# Patient Record
Sex: Female | Born: 1950 | Race: Black or African American | Hispanic: No | Smoking: Never smoker
Health system: Southern US, Community
[De-identification: ages and names within clinical notes are randomized; demographics above are authoritative.]

## PROBLEM LIST (undated history)

## (undated) DIAGNOSIS — K5902 Outlet dysfunction constipation: Secondary | ICD-10-CM

## (undated) DIAGNOSIS — E785 Hyperlipidemia, unspecified: Secondary | ICD-10-CM

## (undated) DIAGNOSIS — I10 Essential (primary) hypertension: Secondary | ICD-10-CM

## (undated) DIAGNOSIS — M1990 Unspecified osteoarthritis, unspecified site: Secondary | ICD-10-CM

## (undated) DIAGNOSIS — K6289 Other specified diseases of anus and rectum: Secondary | ICD-10-CM

## (undated) HISTORY — DX: Essential (primary) hypertension: I10

## (undated) HISTORY — DX: Unspecified osteoarthritis, unspecified site: M19.90

## (undated) HISTORY — DX: Other specified diseases of anus and rectum: K62.89

## (undated) HISTORY — DX: Hyperlipidemia, unspecified: E78.5

## (undated) HISTORY — DX: Outlet dysfunction constipation: K59.02

## (undated) HISTORY — PX: POLYPECTOMY: SHX149

## (undated) HISTORY — PX: COLONOSCOPY: SHX174

---

## 1995-09-27 HISTORY — PX: ABDOMINAL HYSTERECTOMY: SHX81

## 2000-09-26 HISTORY — PX: BREAST SURGERY: SHX581

## 2011-03-27 DEATH — deceased

## 2017-09-24 DIAGNOSIS — E860 Dehydration: Secondary | ICD-10-CM | POA: Diagnosis not present

## 2017-09-24 DIAGNOSIS — E785 Hyperlipidemia, unspecified: Secondary | ICD-10-CM | POA: Diagnosis not present

## 2017-09-24 DIAGNOSIS — I10 Essential (primary) hypertension: Secondary | ICD-10-CM | POA: Diagnosis not present

## 2017-09-24 DIAGNOSIS — R0789 Other chest pain: Secondary | ICD-10-CM | POA: Diagnosis not present

## 2017-09-24 DIAGNOSIS — E876 Hypokalemia: Secondary | ICD-10-CM | POA: Diagnosis not present

## 2017-09-24 DIAGNOSIS — R079 Chest pain, unspecified: Secondary | ICD-10-CM | POA: Diagnosis not present

## 2017-10-02 DIAGNOSIS — I10 Essential (primary) hypertension: Secondary | ICD-10-CM | POA: Diagnosis not present

## 2017-10-02 DIAGNOSIS — Z136 Encounter for screening for cardiovascular disorders: Secondary | ICD-10-CM | POA: Diagnosis not present

## 2017-10-02 DIAGNOSIS — E785 Hyperlipidemia, unspecified: Secondary | ICD-10-CM | POA: Diagnosis not present

## 2017-10-02 DIAGNOSIS — I209 Angina pectoris, unspecified: Secondary | ICD-10-CM | POA: Diagnosis not present

## 2017-10-02 DIAGNOSIS — R911 Solitary pulmonary nodule: Secondary | ICD-10-CM | POA: Diagnosis not present

## 2017-10-09 DIAGNOSIS — I10 Essential (primary) hypertension: Secondary | ICD-10-CM | POA: Diagnosis not present

## 2017-10-09 DIAGNOSIS — I209 Angina pectoris, unspecified: Secondary | ICD-10-CM | POA: Diagnosis not present

## 2017-10-09 DIAGNOSIS — R911 Solitary pulmonary nodule: Secondary | ICD-10-CM | POA: Diagnosis not present

## 2017-10-10 DIAGNOSIS — Z136 Encounter for screening for cardiovascular disorders: Secondary | ICD-10-CM | POA: Diagnosis not present

## 2017-10-10 DIAGNOSIS — I209 Angina pectoris, unspecified: Secondary | ICD-10-CM | POA: Diagnosis not present

## 2017-10-10 DIAGNOSIS — I10 Essential (primary) hypertension: Secondary | ICD-10-CM | POA: Diagnosis not present

## 2017-10-18 DIAGNOSIS — R918 Other nonspecific abnormal finding of lung field: Secondary | ICD-10-CM | POA: Diagnosis not present

## 2017-10-18 DIAGNOSIS — R05 Cough: Secondary | ICD-10-CM | POA: Diagnosis not present

## 2017-10-22 DIAGNOSIS — S91342A Puncture wound with foreign body, left foot, initial encounter: Secondary | ICD-10-CM | POA: Diagnosis not present

## 2017-11-01 DIAGNOSIS — R911 Solitary pulmonary nodule: Secondary | ICD-10-CM | POA: Diagnosis not present

## 2017-11-01 DIAGNOSIS — E785 Hyperlipidemia, unspecified: Secondary | ICD-10-CM | POA: Diagnosis not present

## 2017-11-01 DIAGNOSIS — I340 Nonrheumatic mitral (valve) insufficiency: Secondary | ICD-10-CM | POA: Diagnosis not present

## 2017-11-01 DIAGNOSIS — I10 Essential (primary) hypertension: Secondary | ICD-10-CM | POA: Diagnosis not present

## 2017-12-22 ENCOUNTER — Ambulatory Visit (INDEPENDENT_AMBULATORY_CARE_PROVIDER_SITE_OTHER): Payer: Medicare Other | Admitting: Physician Assistant

## 2017-12-22 ENCOUNTER — Emergency Department (HOSPITAL_COMMUNITY)
Admission: EM | Admit: 2017-12-22 | Discharge: 2017-12-22 | Disposition: A | Discharge status: Home / Self Care | Payer: Medicare Other | Attending: Emergency Medicine | Admitting: Emergency Medicine

## 2017-12-22 ENCOUNTER — Emergency Department (HOSPITAL_COMMUNITY): Payer: Medicare Other

## 2017-12-22 ENCOUNTER — Encounter: Payer: Self-pay | Admitting: Physician Assistant

## 2017-12-22 ENCOUNTER — Other Ambulatory Visit: Payer: Self-pay

## 2017-12-22 ENCOUNTER — Encounter (HOSPITAL_COMMUNITY): Payer: Self-pay

## 2017-12-22 VITALS — BP 176/100 | HR 62 | Temp 97.7°F | Resp 16

## 2017-12-22 DIAGNOSIS — I10 Essential (primary) hypertension: Secondary | ICD-10-CM | POA: Diagnosis not present

## 2017-12-22 DIAGNOSIS — R001 Bradycardia, unspecified: Secondary | ICD-10-CM | POA: Diagnosis not present

## 2017-12-22 DIAGNOSIS — R030 Elevated blood-pressure reading, without diagnosis of hypertension: Secondary | ICD-10-CM | POA: Diagnosis not present

## 2017-12-22 DIAGNOSIS — R51 Headache: Secondary | ICD-10-CM

## 2017-12-22 DIAGNOSIS — R42 Dizziness and giddiness: Secondary | ICD-10-CM | POA: Diagnosis not present

## 2017-12-22 DIAGNOSIS — R110 Nausea: Secondary | ICD-10-CM | POA: Insufficient documentation

## 2017-12-22 DIAGNOSIS — Z79899 Other long term (current) drug therapy: Secondary | ICD-10-CM | POA: Diagnosis not present

## 2017-12-22 DIAGNOSIS — R519 Headache, unspecified: Secondary | ICD-10-CM

## 2017-12-22 DIAGNOSIS — R531 Weakness: Secondary | ICD-10-CM | POA: Diagnosis not present

## 2017-12-22 DIAGNOSIS — R404 Transient alteration of awareness: Secondary | ICD-10-CM | POA: Diagnosis not present

## 2017-12-22 DIAGNOSIS — R55 Syncope and collapse: Secondary | ICD-10-CM | POA: Diagnosis not present

## 2017-12-22 LAB — URINALYSIS, ROUTINE W REFLEX MICROSCOPIC
Bilirubin Urine: NEGATIVE
GLUCOSE, UA: NEGATIVE mg/dL
HGB URINE DIPSTICK: NEGATIVE
Ketones, ur: NEGATIVE mg/dL
Leukocytes, UA: NEGATIVE
Nitrite: NEGATIVE
PH: 8 (ref 5.0–8.0)
Protein, ur: NEGATIVE mg/dL
SPECIFIC GRAVITY, URINE: 1.005 (ref 1.005–1.030)

## 2017-12-22 LAB — CBC WITH DIFFERENTIAL/PLATELET
BASOS PCT: 0 %
Basophils Absolute: 0 10*3/uL (ref 0.0–0.1)
Eosinophils Absolute: 0.1 10*3/uL (ref 0.0–0.7)
Eosinophils Relative: 1 %
HEMATOCRIT: 38.8 % (ref 36.0–46.0)
Hemoglobin: 12.9 g/dL (ref 12.0–15.0)
LYMPHS ABS: 2.2 10*3/uL (ref 0.7–4.0)
Lymphocytes Relative: 31 %
MCH: 28.1 pg (ref 26.0–34.0)
MCHC: 33.2 g/dL (ref 30.0–36.0)
MCV: 84.5 fL (ref 78.0–100.0)
MONO ABS: 0.3 10*3/uL (ref 0.1–1.0)
MONOS PCT: 4 %
Neutro Abs: 4.4 10*3/uL (ref 1.7–7.7)
Neutrophils Relative %: 64 %
Platelets: 208 10*3/uL (ref 150–400)
RBC: 4.59 MIL/uL (ref 3.87–5.11)
RDW: 13.8 % (ref 11.5–15.5)
WBC: 7 10*3/uL (ref 4.0–10.5)

## 2017-12-22 LAB — BASIC METABOLIC PANEL
Anion gap: 10 (ref 5–15)
BUN: 12 mg/dL (ref 6–20)
CALCIUM: 9.8 mg/dL (ref 8.9–10.3)
CHLORIDE: 107 mmol/L (ref 101–111)
CO2: 24 mmol/L (ref 22–32)
CREATININE: 1.05 mg/dL — AB (ref 0.44–1.00)
GFR calc Af Amer: >60 mL/min (ref >60)
GFR calc non Af Amer: 54 mL/min — ABNORMAL LOW (ref >60)
GLUCOSE: 95 mg/dL (ref 65–99)
Potassium: 4 mmol/L (ref 3.5–5.1)
Sodium: 141 mmol/L (ref 135–145)

## 2017-12-22 LAB — POCT CBC
Granulocyte percent: 57.8 % (ref 37–80)
HCT, POC: 38.1 % (ref 37.7–47.9)
Hemoglobin: 12.7 g/dL (ref 12.2–16.2)
Lymph, poc: 2.6 (ref 0.6–3.4)
MCH, POC: 28.1 pg (ref 27–31.2)
MCHC: 33.2 g/dL (ref 31.8–35.4)
MCV: 84.4 fL (ref 80–97)
MID (cbc): 0.4 (ref 0–0.9)
MPV: 9 fL (ref 0–99.8)
POC Granulocyte: 4.1 (ref 2–6.9)
POC LYMPH PERCENT: 36.9 %L (ref 10–50)
POC MID %: 5.3 %M (ref 0–12)
Platelet Count, POC: 195 10*3/uL (ref 142–424)
RBC: 4.52 M/uL (ref 4.04–5.48)
RDW, POC: 14 %
WBC: 7.1 10*3/uL (ref 4.6–10.2)

## 2017-12-22 LAB — GLUCOSE, POCT (MANUAL RESULT ENTRY): POC Glucose: 87 mg/dl (ref 70–99)

## 2017-12-22 LAB — TROPONIN I: Troponin I: <0.03 ng/mL (ref <0.03)

## 2017-12-22 MED ORDER — MECLIZINE HCL 25 MG PO TABS
25.0000 mg | ORAL_TABLET | Freq: Once | ORAL | Status: AC
Start: 2017-12-22 — End: 2017-12-22
  Administered 2017-12-22: 25 mg via ORAL
  Filled 2017-12-22: qty 1

## 2017-12-22 MED ORDER — LOSARTAN POTASSIUM 50 MG PO TABS: 50.0000 mg | ORAL_TABLET | Freq: Every day | ORAL | 0 refills | Status: DC

## 2017-12-22 MED ORDER — SODIUM CHLORIDE 0.9 % IV BOLUS
1000.0000 mL | Freq: Once | INTRAVENOUS | Status: AC
  Administered 2017-12-22: 1000 mL via INTRAVENOUS

## 2017-12-22 MED ORDER — SODIUM CHLORIDE 0.9 % IV SOLN: INTRAVENOUS | Status: DC

## 2017-12-22 MED ORDER — ONDANSETRON HCL 4 MG/2ML IJ SOLN
4.0000 mg | Freq: Once | INTRAMUSCULAR | Status: AC
  Administered 2017-12-22: 4 mg via INTRAVENOUS
  Filled 2017-12-22: qty 2

## 2017-12-22 MED ORDER — CLONIDINE HCL 0.1 MG PO TABS: 0.1000 mg | ORAL_TABLET | Freq: Every day | ORAL | 0 refills | Status: DC | PRN

## 2017-12-22 MED ORDER — MECLIZINE HCL 25 MG PO TABS: 25.0000 mg | ORAL_TABLET | Freq: Three times a day (TID) | ORAL | 0 refills | Status: DC | PRN

## 2017-12-22 MED ORDER — ONDANSETRON 4 MG PO TBDP: 4.0000 mg | ORAL_TABLET | Freq: Three times a day (TID) | ORAL | 0 refills | Status: DC | PRN

## 2017-12-22 NOTE — Patient Instructions (Signed)
     IF you received an x-ray today, you will receive an invoice from [facility]. Please contact [facility] at [phone] with questions or concerns regarding your invoice.   IF you received labwork today, you will receive an invoice from [facility]. Please contact [facility] at [phone] with questions or concerns regarding your invoice.   Our billing staff will not be able to assist you with questions regarding bills from these companies.  You will be contacted with the lab results as soon as they are available. The fastest way to get your results is to activate your My Chart account. Instructions are located on the last page of this paperwork. If you have not heard from us regarding the results in 2 weeks, please contact this office.     

## 2017-12-22 NOTE — ED Provider Notes (Signed)
[facility] EMERGENCY DEPARTMENT Provider Note   CSN: [id number] Arrival date & time: 12/22/17  1157     History   Chief Complaint Chief Complaint  Patient presents with  . Loss of Consciousness    HPI Nancy Christensen is a 67 y.o. female.  Pt presents to the ED today with dizziness and possible syncopal episode.  She said she had an episode on Tuesday, March 25 of severe dizziness.  She had to lie on the ground and not move.  She said it lasted about 15 minutes then finally resolved.  She was fine Wed and Thursday.  This morning, she woke up in the night (around 0230) and felt the same sx.  She had nausea with it. She said it was so severe that she started praying.  She is not sure if she fell back asleep, or if she passed out.  She woke up a few hours later still feeling dizzy, but not as bad.  She went to urgent care who sent her here.  Dizziness is improved, but still there a little.  Nausea is also still there a little.  She denies f/c.  She denies any difficulty with her speech.  She has recently moved here from [place], [place] and needs refills on her meds.     Past Medical History:  Diagnosis Date  . Hyperlipidemia   . Hypertension     There are no active problems to display for this patient.     OB History   None      Home Medications    Prior to Admission medications   Medication Sig Start Date End Date Taking? Authorizing Provider  atorvastatin (LIPITOR) 40 MG tablet Take 40 mg by mouth daily.    [provider]  cloNIDine (CATAPRES) 0.1 MG tablet Take 1 tablet (0.1 mg total) by mouth daily as needed (SBP more than 170 or DBP > 90). 12/22/17   Isla Pence, MD  losartan (COZAAR) 50 MG tablet Take 1 tablet (50 mg total) by mouth daily. 12/22/17   Isla Pence, MD  meclizine (ANTIVERT) 25 MG tablet Take 1 tablet (25 mg total) by mouth 3 (three) times daily as needed for dizziness. 12/22/17   Isla Pence, MD  ondansetron (ZOFRAN  ODT) 4 MG disintegrating tablet Take 1 tablet (4 mg total) by mouth every 8 (eight) hours as needed. 12/22/17   Isla Pence, MD    Family History No family history on file.  Social History Social History   Tobacco Use  . Smoking status: Never Smoker  . Smokeless tobacco: Never Used  Substance Use Topics  . Alcohol use: Not on file  . Drug use: Not on file     Allergies   Patient has no known allergies.   Review of Systems Review of Systems  Gastrointestinal: Positive for nausea.  Neurological: Positive for dizziness.  All other systems reviewed and are negative.    Physical Exam Updated Vital Signs BP (!) 202/95 (BP Location: Left Arm)   Pulse 60   Temp 98.4 F (36.9 C) (Oral)   Resp 14   SpO2 100%   Physical Exam  Constitutional: She is oriented to person, place, and time. She appears well-developed and well-nourished.  HENT:  Head: Normocephalic and atraumatic.  Right Ear: External ear normal.  Left Ear: External ear normal.  Nose: Nose normal.  Mouth/Throat: Oropharynx is clear and moist.  Eyes: Pupils are equal, round, and reactive to light. Conjunctivae and EOM are normal.  Neck: Normal range of motion. Neck supple.  Cardiovascular: Normal rate, regular rhythm, normal heart sounds and intact distal pulses.  Pulmonary/Chest: Effort normal and breath sounds normal.  Abdominal: Soft. Bowel sounds are normal.  Musculoskeletal: Normal range of motion.  Neurological: She is alert and oriented to person, place, and time.  Skin: Skin is warm. Capillary refill takes less than 2 seconds.  Psychiatric: She has a normal mood and affect. Her behavior is normal. Judgment and thought content normal.  Nursing note and vitals reviewed.    ED Treatments / Results  Labs (all labs ordered are listed, but only abnormal results are displayed) Labs Reviewed  BASIC METABOLIC PANEL - Abnormal; Notable for the following components:      Result Value   Creatinine, Ser  1.05 (*)    GFR calc non Af Amer 54 (*)    All other components within normal limits  URINALYSIS, ROUTINE W REFLEX MICROSCOPIC - Abnormal; Notable for the following components:   Color, Urine COLORLESS (*)    All other components within normal limits  CBC WITH DIFFERENTIAL/PLATELET  TROPONIN I  CBG MONITORING, ED    EKG EKG Interpretation  Date/Time:  Friday December 22 2017 12:14:41 EDT Ventricular Rate:  56 PR Interval:    QRS Duration: 100 QT Interval:  461 QTC Calculation: 445 R Axis:   29 Text Interpretation:  Sinus rhythm Confirmed by Isla Pence [id number]) on 12/22/2017 12:18:56 PM   Radiology Dg Chest 2 View  Result Date: 12/22/2017 CLINICAL DATA:  Syncopal episode today.  Dizzy. EXAM: CHEST - 2 VIEW COMPARISON:  None. FINDINGS: The heart size and mediastinal contours are within normal limits. Both lungs are clear. The visualized skeletal structures are unremarkable. IMPRESSION: No active cardiopulmonary disease. Electronically Signed   By: San Morelle M.D.   On: 12/22/2017 12:54   Ct Head Wo Contrast  Result Date: 12/22/2017 CLINICAL DATA:  Headache and dizziness beginning 3 days ago. EXAM: CT HEAD WITHOUT CONTRAST TECHNIQUE: Contiguous axial images were obtained from the base of the skull through the vertex without intravenous contrast. COMPARISON:  None. FINDINGS: Brain: There is no evidence of acute cortically based infarct, intracranial hemorrhage, mass, midline shift, or extra-axial fluid collection. The ventricles and sulci are normal. Periventricular white matter hypodensities are nonspecific but compatible with mild chronic small vessel ischemic disease. Patchy hypodensities are also present in the basal ganglia bilaterally and suggest small vessel ischemia/lacunar infarcts of indeterminate acuity. Vascular: Calcified atherosclerosis at the skull base. No hyperdense vessel. Skull: No fracture focal osseous lesion. Sinuses/Orbits: Visualized paranasal sinuses and  mastoid air cells are clear. Orbits are unremarkable. Other: None. IMPRESSION: 1. No intracranial hemorrhage or acute large territory infarct. 2. Small vessel ischemia in the cerebral white matter and basal ganglia, of indeterminate acuity though most likely chronic. Electronically Signed   By: Logan Bores M.D.   On: 12/22/2017 13:52    Procedures Procedures (including critical care time)  Medications Ordered in ED Medications  sodium chloride 0.9 % bolus 1,000 mL (0 mLs Intravenous Stopped 12/22/17 1421)    And  0.9 %  sodium chloride infusion (has no administration in time range)  ondansetron (ZOFRAN) injection 4 mg (4 mg Intravenous Given 12/22/17 1319)  meclizine (ANTIVERT) tablet 25 mg (25 mg Oral Given 12/22/17 1319)     Initial Impression / Assessment and Plan / ED Course  I have reviewed the triage vital signs and the nursing notes.  Pertinent labs & imaging results that were available during  my care of the patient were reviewed by me and considered in my medical decision making (see chart for details).    Pt is feeling much better.  She is able to ambulate without problems.  She knows to establish primary care and to return if worse.    Final Clinical Impressions(s) / ED Diagnoses   Final diagnoses:  Vertigo  Essential hypertension    ED Discharge Orders        Ordered    losartan (COZAAR) 50 MG tablet  Daily     12/22/17 1506    ondansetron (ZOFRAN ODT) 4 MG disintegrating tablet  Every 8 hours PRN     12/22/17 1506    meclizine (ANTIVERT) 25 MG tablet  3 times daily PRN     12/22/17 1506    cloNIDine (CATAPRES) 0.1 MG tablet  Daily PRN     12/22/17 1506       Isla Pence, MD 12/22/17 1508

## 2017-12-22 NOTE — ED Triage Notes (Signed)
Pt presents to the ed with complaints of syncopal episode this morning at 0230.  Reports dizziness and blurred vision prior to.  Reports headache and dizziness now, alert and oriented. Elevated blood pressure with ems. Denies any injury from syncopal episode.

## 2017-12-22 NOTE — ED Notes (Signed)
Pt able to walk down hall , no dizziness

## 2017-12-22 NOTE — Progress Notes (Signed)
Nancy Christensen  MRN: [id number] DOB: [birth year]  PCP: Patient, No Pcp Per  Subjective:  Pt is a pleasant 67 year old female who presents to clinic for headache and dizziness x 7 hours.  She recently moved here from [place].  She woke up around 2:30 this morning because her phone woke her up. While she was looking at her phone she became extremely dizzy.  Felt like the room was spinning.  She wanted to get up to use the restroom however could not due to dizziness "felt like the bad was spinning".  This episode lasted for about 20 minutes. Endorses right sided headache along with nausea.  No vomiting.  She cannot recall if she fell back asleep or passed out.  "I assume I blacked out." It was sometime later and my head still hurt, "nagging, dull and constant". Her blood pressure today is 191/124. Recheck is 177/116 and 177/94.   HA is still present.  Symptoms are worse when she moves her head. nausea has resolved.  Denies chest pian, vomiting, one-sided weakness, ataxic gait, droopy face, drooling, difficulty swallowing.    She had a spell like this 3 days ago with a minor fall. No injury. She leaned over to open the blinds and felt dizzy. Episode lasted 20 min. Today's episode is much worse than 3 days ago.   H/o HTN x 2 years - Atorvastatin 40mg  qd and cozaar 50mg  qd. Last dose was 8:20 am. She does not take home BP readings. Last BP check was 6 weeks ago, 132/70 she has been eval by cards 2 months ago "I think they told me I have a leaky valve".   She is a never smoker.   FHx: Mother DM and HTN Father lung cancer Brother HTN  Review of Systems  Constitutional: Negative for diaphoresis and fatigue.  Respiratory: Negative for chest tightness, shortness of breath and wheezing.   Cardiovascular: Negative for chest pain, palpitations and leg swelling.  Gastrointestinal: Positive for nausea. Negative for vomiting.  Neurological: Positive for dizziness and headaches. Negative for  syncope, weakness and light-headedness.  Psychiatric/Behavioral: Positive for sleep disturbance.    There are no active problems to display for this patient.   Current Outpatient Medications on File Prior to Visit  Medication Sig Dispense Refill  . atorvastatin (LIPITOR) 40 MG tablet Take 40 mg by mouth daily.    Marland Kitchen losartan (COZAAR) 50 MG tablet Take 50 mg by mouth daily.     No current facility-administered medications on file prior to visit.     No Known Allergies   Objective:  BP (!) 191/124   Pulse 67   Temp 97.7 F (36.5 C) (Oral)   Resp 16   SpO2 99%   Physical Exam  Constitutional: She is oriented to person, place, and time and well-developed, well-nourished, and in no distress. No distress.  Neck: No JVD present. Carotid bruit is not present.  Cardiovascular: Normal rate, regular rhythm and normal heart sounds.  Neurological: She is alert and oriented to person, place, and time. She has normal motor skills, normal strength, normal reflexes and intact cranial nerves. She displays normal speech. She has a normal Tandem Gait Test. She shows no pronator drift. Gait normal. GCS score is 15.  Skin: Skin is warm and dry.  Psychiatric: Mood, memory, affect and judgment normal.  Vitals reviewed.   Results for orders placed or performed in visit on 12/22/17  POCT glucose (manual entry)  Result Value Ref Range  POC Glucose 87 70 - 99 mg/dl  POCT CBC  Result Value Ref Range   WBC 7.1 4.6 - 10.2 K/uL   Lymph, poc 2.6 0.6 - 3.4   POC LYMPH PERCENT 36.9 10 - 50 %L   MID (cbc) 0.4 0 - 0.9   POC MID % 5.3 0 - 12 %M   POC Granulocyte 4.1 2 - 6.9   Granulocyte percent 57.8 37 - 80 %G   RBC 4.52 4.04 - 5.48 M/uL   Hemoglobin 12.7 12.2 - 16.2 g/dL   HCT, POC 38.1 37.7 - 47.9 %   MCV 84.4 80 - 97 fL   MCH, POC 28.1 27 - 31.2 pg   MCHC 33.2 31.8 - 35.4 g/dL   RDW, POC 14.0 %   Platelet Count, POC 195 142 - 424 K/uL   MPV 9.0 0 - 99.8 fL   EKG - bradycardia, normal sinus  rhythm Assessment and Plan :  1. Dizziness 2. Elevated blood pressure reading 3. Bradycardia 4. Nonintractable headache, unspecified chronicity pattern, unspecified headache type - EKG 12-Lead - POCT glucose (manual entry) - POCT CBC - Recheck vitals - Pt is a 67 year old female PMH HTN who c/o sudden onset dizziness, HA and nausea x 7 hours. Blood pressure today is 191/124, improved to 177/94. Pulse 59. EKG shows bradycardia, otherwise not concerning. Negative neuro exam. CBC and blood sugar wnl. She had a similar, however not as severe, episode 3 days ago. Elevated blood pressure and bradycardia with possible signs of end organ damage, cannot r/o poss CVA vs TIA. Plan to transport to [facility] ED via EMS. Plan discussed with pt. She understands and agrees.   Mercer Pod, PA-C  [facility] 12/22/2017 9:14 AM

## 2017-12-22 NOTE — ED Notes (Signed)
Patient transported to X-ray 

## 2017-12-28 DIAGNOSIS — M25552 Pain in left hip: Secondary | ICD-10-CM | POA: Diagnosis not present

## 2017-12-28 DIAGNOSIS — M25562 Pain in left knee: Secondary | ICD-10-CM | POA: Diagnosis not present

## 2017-12-28 DIAGNOSIS — R030 Elevated blood-pressure reading, without diagnosis of hypertension: Secondary | ICD-10-CM | POA: Diagnosis not present

## 2017-12-28 DIAGNOSIS — R42 Dizziness and giddiness: Secondary | ICD-10-CM | POA: Diagnosis not present

## 2017-12-28 DIAGNOSIS — M25511 Pain in right shoulder: Secondary | ICD-10-CM | POA: Diagnosis not present

## 2017-12-28 DIAGNOSIS — F458 Other somatoform disorders: Secondary | ICD-10-CM | POA: Diagnosis not present

## 2017-12-28 DIAGNOSIS — G8918 Other acute postprocedural pain: Secondary | ICD-10-CM | POA: Diagnosis not present

## 2018-01-03 ENCOUNTER — Ambulatory Visit (INDEPENDENT_AMBULATORY_CARE_PROVIDER_SITE_OTHER): Payer: Medicare Other | Admitting: Family Medicine

## 2018-01-03 ENCOUNTER — Encounter: Payer: Self-pay | Admitting: Family Medicine

## 2018-01-03 VITALS — BP 120/90 | HR 57 | Temp 98.2°F | Ht 63.5 in | Wt 215.5 lb

## 2018-01-03 DIAGNOSIS — M1990 Unspecified osteoarthritis, unspecified site: Secondary | ICD-10-CM | POA: Diagnosis not present

## 2018-01-03 DIAGNOSIS — Z7689 Persons encountering health services in other specified circumstances: Secondary | ICD-10-CM | POA: Diagnosis not present

## 2018-01-03 DIAGNOSIS — E785 Hyperlipidemia, unspecified: Secondary | ICD-10-CM | POA: Diagnosis not present

## 2018-01-03 DIAGNOSIS — I10 Essential (primary) hypertension: Secondary | ICD-10-CM

## 2018-01-03 DIAGNOSIS — R911 Solitary pulmonary nodule: Secondary | ICD-10-CM | POA: Diagnosis not present

## 2018-01-03 NOTE — Progress Notes (Signed)
Patient presents to clinic today to establish care.  SUBJECTIVE: PMH:  Pt is a 67 yo female with pmh sig for HTN, arthritis, HLD, migraines.  She was previously seen in [place].  Vertigo: -Patient endorses recent episode on 12/22/17 -Was seen by UC and ED for same. -Patient was given Rx for meclizine.  She has not had to take any at this point, but carries it with her just in case -Patient endorses drinking 516.9 ounce bottles of water per day.  HTN: -Diagnosed 4 years ago -Patient was recently seen by UC then transferred to the ED for vertigo and hypertension. -Patient is restarted on regular BP meds, losartan 50 mg daily.  Patient was also told to take clonidine 0.1 mg as needed for BP greater than 170/90 -Patient states she is only had to use the clonidine twice but it made her "feel bad". -Patient endorses drinking 5 16.9 ounce bottles of water per day. -BP has been 180/95, 176/70, 152/68.  HLD: -Patient has been taking Lipitor 40 mg daily. -Patient states she is recently been out of medication times 3 weeks. -Patient does not recall last cholesterol check -Patient endorses myalgias  Lung nodule: -Incidental finding of on x-ray -Patient was seen by pulmonologist in January. -Patient was advised to follow-up in 6 months, but moved after that initial appointment. -Patient denies smoking history.  Allergies: NKDA  Past surgical history: Breast biopsy 2002 Hysterectomy 2/2 fibroids in 1997.  One ovary was left  Social history: Patient is divorced.  She has 1 child.  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  Patient recently relocated to [place] area after spending some time in [place] with her daughter.  Patient is originally from [place].  Patient denies alcohol, tobacco, drug use.  Family medical history: Mom-deceased, arthritis, diabetes, HLD, HTN, kidney disease, stroke Dad-deceased, lung cancer, tobacco  use Sister-alive, kidney disease status post renal transplant Brother-James, alive, HLD [employer]-deceased, brain cancer, stroke MGF-deceased, lung cancer, HTN  Health Maintenance: Colonoscopy --2015 a few polyps noted and removed. Mammogram --06/2017 PAP --2017 Bone Density --unsure   Past Medical History:  Diagnosis Date  . Hyperlipidemia   . Hypertension     Current Outpatient Medications on File Prior to Visit  Medication Sig Dispense Refill  . atorvastatin (LIPITOR) 40 MG tablet Take 40 mg by mouth daily.    . cloNIDine (CATAPRES) 0.1 MG tablet Take 1 tablet (0.1 mg total) by mouth daily as needed (SBP more than 170 or DBP > 90). 30 tablet 0  . losartan (COZAAR) 50 MG tablet Take 1 tablet (50 mg total) by mouth daily. 30 tablet 0  . meclizine (ANTIVERT) 25 MG tablet Take 1 tablet (25 mg total) by mouth 3 (three) times daily as needed for dizziness. (Patient not taking: Reported on 01/03/2018) 30 tablet 0  . ondansetron (ZOFRAN ODT) 4 MG disintegrating tablet Take 1 tablet (4 mg total) by mouth every 8 (eight) hours as needed. (Patient not taking: Reported on 01/03/2018) 10 tablet 0   No current facility-administered medications on file prior to visit.     No Known Allergies  No family history on file.  Social History   Socioeconomic History  . Marital status: Divorced    Spouse name: Not on file  . Number of children: Not on file  . Years of education: Not on file  . Highest education level: Not on file  Occupational History  . Not on file  Social Needs  . Financial resource strain: Not on file  . Food insecurity:    Worry: Not on file    Inability: Not on file  . Transportation needs:    Medical: Not on file    Non-medical: Not on file  Tobacco Use  . Smoking status: Never Smoker  . Smokeless tobacco: Never Used  Substance and Sexual Activity  . Alcohol use: Not on file  . Drug use: Not on file  . Sexual activity: Not on file  Lifestyle  . Physical activity:     Days per week: Not on file    Minutes per session: Not on file  . Stress: Not on file  Relationships  . Social connections:    Talks on phone: Not on file    Gets together: Not on file    Attends religious service: Not on file    Active member of club or organization: Not on file    Attends meetings of clubs or organizations: Not on file    Relationship status: Not on file  . Intimate partner violence:    Fear of current or ex partner: Not on file    Emotionally abused: Not on file    Physically abused: Not on file    Forced sexual activity: Not on file  Other Topics Concern  . Not on file  Social History Narrative  . Not on file    ROS General: Denies fever, chills, night sweats, changes in weight, changes in appetite HEENT: Denies headaches, ear pain, changes in vision, rhinorrhea, sore throat CV: Denies CP, palpitations, SOB, orthopnea Pulm: Denies SOB, cough, wheezing GI: Denies abdominal pain, nausea, vomiting, diarrhea, constipation GU: Denies dysuria, hematuria, frequency, vaginal discharge Msk: Denies muscle cramps, joint pains Neuro: Denies weakness, numbness, tingling Skin: Denies rashes, bruising Psych: Denies depression, anxiety, hallucinations  BP 120/90 (BP Location: Left Arm, Patient Position: Sitting, Cuff Size: Large)   Pulse (!) 57   Temp 98.2 F (36.8 C) (Oral)   Ht 5' 3.5" (1.613 m)   Wt 215 lb 8 oz (97.8 kg)   SpO2 98%   BMI 37.58 kg/m   Physical Exam Gen. Pleasant, well developed, well-nourished, in NAD HEENT - [place]/AT, PERRL, no scleral icterus, no nasal drainage, pharynx without erythema or exudate. Neck: No JVD, no thyromegaly, faint carotid bruits Lungs: no use of accessory muscles, CTAB, no wheezes, rales or rhonchi Cardiovascular: RRR,  No r/g/m, no peripheral edema Abdomen: BS present, soft, nontender, nondistended, no hepatosplenomegaly Neuro:  A&Ox3, CN II-XII intact, normal gait Skin:  Warm, dry, intact, no lesions Psych: normal  affect, mood appropriate  Recent Results (from the past 2160 hour(s))  POCT glucose (manual entry)     Status: Normal   Collection Time: 12/22/17  9:35 AM  Result Value Ref Range   POC Glucose 87 70 - 99 mg/dl  POCT CBC     Status: Normal   Collection Time: 12/22/17  9:35 AM  Result Value Ref Range   WBC 7.1 4.6 - 10.2 K/uL   Lymph, poc 2.6 0.6 - 3.4   POC LYMPH PERCENT 36.9 10 - 50 %L   MID (cbc) 0.4 0 - 0.9   POC MID % 5.3 0 - 12 %M   POC Granulocyte 4.1 2 - 6.9   Granulocyte percent 57.8 37 - 80 %G   RBC 4.52 4.04 - 5.48 M/uL   Hemoglobin 12.7 12.2 - 16.2 g/dL   HCT, POC 38.1 37.7 - 47.9 %  MCV 84.4 80 - 97 fL   MCH, POC 28.1 27 - 31.2 pg   MCHC 33.2 31.8 - 35.4 g/dL   RDW, POC 14.0 %   Platelet Count, POC 195 142 - 424 K/uL   MPV 9.0 0 - 99.8 fL  Basic metabolic panel     Status: Abnormal   Collection Time: 12/22/17 12:20 PM  Result Value Ref Range   Sodium 141 135 - 145 mmol/L   Potassium 4.0 3.5 - 5.1 mmol/L   Chloride 107 101 - 111 mmol/L   CO2 24 22 - 32 mmol/L   Glucose, Bld 95 65 - 99 mg/dL   BUN 12 6 - 20 mg/dL   Creatinine, Ser 1.05 (H) 0.44 - 1.00 mg/dL   Calcium 9.8 8.9 - 10.3 mg/dL   GFR calc non Af Amer 54 (L) >60 mL/min   GFR calc Af Amer >60 >60 mL/min    Comment: (NOTE) The eGFR has been calculated using the CKD EPI equation. This calculation has not been validated in all clinical situations. eGFR's persistently <60 mL/min signify possible Chronic Kidney Disease.    Anion gap 10 5 - 15    Comment: Performed at [facility] [address] [place], [place] [address]  CBC WITH DIFFERENTIAL     Status: None   Collection Time: 12/22/17 12:20 PM  Result Value Ref Range   WBC 7.0 4.0 - 10.5 K/uL   RBC 4.59 3.87 - 5.11 MIL/uL   Hemoglobin 12.9 12.0 - 15.0 g/dL   HCT 38.8 36.0 - 46.0 %   MCV 84.5 78.0 - 100.0 fL   MCH 28.1 26.0 - 34.0 pg   MCHC 33.2 30.0 - 36.0 g/dL   RDW 13.8 11.5 - 15.5 %   Platelets 208 150 - 400 K/uL   Neutrophils  Relative % 64 %   Neutro Abs 4.4 1.7 - 7.7 K/uL   Lymphocytes Relative 31 %   Lymphs Abs 2.2 0.7 - 4.0 K/uL   Monocytes Relative 4 %   Monocytes Absolute 0.3 0.1 - 1.0 K/uL   Eosinophils Relative 1 %   Eosinophils Absolute 0.1 0.0 - 0.7 K/uL   Basophils Relative 0 %   Basophils Absolute 0.0 0.0 - 0.1 K/uL    Comment: Performed at [facility] [address] [place], [place] [address]  Troponin I     Status: None   Collection Time: 12/22/17 12:20 PM  Result Value Ref Range   Troponin I <0.03 <0.03 ng/mL    Comment: Performed at [facility] [address], [place], [place] [address]  Urinalysis, Routine w reflex microscopic     Status: Abnormal   Collection Time: 12/22/17 12:59 PM  Result Value Ref Range   Color, Urine COLORLESS (A) YELLOW   APPearance CLEAR CLEAR   Specific Gravity, Urine 1.005 1.005 - 1.030   pH 8.0 5.0 - 8.0   Glucose, UA NEGATIVE NEGATIVE mg/dL   Hgb urine dipstick NEGATIVE NEGATIVE   Bilirubin Urine NEGATIVE NEGATIVE   Ketones, ur NEGATIVE NEGATIVE mg/dL   Protein, ur NEGATIVE NEGATIVE mg/dL   Nitrite NEGATIVE NEGATIVE   Leukocytes, UA NEGATIVE NEGATIVE    Comment: Performed at [facility] [address], [place], [place] [address]    Assessment/Plan: Essential hypertension -Patient encouraged to continue checking BP at home. -Lifestyle modifications encouraged including increasing physical activity, increasing p.o. intake of water, decreasing sodium intake. -Patient given handouts -Patient to continue losartan 50  mg daily. -Discussed the potential for rebound hypertension with sporadic use of clonidine. -Patient to return to clinic in 1 week for BP recheck.  Patient is also to bring her blood pressure cuffs so they can be checked for accuracy.  If BP remains elevated at this time will adjust blood pressure medication. -Patient given RTC or ED precautions including headache, changes in vision, chest pain, nausea, vomiting,  etc.  Arthritis  Lung nodule -Patient has labs regarding this with her.  Advised to take them to her appointment with pulmonology. - Plan: Ambulatory referral to Pulmonology  Hyperlipidemia, unspecified hyperlipidemia type -Given recent myalgias we will have patient hold Lipitor 40 mg to see if myalgias resolve. -Lifestyle modifications encouraged including eating healthy fish, decreasing the amount of saturated fats, using all of oil instead of water, increasing physical activity -Recheck lipids in the next few months.  Encounter to establish care -We reviewed the PMH, PSH, FH, SH, Meds and Allergies. -We provided refills for any medications we will prescribe as needed. -We addressed current concerns per orders and patient instructions. -We have asked for records for pertinent exams, studies, vaccines and notes from previous providers. -We have advised patient to follow up per instructions below.   F/u in the next few months for CPE.  Sooner if needed.  Grier Mitts, MD

## 2018-01-03 NOTE — Patient Instructions (Addendum)
Clonidine tablets What is this medicine? CLONIDINE (KLOE ni deen) is used to treat high blood pressure. This medicine may be used for other purposes; ask your health care provider or pharmacist if you have questions. COMMON BRAND NAME(S): Catapres What should I tell my health care provider before I take this medicine? They need to know if you have any of these conditions: -kidney disease -an unusual or allergic reaction to clonidine, other medicines, foods, dyes, or preservatives -pregnant or trying to get pregnant -breast-feeding How should I use this medicine? Take this medicine by mouth with a glass of water. Follow the directions on the prescription label. Take your doses at regular intervals. Do not take your medicine more often than directed. Do not suddenly stop taking this medicine. You must gradually reduce the dose or you may get a dangerous increase in blood pressure. Ask your doctor or health care professional for advice. Talk to your pediatrician regarding the use of this medicine in children. Special care may be needed. Overdosage: If you think you have taken too much of this medicine contact a poison control center or emergency room at once. NOTE: This medicine is only for you. Do not share this medicine with others. What if I miss a dose? If you miss a dose, take it as soon as you can. If it is almost time for your next dose, take only that dose. Do not take double or extra doses. What may interact with this medicine? Do not take this medicine with any of the following medications: -MAOIs like Carbex, Eldepryl, Marplan, Nardil, and Parnate This medicine may also interact with the following medications: -barbiturate medicines for inducing sleep or treating seizures like phenobarbital -certain medicines for blood pressure, heart disease, irregular heart beat -certain medicines for depression, anxiety, or psychotic disturbances -prescription pain medicines This list may not  describe all possible interactions. Give your health care provider a list of all the medicines, herbs, non-prescription drugs, or dietary supplements you use. Also tell them if you smoke, drink alcohol, or use illegal drugs. Some items may interact with your medicine. What should I watch for while using this medicine? Visit your doctor or health care professional for regular checks on your progress. Check your heart rate and blood pressure regularly while you are taking this medicine. Ask your doctor or health care professional what your heart rate should be and when you should contact him or her. You may get drowsy or dizzy. Do not drive, use machinery, or do anything that needs mental alertness until you know how this medicine affects you. To avoid dizzy or fainting spells, do not stand or sit up quickly, especially if you are an older person. Alcohol can make you more drowsy and dizzy. Avoid alcoholic drinks. Your mouth may get dry. Chewing sugarless gum or sucking hard candy, and drinking plenty of water will help. Do not treat yourself for coughs, colds, or pain while you are taking this medicine without asking your doctor or health care professional for advice. Some ingredients may increase your blood pressure. If you are going to have surgery tell your doctor or health care professional that you are taking this medicine. What side effects may I notice from receiving this medicine? Side effects that you should report to your doctor or health care professional as soon as possible: -allergic reactions like skin rash, itching or hives, swelling of the face, lips, or tongue -anxiety, nervousness -chest pain -depression -fast, irregular heartbeat -swelling of feet or legs -unusually   weak or tired Side effects that usually do not require medical attention (report to your doctor or health care professional if they continue or are bothersome): -change in sex drive or  performance -constipation -headache This list may not describe all possible side effects. Call your doctor for medical advice about side effects. You may report side effects to FDA at 1-800-FDA-1088. Where should I keep my medicine? Keep out of the reach of children. Store at room temperature between 15 and 30 degrees C (59 and 86 degrees F). Protect from light. Keep container tightly closed. Throw away any unused medicine after the expiration date. NOTE: This sheet is a summary. It may not cover all possible information. If you have questions about this medicine, talk to your doctor, pharmacist, or health care provider.  2018 Elsevier/Gold Standard (2011-03-09 13:01:28)  Hypertension Hypertension, commonly called high blood pressure, is when the force of blood pumping through the arteries is too strong. The arteries are the blood vessels that carry blood from the heart throughout the body. Hypertension forces the heart to work harder to pump blood and may cause arteries to become narrow or stiff. Having untreated or uncontrolled hypertension can cause heart attacks, strokes, kidney disease, and other problems. A blood pressure reading consists of a higher number over a lower number. Ideally, your blood pressure should be below 120/80. The first ("top") number is called the systolic pressure. It is a measure of the pressure in your arteries as your heart beats. The second ("bottom") number is called the diastolic pressure. It is a measure of the pressure in your arteries as the heart relaxes. What are the causes? The cause of this condition is not known. What increases the risk? Some risk factors for high blood pressure are under your control. Others are not. Factors you can change  Smoking.  Having type 2 diabetes mellitus, high cholesterol, or both.  Not getting enough exercise or physical activity.  Being overweight.  Having too much fat, sugar, calories, or salt (sodium) in your  diet.  Drinking too much alcohol. Factors that are difficult or impossible to change  Having chronic kidney disease.  Having a family history of high blood pressure.  Age. Risk increases with age.  Race. You may be at higher risk if you are African-American.  Gender. Men are at higher risk than women before age 69. After age 4, women are at higher risk than men.  Having obstructive sleep apnea.  Stress. What are the signs or symptoms? Extremely high blood pressure (hypertensive crisis) may cause:  Headache.  Anxiety.  Shortness of breath.  Nosebleed.  Nausea and vomiting.  Severe chest pain.  Jerky movements you cannot control (seizures).  How is this diagnosed? This condition is diagnosed by measuring your blood pressure while you are seated, with your arm resting on a surface. The cuff of the blood pressure monitor will be placed directly against the skin of your upper arm at the level of your heart. It should be measured at least twice using the same arm. Certain conditions can cause a difference in blood pressure between your right and left arms. Certain factors can cause blood pressure readings to be lower or higher than normal (elevated) for a short period of time:  When your blood pressure is higher when you are in a health care provider's office than when you are at home, this is called white coat hypertension. Most people with this condition do not need medicines.  When your blood pressure  is higher at home than when you are in a health care provider's office, this is called masked hypertension. Most people with this condition may need medicines to control blood pressure.  If you have a high blood pressure reading during one visit or you have normal blood pressure with other risk factors:  You may be asked to return on a different day to have your blood pressure checked again.  You may be asked to monitor your blood pressure at home for 1 week or longer.  If  you are diagnosed with hypertension, you may have other blood or imaging tests to help your health care provider understand your overall risk for other conditions. How is this treated? This condition is treated by making healthy lifestyle changes, such as eating healthy foods, exercising more, and reducing your alcohol intake. Your health care provider may prescribe medicine if lifestyle changes are not enough to get your blood pressure under control, and if:  Your systolic blood pressure is above 130.  Your diastolic blood pressure is above 80.  Your personal target blood pressure may vary depending on your medical conditions, your age, and other factors. Follow these instructions at home: Eating and drinking  Eat a diet that is high in fiber and potassium, and low in sodium, added sugar, and fat. An example eating plan is called the DASH (Dietary Approaches to Stop Hypertension) diet. To eat this way: ? Eat plenty of fresh fruits and vegetables. Try to fill half of your plate at each meal with fruits and vegetables. ? Eat whole grains, such as whole wheat pasta, brown rice, or whole grain bread. Fill about one quarter of your plate with whole grains. ? Eat or drink low-fat dairy products, such as skim milk or low-fat yogurt. ? Avoid fatty cuts of meat, processed or cured meats, and poultry with skin. Fill about one quarter of your plate with lean proteins, such as fish, chicken without skin, beans, eggs, and tofu. ? Avoid premade and processed foods. These tend to be higher in sodium, added sugar, and fat.  Reduce your daily sodium intake. Most people with hypertension should eat less than 1,500 mg of sodium a day.  Limit alcohol intake to no more than 1 drink a day for nonpregnant women and 2 drinks a day for men. One drink equals 12 oz of beer, 5 oz of wine, or 1 oz of hard liquor. Lifestyle  Work with your health care provider to maintain a healthy body weight or to lose weight. Ask  what an ideal weight is for you.  Get at least 30 minutes of exercise that causes your heart to beat faster (aerobic exercise) most days of the week. Activities may include walking, swimming, or biking.  Include exercise to strengthen your muscles (resistance exercise), such as pilates or lifting weights, as part of your weekly exercise routine. Try to do these types of exercises for 30 minutes at least 3 days a week.  Do not use any products that contain nicotine or tobacco, such as cigarettes and e-cigarettes. If you need help quitting, ask your health care provider.  Monitor your blood pressure at home as told by your health care provider.  Keep all follow-up visits as told by your health care provider. This is important. Medicines  Take over-the-counter and prescription medicines only as told by your health care provider. Follow directions carefully. Blood pressure medicines must be taken as prescribed.  Do not skip doses of blood pressure medicine. Doing this  puts you at risk for problems and can make the medicine less effective.  Ask your health care provider about side effects or reactions to medicines that you should watch for. Contact a health care provider if:  You think you are having a reaction to a medicine you are taking.  You have headaches that keep coming back (recurring).  You feel dizzy.  You have swelling in your ankles.  You have trouble with your vision. Get help right away if:  You develop a severe headache or confusion.  You have unusual weakness or numbness.  You feel faint.  You have severe pain in your chest or abdomen.  You vomit repeatedly.  You have trouble breathing. Summary  Hypertension is when the force of blood pumping through your arteries is too strong. If this condition is not controlled, it may put you at risk for serious complications.  Your personal target blood pressure may vary depending on your medical conditions, your age, and  other factors. For most people, a normal blood pressure is less than 120/80.  Hypertension is treated with lifestyle changes, medicines, or a combination of both. Lifestyle changes include weight loss, eating a healthy, low-sodium diet, exercising more, and limiting alcohol. This information is not intended to replace advice given to you by your health care provider. Make sure you discuss any questions you have with your health care provider. Document Released: 09/12/2005 Document Revised: 08/10/2016 Document Reviewed: 08/10/2016 Elsevier Interactive Patient Education  [employer].

## 2018-01-04 DIAGNOSIS — M25511 Pain in right shoulder: Secondary | ICD-10-CM | POA: Diagnosis not present

## 2018-01-04 DIAGNOSIS — M25562 Pain in left knee: Secondary | ICD-10-CM | POA: Diagnosis not present

## 2018-01-04 DIAGNOSIS — R42 Dizziness and giddiness: Secondary | ICD-10-CM | POA: Diagnosis not present

## 2018-01-04 DIAGNOSIS — R030 Elevated blood-pressure reading, without diagnosis of hypertension: Secondary | ICD-10-CM | POA: Diagnosis not present

## 2018-01-04 DIAGNOSIS — F458 Other somatoform disorders: Secondary | ICD-10-CM | POA: Diagnosis not present

## 2018-01-04 DIAGNOSIS — G8918 Other acute postprocedural pain: Secondary | ICD-10-CM | POA: Diagnosis not present

## 2018-01-04 DIAGNOSIS — M25552 Pain in left hip: Secondary | ICD-10-CM | POA: Diagnosis not present

## 2018-01-08 ENCOUNTER — Ambulatory Visit (INDEPENDENT_AMBULATORY_CARE_PROVIDER_SITE_OTHER): Payer: Medicare Other | Admitting: Family Medicine

## 2018-01-08 DIAGNOSIS — Z0130 Encounter for examination of blood pressure without abnormal findings: Secondary | ICD-10-CM

## 2018-01-08 DIAGNOSIS — R030 Elevated blood-pressure reading, without diagnosis of hypertension: Secondary | ICD-10-CM

## 2018-01-08 NOTE — Progress Notes (Signed)
Patient here for nurse visit BP check per order from Dr. Volanda Napoleon, reading manual 158/86, pt automatic wrist reading 151/95 pulse 61..   Patient reports compliance with prescribed BP medications: Yes  Last dose of BP medication:  This morning  Wt Readings from Last 3 Encounters:  01/03/18 215 lb 8 oz (97.8 kg)   Temp Readings from Last 3 Encounters:  01/03/18 98.2 F (36.8 C) (Oral)  12/22/17 98.4 F (36.9 C) (Oral)  12/22/17 97.7 F (36.5 C) (Oral)   BP Readings from Last 3 Encounters:  01/03/18 120/90  12/22/17 (!) 152/104  12/22/17 (!) 176/100    Will forward this note to Dr. Volanda Napoleon for review and recommendation.     NIMMONS, SYLVIA ANN, RN

## 2018-01-16 DIAGNOSIS — R42 Dizziness and giddiness: Secondary | ICD-10-CM | POA: Diagnosis not present

## 2018-01-16 DIAGNOSIS — F458 Other somatoform disorders: Secondary | ICD-10-CM | POA: Diagnosis not present

## 2018-01-16 DIAGNOSIS — M25511 Pain in right shoulder: Secondary | ICD-10-CM | POA: Diagnosis not present

## 2018-01-16 DIAGNOSIS — M25562 Pain in left knee: Secondary | ICD-10-CM | POA: Diagnosis not present

## 2018-01-16 DIAGNOSIS — M25552 Pain in left hip: Secondary | ICD-10-CM | POA: Diagnosis not present

## 2018-01-16 DIAGNOSIS — G8918 Other acute postprocedural pain: Secondary | ICD-10-CM | POA: Diagnosis not present

## 2018-01-16 DIAGNOSIS — R030 Elevated blood-pressure reading, without diagnosis of hypertension: Secondary | ICD-10-CM | POA: Diagnosis not present

## 2018-01-17 ENCOUNTER — Ambulatory Visit (INDEPENDENT_AMBULATORY_CARE_PROVIDER_SITE_OTHER): Payer: Medicare Other | Admitting: Family Medicine

## 2018-01-17 DIAGNOSIS — Z0130 Encounter for examination of blood pressure without abnormal findings: Secondary | ICD-10-CM | POA: Diagnosis not present

## 2018-01-17 DIAGNOSIS — R030 Elevated blood-pressure reading, without diagnosis of hypertension: Secondary | ICD-10-CM

## 2018-01-17 NOTE — Progress Notes (Signed)
Patient here for nurse visit BP check per order from Dr. Volanda Napoleon, reading is 134/91 and pulse 55.Nancy Christensen   Patient reports compliance with prescribed BP medications: yes  Last dose of BP medication: this morning around 7 am  BP Readings from Last 3 Encounters:  01/03/18 120/90  12/22/17 (!) 152/104  12/22/17 (!) 176/100   Pulse Readings from Last 3 Encounters:  01/03/18 (!) 57  12/22/17 (!) 57  12/22/17 62    Per Dr. Volanda Napoleon: schedule nurse visit in 2 days, continue with Losartan 50 mg take 1 every day and bring in blood pressure machine for next visit so we can compare readings.   Patient verbalized understanding of instructions.   NIMMONS, SYLVIA ANN, RN

## 2018-01-19 ENCOUNTER — Ambulatory Visit (INDEPENDENT_AMBULATORY_CARE_PROVIDER_SITE_OTHER): Payer: Medicare Other | Admitting: Family Medicine

## 2018-01-19 VITALS — BP 139/86 | HR 53

## 2018-01-19 DIAGNOSIS — R030 Elevated blood-pressure reading, without diagnosis of hypertension: Secondary | ICD-10-CM | POA: Diagnosis not present

## 2018-01-19 NOTE — Progress Notes (Signed)
Patient here for nurse visit BP check per order from Dr. Volanda Napoleon, reading 139/86 pulse 59,  Wrist cuff reading 139/71 pulse 59.  Patient reports compliance with prescribed BP medications: yes  Last dose of BP medication: took this morning around 7:30 am.  BP Readings from Last 3 Encounters:  01/03/18 120/90  12/22/17 (!) 152/104  12/22/17 (!) 176/100   Pulse Readings from Last 3 Encounters:  01/03/18 (!) 57  12/22/17 (!) 57  12/22/17 62    Per Dr. Volanda Napoleon, continue Losartan 50 mg take 1 every day, monitor blood pressure and schedule 1 month follow up with provider, which we did. Appointment on 02/14/18 Wednesday at 9:00 am and 30 minute visit.    Patient verbalized understanding of instructions.   Wilmont Olund ANN, RN

## 2018-01-23 DIAGNOSIS — M25562 Pain in left knee: Secondary | ICD-10-CM | POA: Diagnosis not present

## 2018-01-23 DIAGNOSIS — M25511 Pain in right shoulder: Secondary | ICD-10-CM | POA: Diagnosis not present

## 2018-01-23 DIAGNOSIS — G8918 Other acute postprocedural pain: Secondary | ICD-10-CM | POA: Diagnosis not present

## 2018-01-23 DIAGNOSIS — R42 Dizziness and giddiness: Secondary | ICD-10-CM | POA: Diagnosis not present

## 2018-01-23 DIAGNOSIS — F458 Other somatoform disorders: Secondary | ICD-10-CM | POA: Diagnosis not present

## 2018-01-23 DIAGNOSIS — R030 Elevated blood-pressure reading, without diagnosis of hypertension: Secondary | ICD-10-CM | POA: Diagnosis not present

## 2018-01-23 DIAGNOSIS — M25552 Pain in left hip: Secondary | ICD-10-CM | POA: Diagnosis not present

## 2018-01-24 LAB — LIPID PANEL
CHOLESTEROL: 263 — AB (ref 0–200)
HDL: 55 (ref 35–70)
LDL CALC: 179
Triglycerides: 143 (ref 40–160)

## 2018-01-25 ENCOUNTER — Other Ambulatory Visit: Payer: Medicare Other

## 2018-01-25 ENCOUNTER — Other Ambulatory Visit (INDEPENDENT_AMBULATORY_CARE_PROVIDER_SITE_OTHER): Payer: Medicare Other

## 2018-01-25 ENCOUNTER — Encounter: Payer: Self-pay | Admitting: Internal Medicine

## 2018-01-25 ENCOUNTER — Ambulatory Visit (INDEPENDENT_AMBULATORY_CARE_PROVIDER_SITE_OTHER): Payer: Medicare Other | Admitting: Internal Medicine

## 2018-01-25 VITALS — BP 124/76 | HR 60 | Ht 63.5 in | Wt 215.0 lb

## 2018-01-25 DIAGNOSIS — R05 Cough: Secondary | ICD-10-CM

## 2018-01-25 DIAGNOSIS — R058 Other specified cough: Secondary | ICD-10-CM

## 2018-01-25 DIAGNOSIS — R918 Other nonspecific abnormal finding of lung field: Secondary | ICD-10-CM | POA: Diagnosis not present

## 2018-01-25 HISTORY — DX: Other specified cough: R05.8

## 2018-01-25 LAB — CBC WITH DIFFERENTIAL/PLATELET
BASOS ABS: 0.1 10*3/uL (ref 0.0–0.1)
Basophils Relative: 1 % (ref 0.0–3.0)
EOS ABS: 0.1 10*3/uL (ref 0.0–0.7)
Eosinophils Relative: 1.8 % (ref 0.0–5.0)
HCT: 38.1 % (ref 36.0–46.0)
HEMOGLOBIN: 13 g/dL (ref 12.0–15.0)
LYMPHS PCT: 34.8 % (ref 12.0–46.0)
Lymphs Abs: 2.6 10*3/uL (ref 0.7–4.0)
MCHC: 34.1 g/dL (ref 30.0–36.0)
MCV: 83.7 fl (ref 78.0–100.0)
MONO ABS: 0.5 10*3/uL (ref 0.1–1.0)
Monocytes Relative: 6.3 % (ref 3.0–12.0)
NEUTROS ABS: 4.2 10*3/uL (ref 1.4–7.7)
Neutrophils Relative %: 56.1 % (ref 43.0–77.0)
PLATELETS: 213 10*3/uL (ref 150.0–400.0)
RBC: 4.55 Mil/uL (ref 3.87–5.11)
RDW: 14.6 % (ref 11.5–15.5)
WBC: 7.5 10*3/uL (ref 4.0–10.5)

## 2018-01-25 LAB — SEDIMENTATION RATE: SED RATE: 35 mm/h — AB (ref 0–30)

## 2018-01-25 LAB — NITRIC OXIDE: Nitric Oxide: 21

## 2018-01-25 MED ORDER — FAMOTIDINE 20 MG PO TABS: ORAL_TABLET | ORAL | 11 refills | Status: DC

## 2018-01-25 MED ORDER — PANTOPRAZOLE SODIUM 40 MG PO TBEC: 40.0000 mg | DELAYED_RELEASE_TABLET | Freq: Every day | ORAL | 2 refills | Status: DC

## 2018-01-25 NOTE — Patient Instructions (Signed)
Pantoprazole (protonix) 40 mg   Take  30-60 min before first meal of the day and Pepcid (famotidine)  20 mg one @  bedtime until return to office - this is the best way to tell whether stomach acid is contributing to your problem.     GERD (REFLUX)  is an extremely common cause of respiratory symptoms just like yours , many times with no obvious heartburn at all.    It can be treated with medication, but also with lifestyle changes including elevation of the head of your bed (ideally with 6 inch  bed blocks),  Smoking cessation, avoidance of late meals, excessive alcohol, and avoid fatty foods, chocolate, peppermint, colas, red wine, and acidic juices such as orange juice.  NO MINT OR MENTHOL PRODUCTS SO NO COUGH DROPS   USE SUGARLESS CANDY INSTEAD (Jolley ranchers or Stover's or Life Savers) or even ice chips will also do - the key is to swallow to prevent all throat clearing. NO OIL BASED VITAMINS - use powdered substitutes.     Please remember to go to the lab department downstairs in the basement  for your tests - we will call you with the results when they are available.      Please schedule a follow up office visit in 4 weeks, sooner if needed  with all medications /inhalers/ solutions in hand so we can verify exactly what you are taking. This includes all medications from all doctors and over the counters

## 2018-01-25 NOTE — Progress Notes (Signed)
Subjective:     Patient ID: Nancy Christensen, female   DOB: 1951-03-06,    MRN: [id number]  HPI  36 yobf never smoker with pattern of "bronchitis" while living in [place] that impoved off ACEi in 212/2015 with w/u reportedly neg CT chest and retired to [employer] in Nov 2018 and started there in  Dec 2018 with intermittent  chest discomfort L upper with hbp > neg cards eval but incidental SPN's > pulmonary eval around Jan 2019 > rec reheck in 6 months and so referred to pulmonary clinic 01/25/2018 by Dr   Volanda Napoleon     01/25/2018 1st [facility] office visit/ Kimble Delaurentis   Chief Complaint  Patient presents with  . Pulmonary Consult    Referred by Dr. Volanda Napoleon for eval of pulmonary nodules. Pt c/o cough "for years". She went to ED while in [place] in Jan 2018 with left chest discomfort and CT Chest was done. She states she feels like she needs to cough up sputum, but it gets caught in her throat. Cough is esp worse at night when she lies down.   onset of cough  Was  1980  while living in  [place]  esp p stirring around assoc with pnds eval by allergy > neg  Worse with voice use/ perfumes/  Neg foods/ bending urge to clear the throat during the day and immediately on supine at hs but then settles down and sleeps ok  Cp's = paresthesia like x 5 sec random from 0 - 10 x daily s pattern never noct  And are milder now than when prompted original eval "like water running down the front of my chest"    No obvious patterns in day to day or daytime variability or assoc excess/ purulent sputum or mucus plugs or hemoptysis  or chest tightness, subjective wheeze or overt sinus or hb symptoms. No unusual exposure hx or h/o childhood pna/ asthma or knowledge of premature birth.  Sleeping  Ok   without nocturnal  or early am exacerbation  of respiratory  c/o's or need for noct saba. Also denies any obvious fluctuation of symptoms with weather or environmental changes or other aggravating or alleviating factors  except as outlined above   Current Allergies, Complete Past Medical History, Past Surgical History, Family History, and Social History were reviewed in [employer] record.  ROS  The following are not active complaints unless bolded Hoarseness, sore throat, dysphagia, dental problems, itching, sneezing,  nasal congestion or discharge of excess mucus or purulent secretions, ear ache,   fever, chills, sweats, unintended wt loss or wt gain, classically pleuritic or exertional cp,  orthopnea pnd or arm/hand swelling  or leg swelling, presyncope, palpitations, abdominal pain, anorexia, nausea, vomiting, diarrhea  or change in bowel habits or change in bladder habits, change in stools or change in urine, dysuria, hematuria,  rash, arthralgias, visual complaints, headache, numbness, weakness or ataxia or problems with walking or coordination,  change in mood or  memory.        Current Meds  Medication Sig  . losartan (COZAAR) 50 MG tablet Take 1 tablet (50 mg total) by mouth daily.  . meclizine (ANTIVERT) 25 MG tablet Take 1 tablet (25 mg total) by mouth 3 (three) times daily as needed for dizziness.  . ondansetron (ZOFRAN ODT) 4 MG disintegrating tablet Take 1 tablet (4 mg total) by mouth every 8 (eight) hours as needed.        Review of Systems  Objective:   Physical Exam  Amb bf nad   Wt Readings from Last 3 Encounters:  01/25/18 215 lb (97.5 kg)  01/03/18 215 lb 8 oz (97.8 kg)     Vital signs reviewed - Note on arrival 02 sats  97% on RA   HEENT: nl dentition, turbinates bilaterally, and oropharynx. Nl external ear canals without cough reflex   NECK :  without JVD/Nodes/TM/ nl carotid upstrokes bilaterally   LUNGS: no acc muscle use,  Nl contour chest which is clear to A and P bilaterally without cough on insp or exp maneuvers   CV:  RRR  no s3 or murmur or increase in P2, and no edema   ABD:  soft and nontender with nl inspiratory excursion in the  supine position. No bruits or organomegaly appreciated, bowel sounds nl  MS:  Nl gait/ ext warm without deformities, calf tenderness, cyanosis or clubbing No obvious joint restrictions   SKIN: warm and dry without lesions    NEURO:  alert, approp, nl sensorium with  no motor or cerebellar deficits apparent.         I personally reviewed images and agree with radiology impression as follows:   Chest CT  09/24/17 MPN's  Largest 4 x 6 mm R laterally         Lab Results  Component Value Date   ESRSEDRATE 35 (H) 01/25/2018     Labs ordered 01/25/2018  Allergy profile      Assessment:

## 2018-01-25 NOTE — Assessment & Plan Note (Addendum)
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   These are the same pts (now being labeled as having "irritable larynx syndrome" by some cough centers) who not infrequently have a history of having failed to tolerate ace inhibitors,  dry powder inhalers or biphosphonates or report having atypical/extraesophageal reflux symptoms that don't respond to standard doses of PPI  and are easily confused as having aecopd or asthma flares by even experienced allergists/ pulmonologists (myself included).   Of the three most common causes of  Sub-acute / recurrent or chronic cough, only one (GERD)  can actually contribute to/ trigger  the other two (asthma and post nasal drip syndrome)  and perpetuate the cylce of cough.  While not intuitively obvious, many patients with chronic low grade reflux do not cough until there is a primary insult that disturbs the protective epithelial barrier and exposes sensitive nerve endings.   This is typically viral but can due to PNDS and  either may apply here.    The point is that once this occurs, it is difficult to eliminate the cycle  using anything but a maximally effective acid suppression regimen at least in the short run, accompanied by an appropriate diet to address non acid GERD  And then regroup and consider trial of gabapentin if allergy w/u is neg and pt does not respond to 1st gen H1 blockers per guidelines    First step though  is gerd rx and return with all meds in hand using a trust but verify approach to confirm accurate Medication  Reconciliation The principal here is that until we are certain that the  patients are doing what we've asked, it makes no sense to ask them to do more.

## 2018-01-26 ENCOUNTER — Encounter: Payer: Self-pay | Admitting: Internal Medicine

## 2018-01-26 DIAGNOSIS — R918 Other nonspecific abnormal finding of lung field: Secondary | ICD-10-CM | POA: Insufficient documentation

## 2018-01-26 HISTORY — DX: Other nonspecific abnormal finding of lung field: R91.8

## 2018-01-26 LAB — RESPIRATORY ALLERGY PROFILE REGION II ~~LOC~~
Allergen, A. alternata, m6: <0.1 kU/L
Allergen, Cedar tree, t12: <0.1 kU/L
Allergen, Cottonwood, t14: <0.1 kU/L
Allergen, Mouse Urine Protein, e78: <0.1 kU/L
Allergen, Mulberry, t76: <0.1 kU/L
Allergen, Oak,t7: <0.1 kU/L
Aspergillus fumigatus, m3: <0.1 kU/L
Bermuda Grass: <0.1 kU/L
CLASS: 0
CLASS: 0
CLASS: 0
CLASS: 0
CLASS: 0
CLASS: 0
CLASS: 0
CLASS: 0
CLASS: 0
CLASS: 0
CLASS: 0
Cat Dander: <0.1 kU/L
Class: 0
Class: 0
Class: 0
Class: 0
Class: 0
Class: 0
Class: 0
Class: 0
Class: 0
Class: 0
Class: 0
Class: 0
Class: 0
Cockroach: <0.1 kU/L
IGE (IMMUNOGLOBULIN E), SERUM: 17 kU/L (ref <=114)
Rough Pigweed  IgE: <0.1 kU/L
Timothy Grass: <0.1 kU/L

## 2018-01-26 LAB — INTERPRETATION:

## 2018-01-26 NOTE — Assessment & Plan Note (Addendum)
Passive smoke exp/ remote  Chest CT  09/24/17 MPN's  Largest 4 x 6 mm R laterally  > rec f/u 09/14/18 (reminder file)    As pt reports neg CT 2015 (not avail)  - ESR 01/25/2018 = 35     Unless we can access the prior disc and prove the nodules were there 4 years ago, f/u approp in 1 year as she is relatively low risk but the largest is up to 6 mm where   the cutoff for no f/u needed is actually < 6 mm   CT results reviewed with pt >>> Too small for PET or bx, not suspicious enough for excisional bx > really only option for now is follow the Fleischner society guidelines as rec by radiology.   Discussed in detail all the  indications, usual  risks and alternatives  relative to the benefits with patient who agrees to proceed with conservative f/u as outlined     Total time devoted to counseling  > 50 % of initial 60 min office visit:  review case with pt/ discussion of options/alternatives/ personally creating written customized instructions  in presence of pt  then going over those specific  Instructions directly with the pt including how to use all of the meds but in particular covering each new medication in detail and the difference between the maintenance= "automatic" meds and the prns using an action plan format for the latter (If this problem/symptom => do that organization reading Left to right).  Please see AVS from this visit for a full list of these instructions which I personally wrote for this pt and  are unique to this visit.

## 2018-01-29 NOTE — Progress Notes (Signed)
Spoke with pt and notified of results per Dr. Wert. Pt verbalized understanding and denied any questions. 

## 2018-02-08 DIAGNOSIS — M25511 Pain in right shoulder: Secondary | ICD-10-CM | POA: Diagnosis not present

## 2018-02-08 DIAGNOSIS — F458 Other somatoform disorders: Secondary | ICD-10-CM | POA: Diagnosis not present

## 2018-02-08 DIAGNOSIS — M25552 Pain in left hip: Secondary | ICD-10-CM | POA: Diagnosis not present

## 2018-02-08 DIAGNOSIS — R42 Dizziness and giddiness: Secondary | ICD-10-CM | POA: Diagnosis not present

## 2018-02-08 DIAGNOSIS — R030 Elevated blood-pressure reading, without diagnosis of hypertension: Secondary | ICD-10-CM | POA: Diagnosis not present

## 2018-02-08 DIAGNOSIS — G8918 Other acute postprocedural pain: Secondary | ICD-10-CM | POA: Diagnosis not present

## 2018-02-08 DIAGNOSIS — M25562 Pain in left knee: Secondary | ICD-10-CM | POA: Diagnosis not present

## 2018-02-11 ENCOUNTER — Inpatient Hospital Stay (HOSPITAL_COMMUNITY)
Admission: EM | Admit: 2018-02-11 | Discharge: 2018-02-13 | DRG: 419 | Disposition: A | Discharge status: Home / Self Care | Payer: Medicare Other | Attending: Surgery | Admitting: Surgery

## 2018-02-11 ENCOUNTER — Encounter (HOSPITAL_COMMUNITY): Payer: Self-pay | Admitting: Emergency Medicine

## 2018-02-11 ENCOUNTER — Emergency Department (HOSPITAL_COMMUNITY): Payer: Medicare Other

## 2018-02-11 ENCOUNTER — Other Ambulatory Visit: Payer: Self-pay

## 2018-02-11 DIAGNOSIS — K8010 Calculus of gallbladder with chronic cholecystitis without obstruction: Secondary | ICD-10-CM

## 2018-02-11 DIAGNOSIS — I10 Essential (primary) hypertension: Secondary | ICD-10-CM | POA: Diagnosis not present

## 2018-02-11 DIAGNOSIS — Z79899 Other long term (current) drug therapy: Secondary | ICD-10-CM | POA: Diagnosis not present

## 2018-02-11 DIAGNOSIS — R1011 Right upper quadrant pain: Secondary | ICD-10-CM | POA: Diagnosis not present

## 2018-02-11 DIAGNOSIS — R918 Other nonspecific abnormal finding of lung field: Secondary | ICD-10-CM | POA: Diagnosis not present

## 2018-02-11 DIAGNOSIS — E785 Hyperlipidemia, unspecified: Secondary | ICD-10-CM | POA: Diagnosis present

## 2018-02-11 DIAGNOSIS — K819 Cholecystitis, unspecified: Secondary | ICD-10-CM | POA: Diagnosis not present

## 2018-02-11 DIAGNOSIS — K8000 Calculus of gallbladder with acute cholecystitis without obstruction: Principal | ICD-10-CM | POA: Diagnosis present

## 2018-02-11 DIAGNOSIS — K8020 Calculus of gallbladder without cholecystitis without obstruction: Secondary | ICD-10-CM | POA: Diagnosis not present

## 2018-02-11 HISTORY — DX: Calculus of gallbladder with chronic cholecystitis without obstruction: K80.10

## 2018-02-11 LAB — CBC WITH DIFFERENTIAL/PLATELET
BASOS ABS: 0 10*3/uL (ref 0.0–0.1)
BASOS PCT: 0 %
Eosinophils Absolute: 0.1 10*3/uL (ref 0.0–0.7)
Eosinophils Relative: 1 %
HEMATOCRIT: 39.9 % (ref 36.0–46.0)
HEMOGLOBIN: 13.4 g/dL (ref 12.0–15.0)
Lymphocytes Relative: 26 %
Lymphs Abs: 2.6 10*3/uL (ref 0.7–4.0)
MCH: 28.6 pg (ref 26.0–34.0)
MCHC: 33.6 g/dL (ref 30.0–36.0)
MCV: 85.1 fL (ref 78.0–100.0)
MONOS PCT: 5 %
Monocytes Absolute: 0.5 10*3/uL (ref 0.1–1.0)
NEUTROS ABS: 6.8 10*3/uL (ref 1.7–7.7)
NEUTROS PCT: 68 %
Platelets: 215 10*3/uL (ref 150–400)
RBC: 4.69 MIL/uL (ref 3.87–5.11)
RDW: 13.7 % (ref 11.5–15.5)
WBC: 10 10*3/uL (ref 4.0–10.5)

## 2018-02-11 LAB — HEPATIC FUNCTION PANEL
ALT: 20 U/L (ref 14–54)
AST: 23 U/L (ref 15–41)
Albumin: 4.6 g/dL (ref 3.5–5.0)
Alkaline Phosphatase: 79 U/L (ref 38–126)
Bilirubin, Direct: 0.1 mg/dL (ref 0.1–0.5)
Indirect Bilirubin: 0.2 mg/dL — ABNORMAL LOW (ref 0.3–0.9)
Total Bilirubin: 0.3 mg/dL (ref 0.3–1.2)
Total Protein: 8.5 g/dL — ABNORMAL HIGH (ref 6.5–8.1)

## 2018-02-11 LAB — BASIC METABOLIC PANEL
ANION GAP: 12 (ref 5–15)
BUN: 16 mg/dL (ref 6–20)
CHLORIDE: 100 mmol/L — AB (ref 101–111)
CO2: 26 mmol/L (ref 22–32)
Calcium: 9.6 mg/dL (ref 8.9–10.3)
Creatinine, Ser: 1.18 mg/dL — ABNORMAL HIGH (ref 0.44–1.00)
GFR calc non Af Amer: 47 mL/min — ABNORMAL LOW (ref >60)
GFR, EST AFRICAN AMERICAN: 54 mL/min — AB (ref >60)
Glucose, Bld: 146 mg/dL — ABNORMAL HIGH (ref 65–99)
Potassium: 3.5 mmol/L (ref 3.5–5.1)
Sodium: 138 mmol/L (ref 135–145)

## 2018-02-11 LAB — LIPASE, BLOOD: Lipase: 50 U/L (ref 11–51)

## 2018-02-11 LAB — SURGICAL PCR SCREEN
MRSA, PCR: NEGATIVE
STAPHYLOCOCCUS AUREUS: NEGATIVE

## 2018-02-11 MED ORDER — ONDANSETRON 4 MG PO TBDP
4.0000 mg | ORAL_TABLET | Freq: Four times a day (QID) | ORAL | Status: DC | PRN
Start: 2018-02-11 — End: 2018-02-13

## 2018-02-11 MED ORDER — ACETAMINOPHEN 650 MG RE SUPP: 650.0000 mg | Freq: Four times a day (QID) | RECTAL | Status: DC | PRN

## 2018-02-11 MED ORDER — SODIUM CHLORIDE 0.9 % IV SOLN
2.0000 g | INTRAVENOUS | Status: DC
  Administered 2018-02-11: 2 g via INTRAVENOUS
  Filled 2018-02-11: qty 20
  Filled 2018-02-11: qty 2

## 2018-02-11 MED ORDER — ENOXAPARIN SODIUM 40 MG/0.4ML ~~LOC~~ SOLN: 40.0000 mg | SUBCUTANEOUS | Status: DC

## 2018-02-11 MED ORDER — ONDANSETRON HCL 4 MG/2ML IJ SOLN
4.0000 mg | Freq: Once | INTRAMUSCULAR | Status: AC
  Administered 2018-02-11: 4 mg via INTRAVENOUS
  Filled 2018-02-11: qty 2

## 2018-02-11 MED ORDER — LOSARTAN POTASSIUM 50 MG PO TABS
50.0000 mg | ORAL_TABLET | Freq: Every day | ORAL | Status: DC
  Administered 2018-02-11 – 2018-02-13 (×3): 50 mg via ORAL
  Filled 2018-02-11 (×3): qty 1

## 2018-02-11 MED ORDER — ACETAMINOPHEN 325 MG PO TABS
650.0000 mg | ORAL_TABLET | Freq: Four times a day (QID) | ORAL | Status: DC | PRN
  Administered 2018-02-12: 650 mg via ORAL
  Filled 2018-02-11: qty 2

## 2018-02-11 MED ORDER — FENTANYL CITRATE (PF) 100 MCG/2ML IJ SOLN
100.0000 ug | Freq: Once | INTRAMUSCULAR | Status: AC
  Administered 2018-02-11: 100 ug via INTRAVENOUS
  Filled 2018-02-11: qty 2

## 2018-02-11 MED ORDER — FENTANYL CITRATE (PF) 100 MCG/2ML IJ SOLN
50.0000 ug | Freq: Once | INTRAMUSCULAR | Status: AC
  Administered 2018-02-11: 50 ug via INTRAVENOUS
  Filled 2018-02-11: qty 2

## 2018-02-11 MED ORDER — HYDROMORPHONE HCL 1 MG/ML IJ SOLN
1.0000 mg | Freq: Once | INTRAMUSCULAR | Status: AC
  Administered 2018-02-11: 1 mg via INTRAVENOUS
  Filled 2018-02-11: qty 1

## 2018-02-11 MED ORDER — OXYCODONE HCL 5 MG PO TABS
5.0000 mg | ORAL_TABLET | ORAL | Status: DC | PRN
  Administered 2018-02-13: 5 mg via ORAL
  Filled 2018-02-11: qty 1

## 2018-02-11 MED ORDER — SODIUM CHLORIDE 0.9 % IV BOLUS
1000.0000 mL | Freq: Once | INTRAVENOUS | Status: AC
  Administered 2018-02-11: 1000 mL via INTRAVENOUS

## 2018-02-11 MED ORDER — FENTANYL CITRATE (PF) 100 MCG/2ML IJ SOLN
50.0000 ug | Freq: Once | INTRAMUSCULAR | Status: AC
Start: 2018-02-11 — End: 2018-02-11
  Administered 2018-02-11: 50 ug via INTRAVENOUS
  Filled 2018-02-11: qty 2

## 2018-02-11 MED ORDER — MORPHINE SULFATE (PF) 2 MG/ML IV SOLN
1.0000 mg | INTRAVENOUS | Status: DC | PRN
  Administered 2018-02-11 – 2018-02-12 (×5): 2 mg via INTRAVENOUS
  Filled 2018-02-11 (×5): qty 1

## 2018-02-11 MED ORDER — POTASSIUM CHLORIDE IN NACL 20-0.9 MEQ/L-% IV SOLN
INTRAVENOUS | Status: DC
  Administered 2018-02-11 – 2018-02-12 (×3): via INTRAVENOUS
  Filled 2018-02-11 (×4): qty 1000

## 2018-02-11 MED ORDER — DIPHENHYDRAMINE HCL 12.5 MG/5ML PO ELIX: 12.5000 mg | ORAL_SOLUTION | Freq: Four times a day (QID) | ORAL | Status: DC | PRN

## 2018-02-11 MED ORDER — DIPHENHYDRAMINE HCL 50 MG/ML IJ SOLN: 12.5000 mg | Freq: Four times a day (QID) | INTRAMUSCULAR | Status: DC | PRN

## 2018-02-11 MED ORDER — ONDANSETRON HCL 4 MG/2ML IJ SOLN
4.0000 mg | Freq: Four times a day (QID) | INTRAMUSCULAR | Status: DC | PRN
  Administered 2018-02-11: 4 mg via INTRAVENOUS

## 2018-02-11 NOTE — ED Notes (Signed)
Provider made aware of pt elevated BP.

## 2018-02-11 NOTE — ED Triage Notes (Signed)
Pt arriving with abdominal and back pain. Pt reports multiple episodes of emesis beginning earlier this morning. Pt reports pain in abdomen radiates to back. Pt denies taking any medications for symptoms prior to arrival.

## 2018-02-11 NOTE — ED Notes (Signed)
ED TO INPATIENT HANDOFF REPORT  Name/Age/Gender Nancy Christensen 67 y.o. female  Code Status Advance Directive Documentation     Most Recent Value  Type of Advance Directive  Healthcare Power of [place]  Pre-existing out of facility DNR order (yellow form or pink MOST form)  -  "MOST" Form in Place?  -      Home/SNF/Other Home  Chief Complaint abd pain / emesis   Level of Care/Admitting Diagnosis ED Disposition    ED Disposition Condition [place]: [facility] [[id number]]  Level of Care: Med-Surg [16]  Diagnosis: Cholecystitis with cholelithiasis [544920]  Admitting Physician: [place], [place]  Attending Physician: CCS, MD [[id number]]  Estimated length of stay: past midnight tomorrow  Certification:: I certify this patient will need inpatient services for at least 2 midnights  PT Class (Do Not Modify): Inpatient [101]  PT Acc Code (Do Not Modify): Private [1]       Medical History Past Medical History:  Diagnosis Date  . Hyperlipidemia   . Hypertension     Allergies No Known Allergies  IV Location/Drains/Wounds Patient Lines/Drains/Airways Status   Active Line/Drains/Airways    Name:   Placement date:   Placement time:   Site:   Days:   Peripheral IV 02/11/18 Right Antecubital   02/11/18    0755    Antecubital   less than 1          Labs/Imaging Results for orders placed or performed during the hospital encounter of 02/11/18 (from the past 48 hour(s))  CBC with Differential     Status: None   Collection Time: 02/11/18  6:07 AM  Result Value Ref Range   WBC 10.0 4.0 - 10.5 K/uL   RBC 4.69 3.87 - 5.11 MIL/uL   Hemoglobin 13.4 12.0 - 15.0 g/dL   HCT 39.9 36.0 - 46.0 %   MCV 85.1 78.0 - 100.0 fL   MCH 28.6 26.0 - 34.0 pg   MCHC 33.6 30.0 - 36.0 g/dL   RDW 13.7 11.5 - 15.5 %   Platelets 215 150 - 400 K/uL   Neutrophils Relative % 68 %   Neutro Abs 6.8 1.7 - 7.7 K/uL   Lymphocytes Relative 26 %   Lymphs  Abs 2.6 0.7 - 4.0 K/uL   Monocytes Relative 5 %   Monocytes Absolute 0.5 0.1 - 1.0 K/uL   Eosinophils Relative 1 %   Eosinophils Absolute 0.1 0.0 - 0.7 K/uL   Basophils Relative 0 %   Basophils Absolute 0.0 0.0 - 0.1 K/uL    Comment: Performed at [facility], [place] [address], [place], [place] [address]  Basic metabolic panel     Status: Abnormal   Collection Time: 02/11/18  6:07 AM  Result Value Ref Range   Sodium 138 135 - 145 mmol/L   Potassium 3.5 3.5 - 5.1 mmol/L   Chloride 100 (L) 101 - 111 mmol/L   CO2 26 22 - 32 mmol/L   Glucose, Bld 146 (H) 65 - 99 mg/dL   BUN 16 6 - 20 mg/dL   Creatinine, Ser 1.18 (H) 0.44 - 1.00 mg/dL   Calcium 9.6 8.9 - 10.3 mg/dL   GFR calc non Af Amer 47 (L) >60 mL/min   GFR calc Af Amer 54 (L) >60 mL/min    Comment: (NOTE) The eGFR has been calculated using the CKD EPI equation. This calculation has not been validated in all clinical situations. eGFR's persistently <60 mL/min  signify possible Chronic Kidney Disease.    Anion gap 12 5 - 15    Comment: Performed at [facility], [place] [address], [place], [place] [address]  Lipase, blood     Status: None   Collection Time: 02/11/18  6:07 AM  Result Value Ref Range   Lipase 50 11 - 51 U/L    Comment: Performed at [facility], [place] [address], [place], [place] [address]  Hepatic function panel     Status: Abnormal   Collection Time: 02/11/18  7:59 AM  Result Value Ref Range   Total Protein 8.5 (H) 6.5 - 8.1 g/dL   Albumin 4.6 3.5 - 5.0 g/dL   AST 23 15 - 41 U/L   ALT 20 14 - 54 U/L   Alkaline Phosphatase 79 38 - 126 U/L   Total Bilirubin 0.3 0.3 - 1.2 mg/dL   Bilirubin, Direct 0.1 0.1 - 0.5 mg/dL   Indirect Bilirubin 0.2 (L) 0.3 - 0.9 mg/dL    Comment: Performed at [facility], [place] [address] [place], [place] [address]   US Abdomen Limited  Result Date: 02/11/2018 CLINICAL DATA:  RIGHT upper quadrant pain. EXAM:  ULTRASOUND ABDOMEN LIMITED RIGHT UPPER QUADRANT COMPARISON:  None. FINDINGS: Gallbladder: Small pericholecystic fluid. Mild gallbladder wall thickening. Multiple echogenic calculi. Sludge intermixed with a calculi positive sonographic Murphy's sign Common bile duct: Diameter: Normal at 4 mm Liver: No focal lesion identified. Within normal limits in parenchymal echogenicity. Portal vein is patent on color Doppler imaging with normal direction of blood flow towards the liver. IMPRESSION: 1. Multiple gallstones, wall thickening and positive sonographic Murphy's sign are findings which suggest acute cholecystitis. Recommend clinical correlation. 2. No biliary duct dilatation. Electronically Signed   By: Suzy Bouchard M.D.   On: 02/11/2018 13:59    Pending Labs [employer] (From admission, onward)   Start     Ordered   Signed and Held  Comprehensive metabolic panel  Tomorrow morning,   R     Signed and Held   Signed and Held  CBC  Tomorrow morning,   R     Signed and Held      Vitals/Pain Today's Vitals   02/11/18 1054 02/11/18 1055 02/11/18 1311 02/11/18 1508  BP:  (!) 180/99  (!) 184/104  Pulse:  (!) 55  62  Resp:  18  18  Temp:      TempSrc:      SpO2:  99%  99%  Weight:      Height:      PainSc: 8   8      Isolation Precautions No active isolations  Medications Medications  HYDROmorphone (DILAUDID) injection 1 mg (has no administration in time range)  ondansetron (ZOFRAN) injection 4 mg (has no administration in time range)  sodium chloride 0.9 % bolus 1,000 mL (0 mLs Intravenous Stopped 02/11/18 1035)  ondansetron (ZOFRAN) injection 4 mg (4 mg Intravenous Given 02/11/18 0757)  fentaNYL (SUBLIMAZE) injection 50 mcg (50 mcg Intravenous Given 02/11/18 0849)  fentaNYL (SUBLIMAZE) injection 50 mcg (50 mcg Intravenous Given 02/11/18 1055)  fentaNYL (SUBLIMAZE) injection 100 mcg (100 mcg Intravenous Given 02/11/18 1311)    Mobility walks

## 2018-02-11 NOTE — H&P (Signed)
Nancy Christensen is an 67 y.o. female.   Chief Complaint: RUQ abdominal pain HPI: This is a 67 year old female who presented this morning with sharp epigastric abdominal pain, nausea, and vomiting.  This is her third attack this week.  She has had no previous similar symptoms.  She describes moderate to severe sharp pain in the epigastrium moving to the right upper quadrant and through to the back.  Bowel movements have been normal.  She denies fever.  She is otherwise without complaints.    Past Medical History:  Diagnosis Date  . Hyperlipidemia   . Hypertension     History reviewed. No pertinent surgical history.  No family history on file. Social History:  reports that she has never smoked. She has never used smokeless tobacco. Her alcohol and drug histories are not on file.  Allergies: No Known Allergies  Medications Prior to Admission  Medication Sig Dispense Refill  . hydroxypropyl methylcellulose / hypromellose (ISOPTO TEARS / GONIOVISC) 2.5 % ophthalmic solution Place 1 drop into both eyes 3 (three) times daily as needed for dry eyes.    Marland Kitchen losartan (COZAAR) 50 MG tablet Take 1 tablet (50 mg total) by mouth daily. 30 tablet 0  . famotidine (PEPCID) 20 MG tablet One at bedtime (Patient not taking: Reported on 02/11/2018) 30 tablet 11  . meclizine (ANTIVERT) 25 MG tablet Take 1 tablet (25 mg total) by mouth 3 (three) times daily as needed for dizziness. (Patient not taking: Reported on 02/11/2018) 30 tablet 0  . ondansetron (ZOFRAN ODT) 4 MG disintegrating tablet Take 1 tablet (4 mg total) by mouth every 8 (eight) hours as needed. (Patient not taking: Reported on 02/11/2018) 10 tablet 0  . pantoprazole (PROTONIX) 40 MG tablet Take 1 tablet (40 mg total) by mouth daily. Take 30-60 min before first meal of the day (Patient not taking: Reported on 02/11/2018) 30 tablet 2    Results for orders placed or performed during the hospital encounter of 02/11/18 (from the past 48 hour(s))  CBC  with Differential     Status: None   Collection Time: 02/11/18  6:07 AM  Result Value Ref Range   WBC 10.0 4.0 - 10.5 K/uL   RBC 4.69 3.87 - 5.11 MIL/uL   Hemoglobin 13.4 12.0 - 15.0 g/dL   HCT 39.9 36.0 - 46.0 %   MCV 85.1 78.0 - 100.0 fL   MCH 28.6 26.0 - 34.0 pg   MCHC 33.6 30.0 - 36.0 g/dL   RDW 13.7 11.5 - 15.5 %   Platelets 215 150 - 400 K/uL   Neutrophils Relative % 68 %   Neutro Abs 6.8 1.7 - 7.7 K/uL   Lymphocytes Relative 26 %   Lymphs Abs 2.6 0.7 - 4.0 K/uL   Monocytes Relative 5 %   Monocytes Absolute 0.5 0.1 - 1.0 K/uL   Eosinophils Relative 1 %   Eosinophils Absolute 0.1 0.0 - 0.7 K/uL   Basophils Relative 0 %   Basophils Absolute 0.0 0.0 - 0.1 K/uL    Comment: Performed at [facility], [place] [address], [place], [place] [address]  Basic metabolic panel     Status: Abnormal   Collection Time: 02/11/18  6:07 AM  Result Value Ref Range   Sodium 138 135 - 145 mmol/L   Potassium 3.5 3.5 - 5.1 mmol/L   Chloride 100 (L) 101 - 111 mmol/L   CO2 26 22 - 32 mmol/L   Glucose, Bld 146 (H) 65 - 99 mg/dL  BUN 16 6 - 20 mg/dL   Creatinine, Ser 1.18 (H) 0.44 - 1.00 mg/dL   Calcium 9.6 8.9 - 10.3 mg/dL   GFR calc non Af Amer 47 (L) >60 mL/min   GFR calc Af Amer 54 (L) >60 mL/min    Comment: (NOTE) The eGFR has been calculated using the CKD EPI equation. This calculation has not been validated in all clinical situations. eGFR's persistently <60 mL/min signify possible Chronic Kidney Disease.    Anion gap 12 5 - 15    Comment: Performed at [facility], [place] [address] [place], [place] [address]  Lipase, blood     Status: None   Collection Time: 02/11/18  6:07 AM  Result Value Ref Range   Lipase 50 11 - 51 U/L    Comment: Performed at [facility], [place] [address] [place], [place] [address]  Hepatic function panel     Status: Abnormal   Collection Time: 02/11/18  7:59 AM  Result Value Ref Range   Total Protein 8.5  (H) 6.5 - 8.1 g/dL   Albumin 4.6 3.5 - 5.0 g/dL   AST 23 15 - 41 U/L   ALT 20 14 - 54 U/L   Alkaline Phosphatase 79 38 - 126 U/L   Total Bilirubin 0.3 0.3 - 1.2 mg/dL   Bilirubin, Direct 0.1 0.1 - 0.5 mg/dL   Indirect Bilirubin 0.2 (L) 0.3 - 0.9 mg/dL    Comment: Performed at [facility], [place] [address] [place], [place] [address]   US Abdomen Limited  Result Date: 02/11/2018 CLINICAL DATA:  RIGHT upper quadrant pain. EXAM: ULTRASOUND ABDOMEN LIMITED RIGHT UPPER QUADRANT COMPARISON:  None. FINDINGS: Gallbladder: Small pericholecystic fluid. Mild gallbladder wall thickening. Multiple echogenic calculi. Sludge intermixed with a calculi positive sonographic Murphy's sign Common bile duct: Diameter: Normal at 4 mm Liver: No focal lesion identified. Within normal limits in parenchymal echogenicity. Portal vein is patent on color Doppler imaging with normal direction of blood flow towards the liver. IMPRESSION: 1. Multiple gallstones, wall thickening and positive sonographic Murphy's sign are findings which suggest acute cholecystitis. Recommend clinical correlation. 2. No biliary duct dilatation. Electronically Signed   By: Suzy Bouchard M.D.   On: 02/11/2018 13:59    Review of Systems  Constitutional: Negative for chills and fever.  Respiratory: Negative for cough and shortness of breath.   Cardiovascular: Negative for chest pain.  Gastrointestinal: Positive for abdominal pain, nausea and vomiting.  Genitourinary: Negative for dysuria.  All other systems reviewed and are negative.   Blood pressure (!) 149/95, pulse (!) 59, temperature 98.2 F (36.8 C), temperature source Axillary, resp. rate 20, height 5' 4"  (1.626 m), weight 97.5 kg (215 lb), SpO2 96 %. Physical Exam  Constitutional: She is oriented to person, place, and time. She appears well-developed and well-nourished. No distress.  HENT:  Head: Normocephalic and atraumatic.  Right Ear: External ear normal.   Left Ear: External ear normal.  Nose: Nose normal.  Mouth/Throat: Oropharynx is clear and moist. No oropharyngeal exudate.  Eyes: Pupils are equal, round, and reactive to light. Right eye exhibits no discharge. Left eye exhibits no discharge. No scleral icterus.  Neck: Normal range of motion. No tracheal deviation present.  Cardiovascular: Normal rate, regular rhythm, normal heart sounds and intact distal pulses.  No murmur heard. Respiratory: Effort normal and breath sounds normal. No respiratory distress. She has no wheezes.  GI: Soft. There is tenderness. There is guarding.  There is tenderness  with guarding in the right upper quadrant  Musculoskeletal: Normal range of motion. She exhibits no edema, tenderness or deformity.  Lymphadenopathy:    She has no cervical adenopathy.  Neurological: She is alert and oriented to person, place, and time.  Skin: Skin is warm. No rash noted. She is not diaphoretic. No erythema.  Psychiatric: Her behavior is normal. Judgment normal.     Assessment/Plan Acute cholecystitis with cholelithiasis  I have reviewed her laboratory data and ultrasound.  I have discussed the diagnosis with the patient and her brother.  Admission to the hospital for IV antibiotics and laparoscopic cholecystectomy is recommended.  We discussed the reasons for this.  Currently, her liver function tests are normal.  We will repeat her labs in the morning.  She will be n.p.o. after midnight.  She is in agreement with the plans  [facility] A, MD 02/11/2018, 6:37 PM

## 2018-02-11 NOTE — ED Provider Notes (Signed)
[facility] DEPT Provider Note   CSN: [id number] Arrival date & time: 02/11/18  0536     History   Chief Complaint Chief Complaint  Patient presents with  . Abdominal Pain  . Emesis    HPI Nancy Christensen is a 67 y.o. female.  HPI Patient presents to the emergency department with right upper quadrant abdominal pain that started last night.  The patient states that she started having significant upper abdominal pain that got worse throughout the night.  The patient states that she did not take any medications prior to arrival.  She states nothing seems to make her condition better or worse.  Patient states that she had several episodes over the last week of similar pain but they resolve fairly quickly.  The patient denies chest pain, shortness of breath, headache,blurred vision, neck pain, fever, cough, weakness, numbness, dizziness, anorexia, edema,  vomiting, diarrhea, rash, back pain, dysuria, hematemesis, bloody stool, near syncope, or syncope. Past Medical History:  Diagnosis Date  . Hyperlipidemia   . Hypertension     Patient Active Problem List   Diagnosis Date Noted  . Cholecystitis with cholelithiasis 02/11/2018  . Multiple pulmonary nodules determined by computed tomography of lung 01/26/2018  . Upper airway cough syndrome 01/25/2018    History reviewed. No pertinent surgical history.   OB History   None      Home Medications    Prior to Admission medications   Medication Sig Start Date End Date Taking? Authorizing Provider  hydroxypropyl methylcellulose / hypromellose (ISOPTO TEARS / GONIOVISC) 2.5 % ophthalmic solution Place 1 drop into both eyes 3 (three) times daily as needed for dry eyes.   Yes [provider]  losartan (COZAAR) 50 MG tablet Take 1 tablet (50 mg total) by mouth daily. 12/22/17  Yes Isla Pence, MD  famotidine (PEPCID) 20 MG tablet One at bedtime Patient not taking: Reported on 02/11/2018  01/25/18   Tanda Rockers, MD  meclizine (ANTIVERT) 25 MG tablet Take 1 tablet (25 mg total) by mouth 3 (three) times daily as needed for dizziness. Patient not taking: Reported on 02/11/2018 12/22/17   Isla Pence, MD  ondansetron (ZOFRAN ODT) 4 MG disintegrating tablet Take 1 tablet (4 mg total) by mouth every 8 (eight) hours as needed. Patient not taking: Reported on 02/11/2018 12/22/17   Isla Pence, MD  pantoprazole (PROTONIX) 40 MG tablet Take 1 tablet (40 mg total) by mouth daily. Take 30-60 min before first meal of the day Patient not taking: Reported on 02/11/2018 01/25/18   Tanda Rockers, MD    Family History No family history on file.  Social History Social History   Tobacco Use  . Smoking status: Never Smoker  . Smokeless tobacco: Never Used  Substance Use Topics  . Alcohol use: Not on file  . Drug use: Not on file     Allergies   Patient has no known allergies.   Review of Systems Review of Systems All other systems negative except as documented in the HPI. All pertinent positives and negatives as reviewed in the HPI.  Physical Exam Updated Vital Signs BP (!) 180/99 (BP Location: Left Arm)   Pulse (!) 55   Temp (!) 97.5 F (36.4 C) (Oral)   Resp 18   Ht 5\' 4"  (1.626 m)   Wt 97.5 kg (215 lb)   SpO2 99%   BMI 36.90 kg/m   Physical Exam  Constitutional: She is oriented to person, place, and time.  She appears well-developed and well-nourished. No distress.  HENT:  Head: Normocephalic and atraumatic.  Mouth/Throat: Oropharynx is clear and moist.  Eyes: Pupils are equal, round, and reactive to light.  Neck: Normal range of motion. Neck supple.  Cardiovascular: Normal rate, regular rhythm and normal heart sounds. Exam reveals no gallop and no friction rub.  No murmur heard. Pulmonary/Chest: Effort normal and breath sounds normal. No respiratory distress. She has no wheezes.  Abdominal: Soft. Normal appearance and bowel sounds are normal. She exhibits no  distension. There is tenderness in the right upper quadrant. There is positive Murphy's sign. There is no rigidity, no rebound and no guarding.  Neurological: She is alert and oriented to person, place, and time. She exhibits normal muscle tone. Coordination normal.  Skin: Skin is warm and dry. Capillary refill takes less than 2 seconds. No rash noted. No erythema.  Psychiatric: She has a normal mood and affect. Her behavior is normal.  Nursing note and vitals reviewed.    ED Treatments / Results  Labs (all labs ordered are listed, but only abnormal results are displayed) Labs Reviewed  BASIC METABOLIC PANEL - Abnormal; Notable for the following components:      Result Value   Chloride 100 (*)    Glucose, Bld 146 (*)    Creatinine, Ser 1.18 (*)    GFR calc non Af Amer 47 (*)    GFR calc Af Amer 54 (*)    All other components within normal limits  HEPATIC FUNCTION PANEL - Abnormal; Notable for the following components:   Total Protein 8.5 (*)    Indirect Bilirubin 0.2 (*)    All other components within normal limits  CBC WITH DIFFERENTIAL/PLATELET  LIPASE, BLOOD    EKG None  Radiology US Abdomen Limited  Result Date: 02/11/2018 CLINICAL DATA:  RIGHT upper quadrant pain. EXAM: ULTRASOUND ABDOMEN LIMITED RIGHT UPPER QUADRANT COMPARISON:  None. FINDINGS: Gallbladder: Small pericholecystic fluid. Mild gallbladder wall thickening. Multiple echogenic calculi. Sludge intermixed with a calculi positive sonographic Murphy's sign Common bile duct: Diameter: Normal at 4 mm Liver: No focal lesion identified. Within normal limits in parenchymal echogenicity. Portal vein is patent on color Doppler imaging with normal direction of blood flow towards the liver. IMPRESSION: 1. Multiple gallstones, wall thickening and positive sonographic Murphy's sign are findings which suggest acute cholecystitis. Recommend clinical correlation. 2. No biliary duct dilatation. Electronically Signed   By: Suzy Bouchard M.D.   On: 02/11/2018 13:59    Procedures Procedures (including critical care time)  Medications Ordered in ED Medications  HYDROmorphone (DILAUDID) injection 1 mg (has no administration in time range)  ondansetron (ZOFRAN) injection 4 mg (has no administration in time range)  sodium chloride 0.9 % bolus 1,000 mL (0 mLs Intravenous Stopped 02/11/18 1035)  ondansetron (ZOFRAN) injection 4 mg (4 mg Intravenous Given 02/11/18 0757)  fentaNYL (SUBLIMAZE) injection 50 mcg (50 mcg Intravenous Given 02/11/18 0849)  fentaNYL (SUBLIMAZE) injection 50 mcg (50 mcg Intravenous Given 02/11/18 1055)  fentaNYL (SUBLIMAZE) injection 100 mcg (100 mcg Intravenous Given 02/11/18 1311)     Initial Impression / Assessment and Plan / ED Course  I have reviewed the triage vital signs and the nursing notes.  Pertinent labs & imaging results that were available during my care of the patient were reviewed by me and considered in my medical decision making (see chart for details).     I spoke with Dr. Ninfa Linden from general surgery who will evaluate the patient.  There is  a significant delay from ultrasound and that is why her stay in the emergency department was significantly prolonged.  Dr. Ninfa Linden will admit the patient for further evaluation and care of her cholecystitis. Final Clinical Impressions(s) / ED Diagnoses   Final diagnoses:  None    ED Discharge Orders    None       Dalia Heading, PA-C 02/11/18 1458    Carmin Muskrat, MD 02/11/18 [phone]

## 2018-02-12 ENCOUNTER — Encounter (HOSPITAL_COMMUNITY): Admission: EM | Disposition: A | Discharge status: Home / Self Care | Payer: Self-pay | Source: Home / Self Care

## 2018-02-12 ENCOUNTER — Encounter (HOSPITAL_COMMUNITY): Payer: Self-pay | Admitting: Emergency Medicine

## 2018-02-12 ENCOUNTER — Inpatient Hospital Stay (HOSPITAL_COMMUNITY): Payer: Medicare Other | Admitting: Anesthesiology

## 2018-02-12 DIAGNOSIS — K8000 Calculus of gallbladder with acute cholecystitis without obstruction: Secondary | ICD-10-CM | POA: Diagnosis not present

## 2018-02-12 HISTORY — PX: CHOLECYSTECTOMY: SHX55

## 2018-02-12 LAB — CBC
HCT: 41.9 % (ref 36.0–46.0)
Hemoglobin: 13.9 g/dL (ref 12.0–15.0)
MCH: 27.9 pg (ref 26.0–34.0)
MCHC: 33.2 g/dL (ref 30.0–36.0)
MCV: 84 fL (ref 78.0–100.0)
PLATELETS: 215 10*3/uL (ref 150–400)
RBC: 4.99 MIL/uL (ref 3.87–5.11)
RDW: 13.9 % (ref 11.5–15.5)
WBC: 18.5 10*3/uL — AB (ref 4.0–10.5)

## 2018-02-12 LAB — COMPREHENSIVE METABOLIC PANEL
ALT: 125 U/L — AB (ref 14–54)
AST: 159 U/L — AB (ref 15–41)
Albumin: 3.7 g/dL (ref 3.5–5.0)
Alkaline Phosphatase: 75 U/L (ref 38–126)
Anion gap: 12 (ref 5–15)
BILIRUBIN TOTAL: 1 mg/dL (ref 0.3–1.2)
BUN: 16 mg/dL (ref 6–20)
CO2: 20 mmol/L — ABNORMAL LOW (ref 22–32)
CREATININE: 1.09 mg/dL — AB (ref 0.44–1.00)
Calcium: 9.1 mg/dL (ref 8.9–10.3)
Chloride: 103 mmol/L (ref 101–111)
GFR calc Af Amer: 59 mL/min — ABNORMAL LOW (ref >60)
GFR, EST NON AFRICAN AMERICAN: 51 mL/min — AB (ref >60)
GLUCOSE: 152 mg/dL — AB (ref 65–99)
Potassium: 4.1 mmol/L (ref 3.5–5.1)
Sodium: 135 mmol/L (ref 135–145)
TOTAL PROTEIN: 7.2 g/dL (ref 6.5–8.1)

## 2018-02-12 SURGERY — LAPAROSCOPIC CHOLECYSTECTOMY WITH INTRAOPERATIVE CHOLANGIOGRAM
Anesthesia: General | Site: Abdomen

## 2018-02-12 MED ORDER — PROMETHAZINE HCL 25 MG/ML IJ SOLN: 6.2500 mg | INTRAMUSCULAR | Status: DC | PRN

## 2018-02-12 MED ORDER — LIDOCAINE 2% (20 MG/ML) 5 ML SYRINGE
INTRAMUSCULAR | Status: DC | PRN
  Administered 2018-02-12: 100 mg via INTRAVENOUS

## 2018-02-12 MED ORDER — ROCURONIUM BROMIDE 10 MG/ML (PF) SYRINGE
PREFILLED_SYRINGE | INTRAVENOUS | Status: AC
  Filled 2018-02-12: qty 5

## 2018-02-12 MED ORDER — LACTATED RINGERS IR SOLN
Status: DC | PRN
  Administered 2018-02-12: 4000 mL

## 2018-02-12 MED ORDER — PROPOFOL 10 MG/ML IV BOLUS
INTRAVENOUS | Status: AC
  Filled 2018-02-12: qty 20

## 2018-02-12 MED ORDER — SUCCINYLCHOLINE CHLORIDE 200 MG/10ML IV SOSY
PREFILLED_SYRINGE | INTRAVENOUS | Status: DC | PRN
  Administered 2018-02-12: 120 mg via INTRAVENOUS

## 2018-02-12 MED ORDER — MORPHINE SULFATE (PF) 2 MG/ML IV SOLN
1.0000 mg | INTRAVENOUS | Status: DC | PRN
  Administered 2018-02-12: 2 mg via INTRAVENOUS
  Filled 2018-02-12: qty 1

## 2018-02-12 MED ORDER — HYDROMORPHONE HCL 1 MG/ML IJ SOLN
0.2500 mg | INTRAMUSCULAR | Status: DC | PRN
  Administered 2018-02-12 (×2): 0.5 mg via INTRAVENOUS

## 2018-02-12 MED ORDER — HYDROMORPHONE HCL 1 MG/ML IJ SOLN
INTRAMUSCULAR | Status: AC
  Filled 2018-02-12: qty 1

## 2018-02-12 MED ORDER — MIDAZOLAM HCL 5 MG/5ML IJ SOLN
INTRAMUSCULAR | Status: DC | PRN
  Administered 2018-02-12: 2 mg via INTRAVENOUS

## 2018-02-12 MED ORDER — MIDAZOLAM HCL 2 MG/2ML IJ SOLN
INTRAMUSCULAR | Status: AC
  Filled 2018-02-12: qty 2

## 2018-02-12 MED ORDER — SUCCINYLCHOLINE CHLORIDE 200 MG/10ML IV SOSY
PREFILLED_SYRINGE | INTRAVENOUS | Status: AC
  Filled 2018-02-12: qty 10

## 2018-02-12 MED ORDER — FENTANYL CITRATE (PF) 100 MCG/2ML IJ SOLN
INTRAMUSCULAR | Status: DC | PRN
  Administered 2018-02-12 (×2): 50 ug via INTRAVENOUS

## 2018-02-12 MED ORDER — PROPOFOL 10 MG/ML IV BOLUS
INTRAVENOUS | Status: DC | PRN
  Administered 2018-02-12: 120 mg via INTRAVENOUS

## 2018-02-12 MED ORDER — LACTATED RINGERS IV SOLN
INTRAVENOUS | Status: DC
  Administered 2018-02-12 (×2): via INTRAVENOUS

## 2018-02-12 MED ORDER — DEXAMETHASONE SODIUM PHOSPHATE 10 MG/ML IJ SOLN
INTRAMUSCULAR | Status: DC | PRN
  Administered 2018-02-12: 10 mg via INTRAVENOUS

## 2018-02-12 MED ORDER — LIDOCAINE 2% (20 MG/ML) 5 ML SYRINGE
INTRAMUSCULAR | Status: AC
  Filled 2018-02-12: qty 5

## 2018-02-12 MED ORDER — 0.9 % SODIUM CHLORIDE (POUR BTL) OPTIME
TOPICAL | Status: DC | PRN
  Administered 2018-02-12: 1000 mL

## 2018-02-12 MED ORDER — ONDANSETRON HCL 4 MG/2ML IJ SOLN
INTRAMUSCULAR | Status: DC | PRN
  Administered 2018-02-12: 4 mg via INTRAVENOUS

## 2018-02-12 MED ORDER — FENTANYL CITRATE (PF) 100 MCG/2ML IJ SOLN
INTRAMUSCULAR | Status: AC
  Filled 2018-02-12: qty 2

## 2018-02-12 MED ORDER — BUPIVACAINE-EPINEPHRINE 0.5% -1:200000 IJ SOLN
INTRAMUSCULAR | Status: DC | PRN
  Administered 2018-02-12: 27 mL

## 2018-02-12 MED ORDER — ONDANSETRON HCL 4 MG/2ML IJ SOLN
INTRAMUSCULAR | Status: AC
  Filled 2018-02-12: qty 2

## 2018-02-12 MED ORDER — ROCURONIUM BROMIDE 10 MG/ML (PF) SYRINGE
PREFILLED_SYRINGE | INTRAVENOUS | Status: DC | PRN
  Administered 2018-02-12: 40 mg via INTRAVENOUS

## 2018-02-12 MED ORDER — SUGAMMADEX SODIUM 200 MG/2ML IV SOLN
INTRAVENOUS | Status: AC
  Filled 2018-02-12: qty 2

## 2018-02-12 MED ORDER — BUPIVACAINE-EPINEPHRINE 0.5% -1:200000 IJ SOLN
INTRAMUSCULAR | Status: AC
  Filled 2018-02-12: qty 1

## 2018-02-12 MED ORDER — SUGAMMADEX SODIUM 200 MG/2ML IV SOLN
INTRAVENOUS | Status: DC | PRN
  Administered 2018-02-12: 200 mg via INTRAVENOUS

## 2018-02-12 MED ORDER — LIP MEDEX EX OINT
TOPICAL_OINTMENT | CUTANEOUS | Status: AC
  Filled 2018-02-12: qty 7

## 2018-02-12 MED ORDER — PHENYLEPHRINE 40 MCG/ML (10ML) SYRINGE FOR IV PUSH (FOR BLOOD PRESSURE SUPPORT)
PREFILLED_SYRINGE | INTRAVENOUS | Status: DC | PRN
  Administered 2018-02-12 (×2): 80 ug via INTRAVENOUS

## 2018-02-12 MED ORDER — ENOXAPARIN SODIUM 40 MG/0.4ML ~~LOC~~ SOLN
40.0000 mg | SUBCUTANEOUS | Status: DC
  Administered 2018-02-13: 40 mg via SUBCUTANEOUS
  Filled 2018-02-12: qty 0.4

## 2018-02-12 MED ORDER — DEXAMETHASONE SODIUM PHOSPHATE 10 MG/ML IJ SOLN
INTRAMUSCULAR | Status: AC
  Filled 2018-02-12: qty 1

## 2018-02-12 MED ORDER — PHENYLEPHRINE 40 MCG/ML (10ML) SYRINGE FOR IV PUSH (FOR BLOOD PRESSURE SUPPORT)
PREFILLED_SYRINGE | INTRAVENOUS | Status: AC
  Filled 2018-02-12: qty 10

## 2018-02-12 SURGICAL SUPPLY — 37 items
APPLIER CLIP 5 13 M/L LIGAMAX5 (MISCELLANEOUS) ×2
CABLE HIGH FREQUENCY MONO STRZ (ELECTRODE) ×2 IMPLANT
CHLORAPREP W/TINT 26ML (MISCELLANEOUS) ×2 IMPLANT
CLIP APPLIE 5 13 M/L LIGAMAX5 (MISCELLANEOUS) ×1 IMPLANT
COVER MAYO STAND STRL (DRAPES) IMPLANT
DERMABOND ADVANCED (GAUZE/BANDAGES/DRESSINGS) ×1
DERMABOND ADVANCED .7 DNX12 (GAUZE/BANDAGES/DRESSINGS) ×1 IMPLANT
DRAIN CHANNEL 19F RND (DRAIN) ×2 IMPLANT
DRAPE C-ARM 42X120 X-RAY (DRAPES) IMPLANT
DRAPE LAPAROSCOPIC ABDOMINAL (DRAPES) ×2 IMPLANT
DRSG TEGADERM 4X4.75 (GAUZE/BANDAGES/DRESSINGS) ×2 IMPLANT
ELECT REM PT RETURN 15FT ADLT (MISCELLANEOUS) ×2 IMPLANT
EVACUATOR SILICONE 100CC (DRAIN) ×2 IMPLANT
GLOVE BIO SURGEON STRL SZ 6.5 (GLOVE) ×2 IMPLANT
GLOVE BIOGEL PI IND STRL 7.0 (GLOVE) ×1 IMPLANT
GLOVE BIOGEL PI INDICATOR 7.0 (GLOVE) ×1
GOWN STRL REUS W/TWL 2XL LVL3 (GOWN DISPOSABLE) ×2 IMPLANT
GOWN STRL REUS W/TWL XL LVL3 (GOWN DISPOSABLE) ×6 IMPLANT
HEMOSTAT SNOW SURGICEL 2X4 (HEMOSTASIS) ×2 IMPLANT
IRRIG SUCT STRYKERFLOW 2 WTIP (MISCELLANEOUS) ×2
IRRIGATION SUCT STRKRFLW 2 WTP (MISCELLANEOUS) ×1 IMPLANT
IV CATH 14GX2 1/4 (CATHETERS) IMPLANT
KIT BASIN OR (CUSTOM PROCEDURE TRAY) ×2 IMPLANT
POUCH SPECIMEN RETRIEVAL 10MM (ENDOMECHANICALS) ×2 IMPLANT
SCISSORS LAP 5X35 DISP (ENDOMECHANICALS) ×2 IMPLANT
SET CHOLANGIOGRAPH MIX (MISCELLANEOUS) IMPLANT
SLEEVE XCEL OPT CAN 5 100 (ENDOMECHANICALS) ×4 IMPLANT
SPONGE DRAIN TRACH 4X4 STRL 2S (GAUZE/BANDAGES/DRESSINGS) ×2 IMPLANT
SUT ETHILON 2 0 PS N (SUTURE) ×2 IMPLANT
SUT VIC AB 2-0 SH 27 (SUTURE)
SUT VIC AB 2-0 SH 27X BRD (SUTURE) IMPLANT
SUT VIC AB 4-0 PS2 18 (SUTURE) ×4 IMPLANT
TOWEL OR 17X26 10 PK STRL BLUE (TOWEL DISPOSABLE) ×2 IMPLANT
TOWEL OR NON WOVEN STRL DISP B (DISPOSABLE) ×2 IMPLANT
TRAY LAPAROSCOPIC (CUSTOM PROCEDURE TRAY) ×2 IMPLANT
TROCAR XCEL BLUNT TIP 100MML (ENDOMECHANICALS) ×2 IMPLANT
TUBING INSUF HEATED (TUBING) ×2 IMPLANT

## 2018-02-12 NOTE — Anesthesia Preprocedure Evaluation (Addendum)
Anesthesia Evaluation  Patient identified by MRN, date of birth, ID band Patient awake    Reviewed: Allergy & Precautions, NPO status , Patient's Chart, lab work & pertinent test results  Airway Mallampati: II  TM Distance: >3 FB Neck ROM: Full    Dental no notable dental hx.    Pulmonary neg pulmonary ROS,    Pulmonary exam normal breath sounds clear to auscultation       Cardiovascular hypertension, Normal cardiovascular exam Rhythm:Regular Rate:Normal     Neuro/Psych negative neurological ROS  negative psych ROS   GI/Hepatic negative GI ROS, Neg liver ROS,   Endo/Other  negative endocrine ROS  Renal/GU negative Renal ROS  negative genitourinary   Musculoskeletal negative musculoskeletal ROS (+)   Abdominal   Peds negative pediatric ROS (+)  Hematology negative hematology ROS (+)   Anesthesia Other Findings   Reproductive/Obstetrics negative OB ROS                             Anesthesia Physical Anesthesia Plan  ASA: II  Anesthesia Plan: General   Post-op Pain Management:    Induction: Intravenous  PONV Risk Score and Plan: 3 and Ondansetron, Dexamethasone and Treatment may vary due to age or medical condition  Airway Management Planned: Oral ETT  Additional Equipment:   Intra-op Plan:   Post-operative Plan: Extubation in OR  Informed Consent: I have reviewed the patients History and Physical, chart, labs and discussed the procedure including the risks, benefits and alternatives for the proposed anesthesia with the patient or authorized representative who has indicated his/her understanding and acceptance.     Dental advisory given  Plan Discussed with: CRNA and Surgeon  Anesthesia Plan Comments:         Anesthesia Quick Evaluation  

## 2018-02-12 NOTE — Transfer of Care (Signed)
Immediate Anesthesia Transfer of Care Note  Patient: Nancy Christensen  Procedure(s) Performed: LAPAROSCOPIC CHOLECYSTECTOMY (N/A Abdomen)  Patient Location: PACU  Anesthesia Type:General  Level of Consciousness: sedated  Airway & Oxygen Therapy: Patient Spontanous Breathing and Patient connected to face mask oxygen  Post-op Assessment: Report given to RN and Post -op Vital signs reviewed and stable  Post vital signs: Reviewed and stable  Last Vitals:  Vitals Value Taken Time  BP    Temp    Pulse 84 02/12/2018 12:07 PM  Resp 18 02/12/2018 12:07 PM  SpO2 100 % 02/12/2018 12:07 PM  Vitals shown include unvalidated device data.  Last Pain:  Vitals:   02/12/18 0922  TempSrc: Oral  PainSc: 8       Patients Stated Pain Goal: 4 (74/12/87 8676)  Complications: No apparent anesthesia complications

## 2018-02-12 NOTE — Anesthesia Procedure Notes (Signed)
Procedure Name: Intubation Date/Time: 02/12/2018 10:19 AM Performed by: Lind Covert, CRNA Pre-anesthesia Checklist: Patient identified, Emergency Drugs available, Suction available, Patient being monitored and Timeout performed Patient Re-evaluated:Patient Re-evaluated prior to induction Oxygen Delivery Method: Circle system utilized Preoxygenation: Pre-oxygenation with 100% oxygen Induction Type: IV induction Laryngoscope Size: Mac and 4 Grade View: Grade I Tube type: Oral Tube size: 7.0 mm Number of attempts: 1 Airway Equipment and Method: Stylet Placement Confirmation: ETT inserted through vocal cords under direct vision,  positive ETCO2 and breath sounds checked- equal and bilateral Secured at: 22 cm Tube secured with: Tape Dental Injury: Teeth and Oropharynx as per pre-operative assessment

## 2018-02-12 NOTE — Discharge Summary (Signed)
     Patient ID: Nancy Christensen [id number] January 23, 1951 67 y.o.  Admit date: 02/11/2018 Discharge date: 02/13/2018  Admitting Diagnosis: Acute cholecystitis  Discharge Diagnosis Patient Active Problem List   Diagnosis Date Noted  . Cholecystitis with cholelithiasis 02/11/2018  . Multiple pulmonary nodules determined by computed tomography of lung 01/26/2018  . Upper airway cough syndrome 01/25/2018    Consultants none  Reason for Admission: This is a 67 year old female who presented this morning with sharp epigastric abdominal pain, nausea, and vomiting.  This is her third attack this week.  She has had no previous similar symptoms.  She describes moderate to severe sharp pain in the epigastrium moving to the right upper quadrant and through to the back.  Bowel movements have been normal.  She denies fever.  She is otherwise without complaints.    Procedures Laparoscopic cholecystectomy  Hospital Course:  The patient was admitted and underwent a laparoscopic cholecystectomy with JP drain placement secondary to some bleeding.  The patient tolerated the procedure well.  On POD 1, the patient was tolerating a regular diet, voiding well, mobilizing, and pain was controlled with oral pain medications.  Her JP drain was as expected, serosanguineous, and not outright bloody.  This was stable for removal.  The patient was otherwise stable for DC home at this time with appropriate follow up made.     Physical Exam: Abd: soft, appropriately tender, +BS, ND, incisions c/d/i.  JP was serosanguineous prior to removal  Allergies as of 02/13/2018   No Known Allergies     Medication List    TAKE these medications   famotidine 20 MG tablet Commonly known as:  PEPCID One at bedtime   hydroxypropyl methylcellulose / hypromellose 2.5 % ophthalmic solution Commonly known as:  ISOPTO TEARS / GONIOVISC Place 1 drop into both eyes 3 (three) times daily as needed for dry eyes.   losartan 50  MG tablet Commonly known as:  COZAAR Take 1 tablet (50 mg total) by mouth daily.   meclizine 25 MG tablet Commonly known as:  ANTIVERT Take 1 tablet (25 mg total) by mouth 3 (three) times daily as needed for dizziness.   ondansetron 4 MG disintegrating tablet Commonly known as:  ZOFRAN ODT Take 1 tablet (4 mg total) by mouth every 8 (eight) hours as needed.   oxyCODONE 5 MG immediate release tablet Commonly known as:  Oxy IR/ROXICODONE Take 1-2 tablets (5-10 mg total) by mouth every 6 (six) hours as needed for moderate pain.   pantoprazole 40 MG tablet Commonly known as:  PROTONIX Take 1 tablet (40 mg total) by mouth daily. Take 30-60 min before first meal of the day        Follow-up Information    [facility], [place] Follow up on 02/27/2018.   Specialty:  General Surgery Why:  10:00am, arrive by 9:30am for check in and paper work.  please bring insurance card and photo ID with you Contact information: [address] [place] [id number] [phone]           Signed: Henreitta Cea 8:37 AM 02/13/2018 [facility] Surgery Pager [phone]

## 2018-02-12 NOTE — Op Note (Signed)
02/11/2018 - 02/12/2018  11:46 AM  PATIENT:  Nancy Christensen  67 y.o. female  Patient Care Team: Billie Ruddy, MD as PCP - General (Family Medicine)  PRE-OPERATIVE DIAGNOSIS:  cholecystitis   POST-OPERATIVE DIAGNOSIS:  cholecystitis   PROCEDURE:  LAPAROSCOPIC CHOLECYSTECTOMY   Surgeon(s): Leighton Ruff, MD  ASSISTANT: Keane Police, PA   ANESTHESIA:   local and general  EBL: 22ml Total I/O In: 1000 [I.V.:1000] Out: -   DRAINS: (22F) Jackson-Pratt drain(s) with closed bulb suction in the RUQ   SPECIMEN:  Source of Specimen:  gallbladder  DISPOSITION OF SPECIMEN:  PATHOLOGY  COUNTS:  YES  PLAN OF CARE: Patient admitted  PATIENT DISPOSITION:  PACU - hemodynamically stable.  INDICATION: 67 y.o. F with acute cholecystitis   The anatomy & physiology of hepatobiliary & pancreatic function was discussed.  The pathophysiology of gallbladder dysfunction was discussed.  Natural history risks without surgery was discussed.   I feel the risks of no intervention will lead to serious problems that outweigh the operative risks; therefore, I recommended cholecystectomy to remove the pathology.  I explained laparoscopic techniques with possible need for an open approach.  Probable cholangiogram to evaluate the bilary tract was explained as well.    Risks such as bleeding, infection, abscess, leak, injury to other organs, need for further treatment, heart attack, death, and other risks were discussed.  I noted a good likelihood this will help address the problem.  Possibility that this will not correct all abdominal symptoms was explained.  Goals of post-operative recovery were discussed as well.    OR FINDINGS: acute cholecystitis  DESCRIPTION:   The patient was identified & brought into the operating room. The patient was positioned supine with arms tucked. SCDs were active during the entire case. The patient underwent general anesthesia without any difficulty.  The abdomen was  prepped and draped in a sterile fashion. A Surgical Timeout was performed and confirmed our plan.  We positioned the patient in reverse [place] & right side up.  I placed a Hassan laparoscopic port through the umbilicus using open entry technique.  Entry was clean. There were no adhesions to the anterior abdominal wall supraumbilically.  We induced carbon dioxide insufflation. Camera inspection revealed no injury.    I proceeded to continue with laparoscopic technique. I placed a 5 mm port in mid subcostal region, another 56mm port in the right flank near the anterior axillary line, and a 65mm port in the left subxiphoid region obliquely within the falciform ligament.  I turned attention to the right upper quadrant.  The gallbladder was tense and had to be punctured and suctioned before it could be manipulated.  The gallbladder fundus was then elevated cephalad. I used cautery and blunt dissection to free the peritoneal coverings between the gallbladder and the liver on the posteriolateral and anteriomedial walls.   I used careful blunt and cautery dissection with a dolphin tipped dissector to help get a good critical view of the cystic artery and cystic duct. I dissected out an anterior accessory artery.  This was hemoclipped and cut.  Blood was in the lumen confirming artery and not a small cystic duct.  I did further dissection to free a few centimeters of the  gallbladder off the liver bed to get a good critical view of the infundibulum and cystic duct. I mobilized the cystic artery.  I skeletonized the cystic duct.  After getting a good 360 view, I decided not to perform a cholangiogram.  I  placed a clip on the infundibulum.   I placed clips on the cystic duct x3.  I completed cystic duct transection.   I placed clips on the cystic artery x3 with 2 proximally.  I ligated the cystic artery using scissors. I freed the gallbladder from its remaining attachments to the liver. I ensured hemostasis on the  gallbladder fossa of the liver and elsewhere.  There was not adequate hemostasis with application of cautery, so I applied surgical "snow" to the liver bed.  I also placed a 25F blake drain into the gallbladder fossa.  I inspected the rest of the abdomen & detected no injury nor bleeding elsewhere.  I irrigated the RUQ with normal saline.  There did not appear to be any further active bleeding.  I removed the gallbladder through the umbilical port site.  I closed the umbilical fascia using a 0 Vicryl stitch x1.   The subcutaneous tissue was re-approximated using a 2-0 Vicryl suture.  I closed the skin using 4-0 vicryl stitch.  Sterile dressings were applied. The patient was extubated & arrived in the PACU in stable condition.  I had discussed postoperative care with the patient in the holding area.   I will discuss  operative findings and postoperative goals / instructions with the patient's family.  Instructions are written in the chart as well.

## 2018-02-12 NOTE — Discharge Instructions (Signed)
Your appointment is at 10:00am, please arrive at least 30 min before your appointment to complete your check in paperwork.  If you are unable to arrive 30 min prior to your appointment time we may have to cancel or reschedule you.  LAPAROSCOPIC SURGERY: POST OP INSTRUCTIONS  1. DIET: Follow a light bland diet the first 24 hours after arrival home, such as soup, liquids, crackers, etc. Be sure to include lots of fluids daily. Avoid fast food or heavy meals as your are more likely to get nauseated. Eat a low fat the next few days after surgery.  2. Take your usually prescribed home medications unless otherwise directed. 3. PAIN CONTROL:  1. Pain is best controlled by a usual combination of three different methods TOGETHER:  i. Ice/Heat ii. Over the counter pain medication iii. Prescription pain medication 2. Most patients will experience some swelling and bruising around the incisions. Ice packs or heating pads (30-60 minutes up to 6 times a day) will help. Use ice for the first few days to help decrease swelling and bruising, then switch to heat to help relax tight/sore spots and speed recovery. Some people prefer to use ice alone, heat alone, alternating between ice & heat. Experiment to what works for you. Swelling and bruising can take several weeks to resolve.  3. It is helpful to take an over-the-counter pain medication regularly for the first few weeks. Choose one of the following that works best for you:  i. Naproxen (Aleve, etc) Two 220mg  tabs twice a day ii. Ibuprofen (Advil, etc) Three 200mg  tabs four times a day (every meal & bedtime) iii. Acetaminophen (Tylenol, etc) 500-650mg  four times a day (every meal & bedtime) 4. A prescription for pain medication (such as oxycodone, hydrocodone, etc) should be given to you upon discharge. Take your pain medication as prescribed.  i. If you are having problems/concerns with the prescription medicine (does not control pain, nausea, vomiting, rash,  itching, etc), please call us [phone] to see if we need to switch you to a different pain medicine that will work better for you and/or control your side effect better. ii. If you need a refill on your pain medication, please contact your pharmacy. They will contact our office to request authorization. Prescriptions will not be filled after 5 pm or on week-ends. 1. Avoid getting constipated. Between the surgery and the pain medications, it is common to experience some constipation. Increasing fluid intake and taking a fiber supplement (such as Metamucil, Citrucel, FiberCon, MiraLax, etc) 1-2 times a day regularly will usually help prevent this problem from occurring. A mild laxative (prune juice, Milk of Magnesia, MiraLax, etc) should be taken according to package directions if there are no bowel movements after 48 hours.  2. Watch out for diarrhea. If you have many loose bowel movements, simplify your diet to bland foods & liquids for a few days. Stop any stool softeners and decrease your fiber supplement. Switching to mild anti-diarrheal medications (Kayopectate, Pepto Bismol) can help. If this worsens or does not improve, please call us. 3. Wash / shower every day. You may shower over the dressings as they are waterproof. Continue to shower over incision(s) after the dressing is off. 4. Remove your waterproof bandages 5 days after surgery. You may leave the incision open to air. You may replace a dressing/Band-Aid to cover the incision for comfort if you wish.  5. ACTIVITIES as tolerated:  a. You may resume regular (light) daily activities beginning the next day--such as  daily self-care, walking, climbing stairs--gradually increasing activities as tolerated. If you can walk 30 minutes without difficulty, it is safe to try more intense activity such as jogging, treadmill, bicycling, low-impact aerobics, swimming, etc. b. Save the most intensive and strenuous activity for last such as sit-ups, heavy  lifting, contact sports, etc Refrain from any heavy lifting or straining until you are off narcotics for pain control.  c. DO NOT PUSH THROUGH PAIN. Let pain be your guide: If it hurts to do something, don't do it. Pain is your body warning you to avoid that activity for another week until the pain goes down. d. You may drive when you are no longer taking prescription pain medication, you can comfortably wear a seatbelt, and you can safely maneuver your car and apply brakes. e. Dennis Bast may have sexual intercourse when it is comfortable.  6. FOLLOW UP in our office  a. Please call [facility] at [phone] to set up an appointment to see your surgeon in the office for a follow-up appointment approximately 2-3 weeks after your surgery. b. Make sure that you call for this appointment the day you arrive home to insure a convenient appointment time.      10. IF YOU HAVE DISABILITY OR FAMILY LEAVE FORMS, BRING THEM TO THE               OFFICE FOR PROCESSING.   WHEN TO CALL us [phone]:  1. Poor pain control 2. Reactions / problems with new medications (rash/itching, nausea, etc)  3. Fever over 101.5 F (38.5 C) 4. Inability to urinate 5. Nausea and/or vomiting 6. Worsening swelling or bruising 7. Continued bleeding from incision. 8. Increased pain, redness, or drainage from the incision  The clinic staff is available to answer your questions during regular business hours (8:30am-5pm). Please dont hesitate to call and ask to speak to one of our nurses for clinical concerns.  If you have a medical emergency, go to the nearest emergency room or call 911.  A surgeon from [facility] is always on call at the [facility], [place], [place], [place], [place] [address] ?  MAIN: [phone] ? TOLL FREE: [phone] ?  FAX [phone]) [phone]  [email or web address]

## 2018-02-12 NOTE — Progress Notes (Signed)
Cholecystitis  Subjective: Continues to have RUQ pain  Objective: Vital signs in last 24 hours: Temp:  [98.2 F (36.8 C)-100.4 F (38 C)] 100.4 F (38 C) (05/20 0611) Pulse Rate:  [55-81] 81 (05/20 0611) Resp:  [17-20] 17 (05/20 0611) BP: (137-184)/(84-104) 171/101 (05/20 0611) SpO2:  [96 %-99 %] 97 % (05/20 0611) Last BM Date: 02/10/18  Intake/Output from previous day: 05/19 0701 - 05/20 0700 In: 1000 [IV Piggyback:1000] Out: -  Intake/Output this shift: No intake/output data recorded.  General appearance: alert and cooperative GI: normal findings: TTP RUQ  Lab Results:  Results for orders placed or performed during the hospital encounter of 02/11/18 (from the past 24 hour(s))  Surgical PCR screen     Status: None   Collection Time: 02/11/18  4:49 PM  Result Value Ref Range   MRSA, PCR NEGATIVE NEGATIVE   Staphylococcus aureus NEGATIVE NEGATIVE  Comprehensive metabolic panel     Status: Abnormal   Collection Time: 02/12/18  4:33 AM  Result Value Ref Range   Sodium 135 135 - 145 mmol/L   Potassium 4.1 3.5 - 5.1 mmol/L   Chloride 103 101 - 111 mmol/L   CO2 20 (L) 22 - 32 mmol/L   Glucose, Bld 152 (H) 65 - 99 mg/dL   BUN 16 6 - 20 mg/dL   Creatinine, Ser 1.09 (H) 0.44 - 1.00 mg/dL   Calcium 9.1 8.9 - 10.3 mg/dL   Total Protein 7.2 6.5 - 8.1 g/dL   Albumin 3.7 3.5 - 5.0 g/dL   AST 159 (H) 15 - 41 U/L   ALT 125 (H) 14 - 54 U/L   Alkaline Phosphatase 75 38 - 126 U/L   Total Bilirubin 1.0 0.3 - 1.2 mg/dL   GFR calc non Af Amer 51 (L) >60 mL/min   GFR calc Af Amer 59 (L) >60 mL/min   Anion gap 12 5 - 15  CBC     Status: Abnormal   Collection Time: 02/12/18  4:33 AM  Result Value Ref Range   WBC 18.5 (H) 4.0 - 10.5 K/uL   RBC 4.99 3.87 - 5.11 MIL/uL   Hemoglobin 13.9 12.0 - 15.0 g/dL   HCT 41.9 36.0 - 46.0 %   MCV 84.0 78.0 - 100.0 fL   MCH 27.9 26.0 - 34.0 pg   MCHC 33.2 30.0 - 36.0 g/dL   RDW 13.9 11.5 - 15.5 %   Platelets 215 150 - 400 K/uL      Studies/Results Radiology     MEDS, Scheduled . enoxaparin (LOVENOX) injection  40 mg Subcutaneous Q24H  . losartan  50 mg Oral Daily     Assessment: Acute cholecystitis with no signs of obstructive jaundice  Plan: OR today for lap chole, possible IOC.  The anatomy & physiology of hepatobiliary & pancreatic function was discussed.  The pathophysiology of gallbladder dysfunction was discussed.  Natural history risks without surgery was discussed.   I feel the risks of no intervention will lead to serious problems that outweigh the operative risks; therefore, I recommended cholecystectomy to remove the pathology.  I explained laparoscopic techniques with possible need for an open approach.  Probable cholangiogram to evaluate the bilary tract was explained as well.    Risks such as bleeding, infection, abscess, leak, injury to other organs, need for further treatment, heart attack, death, and other risks were discussed.  I noted a good likelihood this will help address the problem.  Possibility that this will not correct all abdominal symptoms  was explained.  Goals of post-operative recovery were discussed as well.  We will work to minimize complications.  An educational handout further explaining the pathology and treatment options was given as well.  Questions were answered.  The patient expresses understanding & wishes to proceed with surgery.  LOS: 1 day    Rosario Adie, [facility] [place]   02/12/2018 8:54 AM

## 2018-02-12 NOTE — Anesthesia Postprocedure Evaluation (Signed)
Anesthesia Post Note  Patient: Nancy Christensen  Procedure(s) Performed: LAPAROSCOPIC CHOLECYSTECTOMY (N/A Abdomen)     Patient location during evaluation: PACU Anesthesia Type: General Level of consciousness: awake and alert Pain management: pain level controlled Vital Signs Assessment: post-procedure vital signs reviewed and stable Respiratory status: spontaneous breathing, nonlabored ventilation, respiratory function stable and patient connected to nasal cannula oxygen Cardiovascular status: blood pressure returned to baseline and stable Postop Assessment: no apparent nausea or vomiting Anesthetic complications: no    Last Vitals:  Vitals:   02/12/18 1324 02/12/18 1446  BP: 139/81 137/75  Pulse:  68  Resp: 14 15  Temp: 37.1 C 36.9 C  SpO2: 99% 99%    Last Pain:  Vitals:   02/12/18 1446  TempSrc: Oral  PainSc:                  Bj Morlock S

## 2018-02-12 NOTE — Plan of Care (Signed)
Pt alert and oriented, resting w minimal complaints of pain. Doing well post-op. Tolerating clear liquids, will ambulate when pt more awake.

## 2018-02-13 ENCOUNTER — Encounter (HOSPITAL_COMMUNITY): Payer: Self-pay | Admitting: General Surgery

## 2018-02-13 MED ORDER — OXYCODONE HCL 5 MG PO TABS: 5.0000 mg | ORAL_TABLET | Freq: Four times a day (QID) | ORAL | 0 refills | Status: DC | PRN

## 2018-02-13 NOTE — Progress Notes (Signed)
Discharge and medication instructions reviewed with patient. Questions answered and patient denies further questions. No prescriptions given to patient. Per patient, "my brother will arrive soon to drive me home". Donne Hazel, RN

## 2018-02-14 ENCOUNTER — Ambulatory Visit: Payer: Medicare Other | Admitting: Family Medicine

## 2018-02-22 ENCOUNTER — Encounter: Payer: Self-pay | Admitting: Internal Medicine

## 2018-02-22 ENCOUNTER — Ambulatory Visit (INDEPENDENT_AMBULATORY_CARE_PROVIDER_SITE_OTHER): Payer: Medicare Other | Admitting: Internal Medicine

## 2018-02-22 VITALS — BP 132/84 | HR 60 | Ht 64.5 in | Wt 212.2 lb

## 2018-02-22 DIAGNOSIS — R05 Cough: Secondary | ICD-10-CM

## 2018-02-22 DIAGNOSIS — R058 Other specified cough: Secondary | ICD-10-CM

## 2018-02-22 MED ORDER — PREDNISONE 10 MG PO TABS: ORAL_TABLET | ORAL | 0 refills | Status: DC

## 2018-02-22 MED ORDER — PANTOPRAZOLE SODIUM 40 MG PO TBEC: 40.0000 mg | DELAYED_RELEASE_TABLET | Freq: Every day | ORAL | Status: DC

## 2018-02-22 MED ORDER — BENZONATATE 200 MG PO CAPS: 200.0000 mg | ORAL_CAPSULE | Freq: Three times a day (TID) | ORAL | 2 refills | Status: DC | PRN

## 2018-02-22 MED ORDER — FAMOTIDINE 20 MG PO TABS: ORAL_TABLET | ORAL | Status: DC

## 2018-02-22 NOTE — Patient Instructions (Addendum)
GERD (REFLUX)  is an extremely common cause of respiratory symptoms just like yours , many times with no obvious heartburn at all.    It can be treated with medication, but also with lifestyle changes including elevation of the head of your bed (ideally with 6 inch  bed blocks),  Smoking cessation, avoidance of late meals, excessive alcohol, and avoid fatty foods, chocolate, peppermint, colas, red wine, and acidic juices such as orange juice.  NO MINT OR MENTHOL PRODUCTS SO NO COUGH DROPS   USE SUGARLESS CANDY INSTEAD (Jolley ranchers or Stover's or Life Savers) or even ice chips will also do - the key is to swallow to prevent all throat clearing. NO OIL BASED VITAMINS - use powdered substitutes.   Pantoprazole (protonix) 40 mg   Take  30-60 min before first meal of the day and Pepcid (famotidine)  20 mg one @  bedtime until return to office - this is the best way to tell whether stomach acid is contributing to your problem.    Prednisone 10 mg take  4 each am x 2 days,   2 each am x 2 days,  1 each am x 2 days and stop    For drainage / throat tickle try take CHLORPHENIRAMINE  4 mg - take one every 4 hours as needed - available over the counter- may cause drowsiness so start with just a bedtime dose or two and see how you tolerate it before trying in daytim  For tickle in throat> tessalon pearls 200 mg up to three times as needed (won't make you sleepy)      Please schedule a follow up office visit in 4 weeks, sooner if needed  with all medications /inhalers/ solutions in hand so we can verify exactly what you are taking. This includes all medications from all doctors and over the counters

## 2018-02-22 NOTE — Progress Notes (Signed)
Subjective:     Patient ID: Nancy Christensen, female   DOB: 02-23-1951,    MRN: [id number]    Brief patient profile:  28 yobf never smoker with pattern of "bronchitis" while living in [place] that impoved off ACEi in 212/2015 with w/u reportedly neg CT chest and retired to [employer] in Nov 2018 and started there in  Dec 2018 with intermittent  chest discomfort L upper with hbp > neg cards eval but incidental SPN's > pulmonary eval around Jan 2019 > rec reheck in 6 months and so referred to pulmonary clinic 01/25/2018 by Dr   Volanda Napoleon    History of Present Illness  01/25/2018 1st [facility] office visit/ Valerio Pinard   Chief Complaint  Patient presents with  . Pulmonary Consult    Referred by Dr. Volanda Napoleon for eval of pulmonary nodules. Pt c/o cough "for years". She went to ED while in [place] in Jan 2018 with left chest discomfort and CT Chest was done. She states she feels like she needs to cough up sputum, but it gets caught in her throat. Cough is esp worse at night when she lies down.   onset of cough  Was  1980  while living in  [place]  esp p stirring around assoc with pnds eval by allergy > neg  Worse with voice use/ perfumes/ or cold air/Neg foods/ bending urge to clear the throat during the day and immediately on supine at hs but then settles down and sleeps ok  Cp's = paresthesia like x 5 sec random from 0 - 10 x daily s pattern never noct  And are milder now than when prompted original eval "like water running down the front of my chest"  rec Pantoprazole (protonix) 40 mg   Take  30-60 min before first meal of the day and Pepcid (famotidine)  20 mg one @  bedtime until return to office - this is the best way to tell whether stomach acid is contributing to your problem.   GERD diet  Please remember to go to the lab department downstairs in the basement  for your tests - we will call you with the results when they are available.  Please schedule a follow up office visit in 4 weeks, sooner  if needed  with all medications /inhalers/ solutions in hand so we can verify exactly what you are taking. This includes all medications from all doctors and over the counters     02/22/2018  f/u ov/Katura Eatherly re: uacs sp et 02/12/18 for Lap chole / no longer on gerd rx / using peppermints / did not bring meds  Chief Complaint  Patient presents with  . Follow-up    Cough has improved some. She has noticed cough occurs when there is a change in the temperature. Cough has occ been prod with very min clear to yellow sputum.   Dyspnea:  Not limited by breathing from desired activities   Cough: same pattern/ notes at hs but then sleeps ok, doesn't wake her up but then "morning clearing throat x sev min x about tsp  Sleep: fine   No obvious day to day or daytime variability or assoc excess/ purulent sputum or mucus plugs or hemoptysis or cp or chest tightness, subjective wheeze or overt sinus or hb symptoms. No unusual exposure hx or h/o childhood pna/ asthma or knowledge of premature birth.  Sleeping flat   without nocturnal  or early am exacerbation  of respiratory  c/o's or need for  noct saba. Also denies any obvious fluctuation of symptoms with weather or environmental changes or other aggravating or alleviating factors except as outlined above   Current Allergies, Complete Past Medical History, Past Surgical History, Family History, and Social History were reviewed in [employer] record.  ROS  The following are not active complaints unless bolded Hoarseness, sore throat, dysphagia, dental problems, itching, sneezing,  nasal congestion or discharge of excess mucus or purulent secretions, ear ache,   fever, chills, sweats, unintended wt loss or wt gain, classically pleuritic or exertional cp,  orthopnea pnd or arm/hand swelling  or leg swelling, presyncope, palpitations, abdominal pain, anorexia, nausea, vomiting, diarrhea  or change in bowel habits or change in bladder habits,  change in stools or change in urine, dysuria, hematuria,  rash, arthralgias, visual complaints, headache, numbness, weakness or ataxia or problems with walking or coordination,  change in mood or  memory.        Current Meds  Medication Sig  . hydroxypropyl methylcellulose / hypromellose (ISOPTO TEARS / GONIOVISC) 2.5 % ophthalmic solution Place 1 drop into both eyes 3 (three) times daily as needed for dry eyes.  Marland Kitchen losartan (COZAAR) 50 MG tablet Take 1 tablet (50 mg total) by mouth daily.  . meclizine (ANTIVERT) 25 MG tablet Take 1 tablet (25 mg total) by mouth 3 (three) times daily as needed for dizziness.  . ondansetron (ZOFRAN ODT) 4 MG disintegrating tablet Take 1 tablet (4 mg total) by mouth every 8 (eight) hours as needed.  Marland Kitchen oxyCODONE (OXY IR/ROXICODONE) 5 MG immediate release tablet Take 1-2 tablets (5-10 mg total) by mouth every 6 (six) hours as needed for moderate pain.                 Objective:   Physical Exam  Amb bf nad   02/22/2018       212   01/25/18 215 lb (97.5 kg)  01/03/18 215 lb 8 oz (97.8 kg)      Vital signs reviewed - Note on arrival 02 sats  100% on RA     HEENT: nl dentition, turbinates bilaterally, and oropharynx which is pristine. Nl external ear canals without cough reflex   NECK :  without JVD/Nodes/TM/ nl carotid upstrokes bilaterally   LUNGS: no acc muscle use,  Nl contour chest which is clear to A and P bilaterally without cough on insp or exp maneuvers   CV:  RRR  no s3 or murmur or increase in P2, and no edema   ABD:  soft and nontender with nl inspiratory excursion in the supine position. No bruits or organomegaly appreciated, bowel sounds nl  MS:  Nl gait/ ext warm without deformities, calf tenderness, cyanosis or clubbing No obvious joint restrictions   SKIN: warm and dry without lesions    NEURO:  alert, approp, nl sensorium with  no motor or cerebellar deficits apparent.                   Assessment:

## 2018-02-25 ENCOUNTER — Encounter: Payer: Self-pay | Admitting: Internal Medicine

## 2018-02-25 NOTE — Assessment & Plan Note (Signed)
Onset 1980 FENO 01/25/2018  =   20 - Allergy profile 01/25/2018 >  Eos 0.1 /  IgE  17 RAST neg  - flare 02/12/18 p et   Of the three most common causes of  Sub-acute / recurrent or chronic cough, only one (GERD)  can actually contribute to/ trigger  the other two (asthma and post nasal drip syndrome)  and perpetuate the cylce of cough.  While not intuitively obvious, many patients with chronic low grade reflux do not cough until there is a primary insult that disturbs the protective epithelial barrier and exposes sensitive nerve endings.   This is typically viral but can due to PNDS and aggravated by ET tube   Either to the latter two may apply here.   The point is that once this occurs, it is difficult to eliminate the cycle  using anything but a maximally effective acid suppression regimen at least in the short run, accompanied by an appropriate diet to address non acid GERD and control / eliminate the  pnds with 1st gen H1 blockers per guidelines     I had an extended discussion with the patient reviewing all relevant studies completed to date and  lasting 15 to 20 minutes of a 25 minute visit    Each maintenance medication was reviewed in detail including most importantly the difference between maintenance and prns and under what circumstances the prns are to be triggered using an action plan format that is not reflected in the computer generated alphabetically organized AVS.    Please see AVS for specific instructions unique to this visit that I personally wrote and verbalized to the the pt in detail and then reviewed with pt  by my nurse highlighting any  changes in therapy recommended at today's visit to their plan of care.

## 2018-02-27 ENCOUNTER — Other Ambulatory Visit: Payer: Self-pay

## 2018-02-27 ENCOUNTER — Encounter: Payer: Self-pay | Admitting: Family Medicine

## 2018-02-27 ENCOUNTER — Ambulatory Visit (INDEPENDENT_AMBULATORY_CARE_PROVIDER_SITE_OTHER): Payer: Medicare Other | Admitting: Family Medicine

## 2018-02-27 VITALS — BP 138/84 | HR 60 | Temp 97.9°F | Wt 212.0 lb

## 2018-02-27 DIAGNOSIS — Z9049 Acquired absence of other specified parts of digestive tract: Secondary | ICD-10-CM

## 2018-02-27 DIAGNOSIS — H43391 Other vitreous opacities, right eye: Secondary | ICD-10-CM

## 2018-02-27 DIAGNOSIS — I10 Essential (primary) hypertension: Secondary | ICD-10-CM

## 2018-02-27 DIAGNOSIS — E782 Mixed hyperlipidemia: Secondary | ICD-10-CM

## 2018-02-27 MED ORDER — LOSARTAN POTASSIUM 50 MG PO TABS: 50.0000 mg | ORAL_TABLET | Freq: Every day | ORAL | 2 refills | Status: DC

## 2018-02-27 NOTE — Patient Instructions (Addendum)
Total cholesterol was high at 263 (nml is <200) and LDL (bad) cholesterol was 179 which is also elevated (nml is <99).  You can decrease these numbers by: -limiting your intake of processed foods and foods high in saturated fats.  -increasing your physical activity -increasing your intake of vegetables, healthy fish (bluefin tuna, wild salmon, and sardines), whole grains and fiber rich foods .   -You can also use extra virgin olive oil instead of butter. -If you smoke, quitting can decrease LDL cholesterol and raise HDL cholesterol.       Dyslipidemia Dyslipidemia is an imbalance of waxy, fat-like substances (lipids) in the blood. The body needs lipids in small amounts. Dyslipidemia often involves a high level of cholesterol or triglycerides, which are types of lipids. Common forms of dyslipidemia include:  High levels of bad cholesterol (LDL cholesterol). LDL is the type of cholesterol that causes fatty deposits (plaques) to build up in the blood vessels that carry blood away from your heart (arteries).  Low levels of good cholesterol (HDL cholesterol). HDL cholesterol is the type of cholesterol that protects against heart disease. High levels of HDL remove the LDL buildup from arteries.  High levels of triglycerides. Triglycerides are a fatty substance in the blood that is linked to a buildup of plaques in the arteries.  You can develop dyslipidemia because of the genes you are born with (primary dyslipidemia) or changes that occur during your life (secondary dyslipidemia), or as a side effect of certain medical treatments. What are the causes? Primary dyslipidemia is caused by changes (mutations) in genes that are passed down through families (inherited). These mutations cause several types of dyslipidemia. Mutations can result in disorders that make the body produce too much LDL cholesterol or triglycerides, or not enough HDL cholesterol. These disorders may lead to heart disease,  arterial disease, or stroke at an early age. Causes of secondary dyslipidemia include certain lifestyle choices and diseases that lead to dyslipidemia, such as:  Eating a diet that is high in animal fat.  Not getting enough activity or exercise (having a sedentary lifestyle).  Having diabetes, kidney disease, liver disease, or thyroid disease.  Drinking large amounts of alcohol.  Using certain types of drugs.  What increases the risk? You may be at greater risk for dyslipidemia if you are an older man or if you are a woman who has gone through menopause. Other risk factors include:  Having a family history of dyslipidemia.  Taking certain medicines, including birth control pills, steroids, some diuretics, beta-blockers, and some medicines forHIV.  Smoking cigarettes.  Eating a high-fat diet.  Drinking large amounts of alcohol.  Having certain medical conditions such as diabetes, polycystic ovary syndrome (PCOS), pregnancy, kidney disease, liver disease, or hypothyroidism.  Not exercising regularly.  Being overweight or obese with too much belly fat.  What are the signs or symptoms? Dyslipidemia does not usually cause any symptoms. Very high lipid levels can cause fatty bumps under the skin (xanthomas) or a white or gray ring around the black center (pupil) of the eye. Very high triglyceride levels can cause inflammation of the pancreas (pancreatitis). How is this diagnosed? Your health care provider may diagnose dyslipidemia based on a routine blood test (fasting blood test). Because most people do not have symptoms of the condition, this blood testing (lipid profile) is done on adults age 35 and older and is repeated every 5 years. This test checks:  Total cholesterol. This is a measure of the total amount of  cholesterol in your blood, including LDL cholesterol, HDL cholesterol, and triglycerides. A healthy number is below 200.  LDL cholesterol. The target number for LDL  cholesterol is different for each person, depending on individual risk factors. For most people, a number below 100 is healthy. Ask your health care provider what your LDL cholesterol number should be.  HDL cholesterol. An HDL level of 60 or higher is best because it helps to protect against heart disease. A number below 26 for men or below 26 for women increases the risk for heart disease.  Triglycerides. A healthy triglyceride number is below 150.  If your lipid profile is abnormal, your health care provider may do other blood tests to get more information about your condition. How is this treated? Treatment depends on the type of dyslipidemia that you have and your other risk factors for heart disease and stroke. Your health care provider will have a target range for your lipid levels based on this information. For many people, treatment starts with lifestyle changes, such as diet and exercise. Your health care provider may recommend that you:  Get regular exercise.  Make changes to your diet.  Quit smoking if you smoke.  If diet changes and exercise do not help you reach your goals, your health care provider may also prescribe medicine to lower lipids. The most commonly prescribed type of medicine lowers your LDL cholesterol (statin drug). If you have a high triglyceride level, your provider may prescribe another type of drug (fibrate) or an omega-3 fish oil supplement, or both. Follow these instructions at home:  Take over-the-counter and prescription medicines only as told by your health care provider. This includes supplements.  Get regular exercise. Start an aerobic exercise and strength training program as told by your health care provider. Ask your health care provider what activities are safe for you. Your health care provider may recommend: ? 30 minutes of aerobic activity 4-6 days a week. Brisk walking is an example of aerobic activity. ? Strength training 2 days a week.  Eat a  healthy diet as told by your health care provider. This can help you reach and maintain a healthy weight, lower your LDL cholesterol, and raise your HDL cholesterol. It may help to work with a diet and nutrition specialist (dietitian) to make a plan that is right for you. Your dietitian or health care provider may recommend: ? Limiting your calories, if you are overweight. ? Eating more fruits, vegetables, whole grains, fish, and lean meats. ? Limiting saturated fat, trans fat, and cholesterol.  Follow instructions from your health care provider or dietitian about eating or drinking restrictions.  Limit alcohol intake to no more than one drink per day for nonpregnant women and two drinks per day for men. One drink equals 12 oz of beer, 5 oz of wine, or 1 oz of hard liquor.  Do not use any products that contain nicotine or tobacco, such as cigarettes and e-cigarettes. If you need help quitting, ask your health care provider.  Keep all follow-up visits as told by your health care provider. This is important. Contact a health care provider if:  You are having trouble sticking to your exercise or diet plan.  You are struggling to quit smoking or control your use of alcohol. Summary  Dyslipidemia is an imbalance of waxy, fat-like substances (lipids) in the blood. The body needs lipids in small amounts. Dyslipidemia often involves a high level of cholesterol or triglycerides, which are types of lipids.  Treatment depends on the type of dyslipidemia that you have and your other risk factors for heart disease and stroke.  For many people, treatment starts with lifestyle changes, such as diet and exercise. Your health care provider may also prescribe medicine to lower lipids. This information is not intended to replace advice given to you by your health care provider. Make sure you discuss any questions you have with your health care provider. Document Released: 09/17/2013 Document Revised:  05/09/2016 Document Reviewed: 05/09/2016 Elsevier Interactive Patient Education  2018 [place] floaters are specks of material that float around inside your eye. A jelly-like fluid (vitreous) fills the inside of your eye. The vitreous is normally clear. It allows light to pass through to tissues at the back of the eye (retina). The retina contains the nerves needed for vision. Your vitreous can start to shrink and become stringy as you age. Strands of material may start to float around inside the eye. They come from clumps of cells, blood, or other materials. These objects cast shadows on the retina and show up as floaters. Floaters may be more obvious when you look up at the sky or at a bright, blank background. They do not go away completely. In time, however, they may settle below your line of sight. Floaters can be annoying. They do not usually cause vision problems. Sometimes floaters appear along with flashes. Flashes look like bright, quick streaks of light. They usually occur at the edge of your vision. Flashes result when your vitreous pulls on your retina. They also occur with age. However, they could be a warning sign of a detached retina. This is a serious condition that requires emergency treatment to prevent vision loss. What are the causes? For most people, eye floaters develop when the vitreous begins to shrink as a normal part of aging. More serious causes of floaters include:  A torn retina.  Injury.  Bleeding inside the eye. Diabetes and other conditions can cause broken retinal blood vessels.  A blood clot in the major vein of the retina or its branches (retinal vein occlusion).  Retinal detachment.  Vitreous detachment.  Inflammation inside the eye (uveitis).  Infection inside the eye.  What increases the risk? You may have a higher risk for floaters if:  You are older.  You are nearsighted.  You have diabetes.  You have had cataracts  removed.  What are the signs or symptoms? Symptoms of floaters include seeing small, shadowy shapes move across your vision. They move as your eyes move. They drift out of your vision when you keep your eyes still. These shapes may look like:  Specks.  Dots.  Circles.  Squiggly lines.  Thread.  Symptoms of flashes include seeing:  Bursts of light.  Flashing lights.  Lightning streaks.  What is commonly referred to as "stars."  How is this diagnosed? Your health care provider may diagnose floaters and flashes based on your symptoms. You may need to see an eye care specialist (optometrist or ophthalmologist). The specialist will do an exam to determine whether your floaters are a normal part of aging or a warning sign of a more serious eye problem. The specialist may put drops in your eyes to open your pupils wide (dilate) and then use a special scope (slit lamp) to look inside your eye. How is this treated? No treatment is needed for floaters that occur normally with age. Sometimes floaters become severe enough to affect your vision. In rare cases,  surgery to remove the vitreous and replace it with a saltwater solution (vitrectomy) may be considered. Follow these instructions at home: Keep all follow-up visits as directed by your health care provider. This is important. Contact a health care provider if:  You have a sudden increase in floaters.  You have floaters along with flashes.  You have floaters along with any new eye symptoms. Get help right away if:  You have a sudden increase in floaters or flashes that interferes with your vision.  Your vision suddenly changes. This information is not intended to replace advice given to you by your health care provider. Make sure you discuss any questions you have with your health care provider. Document Released: 09/15/2003 Document Revised: 02/18/2016 Document Reviewed: 05/07/2014 Elsevier Interactive Patient Education  2018  [employer].  Managing Your Hypertension Hypertension is commonly called high blood pressure. This is when the force of your blood pressing against the walls of your arteries is too strong. Arteries are blood vessels that carry blood from your heart throughout your body. Hypertension forces the heart to work harder to pump blood, and may cause the arteries to become narrow or stiff. Having untreated or uncontrolled hypertension can cause heart attack, stroke, kidney disease, and other problems. What are blood pressure readings? A blood pressure reading consists of a higher number over a lower number. Ideally, your blood pressure should be below 120/80. The first ("top") number is called the systolic pressure. It is a measure of the pressure in your arteries as your heart beats. The second ("bottom") number is called the diastolic pressure. It is a measure of the pressure in your arteries as the heart relaxes. What does my blood pressure reading mean? Blood pressure is classified into four stages. Based on your blood pressure reading, your health care provider may use the following stages to determine what type of treatment you need, if any. Systolic pressure and diastolic pressure are measured in a unit called mm Hg. Normal  Systolic pressure: below 322.  Diastolic pressure: below 80. Elevated  Systolic pressure: 025-427.  Diastolic pressure: below 80. Hypertension stage 1  Systolic pressure: 062-376.  Diastolic pressure: 28-31. Hypertension stage 2  Systolic pressure: 517 or above.  Diastolic pressure: 90 or above. What health risks are associated with hypertension? Managing your hypertension is an important responsibility. Uncontrolled hypertension can lead to:  A heart attack.  A stroke.  A weakened blood vessel (aneurysm).  Heart failure.  Kidney damage.  Eye damage.  Metabolic syndrome.  Memory and concentration problems.  What changes can I make to manage my  hypertension? Hypertension can be managed by making lifestyle changes and possibly by taking medicines. Your health care provider will help you make a plan to bring your blood pressure within a normal range. Eating and drinking  Eat a diet that is high in fiber and potassium, and low in salt (sodium), added sugar, and fat. An example eating plan is called the DASH (Dietary Approaches to Stop Hypertension) diet. To eat this way: ? Eat plenty of fresh fruits and vegetables. Try to fill half of your plate at each meal with fruits and vegetables. ? Eat whole grains, such as whole wheat pasta, brown rice, or whole grain bread. Fill about one quarter of your plate with whole grains. ? Eat low-fat diary products. ? Avoid fatty cuts of meat, processed or cured meats, and poultry with skin. Fill about one quarter of your plate with lean proteins such as fish, chicken without  skin, beans, eggs, and tofu. ? Avoid premade and processed foods. These tend to be higher in sodium, added sugar, and fat.  Reduce your daily sodium intake. Most people with hypertension should eat less than 1,500 mg of sodium a day.  Limit alcohol intake to no more than 1 drink a day for nonpregnant women and 2 drinks a day for men. One drink equals 12 oz of beer, 5 oz of wine, or 1 oz of hard liquor. Lifestyle  Work with your health care provider to maintain a healthy body weight, or to lose weight. Ask what an ideal weight is for you.  Get at least 30 minutes of exercise that causes your heart to beat faster (aerobic exercise) most days of the week. Activities may include walking, swimming, or biking.  Include exercise to strengthen your muscles (resistance exercise), such as weight lifting, as part of your weekly exercise routine. Try to do these types of exercises for 30 minutes at least 3 days a week.  Do not use any products that contain nicotine or tobacco, such as cigarettes and e-cigarettes. If you need help quitting, ask  your health care provider.  Control any long-term (chronic) conditions you have, such as high cholesterol or diabetes. Monitoring  Monitor your blood pressure at home as told by your health care provider. Your personal target blood pressure may vary depending on your medical conditions, your age, and other factors.  Have your blood pressure checked regularly, as often as told by your health care provider. Working with your health care provider  Review all the medicines you take with your health care provider because there may be side effects or interactions.  Talk with your health care provider about your diet, exercise habits, and other lifestyle factors that may be contributing to hypertension.  Visit your health care provider regularly. Your health care provider can help you create and adjust your plan for managing hypertension. Will I need medicine to control my blood pressure? Your health care provider may prescribe medicine if lifestyle changes are not enough to get your blood pressure under control, and if:  Your systolic blood pressure is 130 or higher.  Your diastolic blood pressure is 80 or higher.  Take medicines only as told by your health care provider. Follow the directions carefully. Blood pressure medicines must be taken as prescribed. The medicine does not work as well when you skip doses. Skipping doses also puts you at risk for problems. Contact a health care provider if:  You think you are having a reaction to medicines you have taken.  You have repeated (recurrent) headaches.  You feel dizzy.  You have swelling in your ankles.  You have trouble with your vision. Get help right away if:  You develop a severe headache or confusion.  You have unusual weakness or numbness, or you feel faint.  You have severe pain in your chest or abdomen.  You vomit repeatedly.  You have trouble breathing. Summary  Hypertension is when the force of blood pumping through  your arteries is too strong. If this condition is not controlled, it may put you at risk for serious complications.  Your personal target blood pressure may vary depending on your medical conditions, your age, and other factors. For most people, a normal blood pressure is less than 120/80.  Hypertension is managed by lifestyle changes, medicines, or both. Lifestyle changes include weight loss, eating a healthy, low-sodium diet, exercising more, and limiting alcohol. This information is not intended  to replace advice given to you by your health care provider. Make sure you discuss any questions you have with your health care provider. Document Released: 06/06/2012 Document Revised: 08/10/2016 Document Reviewed: 08/10/2016 Elsevier Interactive Patient Education  [employer].

## 2018-02-27 NOTE — Progress Notes (Signed)
Subjective:    Patient ID: Nancy Christensen, female    DOB: 1951-02-14, 67 y.o.   MRN: [id number]  No chief complaint on file.   HPI Patient was seen today for follow-up.  Patient endorses recent sedation for acute cholecystitis.  Pt is s/p lap chole on 02/12/18.  Pt states she kept having abdominal pain and emesis which caused her to go to the ED.  Since the surgery pt states she has been doing well.  HTN:  Pt endorses compliance with losartan 50 mg daily.  States bp at home has been in the 130s-140s/80s.  Pt denies headaches, blurred vision, chest pain, numbness.  Pt does note seeing an increased number of floaters in her vision.  Several years ago patient checked and was told it should resolve.  Pt states she had a recent life screen check.  Pt had cholesterol checked on 01/24/18, which was elevated.  Total cholesterol was 263, HDL 55, LDL 179, triglycerides 123.  Past Medical History:  Diagnosis Date  . Hyperlipidemia   . Hypertension     No Known Allergies  ROS General: Denies fever, chills, night sweats, changes in weight, changes in appetite HEENT: Denies headaches, ear pain, changes in vision, rhinorrhea, sore throat   +seeing floaters in R eye CV: Denies CP, palpitations, SOB, orthopnea Pulm: Denies SOB, cough, wheezing GI: Denies abdominal pain, nausea, vomiting, diarrhea, constipation GU: Denies dysuria, hematuria, frequency, vaginal discharge Msk: Denies muscle cramps, joint pains Neuro: Denies weakness, numbness, tingling Skin: Denies rashes, bruising Psych: Denies depression, anxiety, hallucinations     Objective:    Blood pressure 138/84, pulse 60, temperature 97.9 F (36.6 C), temperature source Oral, weight 212 lb (96.2 kg), SpO2 98 %.   Gen. Pleasant, well-nourished, in no distress, normal affect  HEENT: [place]/AT, face symmetric, no scleral icterus, PERRLA, nares patent without drainage Lungs: no accessory muscle use, CTAB, no wheezes or rales Cardiovascular:  RRR, no m/r/g, no peripheral edema Abdomen: BS present, soft, NT/ND, no hepatosplenomegaly. Neuro:  A&Ox3, CN II-XII intact, normal gait Skin:  Warm, no lesions/ rash.  Well healing surgical incisions.  Skin tag in lower L axillary area.  Hyperpigmentation of dorsum of R hand at 3rd digit MCP joint.   Wt Readings from Last 3 Encounters:  02/27/18 212 lb (96.2 kg)  02/22/18 212 lb 3.2 oz (96.3 kg)  02/12/18 215 lb (97.5 kg)    Lab Results  Component Value Date   WBC 18.5 (H) 02/12/2018   HGB 13.9 02/12/2018   HCT 41.9 02/12/2018   PLT 215 02/12/2018   GLUCOSE 152 (H) 02/12/2018   ALT 125 (H) 02/12/2018   AST 159 (H) 02/12/2018   NA 135 02/12/2018   K 4.1 02/12/2018   CL 103 02/12/2018   CREATININE 1.09 (H) 02/12/2018   BUN 16 02/12/2018   CO2 20 (L) 02/12/2018    Assessment/Plan:  Essential hypertension  -controlled -Continue losartan 50 mg daily -Discussed continuing lifestyle modifications -continue checking BP at home and keep a log to bring with her to clinic -Discussed increasing physical activity  Vitreous floaters of right eye  - Plan: Ambulatory referral to Ophthalmology  S/P cholecystectomy -Keep follow-up with Gen surg -Avoid/limit intake of fatty foods  Mixed hyperlipidemia -Discussed making modifications to help reduce cholesterol -Given handout -We will recheck cholesterol in 6 months  Follow-up in the next few months for blood pressure  Grier Mitts, MD

## 2018-03-01 DIAGNOSIS — M25511 Pain in right shoulder: Secondary | ICD-10-CM | POA: Diagnosis not present

## 2018-03-01 DIAGNOSIS — F458 Other somatoform disorders: Secondary | ICD-10-CM | POA: Diagnosis not present

## 2018-03-01 DIAGNOSIS — M25552 Pain in left hip: Secondary | ICD-10-CM | POA: Diagnosis not present

## 2018-03-01 DIAGNOSIS — R42 Dizziness and giddiness: Secondary | ICD-10-CM | POA: Diagnosis not present

## 2018-03-01 DIAGNOSIS — M25562 Pain in left knee: Secondary | ICD-10-CM | POA: Diagnosis not present

## 2018-03-01 DIAGNOSIS — G8918 Other acute postprocedural pain: Secondary | ICD-10-CM | POA: Diagnosis not present

## 2018-03-01 DIAGNOSIS — R030 Elevated blood-pressure reading, without diagnosis of hypertension: Secondary | ICD-10-CM | POA: Diagnosis not present

## 2018-03-08 DIAGNOSIS — M25511 Pain in right shoulder: Secondary | ICD-10-CM | POA: Diagnosis not present

## 2018-03-08 DIAGNOSIS — G8918 Other acute postprocedural pain: Secondary | ICD-10-CM | POA: Diagnosis not present

## 2018-03-08 DIAGNOSIS — R42 Dizziness and giddiness: Secondary | ICD-10-CM | POA: Diagnosis not present

## 2018-03-08 DIAGNOSIS — R030 Elevated blood-pressure reading, without diagnosis of hypertension: Secondary | ICD-10-CM | POA: Diagnosis not present

## 2018-03-08 DIAGNOSIS — F458 Other somatoform disorders: Secondary | ICD-10-CM | POA: Diagnosis not present

## 2018-03-08 DIAGNOSIS — M25552 Pain in left hip: Secondary | ICD-10-CM | POA: Diagnosis not present

## 2018-03-08 DIAGNOSIS — M25562 Pain in left knee: Secondary | ICD-10-CM | POA: Diagnosis not present

## 2018-03-23 ENCOUNTER — Encounter: Payer: Self-pay | Admitting: Internal Medicine

## 2018-03-23 ENCOUNTER — Ambulatory Visit (INDEPENDENT_AMBULATORY_CARE_PROVIDER_SITE_OTHER): Payer: Medicare Other | Admitting: Internal Medicine

## 2018-03-23 VITALS — BP 106/80 | HR 64 | Ht 64.5 in | Wt 217.0 lb

## 2018-03-23 DIAGNOSIS — R05 Cough: Secondary | ICD-10-CM | POA: Diagnosis not present

## 2018-03-23 DIAGNOSIS — R058 Other specified cough: Secondary | ICD-10-CM

## 2018-03-23 MED ORDER — FAMOTIDINE 20 MG PO TABS: ORAL_TABLET | ORAL | 2 refills | Status: DC

## 2018-03-23 MED ORDER — GABAPENTIN 100 MG PO CAPS: 100.0000 mg | ORAL_CAPSULE | Freq: Three times a day (TID) | ORAL | 2 refills | Status: DC

## 2018-03-23 NOTE — Patient Instructions (Addendum)
Continue protonix 40 mg Take 30-60 min before first meal of the day and add pepcid 20 mg with or after supper until return    Gabapentin 100 mg three times a day with meals until return    For drainage / throat tickle try take CHLORPHENIRAMINE  4 mg - take one every 4 hours as needed - available over the counter- may cause drowsiness so start with just a bedtime dose or two and see how you tolerate it before trying in daytime     Please schedule a follow up office visit in 4 weeks, sooner if needed  with all medications /inhalers/ solutions in hand so we can verify exactly what you are taking. This includes all medications from all doctors and over the counters

## 2018-03-23 NOTE — Progress Notes (Signed)
Subjective:     Patient ID: Nancy Christensen, female   DOB: 1950/12/03,    MRN: [id number]    Brief patient profile:  29 yobf never smoker with pattern of "bronchitis" while living in [place] that impoved off ACEi in 212/2015 with w/u reportedly neg CT chest and retired to [employer] in Nov 2018 and started there in  Dec 2018 with intermittent  chest discomfort L upper with hbp > neg cards eval but incidental SPN's > pulmonary eval around Jan 2019 > rec reheck in 6 months and so referred to pulmonary clinic 01/25/2018 by Dr   Volanda Napoleon    History of Present Illness  01/25/2018 1st [facility] office visit/ Jamesina Gaugh   Chief Complaint  Patient presents with  . Pulmonary Consult    Referred by Dr. Volanda Napoleon for eval of pulmonary nodules. Pt c/o cough "for years". She went to ED while in [place] in Jan 2018 with left chest discomfort and CT Chest was done. She states she feels like she needs to cough up sputum, but it gets caught in her throat. Cough is esp worse at night when she lies down.   onset of cough  Was  1980  while living in  [place]  esp p stirring around assoc with pnds eval by allergy > neg  Worse with voice use/ perfumes/ or cold air/Neg foods/ bending urge to clear the throat during the day and immediately on supine at hs but then settles down and sleeps ok  Cp's = paresthesia like x 5 sec random from 0 - 10 x daily s pattern never noct  And are milder now than when prompted original eval "like water running down the front of my chest"  rec Pantoprazole (protonix) 40 mg   Take  30-60 min before first meal of the day and Pepcid (famotidine)  20 mg one @  bedtime until return to office - this is the best way to tell whether stomach acid is contributing to your problem.   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 Follow-up    Cough has  improved some. She has noticed cough occurs when there is a change in the temperature. Cough has occ been prod with very min clear to yellow sputum.   Dyspnea:  Not limited by breathing from desired activities   Cough: same pattern/ notes at hs but then sleeps ok, doesn't wake her up but then "morning clearing throat x sev min x about tsp  Sleep: fine  rec GERD   Pantoprazole (protonix) 40 mg   Take  30-60 min before first meal of the day and Pepcid (famotidine)  20 mg one @  bedtime until return to office - this is the best way to tell whether stomach acid is contributing to your problem.   Prednisone 10 mg take  4 each am x 2 days,   2 each am x 2 days,  1 each am x 2 days and stop  For drainage / throat tickle try take CHLORPHENIRAMINE  4 mg - take one every 4 hours   For tickle in throat> tessalon pearls 200 mg up to three times as needed (won't make you sleepy)    Please schedule a follow up office visit in 4 weeks, sooner if needed  with all medications /inhalers/ solutions in hand so we can verify exactly what you are taking. This includes all medications from all doctors and over the counters    03/23/2018  f/u ov/Rayen Palen re: cough x 1980/ did not start 1st gen H1 blockers per guidelines   Chief Complaint  Patient presents with  . Follow-up    Cough is about the same. She is now producing some minimal white sputum.    Dyspnea:  Not limited by breathing from desired activities   Cough: throat clearing during the day  Sleeping: on side 2 pillows no noct symptoms  SABA use: none 02: none    No obvious day to day or daytime variability or assoc excess/ purulent sputum or mucus plugs or hemoptysis or cp or chest tightness, subjective wheeze or overt sinus or hb symptoms.     Also denies any obvious fluctuation of symptoms with weather or environmental changes or other aggravating or alleviating factors except as outlined above   No unusual exposure hx or h/o childhood pna/ asthma or  knowledge of premature birth.  Current Allergies, Complete Past Medical History, Past Surgical History, Family History, and Social History were reviewed in [employer] record.  ROS  The following are not active complaints unless bolded Hoarseness, sore throat, dysphagia, dental problems, itching, sneezing,  nasal congestion or discharge of excess mucus or purulent secretions, ear ache,   fever, chills, sweats, unintended wt loss or wt gain, classically pleuritic or exertional cp,  orthopnea pnd or arm/hand swelling  or leg swelling, presyncope, palpitations, abdominal pain, anorexia, nausea, vomiting, diarrhea  or change in bowel habits or change in bladder habits, change in stools or change in urine, dysuria, hematuria,  rash, arthralgias, visual complaints, headache, numbness, weakness or ataxia or problems with walking or coordination,  change in mood or  memory.        Current Meds  Medication Sig  . benzonatate (TESSALON) 200 MG capsule Take 1 capsule (200 mg total) by mouth 3 (three) times daily as needed for cough.  . losartan (COZAAR) 50 MG tablet Take 1 tablet (50 mg total) by mouth daily.  Marland Kitchen UNABLE TO FIND Med Name: Ox- Bile 500 mg 1 tablet after each meal             Objective:   Physical Exam  amb bf nad   03/23/2018       217 02/22/2018       212   01/25/18 215 lb (97.5 kg)  01/03/18 215 lb 8 oz (97.8 kg)     Vital signs reviewed - Note on arrival 02 sats  97% on RA   HEENT: nl dentition, turbinates bilaterally, and oropharynx. Nl external ear canals without cough reflex   NECK :  without JVD/Nodes/TM/ nl carotid upstrokes bilaterally   LUNGS: no acc muscle use,  Nl contour chest which is clear to A and P bilaterally without cough on insp or exp maneuvers   CV:  RRR  no s3 or murmur or increase in P2, and no edema   ABD:  soft and nontender with nl inspiratory excursion in the supine position. No bruits or organomegaly appreciated, bowel  sounds nl  MS:  Nl gait/ ext warm without deformities, calf tenderness, cyanosis or clubbing No obvious joint restrictions   SKIN: warm and dry without lesions    NEURO:  alert, approp, nl sensorium with  no motor or cerebellar deficits apparent.  Assessment:

## 2018-03-25 ENCOUNTER — Encounter: Payer: Self-pay | Admitting: Internal Medicine

## 2018-03-25 NOTE — Assessment & Plan Note (Addendum)
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   These are the same pts (now being labeled as having "irritable larynx syndrome" by some cough centers) who not infrequently have a history of having failed to tolerate ace inhibitors,  dry powder inhalers or biphosphonates or report having atypical/extraesophageal reflux symptoms that don't respond to standard doses of PPI  and are easily confused as having aecopd or asthma flares by even experienced allergists/ pulmonologists (myself included).   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     I had an extended discussion with the patient reviewing all relevant studies completed to date and  lasting 15 to 20 minutes of a 25 minute visit    Each maintenance medication was reviewed in detail including most importantly the difference between maintenance and prns and under what circumstances the prns are to be triggered using an action plan format that is not reflected in the computer generated alphabetically organized AVS.     Please see AVS for specific instructions unique to this visit that I personally wrote and verbalized to the the pt in detail and then reviewed with pt  by my nurse highlighting any  changes in therapy recommended at today's visit to their plan of care.

## 2018-03-27 DIAGNOSIS — M25552 Pain in left hip: Secondary | ICD-10-CM | POA: Diagnosis not present

## 2018-03-27 DIAGNOSIS — R030 Elevated blood-pressure reading, without diagnosis of hypertension: Secondary | ICD-10-CM | POA: Diagnosis not present

## 2018-03-27 DIAGNOSIS — M25511 Pain in right shoulder: Secondary | ICD-10-CM | POA: Diagnosis not present

## 2018-03-27 DIAGNOSIS — M25562 Pain in left knee: Secondary | ICD-10-CM | POA: Diagnosis not present

## 2018-03-27 DIAGNOSIS — R42 Dizziness and giddiness: Secondary | ICD-10-CM | POA: Diagnosis not present

## 2018-03-27 DIAGNOSIS — G8918 Other acute postprocedural pain: Secondary | ICD-10-CM | POA: Diagnosis not present

## 2018-03-27 DIAGNOSIS — F458 Other somatoform disorders: Secondary | ICD-10-CM | POA: Diagnosis not present

## 2018-03-28 DIAGNOSIS — H04123 Dry eye syndrome of bilateral lacrimal glands: Secondary | ICD-10-CM | POA: Diagnosis not present

## 2018-03-28 DIAGNOSIS — H43811 Vitreous degeneration, right eye: Secondary | ICD-10-CM | POA: Diagnosis not present

## 2018-04-10 DIAGNOSIS — F458 Other somatoform disorders: Secondary | ICD-10-CM | POA: Diagnosis not present

## 2018-04-10 DIAGNOSIS — R42 Dizziness and giddiness: Secondary | ICD-10-CM | POA: Diagnosis not present

## 2018-04-10 DIAGNOSIS — M25552 Pain in left hip: Secondary | ICD-10-CM | POA: Diagnosis not present

## 2018-04-10 DIAGNOSIS — G8918 Other acute postprocedural pain: Secondary | ICD-10-CM | POA: Diagnosis not present

## 2018-04-10 DIAGNOSIS — M25511 Pain in right shoulder: Secondary | ICD-10-CM | POA: Diagnosis not present

## 2018-04-10 DIAGNOSIS — M25562 Pain in left knee: Secondary | ICD-10-CM | POA: Diagnosis not present

## 2018-04-10 DIAGNOSIS — R030 Elevated blood-pressure reading, without diagnosis of hypertension: Secondary | ICD-10-CM | POA: Diagnosis not present

## 2018-04-17 ENCOUNTER — Other Ambulatory Visit: Payer: Self-pay | Admitting: Family Medicine

## 2018-04-17 ENCOUNTER — Telehealth: Payer: Self-pay | Admitting: Family Medicine

## 2018-04-17 DIAGNOSIS — Z1211 Encounter for screening for malignant neoplasm of colon: Secondary | ICD-10-CM

## 2018-04-17 NOTE — Telephone Encounter (Signed)
done

## 2018-04-17 NOTE — Telephone Encounter (Signed)
Copied from [id number] [phone]. Topic: Quick Communication - See Telephone Encounter >> Apr 17, 2018  3:49 PM Bea Graff, NT wrote: CRM for notification. See Telephone encounter for: 04/17/18. Pt would like an order placed so that she may have a colonoscopy.

## 2018-04-17 NOTE — Telephone Encounter (Signed)
Sent to PCP to advise 

## 2018-04-18 ENCOUNTER — Other Ambulatory Visit: Payer: Self-pay | Admitting: Family Medicine

## 2018-04-18 DIAGNOSIS — I10 Essential (primary) hypertension: Secondary | ICD-10-CM

## 2018-04-18 DIAGNOSIS — N644 Mastodynia: Secondary | ICD-10-CM | POA: Diagnosis not present

## 2018-04-18 HISTORY — DX: Essential (primary) hypertension: I10

## 2018-04-18 NOTE — Telephone Encounter (Signed)
Spoke with pt notified that a referral to GI for colonoscopy was placed, pt voiced understanding

## 2018-04-24 ENCOUNTER — Encounter: Payer: Self-pay | Admitting: Internal Medicine

## 2018-04-24 ENCOUNTER — Ambulatory Visit (INDEPENDENT_AMBULATORY_CARE_PROVIDER_SITE_OTHER): Payer: Medicare Other | Admitting: Internal Medicine

## 2018-04-24 VITALS — BP 140/88 | HR 68 | Ht 64.5 in | Wt 218.0 lb

## 2018-04-24 DIAGNOSIS — R918 Other nonspecific abnormal finding of lung field: Secondary | ICD-10-CM

## 2018-04-24 DIAGNOSIS — R05 Cough: Secondary | ICD-10-CM

## 2018-04-24 DIAGNOSIS — I10 Essential (primary) hypertension: Secondary | ICD-10-CM | POA: Diagnosis not present

## 2018-04-24 DIAGNOSIS — R058 Other specified cough: Secondary | ICD-10-CM

## 2018-04-24 MED ORDER — GABAPENTIN 100 MG PO CAPS: 100.0000 mg | ORAL_CAPSULE | Freq: Three times a day (TID) | ORAL | 2 refills | Status: DC

## 2018-04-24 MED ORDER — DILTIAZEM HCL ER COATED BEADS 240 MG PO CP24: 240.0000 mg | ORAL_CAPSULE | Freq: Every day | ORAL | 11 refills | Status: DC

## 2018-04-24 NOTE — Progress Notes (Signed)
Subjective:     Patient ID: Nancy Christensen, female   DOB: 05/29/1951    MRN: [id number]    Brief patient profile:  29 yobf never smoker with pattern of "bronchitis" while living in [place] that impoved off ACEi in 2012/2015 with w/u reportedly neg CT chest and retired to [employer] in Nov 2018 and started there in  Dec 2018 with intermittent  chest discomfort L upper with hbp > neg cards eval but incidental SPN's > pulmonary eval around Jan 2019 > rec reheck in 6 months and so referred to pulmonary clinic 01/25/2018 by Dr   Volanda Napoleon       History of Present Illness  01/25/2018 1st [facility] office visit/ Wert   Chief Complaint  Patient presents with  . Pulmonary Consult    Referred by Dr. Volanda Napoleon for eval of pulmonary nodules. Pt c/o cough "for years". She went to ED while in [place] in Jan 2018 with left chest discomfort and CT Chest was done. She states she feels like she needs to cough up sputum, but it gets caught in her throat. Cough is esp worse at night when she lies down.   onset of cough  Was  1980  while living in  [place]  esp p stirring around assoc with pnds eval by allergy > neg  Worse with voice use/ perfumes/ or cold air/Neg foods/ bending urge to clear the throat during the day and immediately on supine at hs but then settles down and sleeps ok  Cp's = paresthesia like x 5 sec random from 0 - 10 x daily s pattern never noct  And are milder now than when prompted original eval "like water running down the front of my chest"  rec Pantoprazole (protonix) 40 mg   Take  30-60 min before first meal of the day and Pepcid (famotidine)  20 mg one @  bedtime until return to office - this is the best way to tell whether stomach acid is contributing to your problem.   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 Follow-up    Cough has  improved some. She has noticed cough occurs when there is a change in the temperature. Cough has occ been prod with very min clear to yellow sputum.   Dyspnea:  Not limited by breathing from desired activities   Cough: same pattern/ notes at hs but then sleeps ok, doesn't wake her up but then "morning clearing throat x sev min x about tsp  Sleep: fine  rec GERD   Pantoprazole (protonix) 40 mg   Take  30-60 min before first meal of the day and Pepcid (famotidine)  20 mg one @  bedtime until return to office - this is the best way to tell whether stomach acid is contributing to your problem.   Prednisone 10 mg take  4 each am x 2 days,   2 each am x 2 days,  1 each am x 2 days and stop  For drainage / throat tickle try take CHLORPHENIRAMINE  4 mg - take one every 4 hours   For tickle in throat> tessalon pearls 200 mg up to three times as needed (won't make you sleepy)    Please schedule a follow up office visit in 4 weeks, sooner  if needed  with all medications /inhalers/ solutions in hand so we can verify exactly what you are taking. This includes all medications from all doctors and over the counters    03/23/2018  f/u ov/Wert re: cough x 1980/ did not start 1st gen H1 blockers per guidelines   Chief Complaint  Patient presents with  . Follow-up    Cough is about the same. She is now producing some minimal white sputum.    Dyspnea:  Not limited by breathing from desired activities   Cough: throat clearing during the day  Sleeping: on side 2 pillows no noct symptoms  SABA use: none 02: none   rec Continue protonix 40 mg Take 30-60 min before first meal of the day and add pepcid 20 mg with or after supper until return  Gabapentin 100 mg three times a day with meals until return  For drainage / throat tickle try take CHLORPHENIRAMINE  4 mg - take one every 4 hours as needed - available over the counter- may cause drowsiness so start with just a bedtime dose or two and see how you tolerate it  before trying in daytime   Please schedule a follow up office visit in 4 weeks, sooner if needed  with all medications /inhalers/ solutions in hand so we can verify exactly what you are taking. This includes all medications from all doctors and over the counters    04/24/2018  f/u ov/Wert re: MPN/   uacs x 1980 worse on acei/ still on arb  Chief Complaint  Patient presents with  . Follow-up    Cough has slightly improved. She has noticed she has trouble swallowing when she lies down.   Dyspnea:  Not limited by breathing from desired activities   Cough: daytime/ no longer noc on h1 hs / tessilon helping daytime   Still feel has lump in throat   No obvious day to day or daytime variability or assoc excess/ purulent sputum or mucus plugs or hemoptysis or cp or chest tightness, subjective wheeze or overt sinus or hb symptoms.   Sleeping: fine flat without nocturnal  or early am exacerbation  of respiratory  c/o's or need for noct saba. Also denies any obvious fluctuation of symptoms with weather or environmental changes or other aggravating or alleviating factors except as outlined above   No unusual exposure hx or h/o childhood pna/ asthma or knowledge of premature birth.  Current Allergies, Complete Past Medical History, Past Surgical History, Family History, and Social History were reviewed in [employer] record.  ROS  The following are not active complaints unless bolded Hoarseness, sore throat, dysphagia, dental problems, itching, sneezing,  nasal congestion or discharge of excess mucus or purulent secretions, ear ache,   fever, chills, sweats, unintended wt loss or wt gain, classically pleuritic or exertional cp,  orthopnea pnd or arm/hand swelling  or leg swelling, presyncope, palpitations, abdominal pain, anorexia, nausea, vomiting, diarrhea  or change in bowel habits or change in bladder habits, change in stools or change in urine, dysuria, hematuria,  rash,  arthralgias, visual complaints, headache, numbness, weakness or ataxia or problems with walking or coordination,  change in mood or  memory.        Current Meds  Medication Sig  . benzonatate (TESSALON) 200 MG capsule Take 1 capsule (200 mg total) by mouth 3 (three) times daily as needed for cough.  . chlorpheniramine (CHLOR-TRIMETON) 4 MG tablet Take 4 mg by mouth at bedtime.  Marland Kitchen  Coenzyme Q10 (CO Q 10) 100 MG CAPS Take 1 capsule by mouth daily.  . famotidine (PEPCID) 20 MG tablet One at bedtime  . gabapentin (NEURONTIN) 100 MG capsule Take 1 capsule (100 mg total) by mouth 3 (three) times daily. One three times daily  . pantoprazole (PROTONIX) 40 MG tablet Take 1 tablet (40 mg total) by mouth daily. Take 30-60 min before first meal of the day  . UNABLE TO FIND Med Name: Ox- Bile 500 mg 1 tablet after each meal  . UNABLE TO FIND Med Name: Course and Quicken supplement OTC 1 tab 2 x daily  . White Petrolatum-Mineral Oil (ARTIFICIAL TEARS) ointment as needed.  .  ] gabapentin (NEURONTIN) 100 MG capsule Take 1 capsule (100 mg total) by mouth 3 (three) times daily. One three times daily  . [  losartan (COZAAR) 50 MG tablet Take 1 tablet (50 mg total) by mouth daily.               Objective:   Physical Exam  amb bf, occ throat clearing   04/24/2018       218  03/23/2018       217 02/22/2018       212   01/25/18 215 lb (97.5 kg)  01/03/18 215 lb 8 oz (97.8 kg)     Vital signs reviewed - Note on arrival 02 sats  99% on RA     HEENT: nl dentition, turbinates bilaterally, and oropharynx. Nl external ear canals without cough reflex   NECK :  without JVD/Nodes/TM/ nl carotid upstrokes bilaterally   LUNGS: no acc muscle use,  Nl contour chest which is clear to A and P bilaterally without cough on insp or exp maneuvers   CV:  RRR  no s3 or murmur or increase in P2, and no edema   ABD:  soft and nontender with nl inspiratory excursion in the supine position. No bruits or organomegaly  appreciated, bowel sounds nl  MS:  Nl gait/ ext warm without deformities, calf tenderness, cyanosis or clubbing No obvious joint restrictions   SKIN: warm and dry without lesions    NEURO:  alert, approp, nl sensorium with  no motor or cerebellar deficits apparent.                       Assessment:

## 2018-04-24 NOTE — Patient Instructions (Signed)
Gabapentin 100  2 x 3 times daily = 6 a day x one week and if not better take 100 x 3  X 3 times a day = 9 per day and call me with what dose works and you can tolerate   Stop losartan and take cardizem 240 mg one daily    Please schedule a follow up office visit in 6 weeks, call sooner if needed

## 2018-04-25 ENCOUNTER — Telehealth: Payer: Self-pay | Admitting: Family Medicine

## 2018-04-25 NOTE — Telephone Encounter (Signed)
Copied from [place] [id number]. Topic: Quick Communication - See Telephone Encounter >> Apr 25, 2018  3:30 PM Burchel, Abbi R wrote: CRM for notification. See Telephone encounter for: 04/25/18.  diltiazem (CARDIZEM CD) 240 MG 24 hr capsule  Pt states she went to pulmonary Dr yesterday, and he has prescribed a different medication for her BP (above), and she would like to get approval from Dr Volanda Napoleon before she changes meds.  Please call pt to advise.   Pt: [phone]

## 2018-04-26 NOTE — Telephone Encounter (Signed)
I called the pt, informed her Dr Volanda Napoleon is out of the office and someone will call her next week and she agreed.  Message forwarded to Dr Volanda Napoleon.

## 2018-04-28 ENCOUNTER — Encounter: Payer: Self-pay | Admitting: Internal Medicine

## 2018-04-28 DIAGNOSIS — I10 Essential (primary) hypertension: Secondary | ICD-10-CM | POA: Insufficient documentation

## 2018-04-28 HISTORY — DX: Essential (primary) hypertension: I10

## 2018-04-28 NOTE — Assessment & Plan Note (Signed)
Onset 1980 FENO 01/25/2018  =   21 - Allergy profile 01/25/2018 >  Eos 0.1 /  IgE  17 RAST neg  - flare 02/12/18 p et  - gabapentin 100 mg tid 03/23/2018 > increased to max of 300 tid 04/24/2018    After almost 40 years of cough she appears to be turning the corner p starting gabapentin which is very effective in pts with irritable larynx form of UACS so needs to try to push the level up to 300 tid if tolerates and if better maintain there and if not consider refer to [facility] voice center  I had an extended discussion with the patient reviewing all relevant studies completed to date and  lasting 15 to 20 minutes of a 25 minute visit    Each maintenance medication was reviewed in detail including most importantly the difference between maintenance and prns and under what circumstances the prns are to be triggered using an action plan format that is not reflected in the computer generated alphabetically organized AVS.    Please see AVS for specific instructions unique to this visit that I personally wrote and verbalized to the the pt in detail and then reviewed with pt  by my nurse highlighting any  changes in therapy recommended at today's visit to their plan of care.

## 2018-04-28 NOTE — Assessment & Plan Note (Signed)
Passive smoke exp/ remote  Chest CT  09/24/17 MPN's  Largest 4 x 6 mm R laterally  > rec f/u 09/14/18 (reminder file)   As pt reports neg CT 2015 (not avail)  - ESR 01/25/2018 = 35   In computer for recall   Discussed in detail all the  indications, usual  risks and alternatives  relative to the benefits with patient who agrees to proceed with conservative f/u as outlined

## 2018-04-28 NOTE — Assessment & Plan Note (Signed)
Passive smoke exp/ remote  Chest CT  09/24/17 MPN's  Largest 4 x 6 mm R laterally  > rec f/u 09/14/18 (reminder file)   As pt reports neg CT 2015 (not avail)  - ESR 01/25/2018 = 35   For reasons that may related to vascular permability and nitric oxide pathways but not elevated  bradykinin levels (as seen with  ACEi use) losartan in the generic form has been reported now from mulitple sources  to cause a similar pattern of non-specific  upper airway symptoms as seen with acei.   This has not been reported with exposure to the other ARB's to date, so it seems reasonable for now to try either generic diovan or avapro if ARB needed or use an alternative class altogether.  See:  Lelon Frohlich Allergy Asthma Immunol  2008: 101: p 495-499    Rec Try cardizem 240 mg daily in place of losartan

## 2018-04-30 DIAGNOSIS — H40003 Preglaucoma, unspecified, bilateral: Secondary | ICD-10-CM | POA: Diagnosis not present

## 2018-04-30 NOTE — Telephone Encounter (Signed)
That's fine

## 2018-04-30 NOTE — Telephone Encounter (Signed)
Called pt left a detailed message with Dr Volanda Napoleon approval for pt to start taking the new BP medication,asked pt to call office  If have any further questions. Nothing further needed.

## 2018-05-23 DIAGNOSIS — M50322 Other cervical disc degeneration at C5-C6 level: Secondary | ICD-10-CM | POA: Diagnosis not present

## 2018-05-23 DIAGNOSIS — R293 Abnormal posture: Secondary | ICD-10-CM | POA: Diagnosis not present

## 2018-05-23 DIAGNOSIS — M542 Cervicalgia: Secondary | ICD-10-CM | POA: Diagnosis not present

## 2018-05-23 DIAGNOSIS — M6240 Contracture of muscle, unspecified site: Secondary | ICD-10-CM | POA: Diagnosis not present

## 2018-05-23 DIAGNOSIS — M9901 Segmental and somatic dysfunction of cervical region: Secondary | ICD-10-CM | POA: Diagnosis not present

## 2018-05-23 DIAGNOSIS — M791 Myalgia, unspecified site: Secondary | ICD-10-CM | POA: Diagnosis not present

## 2018-05-29 DIAGNOSIS — M50322 Other cervical disc degeneration at C5-C6 level: Secondary | ICD-10-CM | POA: Diagnosis not present

## 2018-05-29 DIAGNOSIS — R293 Abnormal posture: Secondary | ICD-10-CM | POA: Diagnosis not present

## 2018-05-29 DIAGNOSIS — M9901 Segmental and somatic dysfunction of cervical region: Secondary | ICD-10-CM | POA: Diagnosis not present

## 2018-05-29 DIAGNOSIS — M542 Cervicalgia: Secondary | ICD-10-CM | POA: Diagnosis not present

## 2018-05-29 DIAGNOSIS — M47812 Spondylosis without myelopathy or radiculopathy, cervical region: Secondary | ICD-10-CM | POA: Diagnosis not present

## 2018-05-29 DIAGNOSIS — M6240 Contracture of muscle, unspecified site: Secondary | ICD-10-CM | POA: Diagnosis not present

## 2018-05-29 DIAGNOSIS — M791 Myalgia, unspecified site: Secondary | ICD-10-CM | POA: Diagnosis not present

## 2018-05-29 DIAGNOSIS — M50222 Other cervical disc displacement at C5-C6 level: Secondary | ICD-10-CM | POA: Diagnosis not present

## 2018-05-30 ENCOUNTER — Encounter: Payer: Self-pay | Admitting: Family Medicine

## 2018-05-30 ENCOUNTER — Ambulatory Visit (INDEPENDENT_AMBULATORY_CARE_PROVIDER_SITE_OTHER): Payer: Medicare Other | Admitting: Family Medicine

## 2018-05-30 VITALS — BP 128/86 | HR 54 | Temp 98.4°F | Wt 214.0 lb

## 2018-05-30 DIAGNOSIS — I10 Essential (primary) hypertension: Secondary | ICD-10-CM | POA: Diagnosis not present

## 2018-05-30 DIAGNOSIS — L918 Other hypertrophic disorders of the skin: Secondary | ICD-10-CM

## 2018-05-30 NOTE — Progress Notes (Signed)
Subjective:    Patient ID: Nancy Christensen, female    DOB: 09/20/1951, 67 y.o.   MRN: [id number]  No chief complaint on file.   HPI Patient was seen today for f/u.  HTN: - taking cardizem 240 mg daily.  Med changed by Pulmonology.  -was given other meds, but is taking them prn.  (pt does not like taking medicine) -bp at home 141/78, 129/72 -pt walking, riding stationary bike, and using elliptical for exercise. -cooking more at home.  eating more fish, fruit, and vegetables.  Eating less chicken and salt.  Has not been to [employer]  Skin lesions: -pt notes skin tags in L arm pit. -non painful or irritated, but would like removed  Pt mentions episode of intense back/ shoulder pain a few wks ago.  She went to a Chiropractor, Dr. Tessa Lerner and was told she had bone spurs at C5-7.  She went to her 2nd treatment last wk and notes improvement in her symptoms.  Of note, pt's pulse was 45 at the chiropractor.  Pt typically in  the mid 51s.  Past Medical History:  Diagnosis Date  . Hyperlipidemia   . Hypertension     No Known Allergies  ROS General: Denies fever, chills, night sweats, changes in weight, changes in appetite HEENT: Denies headaches, ear pain, changes in vision, rhinorrhea, sore throat CV: Denies CP, palpitations, SOB, orthopnea Pulm: Denies SOB, cough, wheezing GI: Denies abdominal pain, nausea, vomiting, diarrhea, constipation GU: Denies dysuria, hematuria, frequency, vaginal discharge Msk: Denies muscle cramps, joint pains  +shoulder pain Neuro: Denies weakness, numbness, tingling Skin: Denies rashes, bruising  +skin tags Psych: Denies depression, anxiety, hallucinations     Objective:    Blood pressure 128/86, pulse (!) 54, temperature 98.4 F (36.9 C), temperature source Oral, weight 214 lb (97.1 kg), SpO2 97 %.   Gen. Pleasant, well-nourished, in no distress, normal affect  HEENT: [place]/AT, face symmetric, no scleral icterus, PERRLA, nares patent without  drainage Lungs: no accessory muscle use, CTAB, no wheezes or rales Cardiovascular: RRR, no m/r/g, no peripheral edema Neuro:  A&Ox3, CN II-XII intact, normal gait Skin:  Warm, no rash  2 skin tags in L axilla.   Wt Readings from Last 3 Encounters:  05/30/18 214 lb (97.1 kg)  04/24/18 218 lb (98.9 kg)  03/23/18 217 lb (98.4 kg)    Lab Results  Component Value Date   WBC 18.5 (H) 02/12/2018   HGB 13.9 02/12/2018   HCT 41.9 02/12/2018   PLT 215 02/12/2018   GLUCOSE 152 (H) 02/12/2018   CHOL 263 (A) 01/24/2018   TRIG 143 01/24/2018   HDL 55 01/24/2018   LDLCALC 179 01/24/2018   ALT 125 (H) 02/12/2018   AST 159 (H) 02/12/2018   NA 135 02/12/2018   K 4.1 02/12/2018   CL 103 02/12/2018   CREATININE 1.09 (H) 02/12/2018   BUN 16 02/12/2018   CO2 20 (L) 02/12/2018    Assessment/Plan:  Essential hypertension -controlled -continue lifestyle modifications -continue checking bp at home -continue Cardizem 240 mg daily  Skin tag -consent obtained.  2 skin tags removed with cryotherapy and scissors.  Pt tolerated procedure well.  Minimal blood loss. -given RTC precautions -given handout on after care.  F/u prn in the next few months. Consider TSH and EKG at next OFV if HR remains lower than pt's normal.  Grier Mitts, MD

## 2018-05-30 NOTE — Patient Instructions (Signed)
Skin Tag, Adult A skin tag (acrochordon) is a soft, extra growth of skin. Most skin tags are flesh-colored and rarely bigger than a pencil eraser. They commonly form near areas where there are folds in the skin, such as the armpit or groin. Skin tags are not dangerous, and they do not spread from person to person (are not contagious). You may have one skin tag or several. Skin tags do not require treatment. However, your health care provider may recommend removal of a skin tag if it:  Gets irritated from clothing.  Bleeds.  Is visible and unsightly.  Your health care provider can remove skin tags with a simple surgical procedure or a procedure that involves freezing the skin tag. Follow these instructions at home:  Watch for any changes in your skin tag. A normal skin tag does not require any other special care at home.  Take over-the-counter and prescription medicines only as told by your health care provider.  Keep all follow-up visits as told by your health care provider. This is important. Contact a health care provider if:  You have a skin tag that: ? Becomes painful. ? Changes color. ? Bleeds. ? Swells.  You develop more skin tags. This information is not intended to replace advice given to you by your health care provider. Make sure you discuss any questions you have with your health care provider. Document Released: 09/27/2015 Document Revised: 05/08/2016 Document Reviewed: 09/27/2015 Elsevier Interactive Patient Education  2018 [employer].  Cryoablation, Care After This sheet gives you information about how to care for yourself after your procedure. Your health care provider may also give you more specific instructions. If you have problems or questions, contact your health care provider. What can I expect after the procedure? After the procedure, it is common to have:  Soreness around the treatment area.  Mild pain and swelling in the treatment area.  Follow these  instructions at home: Treatment area care   Follow instructions from your health care provider about how to take care of your incision. Make sure you: ? Wash your hands with soap and water before you change your bandage (dressing). If soap and water are not available, use hand sanitizer. ? Change your dressing as told by your health care provider. ? Leave stitches (sutures) in place. They may need to stay in place for 2 weeks or longer.  Check your treatment area every day for signs of infection. Check for: ? More redness, swelling, or pain. ? More fluid or blood. ? Warmth. ? Pus or a bad smell.  Keep the treated area clean, dry, and covered with a dressing until it has healed. Clean the area with soap and water or as told by your health care provider.  You may shower if your health care provider approves. If your bandage gets wet, change it right away. Activity  Follow instructions from your health care provider about any activity limitations.  Do not drive for 24 hours if you received a medicine to help you relax (sedative). General instructions  Take over-the-counter and prescription medicines only as told by your health care provider.  Keep all follow-up visits as told by your health care provider. This is important. Contact a health care provider if:  You do not have a bowel movement for 2 days.  You have nausea or vomiting.  You have more redness, swelling, or pain around your treatment area.  You have more fluid or blood coming from your treatment area.  Your  treatment area feels warm to the touch.  You have pus or a bad smell coming from your treatment area.  You have a fever. Get help right away if:  You have severe pain.  You have trouble swallowing or breathing.  You have severe weakness or dizziness.  You have chest pain or shortness of breath. This information is not intended to replace advice given to you by your health care provider. Make sure you  discuss any questions you have with your health care provider. Document Released: 07/03/2013 Document Revised: 04/01/2016 Document Reviewed: 02/10/2016 Elsevier Interactive Patient Education  [employer].

## 2018-06-05 ENCOUNTER — Encounter: Payer: Self-pay | Admitting: Internal Medicine

## 2018-06-05 ENCOUNTER — Ambulatory Visit (INDEPENDENT_AMBULATORY_CARE_PROVIDER_SITE_OTHER): Payer: Medicare Other | Admitting: Internal Medicine

## 2018-06-05 VITALS — BP 118/80 | HR 56 | Ht 64.0 in | Wt 213.0 lb

## 2018-06-05 DIAGNOSIS — M6240 Contracture of muscle, unspecified site: Secondary | ICD-10-CM | POA: Diagnosis not present

## 2018-06-05 DIAGNOSIS — M9901 Segmental and somatic dysfunction of cervical region: Secondary | ICD-10-CM | POA: Diagnosis not present

## 2018-06-05 DIAGNOSIS — R293 Abnormal posture: Secondary | ICD-10-CM | POA: Diagnosis not present

## 2018-06-05 DIAGNOSIS — I10 Essential (primary) hypertension: Secondary | ICD-10-CM | POA: Diagnosis not present

## 2018-06-05 DIAGNOSIS — R05 Cough: Secondary | ICD-10-CM

## 2018-06-05 DIAGNOSIS — R058 Other specified cough: Secondary | ICD-10-CM

## 2018-06-05 DIAGNOSIS — M50322 Other cervical disc degeneration at C5-C6 level: Secondary | ICD-10-CM | POA: Diagnosis not present

## 2018-06-05 MED ORDER — BENZONATATE 200 MG PO CAPS: 200.0000 mg | ORAL_CAPSULE | Freq: Three times a day (TID) | ORAL | 2 refills | Status: DC | PRN

## 2018-06-05 MED ORDER — GABAPENTIN 300 MG PO CAPS: 300.0000 mg | ORAL_CAPSULE | Freq: Four times a day (QID) | ORAL | 2 refills | Status: DC

## 2018-06-05 NOTE — Assessment & Plan Note (Signed)
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   These are the same pts (now being labeled as having "irritable larynx syndrome" by some cough centers) who not infrequently have a history of having failed to tolerate ace inhibitors,  dry powder inhalers or biphosphonates or report having atypical/extraesophageal reflux symptoms that don't respond to standard doses of PPI  and are easily confused as having aecopd or asthma flares by even experienced allergists/ pulmonologists (myself included).    She has improved but still has globus with neg findings on exam so rec push gabapentin to 300 mg qid and f/u ENT next step if not satisfied with results   I had an extended discussion with the patient reviewing all relevant studies completed to date and  lasting 15 to 20 minutes of a 25 minute visit    Each maintenance medication was reviewed in detail including most importantly the difference between maintenance and prns and under what circumstances the prns are to be triggered using an action plan format that is not reflected in the computer generated alphabetically organized AVS.     Please see AVS for specific instructions unique to this visit that I personally wrote and verbalized to the the pt  in detail and then reviewed with pt  by my nurse highlighting any  changes in therapy recommended at today's visit to their plan of care.

## 2018-06-05 NOTE — Assessment & Plan Note (Signed)
Changed arb to CCB 04/24/2018 due to cough x one month trial ? Some better   Adequate control on present rx, reviewed in detail with pt > no change in rx needed    Although even in retrospect it may not be clear the ARB contributed to the pt's symptoms,  Pt may have improved off them and adding them back at this point or in the future would risk confusion in interpretation of non-specific respiratory symptoms to which this patient is prone  ie  Better not to muddy the waters here > continue cardizem 240 mg daily

## 2018-06-05 NOTE — Patient Instructions (Addendum)
Change gabapentin to 300 mg four times a day   If not happy to cough control let me refer you to Dr Bettina Gavia at [facility]/ voice center for the tickle in your throat.    Please schedule a follow up visit in 3 months but call sooner if needed

## 2018-06-05 NOTE — Progress Notes (Addendum)
Subjective:    Patient ID: Nancy Christensen, female   DOB: Jun 11, 1951    MRN: [id number]    Brief patient profile:  37  yobf never smoker with pattern of "bronchitis" while living in [place] that impoved off ACEi in 2012/2015 with w/u reportedly neg CT chest and retired to [employer] in Nov 2018 and started there in  Dec 2018 with intermittent  chest discomfort L upper with hbp > neg cards eval but incidental SPN's > pulmonary eval around Jan 2019 > rec reheck in 6 months and so referred to pulmonary clinic 01/25/2018 by Dr   Volanda Napoleon       History of Present Illness  01/25/2018 1st [facility] office visit/ Nancy Christensen   Chief Complaint  Patient presents with  . Pulmonary Consult    Referred by Dr. Volanda Napoleon for eval of pulmonary nodules. Pt c/o cough "for years". She went to ED while in [place] in Jan 2018 with left chest discomfort and CT Chest was done. She states she feels like she needs to cough up sputum, but it gets caught in her throat. Cough is esp worse at night when she lies down.   onset of cough  Was  1980  while living in  [place]  esp p stirring around assoc with pnds eval by allergy > neg  Worse with voice use/ perfumes/ or cold air/Neg foods/ bending urge to clear the throat during the day and immediately on supine at hs but then settles down and sleeps ok  Cp's = paresthesia like x 5 sec random from 0 - 10 x daily s pattern never noct  And are milder now than when prompted original eval "like water running down the front of my chest"  rec Pantoprazole (protonix) 40 mg   Take  30-60 min before first meal of the day and Pepcid (famotidine)  20 mg one @  bedtime until return to office - this is the best way to tell whether stomach acid is contributing to your problem.   GERD diet       02/22/2018  f/u ov/Nancy Christensen re: uacs sp et 02/12/18 for Lap chole / no longer on gerd rx / using peppermints / did not bring meds  Chief Complaint  Patient presents with  . Follow-up    Cough has  improved some. She has noticed cough occurs when there is a change in the temperature. Cough has occ been prod with very min clear to yellow sputum.   Dyspnea:  Not limited by breathing from desired activities   Cough: same pattern/ notes at hs but then sleeps ok, doesn't wake her up but then "morning clearing throat x sev min x about tsp  Sleep: fine  rec GERD   Pantoprazole (protonix) 40 mg   Take  30-60 min before first meal of the day and Pepcid (famotidine)  20 mg one @  bedtime until return to office - this is the best way to tell whether stomach acid is contributing to your problem.   Prednisone 10 mg take  4 each am x 2 days,   2 each am x 2 days,  1 each am x 2 days and stop  For drainage / throat tickle try take CHLORPHENIRAMINE  4 mg - take one every 4 hours   For tickle in throat> tessalon pearls 200 mg up to three times as needed (won't make you sleepy)    Please schedule a follow up office visit in 4 weeks, sooner  if needed  with all medications /inhalers/ solutions in hand so we can verify exactly what you are taking. This includes all medications from all doctors and over the counters    03/23/2018  f/u ov/Nancy Christensen re: cough x 1980/ did not start 1st gen H1 blockers per guidelines   Chief Complaint  Patient presents with  . Follow-up    Cough is about the same. She is now producing some minimal white sputum.    Dyspnea:  Not limited by breathing from desired activities   Cough: throat clearing during the day  Sleeping: on side 2 pillows no noct symptoms  SABA use: none 02: none   rec Continue protonix 40 mg Take 30-60 min before first meal of the day and add pepcid 20 mg with or after supper until return  Gabapentin 100 mg three times a day with meals until return  For drainage / throat tickle try take CHLORPHENIRAMINE  4 mg - take one every 4 hours as needed - available over the counter- may cause drowsiness so start with just a bedtime dose or two and see how you tolerate it  before trying in daytime   Please schedule a follow up office visit in 4 weeks, sooner if needed  with all medications /inhalers/ solutions in hand so we can verify exactly what you are taking. This includes all medications from all doctors and over the counters    04/24/2018  f/u ov/Nancy Christensen re: MPN/   uacs x 1980 worse on acei/ still on arb  Chief Complaint  Patient presents with  . Follow-up    Cough has slightly improved. She has noticed she has trouble swallowing when she lies down.   Dyspnea:  Not limited by breathing from desired activities   Cough: daytime/ no longer noct  on h1 hs / tessilon helping daytime  Still feel has lump in throat  rec Gabapentin 100  2 x 3 times daily = 6 a day x one week and if not better take 100 x 3  X 3 times a day = 9 per day and call me with what dose works and you can tolerate Stop losartan and take cardizem 240 mg one daily     06/05/2018  f/u ov/Nancy Christensen re:  MPN/  / cough x 1980  Chief Complaint  Patient presents with  . Follow-up    Cough has improved. She occ produces some white to yellow sputum.    Dyspnea:  Not limited by breathing from desired activities   Cough: better, still needs tessalon up to twice daily with persistent globus Sleeping: still ok  SABA use: none    No obvious day to day or daytime variability or assoc truly  excess/ purulent sputum or mucus plugs or hemoptysis or cp or chest tightness, subjective wheeze or overt sinus or hb symptoms.   Sleeping as above without nocturnal  or early am exacerbation  of respiratory  c/o's or need for noct saba. Also denies any obvious fluctuation of symptoms with weather or environmental changes or other aggravating or alleviating factors except as outlined above   No unusual exposure hx or h/o childhood pna/ asthma or knowledge of premature birth.  Current Allergies, Complete Past Medical History, Past Surgical History, Family History, and Social History were reviewed in [facility] record.  ROS  The following are not active complaints unless bolded Hoarseness, sore throat, dysphagia, dental problems, itching, sneezing,  nasal congestion or discharge of excess mucus or purulent  secretions, ear ache,   fever, chills, sweats, unintended wt loss or wt gain, classically pleuritic or exertional cp,  orthopnea pnd or arm/hand swelling  or leg swelling, presyncope, palpitations, abdominal pain, anorexia, nausea, vomiting, diarrhea  or change in bowel habits or change in bladder habits, change in stools or change in urine, dysuria, hematuria,  rash, arthralgias, visual complaints, headache, numbness, weakness or ataxia or problems with walking or coordination,  change in mood or  memory.        Current Meds  Medication Sig  . benzonatate (TESSALON) 200 MG capsule Take 1 capsule (200 mg total) by mouth 3 (three) times daily as needed for cough.  . chlorpheniramine (CHLOR-TRIMETON) 4 MG tablet Take 4 mg by mouth at bedtime.  . Coenzyme Q10 (CO Q 10) 100 MG CAPS Take 1 capsule by mouth daily.  Marland Kitchen diltiazem (CARDIZEM CD) 240 MG 24 hr capsule Take 1 capsule (240 mg total) by mouth daily.  . famotidine (PEPCID) 20 MG tablet One at bedtime  . pantoprazole (PROTONIX) 40 MG tablet Take 1 tablet (40 mg total) by mouth daily. Take 30-60 min before first meal of the day  . UNABLE TO FIND Med Name: Ox- Bile 500 mg 1 tablet after each meal  . UNABLE TO FIND Med Name: Course and Quicken supplement OTC 1 tab 2 x daily  . White Petrolatum-Mineral Oil (ARTIFICIAL TEARS) ointment as needed.  .     . [  gabapentin (NEURONTIN) 100 MG capsule Take 1 capsule (100 mg total) by mouth 3 (three) times daily. One three times daily                   Objective:   Physical Exam  amb bf nad   06/05/2018       213  04/24/2018       218  03/23/2018       217 02/22/2018       212   01/25/18 215 lb (97.5 kg)  01/03/18 215 lb 8 oz (97.8 kg)     Vital signs reviewed - Note on arrival  02 sats  97% on RA  And bp 118/80    HEENT: nl dentition, turbinates bilaterally, and oropharynx which is pristine. Nl external ear canals without cough reflex   NECK :  without JVD/Nodes/TM/ nl carotid upstrokes bilaterally   LUNGS: no acc muscle use,  Nl contour chest which is clear to A and P bilaterally without cough on insp or exp maneuvers   CV:  RRR  no s3 or murmur or increase in P2, and no edema   ABD:  soft and nontender with nl inspiratory excursion in the supine position. No bruits or organomegaly appreciated, bowel sounds nl  MS:  Nl gait/ ext warm without deformities, calf tenderness, cyanosis or clubbing No obvious joint restrictions   SKIN: warm and dry without lesions    NEURO:  alert, approp, nl sensorium with  no motor or cerebellar deficits apparent.                           Assessment:

## 2018-06-07 ENCOUNTER — Other Ambulatory Visit: Payer: Self-pay | Admitting: Internal Medicine

## 2018-06-08 ENCOUNTER — Telehealth: Payer: Self-pay | Admitting: Family Medicine

## 2018-06-08 NOTE — Telephone Encounter (Signed)
Copied from [place] [id number]. Topic: Referral - Request >> Jun 08, 2018  4:08 PM Reyne Dumas L wrote: Reason for CRM:   Pt would like for Dr. Volanda Napoleon to order x-ray for right shoulder and arm.  Pt states that it hurts when she tries to raise it.  Pt states she has mentioned this to the doctor before. Pt can be reached at [phone]

## 2018-06-11 NOTE — Telephone Encounter (Signed)
Spoke with pt scheduled an OV on 06/13/2018 at 1.30 pm to see dr Volanda Napoleon for further evaluation

## 2018-06-12 DIAGNOSIS — R293 Abnormal posture: Secondary | ICD-10-CM | POA: Diagnosis not present

## 2018-06-12 DIAGNOSIS — M9901 Segmental and somatic dysfunction of cervical region: Secondary | ICD-10-CM | POA: Diagnosis not present

## 2018-06-12 DIAGNOSIS — M6240 Contracture of muscle, unspecified site: Secondary | ICD-10-CM | POA: Diagnosis not present

## 2018-06-12 DIAGNOSIS — M50322 Other cervical disc degeneration at C5-C6 level: Secondary | ICD-10-CM | POA: Diagnosis not present

## 2018-06-13 ENCOUNTER — Ambulatory Visit (INDEPENDENT_AMBULATORY_CARE_PROVIDER_SITE_OTHER): Payer: Medicare Other | Admitting: Family Medicine

## 2018-06-13 ENCOUNTER — Encounter: Payer: Self-pay | Admitting: Family Medicine

## 2018-06-13 ENCOUNTER — Ambulatory Visit (INDEPENDENT_AMBULATORY_CARE_PROVIDER_SITE_OTHER): Payer: Medicare Other

## 2018-06-13 VITALS — BP 120/70 | HR 56 | Temp 98.1°F | Wt 215.9 lb

## 2018-06-13 DIAGNOSIS — M7521 Bicipital tendinitis, right shoulder: Secondary | ICD-10-CM

## 2018-06-13 DIAGNOSIS — M19011 Primary osteoarthritis, right shoulder: Secondary | ICD-10-CM

## 2018-06-13 DIAGNOSIS — G8929 Other chronic pain: Secondary | ICD-10-CM

## 2018-06-13 DIAGNOSIS — M25562 Pain in left knee: Secondary | ICD-10-CM | POA: Diagnosis not present

## 2018-06-13 DIAGNOSIS — M1712 Unilateral primary osteoarthritis, left knee: Secondary | ICD-10-CM | POA: Diagnosis not present

## 2018-06-13 MED ORDER — PREDNISONE 10 MG PO TABS: ORAL_TABLET | ORAL | 0 refills | Status: DC

## 2018-06-13 NOTE — Progress Notes (Signed)
Subjective:    Patient ID: Nancy Christensen, female    DOB: February 25, 1951, 67 y.o.   MRN: [id number]  Chief Complaint  Patient presents with  . Shoulder Pain    on and off x 3 weeks, pt states pain starts in the shoulder and radiates down the arm, painful to move the arm in any direction, pt states she has to use her other arm to raise the right arm, 9/10 on pain scale  . Hip Pain    left hip, pain strats in the joint and radiats down the leg, constaint pain, worse when mobile, 5/10 on pain scale    HPI Patient was seen today for ongoing concerns.  Pt endorses R shoulder pain on and off x 1 yr.  Pt notes pain in joint with reaching overhead.  Pain a 5/10 but can increase to a 9/10.  Tylenol helps with the pain.  Pt denies any injury, heavy lifting, pushing, pulling, hearing any pops or tears.    Pt also notes L knee pain.  Pt notes previous h/o L knee pain and having fluid removed 3 yrs ago.  Pain happens with walking a lot, feels like knee "catches"/is tight.  Pt also notes L hip pain.  Has been adjusting her walk 2/2 the L knee pain.  Pt denies edema, erythema.  Past Medical History:  Diagnosis Date  . Hyperlipidemia   . Hypertension     No Known Allergies  ROS General: Denies fever, chills, night sweats, changes in weight, changes in appetite HEENT: Denies headaches, ear pain, changes in vision, rhinorrhea, sore throat CV: Denies CP, palpitations, SOB, orthopnea Pulm: Denies SOB, cough, wheezing GI: Denies abdominal pain, nausea, vomiting, diarrhea, constipation GU: Denies dysuria, hematuria, frequency, vaginal discharge Msk: Denies muscle cramps, joint pains  R shoulder pain, L knee pain, L hip pain Neuro: Denies weakness, numbness, tingling Skin: Denies rashes, bruising Psych: Denies depression, anxiety, hallucinations     Objective:    Blood pressure 120/70, pulse (!) 56, temperature 98.1 F (36.7 C), temperature source Oral, weight 215 lb 14.4 oz (97.9 kg), SpO2 98  %.   Gen. Pleasant, well-nourished, in no distress, normal affect   Lungs: no accessory muscle use, CTAB, no wheezes or rales Cardiovascular: RRR, no m/r/g, no peripheral edema Musculoskeletal: Decreased active ROM, R shoulder limited to 80 degrees forward flexion.  TTP of long head of biceps tendon and R AC joint.  R shoulder pain with moving R arm across chest. No deformities, no cyanosis or clubbing, normal tone.  B/l knees symmetric.  No crepitus, effusion, or erythema of b/l knees.  TTP of medial and lateral joint line of L knee.  No calf tenderness b/l. Neuro:  A&Ox3, CN II-XII intact, normal gait   Wt Readings from Last 3 Encounters:  06/13/18 215 lb 14.4 oz (97.9 kg)  06/05/18 213 lb (96.6 kg)  05/30/18 214 lb (97.1 kg)    Lab Results  Component Value Date   WBC 18.5 (H) 02/12/2018   HGB 13.9 02/12/2018   HCT 41.9 02/12/2018   PLT 215 02/12/2018   GLUCOSE 152 (H) 02/12/2018   CHOL 263 (A) 01/24/2018   TRIG 143 01/24/2018   HDL 55 01/24/2018   LDLCALC 179 01/24/2018   ALT 125 (H) 02/12/2018   AST 159 (H) 02/12/2018   NA 135 02/12/2018   K 4.1 02/12/2018   CL 103 02/12/2018   CREATININE 1.09 (H) 02/12/2018   BUN 16 02/12/2018   CO2 20 (L) 02/12/2018  Assessment/Plan:  Chronic pain of left knee  - Plan: DG Knee Complete 4 Views Left, predniSONE (DELTASONE) 10 MG tablet -L knee xray with 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  Will call pt with results.  Will send in rx for voltaren gel. -will offer referral to ortho  Arthritis of right acromioclavicular joint  - Plan: predniSONE (DELTASONE) 10 MG tablet  Biceps tendinitis of right upper extremity  - Plan: predniSONE (DELTASONE) 10 MG tablet  F/u prn  Grier Mitts, MD

## 2018-06-13 NOTE — Patient Instructions (Signed)
Biceps Tendon Tendinitis (Proximal) and Tenosynovitis The proximal biceps tendon is a strong cord of tissue that connects the biceps muscle, on the front of the upper arm, to the shoulder blade. Tendinitis is inflammation of a tendon. Tenosynovitis is inflammation of the lining around the tendon (tendon sheath). These conditions often occur at the same time, and they can interfere with the ability to bend the elbow and turn the hand palm-up (supination). Proximal biceps tendon tendinitis and tenosynovitis are usually caused by overusing the shoulder joint and the biceps muscle. These conditions usually heal within 6 weeks. Proximal biceps tendon tendinitis may include a grade 1 or grade 2 strain of the tendon. A grade 1 strain is mild, and it involves a slight pull of the tendon without any stretching or noticeable tearing of the tendon. There is usually no loss of biceps muscle strength. A grade 2 strain is moderate, and it involves a small tear in the tendon. The tendon is stretched, and biceps strength is usually decreased. What are the causes? This condition may be caused by:  A sudden increase in frequency or intensity of activity that involves the shoulder and the biceps muscle.  Overuse of the biceps muscle. This can happen when you do the same movements over and over, such as: ? Supination. ? Forceful straightening (hyperextension) of the elbow. ? Bending the elbow.  A direct, forceful hit or injury (trauma) to the elbow. This is rare.  What increases the risk? The following factors may make you more likely to develop this condition:  Playing contact sports.  Playing sports that involve throwing and overhead movements, including racket sports, gymnastics, weight lifting, or bodybuilding.  Doing physical labor.  Having poor strength and flexibility of the arm and shoulder.  What are the signs or symptoms? Symptoms of this condition may include:  Pain and inflammation in the front  of the shoulder. Pain may get worse with movement, especially when you use resistance, as in weight lifting.  A feeling of warmth in the front of the shoulder.  Limited range of motion of the shoulder and the elbow.  A crackling sound (crepitation) when you move or touch the shoulder or the upper arm.  In some cases, symptoms may return (recur) after treatment, and they may be long-lasting (chronic). How is this diagnosed? This condition is diagnosed based on your symptoms, your medical history, and a physical exam. You may have tests, including X-rays or MRIs. Your health care provider may test your range of motion by asking you to do arm movements. How is this treated? This condition is treated by resting and icing the injured area, and by doing physical therapy exercises. Depending on the severity of your condition, treatment may also include:  Medicines to help relieve pain and inflammation.  Ultrasound therapy. This is the application of sound waves to the injured area.  Injecting medicines (corticosteroids) into your tendon sheath.  Injecting medicines that numb the area (local anesthetics).  Surgery to remove the damaged part of the tendon and reattach the undamaged part of the tendon to the arm bone (humerus). This is usually only done if you have symptoms that do not get better with other treatment methods.  Follow these instructions at home: Managing pain, stiffness, and swelling  If directed, put ice on the injured area: ? Put ice in a plastic bag. ? Place a towel between your skin and the bag. ? Leave the ice on for 20 minutes, 2-3 times a day.    Move your fingers often to avoid stiffness and to lessen swelling.  Raise (elevate) the injured area above the level of your heart while you are sitting or lying down.  If directed, apply heat to the affected area before you exercise. Use the heat source that your health care provider recommends, such as a moist heat pack or a  heating pad. ? Place a towel between your skin and the heat source. ? Leave the heat on for 20-30 minutes. ? Remove the heat if your skin turns bright red. This is especially important if you are unable to feel pain, heat, or cold. You may have a greater risk of getting burned. Activity  Return to your normal activities as told by your health care provider. Ask your health care provider what activities are safe for you.  Do not lift anything that is heavier than 10 lb (4.5 kg) until your health care provider tells you that it is safe.  Avoid activities that cause pain or make your condition worse.  Do exercises as told by your health care provider. General instructions  Take over-the-counter and prescription medicines only as told by your health care provider.  Do not drive or operate heavy machinery while taking prescription pain medicines.  Keep all follow-up visits as told by your health care provider. This is important. How is this prevented?  Warm up and stretch before being active.  Cool down and stretch after being active.  Give your body time to rest between periods of activity.  Make sure any equipment that you use is fitted to you.  Be safe and responsible while being active to avoid falls.  Do at least 150 minutes of moderate-intensity aerobic exercise each week, such as brisk walking or water aerobics.  Maintain physical fitness, including: ? Strength. ? Flexibility. ? Cardiovascular fitness. ? Endurance. Contact a health care provider if:  You have symptoms that get worse or do not get better after 2 weeks of treatment.  You develop new symptoms. Get help right away if:  You develop severe pain. This information is not intended to replace advice given to you by your health care provider. Make sure you discuss any questions you have with your health care provider. Document Released: 09/12/2005 Document Revised: 05/19/2016 Document Reviewed:  08/21/2015 Elsevier Interactive Patient Education  2018 [employer].  Shoulder Range of Motion Exercises Shoulder range of motion (ROM) exercises are designed to keep the shoulder moving freely. They are often recommended for people who have shoulder pain. Phase 1 exercises When you are able, do this exercise 5-6 days per week, or as told by your health care provider. Work toward doing 2 sets of 10 swings. Pendulum Exercise How To Do This Exercise Lying Down 1. Lie face-down on a bed with your abdomen close to the side of the bed. 2. Let your arm hang over the side of the bed. 3. Relax your shoulder, arm, and hand. 4. Slowly and gently swing your arm forward and back. Do not use your neck muscles to swing your arm. They should be relaxed. If you are struggling to swing your arm, have someone gently swing it for you. When you do this exercise for the first time, swing your arm at a 15 degree angle for 15 seconds, or swing your arm 10 times. As pain lessens over time, increase the angle of the swing to 30-45 degrees. 5. Repeat steps 1-4 with the other arm.  How To Do This Exercise While Standing 1.  Stand next to a sturdy chair or table and hold on to it with your hand. 1. Bend forward at the waist. 2. Bend your knees slightly. 3. Relax your other arm and let it hang limp. 4. Relax the shoulder blade of the arm that is hanging and let it drop. 5. While keeping your shoulder relaxed, use body motion to swing your arm in small circles. The first time you do this exercise, swing your arm for about 30 seconds or 10 times. When you do it next time, swing your arm for a little longer. 6. Stand up tall and relax. 7. Repeat steps 1-7, this time changing the direction of the circles. 2. Repeat steps 1-8 with the other arm.  Phase 2 exercises Do these exercises 3-4 times per day on 5-6 days per week or as told by your health care provider. Work toward holding the stretch for 20 seconds. Stretching  Exercise 1 1. Lift your arm straight out in front of you. 2. Bend your arm 90 degrees at the elbow (right angle) so your forearm goes across your body and looks like the letter "L." 3. Use your other arm to gently pull the elbow forward and across your body. 4. Repeat steps 1-3 with the other arm. Stretching Exercise 2 You will need a towel or rope for this exercise. 1. Bend one arm behind your back with the palm facing outward. 2. Hold a towel with your other hand. 3. Reach the arm that holds the towel above your head, and bend that arm at the elbow. Your wrist should be behind your neck. 4. Use your free hand to grab the free end of the towel. 5. With the higher hand, gently pull the towel up behind you. 6. With the lower hand, pull the towel down behind you. 7. Repeat steps 1-6 with the other arm.  Phase 3 exercises Do each of these exercises at four different times of day (sessions) every day or as told by your health care provider. To begin with, repeat each exercise 5 times (repetitions). Work toward doing 3 sets of 12 repetitions or as told by your health care provider. Strengthening Exercise 1 You will need a light weight for this activity. As you grow stronger, you may use a heavier weight. 1. Standing with a weight in your hand, lift your arm straight out to the side until it is at the same height as your shoulder. 2. Bend your arm at 90 degrees so that your fingers are pointing to the ceiling. 3. Slowly raise your hand until your arm is straight up in the air. 4. Repeat steps 1-3 with the other arm.  Strengthening Exercise 2 You will need a light weight for this activity. As you grow stronger, you may use a heavier weight. 1. Standing with a weight in your hand, gradually move your straight arm in an arc, starting at your side, then out in front of you, then straight up over your head. 2. Gradually move your other arm in an arc, starting at your side, then out in front of you,  then straight up over your head. 3. Repeat steps 1-2 with the other arm.  Strengthening Exercise 3 You will need an elastic band for this activity. As you grow stronger, gradually increase the size of the bands or increase the number of bands that you use at one time. 1. While standing, hold an elastic band in one hand and raise that arm up in the air. 2.  With your other hand, pull down the band until that hand is by your side. 3. Repeat steps 1-2 with the other arm.  This information is not intended to replace advice given to you by your health care provider. Make sure you discuss any questions you have with your health care provider. Document Released: 06/11/2003 Document Revised: 05/08/2016 Document Reviewed: 09/08/2014 Elsevier Interactive Patient Education  2018 [employer].  Knee Pain, Adult Knee pain in adults is common. It can be caused by many things, including:  Arthritis.  A fluid-filled sac (cyst) or growth in your knee.  An infection in your knee.  An injury that will not heal.  Damage, swelling, or irritation of the tissues that support your knee.  Knee pain is usually not a sign of a serious problem. The pain may go away on its own with time and rest. If it does not, a health care provider may order tests to find the cause of the pain. These may include:  Imaging tests, such as an X-ray, MRI, or ultrasound.  Joint aspiration. In this test, fluid is removed from the knee.  Arthroscopy. In this test, a lighted tube is inserted into knee and an image is projected onto a TV screen.  A biopsy. In this test, a sample of tissue is removed from the body and studied under a microscope.  Follow these instructions at home: Pay attention to any changes in your symptoms. Take these actions to relieve your pain. Activity  Rest your knee.  Do not do things that cause pain or make pain worse.  Avoid high-impact activities or exercises, such as running, jumping rope, or  doing jumping jacks. General instructions  Take over-the-counter and prescription medicines only as told by your health care provider.  Raise (elevate) your knee above the level of your heart when you are sitting or lying down.  Sleep with a pillow under your knee.  If directed, apply ice to the knee: ? Put ice in a plastic bag. ? Place a towel between your skin and the bag. ? Leave the ice on for 20 minutes, 2-3 times a day.  Ask your health care provider if you should wear an elastic knee support.  Lose weight if you are overweight. Extra weight can put pressure on your knee.  Do not use any products that contain nicotine or tobacco, such as cigarettes and e-cigarettes. Smoking may slow the healing of any bone and joint problems that you may have. If you need help quitting, ask your health care provider. Contact a health care provider if:  Your knee pain continues, changes, or gets worse.  You have a fever along with knee pain.  Your knee buckles or locks up.  Your knee swells, and the swelling becomes worse. Get help right away if:  Your knee feels warm to the touch.  You cannot move your knee.  You have severe pain in your knee.  You have chest pain.  You have trouble breathing. Summary  Knee pain in adults is common. It can be caused by many things, including, arthritis, infection, cysts, or injury.  Knee pain is usually not a sign of a serious problem, but if it does not go away, a health care provider may perform tests to know the cause of the pain.  Pay attention to any changes in your symptoms. Relieve your pain with rest, medicines, light activity, and use of ice.  Get help if your pain continues or becomes very severe, or  if your knee buckles or locks up, or if you have chest pain or trouble breathing. This information is not intended to replace advice given to you by your health care provider. Make sure you discuss any questions you have with your health  care provider. Document Released: 07/10/2007 Document Revised: 09/02/2016 Document Reviewed: 09/02/2016 Elsevier Interactive Patient Education  [employer].

## 2018-06-14 ENCOUNTER — Encounter: Payer: Self-pay | Admitting: Family Medicine

## 2018-06-14 DIAGNOSIS — M1712 Unilateral primary osteoarthritis, left knee: Secondary | ICD-10-CM | POA: Insufficient documentation

## 2018-06-14 HISTORY — DX: Unilateral primary osteoarthritis, left knee: M17.12

## 2018-06-14 MED ORDER — DICLOFENAC SODIUM 1 % TD GEL: 2.0000 g | Freq: Four times a day (QID) | TRANSDERMAL | 2 refills | Status: DC | PRN

## 2018-06-18 ENCOUNTER — Other Ambulatory Visit: Payer: Self-pay | Admitting: Internal Medicine

## 2018-06-18 MED ORDER — GABAPENTIN 300 MG PO CAPS: 300.0000 mg | ORAL_CAPSULE | Freq: Four times a day (QID) | ORAL | 1 refills | Status: DC

## 2018-06-19 DIAGNOSIS — M9901 Segmental and somatic dysfunction of cervical region: Secondary | ICD-10-CM | POA: Diagnosis not present

## 2018-06-19 DIAGNOSIS — M6240 Contracture of muscle, unspecified site: Secondary | ICD-10-CM | POA: Diagnosis not present

## 2018-06-19 DIAGNOSIS — M50322 Other cervical disc degeneration at C5-C6 level: Secondary | ICD-10-CM | POA: Diagnosis not present

## 2018-06-19 DIAGNOSIS — R293 Abnormal posture: Secondary | ICD-10-CM | POA: Diagnosis not present

## 2018-06-26 DIAGNOSIS — R293 Abnormal posture: Secondary | ICD-10-CM | POA: Diagnosis not present

## 2018-06-26 DIAGNOSIS — M47812 Spondylosis without myelopathy or radiculopathy, cervical region: Secondary | ICD-10-CM | POA: Diagnosis not present

## 2018-06-26 DIAGNOSIS — M6240 Contracture of muscle, unspecified site: Secondary | ICD-10-CM | POA: Diagnosis not present

## 2018-06-26 DIAGNOSIS — M50322 Other cervical disc degeneration at C5-C6 level: Secondary | ICD-10-CM | POA: Diagnosis not present

## 2018-06-26 DIAGNOSIS — M791 Myalgia, unspecified site: Secondary | ICD-10-CM | POA: Diagnosis not present

## 2018-06-26 DIAGNOSIS — M50222 Other cervical disc displacement at C5-C6 level: Secondary | ICD-10-CM | POA: Diagnosis not present

## 2018-06-26 DIAGNOSIS — M9901 Segmental and somatic dysfunction of cervical region: Secondary | ICD-10-CM | POA: Diagnosis not present

## 2018-06-27 ENCOUNTER — Other Ambulatory Visit: Payer: Self-pay | Admitting: Family Medicine

## 2018-06-27 DIAGNOSIS — M19011 Primary osteoarthritis, right shoulder: Secondary | ICD-10-CM

## 2018-06-27 DIAGNOSIS — G8929 Other chronic pain: Secondary | ICD-10-CM

## 2018-06-27 DIAGNOSIS — M25562 Pain in left knee: Principal | ICD-10-CM

## 2018-06-29 ENCOUNTER — Encounter: Payer: Self-pay | Admitting: Family Medicine

## 2018-06-29 DIAGNOSIS — Z1231 Encounter for screening mammogram for malignant neoplasm of breast: Secondary | ICD-10-CM | POA: Diagnosis not present

## 2018-07-04 ENCOUNTER — Ambulatory Visit (INDEPENDENT_AMBULATORY_CARE_PROVIDER_SITE_OTHER): Payer: Medicare Other | Admitting: Orthopaedic Surgery

## 2018-07-04 ENCOUNTER — Telehealth (INDEPENDENT_AMBULATORY_CARE_PROVIDER_SITE_OTHER): Payer: Self-pay

## 2018-07-04 ENCOUNTER — Encounter (INDEPENDENT_AMBULATORY_CARE_PROVIDER_SITE_OTHER): Payer: Self-pay | Admitting: Orthopaedic Surgery

## 2018-07-04 ENCOUNTER — Ambulatory Visit (INDEPENDENT_AMBULATORY_CARE_PROVIDER_SITE_OTHER): Payer: Medicare Other

## 2018-07-04 DIAGNOSIS — M6240 Contracture of muscle, unspecified site: Secondary | ICD-10-CM | POA: Diagnosis not present

## 2018-07-04 DIAGNOSIS — R293 Abnormal posture: Secondary | ICD-10-CM | POA: Diagnosis not present

## 2018-07-04 DIAGNOSIS — M1712 Unilateral primary osteoarthritis, left knee: Secondary | ICD-10-CM

## 2018-07-04 DIAGNOSIS — M25511 Pain in right shoulder: Secondary | ICD-10-CM

## 2018-07-04 DIAGNOSIS — M791 Myalgia, unspecified site: Secondary | ICD-10-CM | POA: Diagnosis not present

## 2018-07-04 DIAGNOSIS — M50322 Other cervical disc degeneration at C5-C6 level: Secondary | ICD-10-CM | POA: Diagnosis not present

## 2018-07-04 DIAGNOSIS — G8929 Other chronic pain: Secondary | ICD-10-CM

## 2018-07-04 DIAGNOSIS — M9901 Segmental and somatic dysfunction of cervical region: Secondary | ICD-10-CM | POA: Diagnosis not present

## 2018-07-04 DIAGNOSIS — M47812 Spondylosis without myelopathy or radiculopathy, cervical region: Secondary | ICD-10-CM | POA: Diagnosis not present

## 2018-07-04 DIAGNOSIS — M50222 Other cervical disc displacement at C5-C6 level: Secondary | ICD-10-CM | POA: Diagnosis not present

## 2018-07-04 HISTORY — DX: Other chronic pain: G89.29

## 2018-07-04 MED ORDER — METHYLPREDNISOLONE ACETATE 40 MG/ML IJ SUSP
40.0000 mg | INTRAMUSCULAR | Status: AC | PRN
  Administered 2018-07-04: 40 mg via INTRA_ARTICULAR

## 2018-07-04 MED ORDER — BUPIVACAINE HCL 0.25 % IJ SOLN
2.0000 mL | INTRAMUSCULAR | Status: AC | PRN
  Administered 2018-07-04: 2 mL via INTRA_ARTICULAR

## 2018-07-04 MED ORDER — LIDOCAINE HCL 1 % IJ SOLN
2.0000 mL | INTRAMUSCULAR | Status: AC | PRN
  Administered 2018-07-04: 2 mL

## 2018-07-04 NOTE — Telephone Encounter (Signed)
Noted  

## 2018-07-04 NOTE — Telephone Encounter (Signed)
Please submit for Gel inj for Left knee-Dr Erlinda Hong

## 2018-07-04 NOTE — Telephone Encounter (Signed)
Submitted VOB for SynviscOne, left knee. 

## 2018-07-04 NOTE — Progress Notes (Signed)
Office Visit Note   Patient: Nancy Christensen           Date of Birth: 02-Jan-1951           MRN: [id number] Visit Date: 07/04/2018              Requested by: Billie Ruddy, MD [place], [place] [address] PCP: Billie Ruddy, MD   Assessment & Plan: Visit Diagnoses:  1. Chronic right shoulder pain   2. Unilateral primary osteoarthritis, left knee     Plan: Impression is right shoulder rotator cuff tendinitis versus tear.  #2 left knee end-stage degenerative joint disease.  In regards to the right shoulder, we will go ahead and obtain an MRI to assess her rotator cuff due to the longevity of the symptoms.  She will follow-up with [place] once that has been completed.  In regards to the left knee, we will inject this with cortisone today.  We will also send for Visco supplementation approval.  She will follow-up with [place] once she has been approved.  She is aware that she has significant degenerative changes and this will eventually come down to a left total knee replacement.  She will call with concerns or questions in the meantime.  Follow-Up Instructions: Return in about 10 days (around 07/14/2018) for after MRI.   Orders:  Orders Placed This Encounter  Procedures  . Large Joint Inj: L knee  . XR Shoulder Right  . MR SHOULDER RIGHT WO CONTRAST   No orders of the defined types were placed in this encounter.     Procedures: Large Joint Inj: L knee on 07/04/2018 4:20 PM Indications: pain Details: 22 G needle, anterolateral approach Medications: 2 mL bupivacaine 0.25 %; 2 mL lidocaine 1 %; 40 mg methylPREDNISolone acetate 40 MG/ML      Clinical Data: No additional findings.   Subjective: Chief Complaint  Patient presents with  . Left Knee - Pain  . Right Shoulder - Pain    HPI patient is a pleasant 67 year old female who presents to our clinic today with right shoulder and left knee pain.  In regards to her right shoulder, this has been ongoing for  the past 10 years.  It was initially intermittent pain but has become more constant over the past year and a half.  There is never any known injury or change in activity.  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  The pain she is having to the right shoulder is to the anterior aspect radiating into the deltoid.  She notes a heavy feeling on trying to lift her arm.  Pain is significantly worsened with abduction and forward flexion.  She does take Tylenol with moderate relief of symptoms.  She notes occasional tingling to the small finger and most recently to the thumb.  No previous cortisone injection, surgical intervention or MRI to the right shoulder.  In regards to the left knee, this is been ongoing for the past 10 years.  It has progressively worsened.  She does note a twisting injury several years ago while running.  She was seen in the ED where her knee was aspirated and then injected with cortisone.  This significantly helped for a long time.  The primary pain is to the medial aspect.  She does note associated locking and catching.  Pain is worse going from a seated to standing position.  She denies any night  pain.  Her last cortisone injection was back in 2016 with moderate relief of symptoms.  No previous viscosupplementation injection or surgical intervention to the left knee.  Review of Systems as detailed in HPI.  All others reviewed and are negative.   Objective: Vital Signs: There were no vitals taken for this visit.  Physical Exam well-developed well-nourished female in no acute distress.  Alert and oriented x3  Ortho Exam examination of the right shoulder reveals near full forward flexion with marked pain past 160 degrees.  Decreased abduction secondary to pain.  She can internally rotate to L5.  Markedly positive empty can and cross body abduction.  Negative drop arm.  She is neurovascularly intact distally.  Examination of the left knee  reveals no effusion.  Range of motion 0 to 120 degrees.  Moderate medial joint line tenderness.  1+ patellofemoral crepitus.  She is stable valgus varus stress.  She is neurovascularly intact distally.  Specialty Comments:  No specialty comments available.  Imaging: X-rays of the left knee reviewed by me in canopy reveal severe tract departmental degenerative changes Xr Shoulder Right  Result Date: 07/04/2018 X-rays of the right shoulder show decreased glenohumeral joint space.  Moderate AC degenerative changes    PMFS History: Patient Active Problem List   Diagnosis Date Noted  . Chronic right shoulder pain 07/04/2018  . Unilateral primary osteoarthritis, left knee 06/14/2018  . Essential hypertension 04/28/2018  . Cholecystitis with cholelithiasis 02/11/2018  . Multiple pulmonary nodules determined by computed tomography of lung 01/26/2018  . Upper airway cough syndrome 01/25/2018   Past Medical History:  Diagnosis Date  . Hyperlipidemia   . Hypertension     History reviewed. No pertinent family history.  Past Surgical History:  Procedure Laterality Date  . ABDOMINAL HYSTERECTOMY  1997  . BREAST SURGERY  2002   left cyst removal   . CHOLECYSTECTOMY N/A 02/12/2018   Procedure: LAPAROSCOPIC CHOLECYSTECTOMY;  Surgeon: Leighton Ruff, MD;  Location: [facility];  Service: General;  Laterality: N/A;   Social History   Occupational History  . Not on file  Tobacco Use  . Smoking status: Never Smoker  . Smokeless tobacco: Never Used  Substance and Sexual Activity  . Alcohol use: Not Currently  . Drug use: Not Currently  . Sexual activity: Not on file

## 2018-07-10 DIAGNOSIS — M791 Myalgia, unspecified site: Secondary | ICD-10-CM | POA: Diagnosis not present

## 2018-07-10 DIAGNOSIS — M50222 Other cervical disc displacement at C5-C6 level: Secondary | ICD-10-CM | POA: Diagnosis not present

## 2018-07-10 DIAGNOSIS — M6240 Contracture of muscle, unspecified site: Secondary | ICD-10-CM | POA: Diagnosis not present

## 2018-07-10 DIAGNOSIS — M47812 Spondylosis without myelopathy or radiculopathy, cervical region: Secondary | ICD-10-CM | POA: Diagnosis not present

## 2018-07-10 DIAGNOSIS — M542 Cervicalgia: Secondary | ICD-10-CM | POA: Diagnosis not present

## 2018-07-10 DIAGNOSIS — R293 Abnormal posture: Secondary | ICD-10-CM | POA: Diagnosis not present

## 2018-07-10 DIAGNOSIS — M9901 Segmental and somatic dysfunction of cervical region: Secondary | ICD-10-CM | POA: Diagnosis not present

## 2018-07-10 DIAGNOSIS — M50322 Other cervical disc degeneration at C5-C6 level: Secondary | ICD-10-CM | POA: Diagnosis not present

## 2018-07-18 ENCOUNTER — Ambulatory Visit
Admission: RE | Admit: 2018-07-18 | Discharge: 2018-07-18 | Disposition: A | Discharge status: Home / Self Care | Payer: Medicare Other | Source: Ambulatory Visit | Attending: Physician Assistant | Admitting: Physician Assistant

## 2018-07-18 DIAGNOSIS — M47812 Spondylosis without myelopathy or radiculopathy, cervical region: Secondary | ICD-10-CM | POA: Diagnosis not present

## 2018-07-18 DIAGNOSIS — M9901 Segmental and somatic dysfunction of cervical region: Secondary | ICD-10-CM | POA: Diagnosis not present

## 2018-07-18 DIAGNOSIS — G8929 Other chronic pain: Secondary | ICD-10-CM

## 2018-07-18 DIAGNOSIS — M25511 Pain in right shoulder: Secondary | ICD-10-CM | POA: Diagnosis not present

## 2018-07-18 DIAGNOSIS — M50322 Other cervical disc degeneration at C5-C6 level: Secondary | ICD-10-CM | POA: Diagnosis not present

## 2018-07-18 DIAGNOSIS — M791 Myalgia, unspecified site: Secondary | ICD-10-CM | POA: Diagnosis not present

## 2018-07-18 DIAGNOSIS — M50222 Other cervical disc displacement at C5-C6 level: Secondary | ICD-10-CM | POA: Diagnosis not present

## 2018-07-18 DIAGNOSIS — R293 Abnormal posture: Secondary | ICD-10-CM | POA: Diagnosis not present

## 2018-07-18 DIAGNOSIS — M6240 Contracture of muscle, unspecified site: Secondary | ICD-10-CM | POA: Diagnosis not present

## 2018-07-18 NOTE — Progress Notes (Signed)
Fu up to discuss mri.  Please make sure xu in office

## 2018-07-19 ENCOUNTER — Ambulatory Visit (INDEPENDENT_AMBULATORY_CARE_PROVIDER_SITE_OTHER): Payer: Medicare Other | Admitting: Orthopaedic Surgery

## 2018-07-19 DIAGNOSIS — M25511 Pain in right shoulder: Secondary | ICD-10-CM | POA: Diagnosis not present

## 2018-07-19 DIAGNOSIS — G8929 Other chronic pain: Secondary | ICD-10-CM

## 2018-07-19 NOTE — Addendum Note (Signed)
Addended by: Precious Bard on: 07/19/2018 09:00 AM   Modules accepted: Orders

## 2018-07-19 NOTE — Progress Notes (Signed)
Office Visit Note   Patient: Nancy Christensen           Date of Birth: [birth year]           MRN: [id number] Visit Date: 07/19/2018              Requested by: Billie Ruddy, MD [place], [place] [address] PCP: Billie Ruddy, MD   Assessment & Plan: Visit Diagnoses:  1. 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  Treatment options were discussed with the patient and we agreed to move forward with conservative treatment.  We will refer her to physical therapy for strengthening and range of motion.  We will also set her up with a glenohumeral steroid injection.  Questions encouraged and answered.  Follow-up as needed.  Follow-Up Instructions: Return if symptoms worsen or fail to improve.   Orders:  No orders of the defined types were placed in this encounter.  No orders of the defined types were placed in this encounter.     Procedures: No procedures performed   Clinical Data: No additional findings.   Subjective: Chief Complaint  Patient presents with  . Right Shoulder - Pain, Follow-up    MRI review    Nancy Christensen follows up today for MRI review of her right shoulder.  She is doing well with her left knee status post cortisone injection.  She states that her right shoulder has been hurting off and on for many years with recent worsening and now the pain is constant.   Review of Systems  Constitutional: Negative.   HENT: Negative.   Eyes: Negative.   Respiratory: Negative.   Cardiovascular: Negative.   Endocrine: Negative.   Musculoskeletal: Negative.   Neurological: Negative.   Hematological: Negative.   Psychiatric/Behavioral: Negative.   All other systems reviewed and are negative.    Objective: Vital Signs: There were no  vitals taken for this visit.  Physical Exam  Constitutional: She is oriented to person, place, and time. She appears well-developed and well-nourished.  Pulmonary/Chest: Effort normal.  Neurological: She is alert and oriented to person, place, and time.  Skin: Skin is warm. Capillary refill takes less than 2 seconds.  Psychiatric: She has a normal mood and affect. Her behavior is normal. Judgment and thought content normal.  Nursing note and vitals reviewed.   Ortho Exam Right shoulder exam is consistent with rotator cuff arthropathy.  She has limited active forward flexion and active shoulder abduction.  Passive range of motion is near normal. Specialty Comments:  No specialty comments available.  Imaging: No results found.   PMFS History: Patient Active Problem List   Diagnosis Date Noted  . Chronic right shoulder pain 07/04/2018  . Unilateral primary osteoarthritis, left knee 06/14/2018  . Essential hypertension 04/28/2018  . Cholecystitis with cholelithiasis 02/11/2018  . Multiple pulmonary nodules determined by computed tomography of lung 01/26/2018  . Upper airway cough syndrome 01/25/2018   Past Medical History:  Diagnosis Date  . Hyperlipidemia   . Hypertension     No family history on file.  Past Surgical History:  Procedure Laterality Date  . ABDOMINAL HYSTERECTOMY  1997  . BREAST SURGERY  2002   left cyst removal   . CHOLECYSTECTOMY N/A 02/12/2018   Procedure: LAPAROSCOPIC CHOLECYSTECTOMY;  Surgeon: Leighton Ruff,  MD;  Location: [facility];  Service: General;  Laterality: N/A;   Social History   Occupational History  . Not on file  Tobacco Use  . Smoking status: Never Smoker  . Smokeless tobacco: Never Used  Substance and Sexual Activity  . Alcohol use: Not Currently  . Drug use: Not Currently  . Sexual activity: Not on file

## 2018-07-24 DIAGNOSIS — M50222 Other cervical disc displacement at C5-C6 level: Secondary | ICD-10-CM | POA: Diagnosis not present

## 2018-07-24 DIAGNOSIS — R293 Abnormal posture: Secondary | ICD-10-CM | POA: Diagnosis not present

## 2018-07-24 DIAGNOSIS — M791 Myalgia, unspecified site: Secondary | ICD-10-CM | POA: Diagnosis not present

## 2018-07-24 DIAGNOSIS — M47812 Spondylosis without myelopathy or radiculopathy, cervical region: Secondary | ICD-10-CM | POA: Diagnosis not present

## 2018-07-24 DIAGNOSIS — M6240 Contracture of muscle, unspecified site: Secondary | ICD-10-CM | POA: Diagnosis not present

## 2018-07-24 DIAGNOSIS — M9901 Segmental and somatic dysfunction of cervical region: Secondary | ICD-10-CM | POA: Diagnosis not present

## 2018-07-24 DIAGNOSIS — M50322 Other cervical disc degeneration at C5-C6 level: Secondary | ICD-10-CM | POA: Diagnosis not present

## 2018-07-27 ENCOUNTER — Telehealth (INDEPENDENT_AMBULATORY_CARE_PROVIDER_SITE_OTHER): Payer: Self-pay

## 2018-07-27 NOTE — Telephone Encounter (Signed)
Called and left a VM advising patient that she is approved for gel injection.  Patient is approved for SynviscOne, left knee. Buy & Bill Covered at 100% after Medicare pays No Co-pay No PA required

## 2018-07-30 ENCOUNTER — Telehealth (INDEPENDENT_AMBULATORY_CARE_PROVIDER_SITE_OTHER): Payer: Self-pay | Admitting: Orthopaedic Surgery

## 2018-07-30 NOTE — Telephone Encounter (Signed)
Patient is requesting referral for physical therapy. She states she received a paper copy at her last visit. The fax number to the PT office is [phone] ( direct number is [phone] ) Patients # [phone]

## 2018-07-30 NOTE — Telephone Encounter (Signed)
Faxed order

## 2018-08-07 DIAGNOSIS — M9901 Segmental and somatic dysfunction of cervical region: Secondary | ICD-10-CM | POA: Diagnosis not present

## 2018-08-07 DIAGNOSIS — R293 Abnormal posture: Secondary | ICD-10-CM | POA: Diagnosis not present

## 2018-08-07 DIAGNOSIS — M50322 Other cervical disc degeneration at C5-C6 level: Secondary | ICD-10-CM | POA: Diagnosis not present

## 2018-08-07 DIAGNOSIS — M47812 Spondylosis without myelopathy or radiculopathy, cervical region: Secondary | ICD-10-CM | POA: Diagnosis not present

## 2018-08-07 DIAGNOSIS — M791 Myalgia, unspecified site: Secondary | ICD-10-CM | POA: Diagnosis not present

## 2018-08-07 DIAGNOSIS — M6240 Contracture of muscle, unspecified site: Secondary | ICD-10-CM | POA: Diagnosis not present

## 2018-08-07 DIAGNOSIS — M50222 Other cervical disc displacement at C5-C6 level: Secondary | ICD-10-CM | POA: Diagnosis not present

## 2018-08-08 ENCOUNTER — Ambulatory Visit (INDEPENDENT_AMBULATORY_CARE_PROVIDER_SITE_OTHER): Payer: Self-pay | Admitting: Physical Medicine and Rehabilitation

## 2018-08-09 ENCOUNTER — Telehealth (INDEPENDENT_AMBULATORY_CARE_PROVIDER_SITE_OTHER): Payer: Self-pay | Admitting: Orthopaedic Surgery

## 2018-08-09 NOTE — Telephone Encounter (Signed)
Faxed demo/insur to [facility] [phone]

## 2018-08-10 DIAGNOSIS — M50222 Other cervical disc displacement at C5-C6 level: Secondary | ICD-10-CM | POA: Diagnosis not present

## 2018-08-10 DIAGNOSIS — M50322 Other cervical disc degeneration at C5-C6 level: Secondary | ICD-10-CM | POA: Diagnosis not present

## 2018-08-10 DIAGNOSIS — R293 Abnormal posture: Secondary | ICD-10-CM | POA: Diagnosis not present

## 2018-08-10 DIAGNOSIS — M6240 Contracture of muscle, unspecified site: Secondary | ICD-10-CM | POA: Diagnosis not present

## 2018-08-10 DIAGNOSIS — M542 Cervicalgia: Secondary | ICD-10-CM | POA: Diagnosis not present

## 2018-08-10 DIAGNOSIS — M791 Myalgia, unspecified site: Secondary | ICD-10-CM | POA: Diagnosis not present

## 2018-08-10 DIAGNOSIS — M47812 Spondylosis without myelopathy or radiculopathy, cervical region: Secondary | ICD-10-CM | POA: Diagnosis not present

## 2018-08-10 DIAGNOSIS — M9901 Segmental and somatic dysfunction of cervical region: Secondary | ICD-10-CM | POA: Diagnosis not present

## 2018-08-16 ENCOUNTER — Encounter: Payer: Self-pay | Admitting: Family Medicine

## 2018-08-16 ENCOUNTER — Ambulatory Visit (INDEPENDENT_AMBULATORY_CARE_PROVIDER_SITE_OTHER): Payer: Medicare Other | Admitting: Family Medicine

## 2018-08-16 VITALS — BP 122/78 | HR 64 | Temp 98.0°F | Ht 64.0 in | Wt 217.7 lb

## 2018-08-16 DIAGNOSIS — M546 Pain in thoracic spine: Secondary | ICD-10-CM

## 2018-08-16 MED ORDER — TIZANIDINE HCL 2 MG PO TABS: 2.0000 mg | ORAL_TABLET | Freq: Every evening | ORAL | 0 refills | Status: DC | PRN

## 2018-08-16 NOTE — Progress Notes (Signed)
HPI:  Using dictation device. Unfortunately this device frequently misinterprets words/phrases.  Acute visit for R mid back pain: -started 4 days ago, had lifted some tubs -6/10 dull fairly constant pain worse with twisting -mild cough, but no fever, chills, malaise, wheezing, sputum, hematuria, dysuria, radiation -has not taken anything for the pain -works out at gym  ROS: See pertinent positives and negatives per HPI.  Past Medical History:  Diagnosis Date  . Hyperlipidemia   . Hypertension     Past Surgical History:  Procedure Laterality Date  . ABDOMINAL HYSTERECTOMY  1997  . BREAST SURGERY  2002   left cyst removal   . CHOLECYSTECTOMY N/A 02/12/2018   Procedure: LAPAROSCOPIC CHOLECYSTECTOMY;  Surgeon: Leighton Ruff, MD;  Location: [facility];  Service: General;  Laterality: N/A;    History reviewed. No pertinent family history.  SOCIAL HX: see hpi   Current Outpatient Medications:  .  benzonatate (TESSALON) 200 MG capsule, Take 1 capsule (200 mg total) by mouth 3 (three) times daily as needed for cough., Disp: 90 capsule, Rfl: 2 .  chlorpheniramine (CHLOR-TRIMETON) 4 MG tablet, Take 4 mg by mouth at bedtime., Disp: , Rfl:  .  Coenzyme Q10 (CO Q 10) 100 MG CAPS, Take 1 capsule by mouth daily., Disp: , Rfl:  .  diclofenac sodium (VOLTAREN) 1 % GEL, Apply 2 g topically 4 (four) times daily as needed (for knee pain.)., Disp: 100 g, Rfl: 2 .  diltiazem (CARDIZEM CD) 240 MG 24 hr capsule, Take 1 capsule (240 mg total) by mouth daily., Disp: 30 capsule, Rfl: 11 .  pantoprazole (PROTONIX) 40 MG tablet, Take 1 tablet (40 mg total) by mouth daily. Take 30-60 min before first meal of the day (Patient taking differently: Take 40 mg by mouth daily as needed. Take 30-60 min before first meal of the day), Disp: , Rfl:  .  UNABLE TO FIND, Med Name: Ox- Bile 500 mg 1 tablet after each meal, Disp: , Rfl:  .  UNABLE TO FIND, Med Name: Course and Quicken supplement OTC 1 tab 2 x daily, Disp:  , Rfl:  .  White Petrolatum-Mineral Oil (ARTIFICIAL TEARS) ointment, as needed., Disp: , Rfl:  .  tiZANidine (ZANAFLEX) 2 MG tablet, Take 1 tablet (2 mg total) by mouth at bedtime as needed for muscle spasms., Disp: 20 tablet, Rfl: 0  EXAM:  Vitals:   08/16/18 1522  BP: 122/78  Pulse: 64  Temp: 98 F (36.7 C)    Body mass index is 37.37 kg/m.  GENERAL: vitals reviewed and listed above, alert, oriented, appears well hydrated and in no acute distress  HEENT: atraumatic, conjunttiva clear, no obvious abnormalities on inspection of external nose and ears  NECK: no obvious masses on inspection  LUNGS: clear to auscultation bilaterally, no wheezes, rales or rhonchi, good air movement  CV: HRRR, no peripheral edema  MS: moves all extremities without noticeable abnormality, TTP lower thoracic trap muscle fibers and thoracic paraspinal muscles, no bony or rib TTP, normal gait and movements  PSYCH: pleasant and cooperative, no obvious depression or anxiety  ASSESSMENT AND PLAN:  Discussed the following assessment and plan:  Acute right-sided thoracic back pain  -we discussed possible serious and likely etiologies, workup and treatment, treatment risks and return precautions - suspect musculoskeletal, no alarm features -after this discussion, Leeta opted for HEP, symptomatic care per instructions, muscle relaxer at bed if needed -follow up advised 3-4 weeks -of course, we advised Staceyann  to return or notify a  doctor immediately if symptoms worsen, new concerns arise or not improving with treatment.   Patient Instructions  BEFORE YOU LEAVE: -upper back exercises -follow up: 3-4 weeks  Do the exercises 4 days per week  Heat, tiger balm (menthol), tylenol per dosing guide as needed for pain  Muscle relaxer at night if needed  I hope you are feeling better soon! Seek care promptly if your symptoms worsen, new concerns arise or you are not improving with  treatment.        Lucretia Kern, DO

## 2018-08-16 NOTE — Patient Instructions (Signed)
BEFORE YOU LEAVE: -upper back exercises -follow up: 3-4 weeks  Do the exercises 4 days per week  Heat, tiger balm (menthol), tylenol per dosing guide as needed for pain  Muscle relaxer at night if needed  I hope you are feeling better soon! Seek care promptly if your symptoms worsen, new concerns arise or you are not improving with treatment.

## 2018-08-20 DIAGNOSIS — M25511 Pain in right shoulder: Secondary | ICD-10-CM | POA: Diagnosis not present

## 2018-08-20 DIAGNOSIS — R293 Abnormal posture: Secondary | ICD-10-CM | POA: Diagnosis not present

## 2018-08-20 DIAGNOSIS — R531 Weakness: Secondary | ICD-10-CM | POA: Diagnosis not present

## 2018-08-20 DIAGNOSIS — M25611 Stiffness of right shoulder, not elsewhere classified: Secondary | ICD-10-CM | POA: Diagnosis not present

## 2018-08-21 ENCOUNTER — Ambulatory Visit: Payer: Self-pay | Admitting: *Deleted

## 2018-08-21 NOTE — Telephone Encounter (Signed)
Noted  

## 2018-08-21 NOTE — Telephone Encounter (Signed)
Returned call to patient regarding pain in her right part of her back about halfway down and goes towards her rib cage. The pain does not go to her front rib cage. She saw a provider on the [address] and was doing what was prescribed. She stated the muscle relaxer makes her loopy. She is taking Tylenol and using heat and they help a little. She states her pain # is 4/5 but goes up to an 8 when she moves (walk or change position). The pain is constant. She denies fever, shortness of breath, chest pain, any problem with bowel or bladder.  Appointment scheduled per pt request.  Will route to flow at [facility]. Advised to call back for worsening symptoms. Pt voiced understanding.    Reason for Disposition . [1] MODERATE back pain (e.g., interferes with normal activities) AND [2] present > 3 days  Answer Assessment - Initial Assessment Questions 1. ONSET: "When did the pain begin?"      Nov 17 th 2. LOCATION: "Where does it hurt?" (upper, mid or lower back)     Right back pain  3. SEVERITY: "How bad is the pain?"  (e.g., Scale 1-10; mild, moderate, or severe)   - MILD (1-3): doesn't interfere with normal activities    - MODERATE (4-7): interferes with normal activities or awakens from sleep    - SEVERE (8-10): excruciating pain, unable to do any normal activities      Pain # between 4 and 5 now but can go up to an 8 4. PATTERN: "Is the pain constant?" (e.g., yes, no; constant, intermittent)      constant 5. RADIATION: "Does the pain shoot into your legs or elsewhere?"     Just the right side, not the front 6. CAUSE:  "What do you think is causing the back pain?"      Not sure 7. BACK OVERUSE:  "Any recent lifting of heavy objects, strenuous work or exercise?"     no 8. MEDICATIONS: "What have you taken so far for the pain?" (e.g., nothing, acetaminophen, NSAIDS)     Acetaminophen and muscle relaxer 9. NEUROLOGIC SYMPTOMS: "Do you have any weakness, numbness, or problems with  bowel/bladder control?"     no 10. OTHER SYMPTOMS: "Do you have any other symptoms?" (e.g., fever, abdominal pain, burning with urination, blood in urine)       Sweating at night 11. PREGNANCY: "Is there any chance you are pregnant?" (e.g., yes, no; LMP)       no  Protocols used: BACK PAIN-A-AH

## 2018-08-22 ENCOUNTER — Encounter: Payer: Self-pay | Admitting: Family Medicine

## 2018-08-22 ENCOUNTER — Ambulatory Visit: Payer: Medicare Other | Admitting: Family Medicine

## 2018-08-22 ENCOUNTER — Ambulatory Visit (INDEPENDENT_AMBULATORY_CARE_PROVIDER_SITE_OTHER): Payer: Medicare Other | Admitting: Family Medicine

## 2018-08-22 VITALS — BP 126/80 | HR 60 | Temp 97.7°F | Resp 12 | Ht 64.0 in | Wt 217.1 lb

## 2018-08-22 DIAGNOSIS — R7401 Elevation of levels of liver transaminase levels: Secondary | ICD-10-CM

## 2018-08-22 DIAGNOSIS — R740 Nonspecific elevation of levels of transaminase and lactic acid dehydrogenase [LDH]: Secondary | ICD-10-CM

## 2018-08-22 DIAGNOSIS — M546 Pain in thoracic spine: Secondary | ICD-10-CM

## 2018-08-22 LAB — POC URINALSYSI DIPSTICK (AUTOMATED)
BILIRUBIN UA: POSITIVE
Blood, UA: NEGATIVE
Glucose, UA: NEGATIVE
KETONES UA: NEGATIVE
LEUKOCYTES UA: NEGATIVE
Nitrite, UA: NEGATIVE
PH UA: 5.5 (ref 5.0–8.0)
Protein, UA: POSITIVE — AB
Spec Grav, UA: >=1.03 — AB (ref 1.010–1.025)
Urobilinogen, UA: 0.2 E.U./dL

## 2018-08-22 LAB — HEPATIC FUNCTION PANEL
ALBUMIN: 4.3 g/dL (ref 3.5–5.2)
ALK PHOS: 72 U/L (ref 39–117)
ALT: 14 U/L (ref 0–35)
AST: 18 U/L (ref 0–37)
Bilirubin, Direct: 0.1 mg/dL (ref 0.0–0.3)
TOTAL PROTEIN: 7.3 g/dL (ref 6.0–8.3)
Total Bilirubin: 0.5 mg/dL (ref 0.2–1.2)

## 2018-08-22 NOTE — Progress Notes (Signed)
ACUTE VISIT   HPI:  Chief Complaint  Patient presents with  . Right-sided back pain    not getting any better    [name] is a 67 y.o. female, who is here today complaining of persistent right mid thoracic pain, which started about 2 weeks ago. Denies prior Hx. No recent trauma. Constant dull pain 5/10 and intermittent sharp pain 9/10. Pain is not radiated. Exacerbated by certain movements. Alleviated by rest and/or changing position.  She has not noted local erythema,edema,or skin rash.  She has taking Tylenol several times per day.   She denies prior history of back pain, she does see Dr. Erlinda Hong for chronic right shoulder pain.  She would like to have UA , concerned about back pain related to renal disease.  She was seen on 08/16/18 with similar complain. She did not take Zanaflex because caused drowsiness.   She hadelevated transaminases a few months ago and states that renal function is being monitored. She states that she did not know about abnormal LFT's. In 01/2018 she had acute cholelithiasis,s/p cholecystectomy. Denies alcohol intake.   Lab Results  Component Value Date   CREATININE 1.09 (H) 02/12/2018   BUN 16 02/12/2018   NA 135 02/12/2018   K 4.1 02/12/2018   CL 103 02/12/2018   CO2 20 (L) 02/12/2018   Lab Results  Component Value Date   ALT 125 (H) 02/12/2018   AST 159 (H) 02/12/2018   ALKPHOS 75 02/12/2018   BILITOT 1.0 02/12/2018     Review of Systems  Constitutional: Negative for appetite change, fatigue and fever.  HENT: Negative for mouth sores, sore throat and trouble swallowing.   Respiratory: Negative for cough, shortness of breath and wheezing.   Cardiovascular: Negative for chest pain, palpitations and leg swelling.  Gastrointestinal: Negative for abdominal pain, nausea and vomiting.       No changes in bowel habits.  Genitourinary: Negative for decreased urine volume and dysuria.  Musculoskeletal: Positive for  arthralgias, back pain and myalgias.  Skin: Negative for rash.  Neurological: Negative for syncope, weakness, numbness and headaches.      Current Outpatient Medications on File Prior to Visit  Medication Sig Dispense Refill  . benzonatate (TESSALON) 200 MG capsule Take 1 capsule (200 mg total) by mouth 3 (three) times daily as needed for cough. 90 capsule 2  . chlorpheniramine (CHLOR-TRIMETON) 4 MG tablet Take 4 mg by mouth at bedtime.    . Coenzyme Q10 (CO Q 10) 100 MG CAPS Take 1 capsule by mouth daily.    . diclofenac sodium (VOLTAREN) 1 % GEL Apply 2 g topically 4 (four) times daily as needed (for knee pain.). 100 g 2  . diltiazem (CARDIZEM CD) 240 MG 24 hr capsule Take 1 capsule (240 mg total) by mouth daily. 30 capsule 11  . famotidine (PEPCID) 20 MG tablet famotidine 20 mg tablet  TAKE 1 TABLET BY MOUTH EVERYDAY AT BEDTIME    . pantoprazole (PROTONIX) 40 MG tablet Take 1 tablet (40 mg total) by mouth daily. Take 30-60 min before first meal of the day (Patient taking differently: Take 40 mg by mouth daily as needed. Take 30-60 min before first meal of the day)    . tiZANidine (ZANAFLEX) 2 MG tablet Take 1 tablet (2 mg total) by mouth at bedtime as needed for muscle spasms. 20 tablet 0  . UNABLE TO FIND Med Name: Ox- Bile 500 mg 1 tablet after each meal    .  UNABLE TO FIND Med Name: Course and Quicken supplement OTC 1 tab 2 x daily    . White Petrolatum-Mineral Oil (ARTIFICIAL TEARS) ointment as needed.     No current facility-administered medications on file prior to visit.      Past Medical History:  Diagnosis Date  . Hyperlipidemia   . Hypertension    No Known Allergies  Social History   Socioeconomic History  . Marital status: Divorced    Spouse name: Not on file  . Number of children: Not on file  . Years of education: Not on file  . Highest education level: Not on file  Occupational History  . Not on file  Social Needs  . Financial resource strain: Not on file    . Food insecurity:    Worry: Not on file    Inability: Not on file  . Transportation needs:    Medical: Not on file    Non-medical: Not on file  Tobacco Use  . Smoking status: Never Smoker  . Smokeless tobacco: Never Used  Substance and Sexual Activity  . Alcohol use: Not Currently  . Drug use: Not Currently  . Sexual activity: Not on file  Lifestyle  . Physical activity:    Days per week: Not on file    Minutes per session: Not on file  . Stress: Not on file  Relationships  . Social connections:    Talks on phone: Not on file    Gets together: Not on file    Attends religious service: Not on file    Active member of club or organization: Not on file    Attends meetings of clubs or organizations: Not on file    Relationship status: Not on file  Other Topics Concern  . Not on file  Social History Narrative  . Not on file    Vitals:   08/22/18 0847  BP: 126/80  Pulse: 60  Resp: 12  Temp: 97.7 F (36.5 C)  SpO2: 99%   Body mass index is 37.27 kg/m.   Physical Exam  Nursing note and vitals reviewed. Constitutional: She is oriented to person, place, and time. She appears well-developed. She does not appear ill. No distress.  HENT:  Head: Normocephalic and atraumatic.  Eyes: Conjunctivae are normal.  Cardiovascular: Normal rate and regular rhythm.  Respiratory: Effort normal and breath sounds normal. No respiratory distress.  GI: Soft. She exhibits no mass. There is no hepatomegaly. There is no tenderness.  Musculoskeletal: She exhibits no edema.       Thoracic back: She exhibits no tenderness and no bony tenderness.       Lumbar back: She exhibits no tenderness and no bony tenderness.       Back:  No tenderness upon palpation of affected area. No limitation of range of motion. Mild "pulling" sensation with shoulder rotation exercises.  Lymphadenopathy:    She has no cervical adenopathy.  Neurological: She is alert and oriented to person, place, and time.  She has normal strength. Gait normal.  Skin: Skin is warm. No rash noted. No erythema.  Psychiatric: She has a normal mood and affect.  Well groomed, good eye contact.      ASSESSMENT AND PLAN:  [name] was seen today for right-sided back pain.  Diagnoses and all orders for this visit:  Acute right-sided thoracic back pain I do not think imaging is needed today.  It seems to be improving. I do not think pain is caused by renal disease  but rather musculoskeletal. Urine dipstick + protein, rest negative.  Zanaflex 2-4 mg TID prn,side effects discussed,she can take med at bedtime. OTC asper cream or icy hot may also help. Acetaminophen max 2 g daily recommended. Avoid NSAID's due to renal impairment.  -     POCT Urinalysis Dipstick (Automated)  Elevated transaminase level She would like to have this checked today. Most likely related to cholelithiasis/cholecystitis, status post cholecystectomy in 01/2018. Asymptomatic  -     Hepatic function panel        Return if symptoms worsen or fail to improve.   .       Nancy Fern G. Martin[place], MD  [facility]. [place] office.

## 2018-08-22 NOTE — Patient Instructions (Addendum)
Nancy Christensen I have seen you today for an acute visit.  A few things to remember from today's visit:   Acute right-sided thoracic back pain - Plan: POCT Urinalysis Dipstick (Automated)   Because your kidney function, you cannot take naproxen or ibuprofen. Continue acetaminophen 500 mg 3-4 times per day, max dose 2 g per day due to abnormal liver test in the past. Topical icy hot or Aspercreme may also help. Voltaren gel up to 4 times per day is also effective and safer than oral Diclofenac Continue Zanaflex but take it at bedtime.   Back pain is very common in adults.The cause of back pain is rarely dangerous and the pain often gets better over time even with no pharmacologic treatment.  The cause of your back pain may not be known. Some common causes of back pain include: 1. Strain of the muscles or ligaments supporting the spine. 2. Wear and tear (degeneration) of the spinal disks. 3. Arthritis. 4. Direct injury to the back.  For many people, back pain may return. Since back pain is rarely dangerous, most people can learn to manage this condition on their own.  HOME CARE INSTRUCTIONS Watch your back pain for any changes. The following actions may help to lessen any discomfort you are feeling:  1. Remain active. It is stressful on your back to sit or stand in one place for long periods of time. Do not sit, drive, or stand in one place for more than 30 minutes at a time. Take short walks on even surfaces as soon as you are able.Try to increase the length of time you walk each day.  2. Exercise regularly as directed by your health care provider. Exercise helps your back heal faster. It also helps avoid future injury by keeping your muscles strong and flexible.  3. Do not stay in bed.Resting more than 1-2 days can delay your recovery.                                                      4. Pay attention to your body when you bend and lift. The most comfortable  positions are those that put less stress on your recovering back.  5.  Always use proper lifting techniques, including: Bending your knees. Keeping the load close to your body. Avoiding twisting.  6. Find a comfortable position to sleep. Use a firm mattress and lie on your side with your knees slightly bent. If you lie on your back, put a pillow under your knees.  7. Over the counter rubbing medications like Icy Hot or Asper cream with Lidocaine may help without significant side effects.  Acetaminophen and/or Aleve/Ibuprofen can be taken if needed and if not contraindications. Local ice and heat may be alternated to reduce pain and spasms. Also massage and even chiropractor treatment.      Muscle relaxants might or might not help, they cause drowsiness among other    side effects. They could also interact with some of medications you may be already taking (medications for depression/anxiety and some pain medications).   8. Maintain a healthy weight. Excess weight puts extra stress on your back and makes it difficult to maintain good posture.   SEEK MEDICAL CARE IF: worsening pain, associated fever, rash/edema on area, pain going to legs or buttocks, numbness/tingling, night pain,  or abnormal weight loss.    SEEK IMMEDIATE MEDICAL CARE IF:  1. You develop new bowel or bladder control problems. 2. You have unusual weakness or numbness in your arms or legs. 3. You develop nausea or vomiting. 4. You develop abdominal pain. 5. You feel faint.     Back Exercises The following exercises strengthen the muscles that help to support the back. They also help to keep the lower back flexible. Doing these exercises can help to prevent back pain or lessen existing pain. If you have back pain or discomfort, try doing these exercises 2-3 times each day or as told by your health care provider. When the pain goes away, do them once each day, but increase the number of times that you repeat the steps for  each exercise (do more repetitions). If you do not have back pain or discomfort, do these exercises once each day or as told by your health care provider.   EXERCISES Single Knee to Chest Repeat these steps 3-5 times for each leg: 5. Lie on your back on a firm bed or the floor with your legs extended. 6. Bring one knee to your chest. Your other leg should stay extended and in contact with the floor. 7. Hold your knee in place by grabbing your knee or thigh. 8. Pull on your knee until you feel a gentle stretch in your lower back. 9. Hold the stretch for 10-30 seconds. 10. Slowly release and straighten your leg.  Pelvic Tilt Repeat these steps 5-10 times: 2. Lie on your back on a firm bed or the floor with your legs extended. 3. Bend your knees so they are pointing toward the ceiling and your feet are flat on the floor. 4. Tighten your lower abdominal muscles to press your lower back against the floor. This motion will tilt your pelvis so your tailbone points up toward the ceiling instead of pointing to your feet or the floor. 5. With gentle tension and even breathing, hold this position for 5-10 seconds.  Cat-Cow Repeat these steps until your lower back becomes more flexible: 1. Get into a hands-and-knees position on a firm surface. Keep your hands under your shoulders, and keep your knees under your hips. You may place padding under your knees for comfort. 2. Let your head hang down, and point your tailbone toward the floor so your lower back becomes rounded like the back of a cat. 3. Hold this position for 5 seconds. 4. Slowly lift your head and point your tailbone up toward the ceiling so your back forms a sagging arch like the back of a cow. 5. Hold this position for 5 seconds.   Press-Ups Repeat these steps 5-10 times: 6. Lie on your abdomen (face-down) on the floor. 7. Place your palms near your head, about shoulder-width apart. 8. While you keep your back as relaxed as possible  and keep your hips on the floor, slowly straighten your arms to raise the top half of your body and lift your shoulders. Do not use your back muscles to raise your upper torso. You may adjust the placement of your hands to make yourself more comfortable. 9. Hold this position for 5 seconds while you keep your back relaxed. 10. Slowly return to lying flat on the floor.   Bridges Repeat these steps 10 times: 1. Lie on your back on a firm surface. 2. Bend your knees so they are pointing toward the ceiling and your feet are flat on the floor. 3.  Tighten your buttocks muscles and lift your buttocks off of the floor until your waist is at almost the same height as your knees. You should feel the muscles working in your buttocks and the back of your thighs. If you do not feel these muscles, slide your feet 1-2 inches farther away from your buttocks. 4. Hold this position for 3-5 seconds. 5. Slowly lower your hips to the starting position, and allow your buttocks muscles to relax completely. If this exercise is too easy, try doing it with your arms crossed over your chest.     In general please monitor for signs of worsening symptoms and seek immediate medical attention if any concerning.  If symptoms are not resolved in 2-3weeks you should schedule a follow up appointment with your doctor, before if needed.  I hope you get better soon!

## 2018-08-25 ENCOUNTER — Encounter: Payer: Self-pay | Admitting: Family Medicine

## 2018-08-28 DIAGNOSIS — R531 Weakness: Secondary | ICD-10-CM | POA: Diagnosis not present

## 2018-08-28 DIAGNOSIS — R293 Abnormal posture: Secondary | ICD-10-CM | POA: Diagnosis not present

## 2018-08-28 DIAGNOSIS — M25511 Pain in right shoulder: Secondary | ICD-10-CM | POA: Diagnosis not present

## 2018-08-28 DIAGNOSIS — M25611 Stiffness of right shoulder, not elsewhere classified: Secondary | ICD-10-CM | POA: Diagnosis not present

## 2018-08-30 DIAGNOSIS — R293 Abnormal posture: Secondary | ICD-10-CM | POA: Diagnosis not present

## 2018-08-30 DIAGNOSIS — M25611 Stiffness of right shoulder, not elsewhere classified: Secondary | ICD-10-CM | POA: Diagnosis not present

## 2018-08-30 DIAGNOSIS — R531 Weakness: Secondary | ICD-10-CM | POA: Diagnosis not present

## 2018-08-30 DIAGNOSIS — M25511 Pain in right shoulder: Secondary | ICD-10-CM | POA: Diagnosis not present

## 2018-09-02 ENCOUNTER — Encounter (HOSPITAL_COMMUNITY): Payer: Self-pay | Admitting: Emergency Medicine

## 2018-09-02 ENCOUNTER — Other Ambulatory Visit: Payer: Self-pay

## 2018-09-02 DIAGNOSIS — Z79899 Other long term (current) drug therapy: Secondary | ICD-10-CM | POA: Diagnosis not present

## 2018-09-02 DIAGNOSIS — M546 Pain in thoracic spine: Secondary | ICD-10-CM | POA: Diagnosis not present

## 2018-09-02 DIAGNOSIS — I10 Essential (primary) hypertension: Secondary | ICD-10-CM | POA: Diagnosis not present

## 2018-09-02 DIAGNOSIS — M545 Low back pain: Secondary | ICD-10-CM | POA: Insufficient documentation

## 2018-09-02 NOTE — ED Triage Notes (Signed)
Patient c/o right lower back pain x3 weeks. States she was seen at PCP and prescribed muscle relaxer with no relief. Reports taking tylenol with some relief. Denies injury. States pain worsens with movement. Denies changes in bowel or bladder, numbness and tingling. Ambulatory.

## 2018-09-03 ENCOUNTER — Emergency Department (HOSPITAL_COMMUNITY)
Admission: EM | Admit: 2018-09-03 | Discharge: 2018-09-03 | Disposition: A | Discharge status: Home / Self Care | Payer: Medicare Other | Attending: Emergency Medicine | Admitting: Emergency Medicine

## 2018-09-03 ENCOUNTER — Emergency Department (HOSPITAL_COMMUNITY): Payer: Medicare Other

## 2018-09-03 DIAGNOSIS — M546 Pain in thoracic spine: Secondary | ICD-10-CM | POA: Diagnosis not present

## 2018-09-03 DIAGNOSIS — M545 Low back pain, unspecified: Secondary | ICD-10-CM

## 2018-09-03 LAB — URINALYSIS, ROUTINE W REFLEX MICROSCOPIC
Bilirubin Urine: NEGATIVE
GLUCOSE, UA: NEGATIVE mg/dL
Hgb urine dipstick: NEGATIVE
KETONES UR: NEGATIVE mg/dL
LEUKOCYTES UA: NEGATIVE
NITRITE: NEGATIVE
PH: 5 (ref 5.0–8.0)
Protein, ur: NEGATIVE mg/dL
SPECIFIC GRAVITY, URINE: 1.011 (ref 1.005–1.030)

## 2018-09-03 MED ORDER — HYDROCODONE-ACETAMINOPHEN 5-325 MG PO TABS
1.0000 | ORAL_TABLET | Freq: Once | ORAL | Status: AC
  Administered 2018-09-03: 1 via ORAL
  Filled 2018-09-03: qty 1

## 2018-09-03 MED ORDER — HYDROCODONE-ACETAMINOPHEN 5-325 MG PO TABS: 1.0000 | ORAL_TABLET | Freq: Four times a day (QID) | ORAL | 0 refills | Status: DC | PRN

## 2018-09-03 NOTE — ED Provider Notes (Signed)
[facility] DEPT Provider Note   CSN: [id number] Arrival date & time: 09/02/18  2113     History   Chief Complaint Chief Complaint  Patient presents with  . Back Pain    HPI Nancy Christensen is a 67 y.o. female.  The history is provided by the patient.  Back Pain   This is a new problem. The current episode started more than 1 week ago. The problem occurs daily. The problem has been gradually worsening. The pain is associated with no known injury. The pain is present in the thoracic spine and lumbar spine. The quality of the pain is described as aching. The pain is severe. The symptoms are aggravated by certain positions. The pain is the same all the time. Pertinent negatives include no chest pain, no fever, no abdominal pain, no bowel incontinence, no bladder incontinence and no weakness. Treatments tried: muscle relaxants. The treatment provided no relief.  Patient with history of hyperlipidemia hypertension presents with back pain.  She reports it has been ongoing for up to 3 weeks.  No known injury.  She has seen her PCP x2, given muscle relaxers but no improvement.  She is also taking APAP without relief.  No new weakness or legs.  No incontinence.  She can ambulate. No History of spinal surgery  Past Medical History:  Diagnosis Date  . Hyperlipidemia   . Hypertension     Patient Active Problem List   Diagnosis Date Noted  . Chronic right shoulder pain 07/04/2018  . Unilateral primary osteoarthritis, left knee 06/14/2018  . Essential hypertension 04/28/2018  . Cholecystitis with cholelithiasis 02/11/2018  . Multiple pulmonary nodules determined by computed tomography of lung 01/26/2018  . Upper airway cough syndrome 01/25/2018    Past Surgical History:  Procedure Laterality Date  . ABDOMINAL HYSTERECTOMY  1997  . BREAST SURGERY  2002   left cyst removal   . CHOLECYSTECTOMY N/A 02/12/2018   Procedure: LAPAROSCOPIC CHOLECYSTECTOMY;   Surgeon: Leighton Ruff, MD;  Location: [facility]S;  Service: General;  Laterality: N/A;     OB History   None      Home Medications    Prior to Admission medications   Medication Sig Start Date End Date Taking? Authorizing Provider  acetaminophen (TYLENOL) 650 MG CR tablet Take 650 mg by mouth every 8 (eight) hours as needed for pain.   Yes [provider]  benzonatate (TESSALON) 200 MG capsule Take 1 capsule (200 mg total) by mouth 3 (three) times daily as needed for cough. 06/05/18  Yes Tanda Rockers, MD  chlorpheniramine (CHLOR-TRIMETON) 4 MG tablet Take 4 mg by mouth at bedtime.   Yes [provider]  Coenzyme Q10 (CO Q 10) 100 MG CAPS Take 1 capsule by mouth daily.   Yes [provider]  diclofenac sodium (VOLTAREN) 1 % GEL Apply 2 g topically 4 (four) times daily as needed (for knee pain.). 06/14/18  Yes Billie Ruddy, MD  diltiazem (CARDIZEM CD) 240 MG 24 hr capsule Take 1 capsule (240 mg total) by mouth daily. 04/24/18  Yes Tanda Rockers, MD  pantoprazole (PROTONIX) 40 MG tablet Take 1 tablet (40 mg total) by mouth daily. Take 30-60 min before first meal of the day Patient taking differently: Take 40 mg by mouth daily as needed (reflux). Take 30-60 min before first meal of the day 02/22/18  Yes Tanda Rockers, MD  tiZANidine (ZANAFLEX) 2 MG tablet Take 1 tablet (2 mg total) by  mouth at bedtime as needed for muscle spasms. 08/16/18  Yes Lucretia Kern, DO    Family History No family history on file.  Social History Social History   Tobacco Use  . Smoking status: Never Smoker  . Smokeless tobacco: Never Used  Substance Use Topics  . Alcohol use: Not Currently  . Drug use: Not Currently     Allergies   Patient has no known allergies.   Review of Systems Review of Systems  Constitutional: Negative for fever.  Cardiovascular: Negative for chest pain.  Gastrointestinal: Negative for abdominal pain and bowel incontinence.  Genitourinary:  Negative for bladder incontinence.  Musculoskeletal: Positive for back pain.  Neurological: Negative for weakness.  All other systems reviewed and are negative.    Physical Exam Updated Vital Signs BP (!) 179/98 Comment: RN Aware  Pulse (!) 58   Temp 98 F (36.7 C) (Oral)   Resp 16   SpO2 99%   Physical Exam CONSTITUTIONAL: Well developed/well nourished, uncomfortable appearing HEAD: Normocephalic/atraumatic EYES: EOMI/PERRL ENMT: Mucous membranes moist NECK: supple no meningeal signs SPINE/BACK:entire spine nontender, thoracic/lumbar paraspinal tenderness, No bruising/crepitance/stepoffs noted to spine CV: S1/S2 noted, no murmurs/rubs/gallops noted LUNGS: Lungs are clear to auscultation bilaterally, no apparent distress ABDOMEN: soft, nontender, no rebound or guarding GU:no cva tenderness NEURO: Awake/alert, equal motor 5/5 strength noted with the following: hip flexion/knee flexion/extension, foot dorsi/plantar flexion, great toe extension intact bilaterally,  no sensory deficit in any dermatome.   Pt is able to ambulate unassisted. EXTREMITIES: pulses normal, full ROM SKIN: warm, color normal PSYCH: no abnormalities of mood noted, alert and oriented to situation    ED Treatments / Results  Labs (all labs ordered are listed, but only abnormal results are displayed) Labs Reviewed  URINALYSIS, ROUTINE W REFLEX MICROSCOPIC    EKG None  Radiology Dg Thoracic Spine 4v  Result Date: 09/03/2018 CLINICAL DATA:  Initial evaluation for acute thoracic back pain. EXAM: THORACIC SPINE - 4+ VIEW COMPARISON:  None. FINDINGS: Mild scoliosis. Alignment otherwise normal with preservation of the normal thoracic kyphosis. Vertebral body heights maintained. No acute or chronic fracture. Degenerative intervertebral disc space narrowing with endplate spurring present within the mid and lower thoracic spine. Visualized soft tissues within normal limits. IMPRESSION: 1. No radiographic  evidence for acute abnormality within the thoracic spine. 2. 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 Electronically Signed   By: Jeannine Boga M.D.   On: 09/03/2018 02:19   Dg Lumbar Spine Complete  Result Date: 09/03/2018 CLINICAL DATA:  Initial evaluation for acute right lower back pain for 3 weeks. EXAM: LUMBAR SPINE - COMPLETE 4+ VIEW COMPARISON:  None. FINDINGS: Trace dextroscoliosis. Alignment otherwise normal with preservation of the normal lumbar lordosis. Vertebral body heights maintained without evidence for acute or chronic fracture. Visualized sacrum intact. Mild degenerative intervertebral disc space narrowing present at L2-3 and L4-5. Facet hypertrophy noted at the L4-5 level as well. Visualized soft tissues demonstrate no acute finding. Cholecystectomy clips noted. IMPRESSION: 1. No acute abnormality within the lumbar spine. 2. Mild degenerative intervertebral disc space narrowing at L2-3 and L4-5. Electronically Signed   By: Jeannine Boga M.D.   On: 09/03/2018 02:21    Procedures Procedures   Medications Ordered in ED Medications  HYDROcodone-acetaminophen (NORCO/VICODIN) 5-325 MG per tablet 1 tablet (1 tablet Oral Given 09/03/18 0228)     Initial Impression / Assessment and Plan / ED Course  I have reviewed the triage vital signs and the nursing  notes.  Pertinent labs & imaging results that were available during my care of the patient were reviewed by me and considered in my medical decision making (see chart for details).  Narcotic database reviewed and considered in decision making     3:11 AM Imaging obtained due to chronicity of pain as well as her age.  No signs of any acute fracture.  Will treat pain and reassess. 6:44 AM Pt  Improved She is ambulatory Imaging negative She will f/u with PCP if pain perists Final Clinical Impressions(s) / ED Diagnoses   Final diagnoses:  Acute midline low back pain  without sciatica    ED Discharge Orders         Ordered    HYDROcodone-acetaminophen (NORCO/VICODIN) 5-325 MG tablet  Every 6 hours PRN     09/03/18 0609           Ripley Fraise, MD 09/03/18 [phone]

## 2018-09-03 NOTE — Discharge Instructions (Addendum)

## 2018-09-04 ENCOUNTER — Ambulatory Visit (INDEPENDENT_AMBULATORY_CARE_PROVIDER_SITE_OTHER): Payer: Medicare Other | Admitting: Internal Medicine

## 2018-09-04 ENCOUNTER — Encounter: Payer: Self-pay | Admitting: Internal Medicine

## 2018-09-04 VITALS — BP 128/68 | HR 49 | Ht 64.5 in | Wt 217.6 lb

## 2018-09-04 DIAGNOSIS — R531 Weakness: Secondary | ICD-10-CM | POA: Diagnosis not present

## 2018-09-04 DIAGNOSIS — M25511 Pain in right shoulder: Secondary | ICD-10-CM | POA: Diagnosis not present

## 2018-09-04 DIAGNOSIS — R058 Other specified cough: Secondary | ICD-10-CM

## 2018-09-04 DIAGNOSIS — R918 Other nonspecific abnormal finding of lung field: Secondary | ICD-10-CM

## 2018-09-04 DIAGNOSIS — M25611 Stiffness of right shoulder, not elsewhere classified: Secondary | ICD-10-CM | POA: Diagnosis not present

## 2018-09-04 DIAGNOSIS — R293 Abnormal posture: Secondary | ICD-10-CM | POA: Diagnosis not present

## 2018-09-04 DIAGNOSIS — R059 Cough, unspecified: Secondary | ICD-10-CM

## 2018-09-04 DIAGNOSIS — R05 Cough: Secondary | ICD-10-CM

## 2018-09-04 NOTE — Progress Notes (Signed)
Subjective:    Patient ID: Nancy Christensen, female   DOB: 01/10/1951    MRN: [id number]    Brief patient profile:  36  yobf never smoker with pattern of "bronchitis" while living in [place] that impoved off ACEi in 2012/2015 with w/u reportedly neg CT chest and retired to [employer] in Nov 2018 and started there in  Dec 2018 with intermittent  chest discomfort L upper with hbp > neg cards eval but incidental SPN's > pulmonary eval around Jan 2019 > rec reheck in 6 months and so referred to pulmonary clinic 01/25/2018 by Dr   Volanda Napoleon      History of Present Illness  01/25/2018 1st [facility] office visit/ Nancy Christensen   Chief Complaint  Patient presents with  . Pulmonary Consult    Referred by Dr. Volanda Napoleon for eval of pulmonary nodules. Pt c/o cough "for years". She went to ED while in [place] in Jan 2018 with left chest discomfort and CT Chest was done. She states she feels like she needs to cough up sputum, but it gets caught in her throat. Cough is esp worse at night when she lies down.   onset of cough  Was  1980  while living in  [place]  esp p stirring around assoc with pnds eval by allergy > neg  Worse with voice use/ perfumes/ or cold air/Neg foods/ bending urge to clear the throat during the day and immediately on supine at hs but then settles down and sleeps ok  Cp's = paresthesia like x 5 sec random from 0 - 10 x daily s pattern never noct  And are milder now than when prompted original eval "like water running down the front of my chest"  rec Pantoprazole (protonix) 40 mg   Take  30-60 min before first meal of the day and Pepcid (famotidine)  20 mg one @  bedtime until return to office - this is the best way to tell whether stomach acid is contributing to your problem.   GERD diet       02/22/2018  f/u ov/Nancy Christensen re: uacs sp et 02/12/18 for Lap chole / no longer on gerd rx / using peppermints / did not bring meds  Chief Complaint  Patient presents with  . Follow-up    Cough has  improved some. She has noticed cough occurs when there is a change in the temperature. Cough has occ been prod with very min clear to yellow sputum.   Dyspnea:  Not limited by breathing from desired activities   Cough: same pattern/ notes at hs but then sleeps ok, doesn't wake her up but then "morning clearing throat x sev min x about tsp  Sleep: fine  rec GERD   Pantoprazole (protonix) 40 mg   Take  30-60 min before first meal of the day and Pepcid (famotidine)  20 mg one @  bedtime until return to office - this is the best way to tell whether stomach acid is contributing to your problem.   Prednisone 10 mg take  4 each am x 2 days,   2 each am x 2 days,  1 each am x 2 days and stop  For drainage / throat tickle try take CHLORPHENIRAMINE  4 mg - take one every 4 hours   For tickle in throat> tessalon pearls 200 mg up to three times as needed (won't make you sleepy)    Please schedule a follow up office visit in 4 weeks, sooner if  needed  with all medications /inhalers/ solutions in hand so we can verify exactly what you are taking. This includes all medications from all doctors and over the counters    03/23/2018  f/u ov/Nancy Christensen re: cough x 1980/ did not start 1st gen H1 blockers per guidelines   Chief Complaint  Patient presents with  . Follow-up    Cough is about the same. She is now producing some minimal white sputum.    Dyspnea:  Not limited by breathing from desired activities   Cough: throat clearing during the day  Sleeping: on side 2 pillows no noct symptoms  SABA use: none 02: none   rec Continue protonix 40 mg Take 30-60 min before first meal of the day and add pepcid 20 mg with or after supper until return  Gabapentin 100 mg three times a day with meals until return  For drainage / throat tickle try take CHLORPHENIRAMINE  4 mg - take one every 4 hours as needed - available over the counter- may cause drowsiness so start with just a bedtime dose or two and see how you tolerate it  before trying in daytime   Please schedule a follow up office visit in 4 weeks, sooner if needed  with all medications /inhalers/ solutions in hand so we can verify exactly what you are taking. This includes all medications from all doctors and over the counters    04/24/2018  f/u ov/Nancy Christensen re: MPN/   uacs x 1980 worse on acei/ still on arb  Chief Complaint  Patient presents with  . Follow-up    Cough has slightly improved. She has noticed she has trouble swallowing when she lies down.   Dyspnea:  Not limited by breathing from desired activities   Cough: daytime/ no longer noct  on h1 hs / tessilon helping daytime  Still feel has lump in throat  rec Gabapentin 100  2 x 3 times daily = 6 a day x one week and if not better take 100 x 3  X 3 times a day = 9 per day and call me with what dose works and you can tolerate Stop losartan and take cardizem 240 mg one daily     06/05/2018  f/u ov/Nancy Christensen re:  MPN/  / cough x 1980  Chief Complaint  Patient presents with  . Follow-up    Cough has improved. She occ produces some white to yellow sputum.    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 Follow-up    Cough has improved some. She is still clearing her throat some.    Dyspnea:  Not limited by breathing from desired activities   Cough: p stirs w/in 30 min  First gabapentin an hour later  Sleeping: sleeping fine flat bed one pillow  SABA use: none  02: none  Uses more h1 than tessalon seems to help  New positional R back pain x 3 weeks "like a catch" / aggravate by lifting L leg or rolling over  in certain  positions noct, seen in ER with djd changes T spine    No obvious day to day or daytime variability or assoc excess/ purulent sputum or mucus plugs or hemoptysis or cp or chest tightness, subjective wheeze or overt sinus or hb symptoms.   Sleeping as above  without nocturnal  or early am exacerbation  of respiratory  c/o's or need for noct saba. Also denies any obvious fluctuation of symptoms with weather or environmental changes or other aggravating or alleviating factors except as outlined above   No unusual exposure hx or h/o childhood pna/ asthma or knowledge of premature birth.  Current Allergies, Complete Past Medical History, Past Surgical History, Family History, and Social History were reviewed in [employer] record.  ROS  The following are not active complaints unless bolded Hoarseness, sore throat, dysphagia, dental problems, itching, sneezing,  nasal congestion or discharge of excess mucus or purulent secretions, ear ache,   fever, chills, sweats, unintended wt loss or wt gain, classically pleuritic or exertional cp,  orthopnea pnd or arm/hand swelling  or leg swelling, presyncope, palpitations, abdominal pain, anorexia, nausea, vomiting, diarrhea  or change in bowel habits or change in bladder habits, change in stools or change in urine, dysuria, hematuria,  rash, arthralgias, visual complaints, headache, numbness, weakness or ataxia or problems with walking or coordination,  change in mood or  memory.        Current Meds  Medication Sig  . acetaminophen (TYLENOL) 650 MG CR tablet Take 650 mg by mouth every 8 (eight) hours as needed for pain.  . benzonatate (TESSALON) 200 MG capsule Take 200 mg by mouth 3 (three) times daily as needed for cough.  . chlorpheniramine (CHLOR-TRIMETON) 4 MG tablet Take 4 mg by mouth at bedtime.  . Coenzyme Q10 (CO Q 10) 100 MG CAPS Take 1 capsule by mouth daily.  . diclofenac sodium (VOLTAREN) 1 % GEL Apply 2 g topically  4 (four) times daily as needed (for knee pain.).  Marland Kitchen diltiazem (CARDIZEM CD) 240 MG 24 hr capsule Take 1 capsule (240 mg total) by mouth daily.  . famotidine (PEPCID) 20 MG tablet Take 20 mg by mouth at bedtime.  . gabapentin (NEURONTIN) 100 MG capsule Take 100 mg by mouth 4 (four) times daily.  . meclizine (ANTIVERT) 25 MG tablet Take 25 mg by mouth 3 (three) times daily as needed for dizziness.  . pantoprazole (PROTONIX) 40 MG tablet Take 1 tablet (40 mg total) by mouth daily. Take 30-60 min before first meal of the day (Patient taking differently: Take 40 mg by mouth daily as needed (reflux). Take 30-60 min before first meal of the day)  . tiZANidine (ZANAFLEX) 2 MG tablet Take 1 tablet (2 mg total) by mouth at bedtime as needed for muscle spasms.                          Objective:   Physical Exam   amb wf dry sounding  upper airway sounding cough   09/04/2018     217  06/05/2018       213  04/24/2018       218  03/23/2018       217 02/22/2018       212   01/25/18 215 lb (97.5 kg)  01/03/18 215 lb 8 oz (97.8 kg)      Vital signs reviewed - Note on arrival 02 sats  99% on RA  HEENT: nl dentition, turbinates bilaterally, and oropharynx. Nl external ear canals without cough reflex   NECK :  without JVD/Nodes/TM/ nl carotid upstrokes bilaterally   LUNGS: no acc muscle use,  Nl contour chest which is clear to A and P bilaterally without cough on insp or exp maneuvers   CV:  RRR  no s3 or murmur or increase in P2, and no edema   ABD:  soft and nontender with nl inspiratory excursion in the supine position. No bruits or organomegaly appreciated, bowel sounds nl  MS:  Nl gait/ ext warm without deformities, calf tenderness, cyanosis or clubbing No obvious joint restrictions   SKIN: warm and dry without lesions  - no rash over painful area just lateral to lower t spine on R    NEURO:  alert, approp, nl sensorium with  no motor or cerebellar deficits apparent.                    Assessment:

## 2018-09-04 NOTE — Patient Instructions (Addendum)
Take the am gabapentin right away on rising   Bed blocks x 6-8 in head of bed   If want to see Dr Joya Gaskins at [facility] call me  Please see patient coordinator before you leave today  to schedule ct chest and we'll call the results to you  Please schedule a follow up visit in 6 months but call sooner if needed

## 2018-09-05 ENCOUNTER — Encounter: Payer: Self-pay | Admitting: Internal Medicine

## 2018-09-05 NOTE — Assessment & Plan Note (Signed)
Onset 1980 CT head 12/22/17 neg sinus dz FENO 01/25/2018  =   21 - Allergy profile 01/25/2018 >  Eos 0.1 /  IgE  17 RAST neg  - flare 02/12/18 p et  - gabapentin 100 mg tid 03/23/2018 > increased to max of 300 tid 04/24/2018 > improved so increased to 300 qid 06/05/2018   Says she is better vs baseline but still with upper airway daytime dry cough exac by voice use.  It is not present in am when wakes but w/in 30 min she's coughing but doesn't take the am dose of gabapentin until an hour or two later > strongly rec take the gabapentin immediately on rising and take as much 1st gen H1 blockers per guidelines  As she can tol during the day to eliminate the pnds sensation and if still not satisfied rec refer to [facility] voice center, Dr Joya Gaskins   I had an extended discussion with the patient reviewing all relevant studies completed to date and  lasting 15 to 20 minutes of a 25 minute visit    Each maintenance medication was reviewed in detail including most importantly the difference between maintenance and prns and under what circumstances the prns are to be triggered using an action plan format that is not reflected in the computer generated alphabetically organized AVS.     Please see AVS for specific instructions unique to this visit that I personally wrote and verbalized to the the pt in detail and then reviewed with pt  by my nurse highlighting any  changes in therapy recommended at today's visit to their plan of care.

## 2018-09-05 NOTE — Assessment & Plan Note (Signed)
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  

## 2018-09-06 DIAGNOSIS — R293 Abnormal posture: Secondary | ICD-10-CM | POA: Diagnosis not present

## 2018-09-06 DIAGNOSIS — M25611 Stiffness of right shoulder, not elsewhere classified: Secondary | ICD-10-CM | POA: Diagnosis not present

## 2018-09-06 DIAGNOSIS — R531 Weakness: Secondary | ICD-10-CM | POA: Diagnosis not present

## 2018-09-06 DIAGNOSIS — M25511 Pain in right shoulder: Secondary | ICD-10-CM | POA: Diagnosis not present

## 2018-09-10 ENCOUNTER — Encounter (HOSPITAL_COMMUNITY): Payer: Self-pay

## 2018-09-10 ENCOUNTER — Ambulatory Visit (HOSPITAL_COMMUNITY)
Admission: RE | Admit: 2018-09-10 | Discharge: 2018-09-10 | Disposition: A | Discharge status: Home / Self Care | Payer: Medicare Other | Source: Ambulatory Visit | Attending: Internal Medicine | Admitting: Internal Medicine

## 2018-09-10 DIAGNOSIS — R05 Cough: Secondary | ICD-10-CM | POA: Insufficient documentation

## 2018-09-10 DIAGNOSIS — R918 Other nonspecific abnormal finding of lung field: Secondary | ICD-10-CM | POA: Diagnosis not present

## 2018-09-10 DIAGNOSIS — R059 Cough, unspecified: Secondary | ICD-10-CM

## 2018-09-11 ENCOUNTER — Ambulatory Visit (INDEPENDENT_AMBULATORY_CARE_PROVIDER_SITE_OTHER): Payer: Self-pay | Admitting: Physical Medicine and Rehabilitation

## 2018-09-11 DIAGNOSIS — M25611 Stiffness of right shoulder, not elsewhere classified: Secondary | ICD-10-CM | POA: Diagnosis not present

## 2018-09-11 DIAGNOSIS — R531 Weakness: Secondary | ICD-10-CM | POA: Diagnosis not present

## 2018-09-11 DIAGNOSIS — M25511 Pain in right shoulder: Secondary | ICD-10-CM | POA: Diagnosis not present

## 2018-09-11 DIAGNOSIS — R293 Abnormal posture: Secondary | ICD-10-CM | POA: Diagnosis not present

## 2018-09-11 NOTE — Progress Notes (Signed)
Spoke with pt and notified of results per Dr. Wert. Pt verbalized understanding and denied any questions. 

## 2018-09-13 DIAGNOSIS — R531 Weakness: Secondary | ICD-10-CM | POA: Diagnosis not present

## 2018-09-13 DIAGNOSIS — M25511 Pain in right shoulder: Secondary | ICD-10-CM | POA: Diagnosis not present

## 2018-09-13 DIAGNOSIS — R293 Abnormal posture: Secondary | ICD-10-CM | POA: Diagnosis not present

## 2018-09-13 DIAGNOSIS — M25611 Stiffness of right shoulder, not elsewhere classified: Secondary | ICD-10-CM | POA: Diagnosis not present

## 2018-09-17 DIAGNOSIS — M25511 Pain in right shoulder: Secondary | ICD-10-CM | POA: Diagnosis not present

## 2018-09-17 DIAGNOSIS — R531 Weakness: Secondary | ICD-10-CM | POA: Diagnosis not present

## 2018-09-17 DIAGNOSIS — M25611 Stiffness of right shoulder, not elsewhere classified: Secondary | ICD-10-CM | POA: Diagnosis not present

## 2018-09-17 DIAGNOSIS — R293 Abnormal posture: Secondary | ICD-10-CM | POA: Diagnosis not present

## 2018-09-20 DIAGNOSIS — M25611 Stiffness of right shoulder, not elsewhere classified: Secondary | ICD-10-CM | POA: Diagnosis not present

## 2018-09-20 DIAGNOSIS — R531 Weakness: Secondary | ICD-10-CM | POA: Diagnosis not present

## 2018-09-20 DIAGNOSIS — R293 Abnormal posture: Secondary | ICD-10-CM | POA: Diagnosis not present

## 2018-09-20 DIAGNOSIS — M25511 Pain in right shoulder: Secondary | ICD-10-CM | POA: Diagnosis not present

## 2018-09-24 DIAGNOSIS — M25611 Stiffness of right shoulder, not elsewhere classified: Secondary | ICD-10-CM | POA: Diagnosis not present

## 2018-09-24 DIAGNOSIS — M25511 Pain in right shoulder: Secondary | ICD-10-CM | POA: Diagnosis not present

## 2018-09-24 DIAGNOSIS — R293 Abnormal posture: Secondary | ICD-10-CM | POA: Diagnosis not present

## 2018-09-24 DIAGNOSIS — R531 Weakness: Secondary | ICD-10-CM | POA: Diagnosis not present

## 2018-09-28 DIAGNOSIS — M25611 Stiffness of right shoulder, not elsewhere classified: Secondary | ICD-10-CM | POA: Diagnosis not present

## 2018-09-28 DIAGNOSIS — R531 Weakness: Secondary | ICD-10-CM | POA: Diagnosis not present

## 2018-09-28 DIAGNOSIS — R293 Abnormal posture: Secondary | ICD-10-CM | POA: Diagnosis not present

## 2018-09-28 DIAGNOSIS — M25511 Pain in right shoulder: Secondary | ICD-10-CM | POA: Diagnosis not present

## 2018-10-02 ENCOUNTER — Ambulatory Visit: Payer: Self-pay

## 2018-10-02 DIAGNOSIS — M25611 Stiffness of right shoulder, not elsewhere classified: Secondary | ICD-10-CM | POA: Diagnosis not present

## 2018-10-02 DIAGNOSIS — R293 Abnormal posture: Secondary | ICD-10-CM | POA: Diagnosis not present

## 2018-10-02 DIAGNOSIS — R531 Weakness: Secondary | ICD-10-CM | POA: Diagnosis not present

## 2018-10-02 DIAGNOSIS — M25511 Pain in right shoulder: Secondary | ICD-10-CM | POA: Diagnosis not present

## 2018-10-02 NOTE — Telephone Encounter (Signed)
Noted  

## 2018-10-02 NOTE — Telephone Encounter (Signed)
Pt. Reports she started feeling dizzy and lightheaded 1 week ago. Notices it "mainly when I'm up moving around." Denies any vertigo.Eating and drinking well. Reports no changes in medications.No recent illness.Reports BP "running a little low. 117/67, 142/79 and 117/72. Appointment made for tomorrow. Instructed if symptoms worsen before appointment to go to ED. Verbalizes understanding. Reason for Disposition . [1] MODERATE dizziness (e.g., interferes with normal activities) AND [2] has NOT been evaluated by physician for this  (Exception: dizziness caused by heat exposure, sudden standing, or poor fluid intake)  Answer Assessment - Initial Assessment Questions 1. DESCRIPTION: "Describe your dizziness."     Lightheaded 2. LIGHTHEADED: "Do you feel lightheaded?" (e.g., somewhat faint, woozy, weak upon standing)     Woozy 3. VERTIGO: "Do you feel like either you or the room is spinning or tilting?" (i.e. vertigo)     No 4. SEVERITY: "How bad is it?"  "Do you feel like you are going to faint?" "Can you stand and walk?"   - MILD - walking normally   - MODERATE - interferes with normal activities (e.g., work, school)    - SEVERE - unable to stand, requires support to walk, feels like passing out now.      Mild 5. ONSET:  "When did the dizziness begin?"     Started 1 week ago 6. AGGRAVATING FACTORS: "Does anything make it worse?" (e.g., standing, change in head position)     No 7. HEART RATE: "Can you tell me your heart rate?" "How many beats in 15 seconds?"  (Note: not all patients can do this)       No 8. CAUSE: "What do you think is causing the dizziness?"     Unsure 9. RECURRENT SYMPTOM: "Have you had dizziness before?" If so, ask: "When was the last time?" "What happened that time?"     No 10. OTHER SYMPTOMS: "Do you have any other symptoms?" (e.g., fever, chest pain, vomiting, diarrhea, bleeding)       Nausea   11. PREGNANCY: "Is there any chance you are pregnant?" "When was your last  menstrual period?"       n/a  Protocols used: DIZZINESS [facility]

## 2018-10-03 ENCOUNTER — Encounter: Payer: Self-pay | Admitting: Family Medicine

## 2018-10-03 ENCOUNTER — Ambulatory Visit (INDEPENDENT_AMBULATORY_CARE_PROVIDER_SITE_OTHER): Payer: Medicare Other | Admitting: Family Medicine

## 2018-10-03 VITALS — BP 120/80 | HR 60 | Temp 98.4°F | Resp 16 | Ht 64.5 in | Wt 218.1 lb

## 2018-10-03 DIAGNOSIS — R001 Bradycardia, unspecified: Secondary | ICD-10-CM | POA: Diagnosis not present

## 2018-10-03 DIAGNOSIS — R0789 Other chest pain: Secondary | ICD-10-CM | POA: Diagnosis not present

## 2018-10-03 DIAGNOSIS — R42 Dizziness and giddiness: Secondary | ICD-10-CM | POA: Diagnosis not present

## 2018-10-03 DIAGNOSIS — I10 Essential (primary) hypertension: Secondary | ICD-10-CM | POA: Diagnosis not present

## 2018-10-03 LAB — CBC
HCT: 41.1 % (ref 36.0–46.0)
Hemoglobin: 13.8 g/dL (ref 12.0–15.0)
MCHC: 33.5 g/dL (ref 30.0–36.0)
MCV: 85.8 fl (ref 78.0–100.0)
Platelets: 212 10*3/uL (ref 150.0–400.0)
RBC: 4.8 Mil/uL (ref 3.87–5.11)
RDW: 14.5 % (ref 11.5–15.5)
WBC: 7.1 10*3/uL (ref 4.0–10.5)

## 2018-10-03 LAB — BASIC METABOLIC PANEL
BUN: 14 mg/dL (ref 6–23)
CALCIUM: 9.8 mg/dL (ref 8.4–10.5)
CO2: 28 mEq/L (ref 19–32)
Chloride: 105 mEq/L (ref 96–112)
Creatinine, Ser: 1.05 mg/dL (ref 0.40–1.20)
GFR: 66.94 mL/min (ref >60.00)
Glucose, Bld: 84 mg/dL (ref 70–99)
Potassium: 3.9 mEq/L (ref 3.5–5.1)
SODIUM: 139 meq/L (ref 135–145)

## 2018-10-03 NOTE — Patient Instructions (Addendum)
A few things to remember from today's visit:   Dizziness - Plan: EKG 60-FUXN, Basic metabolic panel, CBC  Essential hypertension - Plan: EKG 12-Lead  Chest discomfort Continue monitoring blood pressure at home. Fall precautions.

## 2018-10-03 NOTE — Progress Notes (Signed)
HPI:  Chief Complaint  Patient presents with  . Dizziness    x 1 week, worse with movement    [name] is a 68 y.o. female, who is here today complaining of 1 weeks of dizziness. It is not like spinning sensation. She feels like "everything is closing out" and like she is going to "pas out." She has had 3 episodes, exacerbated by exercise.She is doing PT for right shoulder pain and goes to the gym a few times per week. Alleviated by rest. Episodes last about 10 min. Associated diaphoresis, no dyspnea,palpitation,or chest pain associated. "Little" nauseated. Last episode 2 days ago.  Negative for associated headache, visual changes, chest pain,dyspnea, palpitation, or syncope. Negative for hearing loss or recent URI or travel. Stable tinnitus.  She has had vertigo before x 1 in 11/2017 but it is different.   HTN, she discontinued Diltiazem 3-4 days ago because BP was "low." 113/60 and 116/60 with HR 44 and 56 respectively. This morning BP was 120/72. Denies severe/frequent headache, visual changes,focal weakness, or edema.  "Trickle" like sensation left upper chest, starts in infraclavicular area down to mid chest, it lasts about 1 min. It is not radiated but she feels "tightness" sensation around proximal LUE that lasts about 3 min. This happen at night when she is resting. She takes Aspirin.   Review of Systems  Constitutional: Positive for diaphoresis. Negative for activity change and appetite change.  HENT: Negative for mouth sores and trouble swallowing.   Respiratory: Negative for cough, shortness of breath and wheezing.   Cardiovascular: Negative for palpitations and leg swelling.  Gastrointestinal: Positive for nausea.  Genitourinary: Negative for decreased urine volume, dysuria and hematuria.  Musculoskeletal: Positive for arthralgias and myalgias. Negative for gait problem.  Skin: Negative for pallor and rash.  Neurological: Positive for  dizziness. Negative for syncope, weakness and headaches.    Current Outpatient Medications on File Prior to Visit  Medication Sig Dispense Refill  . diltiazem (CARDIZEM CD) 240 MG 24 hr capsule Take 1 capsule (240 mg total) by mouth daily. (Patient not taking: Reported on 10/03/2018) 30 capsule 11   No current facility-administered medications on file prior to visit.      Past Medical History:  Diagnosis Date  . Hyperlipidemia   . Hypertension     Drug Allergies: No Known Allergies   Social History   Socioeconomic History  . Marital status: Divorced    Spouse name: Not on file  . Number of children: Not on file  . Years of education: Not on file  . Highest education level: Not on file  Occupational History  . Not on file  Social Needs  . Financial resource strain: Not on file  . Food insecurity:    Worry: Not on file    Inability: Not on file  . Transportation needs:    Medical: Not on file    Non-medical: Not on file  Tobacco Use  . Smoking status: Never Smoker  . Smokeless tobacco: Never Used  Substance and Sexual Activity  . Alcohol use: Not Currently  . Drug use: Not Currently  . Sexual activity: Not on file  Lifestyle  . Physical activity:    Days per week: Not on file    Minutes per session: Not on file  . Stress: Not on file  Relationships  . Social connections:    Talks on phone: Not on file    Gets together: Not on file  Attends religious service: Not on file    Active member of club or organization: Not on file    Attends meetings of clubs or organizations: Not on file    Relationship status: Not on file  Other Topics Concern  . Not on file  Social History Narrative  . Not on file    Vitals:   10/03/18 1128  BP: 120/80  Pulse: 60  Resp: 16  Temp: 98.4 F (36.9 C)  SpO2: 98%   Body mass index is 36.86 kg/m.   Physical Exam  Nursing note and vitals reviewed. Constitutional: She is oriented to person, place, and time. She appears  well-developed. No distress.  HENT:  Head: Normocephalic and atraumatic.  Mouth/Throat: Oropharynx is clear and moist and mucous membranes are normal.  Eyes: Pupils are equal, round, and reactive to light. Conjunctivae are normal.  Cardiovascular: Normal rate and regular rhythm.  Murmur (? soft SEM LUSB) heard. Pulses:      Dorsalis pedis pulses are 2+ on the right side and 2+ on the left side.  Respiratory: Effort normal and breath sounds normal. No respiratory distress. She exhibits no tenderness.  GI: Soft. She exhibits no mass. There is no hepatomegaly. There is no abdominal tenderness.  Musculoskeletal:        General: Edema (1+ pitting LE edema, bilateral.) present.  Lymphadenopathy:    She has no cervical adenopathy.  Neurological: She is alert and oriented to person, place, and time. She has normal strength. No cranial nerve deficit. Gait normal.  Skin: Skin is warm. No rash noted. No erythema.  Psychiatric: Her mood appears anxious.  Well groomed, good eye contact.     ASSESSMENT AND PLAN:  [name] was seen today for dizziness.  Diagnoses and all orders for this visit:  Lab Results  Component Value Date   WBC 7.1 10/03/2018   HGB 13.8 10/03/2018   HCT 41.1 10/03/2018   MCV 85.8 10/03/2018   PLT 212.0 10/03/2018   Lab Results  Component Value Date   CREATININE 1.05 10/03/2018   BUN 14 10/03/2018   NA 139 10/03/2018   K 3.9 10/03/2018   CL 105 10/03/2018   CO2 28 10/03/2018    Dizziness We discussed possible etiologies, it does not seem to be vertigo. ? Hypotension,bradycardia. Currently asymptomatic. Adequate hydration. Fall precautions. Further recommendations will be given according to lab results.  -     EKG 12-Lead -     Basic metabolic panel -     CBC  Essential hypertension Continue non pharmacologic treatment. Monitor BP daily. Continue low salt diet.  -     EKG 12-Lead  Chest discomfort       We discussed possible causes, it is not  the typical cardiac CP but explained that the probability is never zero.        EKG today sinus bradycardia,notmal axis and intervals, ? LAE. Compared with EKG in 11/2017 no significant changes.        We discussed options,including cardiology referral, she prefers to hold on referral for now.        Continue PT exercises but stop gym exercises for now.        Clearly instructed about warning signs.        F/U in 10 days with PCP.       -     EKG 12-Lead  Bradycardia, sinus Mild. It seems like it has been going on for a few months. Continue monitoring HR at  home.     Iasia Forcier G. [place], MD  [facility]. [place] office.

## 2018-10-10 DIAGNOSIS — M25611 Stiffness of right shoulder, not elsewhere classified: Secondary | ICD-10-CM | POA: Diagnosis not present

## 2018-10-10 DIAGNOSIS — R293 Abnormal posture: Secondary | ICD-10-CM | POA: Diagnosis not present

## 2018-10-10 DIAGNOSIS — R531 Weakness: Secondary | ICD-10-CM | POA: Diagnosis not present

## 2018-10-10 DIAGNOSIS — M25511 Pain in right shoulder: Secondary | ICD-10-CM | POA: Diagnosis not present

## 2018-10-12 ENCOUNTER — Encounter: Payer: Self-pay | Admitting: Family Medicine

## 2018-10-12 ENCOUNTER — Ambulatory Visit (INDEPENDENT_AMBULATORY_CARE_PROVIDER_SITE_OTHER): Payer: Medicare Other | Admitting: Family Medicine

## 2018-10-12 VITALS — Temp 98.0°F | Wt 217.0 lb

## 2018-10-12 DIAGNOSIS — R0789 Other chest pain: Secondary | ICD-10-CM | POA: Diagnosis not present

## 2018-10-12 DIAGNOSIS — R531 Weakness: Secondary | ICD-10-CM | POA: Diagnosis not present

## 2018-10-12 DIAGNOSIS — R42 Dizziness and giddiness: Secondary | ICD-10-CM

## 2018-10-12 DIAGNOSIS — M25511 Pain in right shoulder: Secondary | ICD-10-CM | POA: Diagnosis not present

## 2018-10-12 DIAGNOSIS — M25611 Stiffness of right shoulder, not elsewhere classified: Secondary | ICD-10-CM | POA: Diagnosis not present

## 2018-10-12 DIAGNOSIS — R293 Abnormal posture: Secondary | ICD-10-CM | POA: Diagnosis not present

## 2018-10-12 NOTE — Progress Notes (Signed)
Subjective:    Patient ID: Nancy Christensen, female    DOB: 06-07-1951, 68 y.o.   MRN: [id number]  No chief complaint on file.   HPI Patient was seen today for follow-up.  Pt endorses low blood pressure, bradycardia, dizziness, sweating, chest discomfort x 2 weeks.  Pt seen in clinic on 10/03/2018 for similar symptoms.  At that time EKG with sinus bradycardia and questionable LAE.  CBC and BMP were normal.  Pt was advised to stop exercising at the gym but to continue PT.    Since that visit pt w/ continued changes in heart rate and low BP.  Pt stopped taking BP meds.  Pt was previously on losartan 50 mg, however that was changed in August to Cardizem 240 mg by her pulmonologist.  Pt notes discomfort in middle of chest feeling like "a trickle of water".  Pt also with a tightness in her upper left arm that feels like a BP cuff is squeezing her.  Pt notes these feelings more during exercise.  Pt drinking 1 gallon of water/day and a cup of decaf green tea.  Pt endorses having a stress test ~1 yr ago when in [place]. Past Medical History:  Diagnosis Date  . Hyperlipidemia   . Hypertension     No Known Allergies  ROS General: Denies fever, chills, night sweats, changes in weight, changes in appetite  + diaphoresis, dizziness HEENT: Denies headaches, ear pain, changes in vision, rhinorrhea, sore throat CV: Denies palpitations, SOB, orthopnea  +chest and L arm discomfort, bradycardia Pulm: Denies SOB, cough, wheezing GI: Denies abdominal pain, nausea, vomiting, diarrhea, constipation GU: Denies dysuria, hematuria, frequency, vaginal discharge Msk: Denies muscle cramps, joint pains Neuro: Denies weakness, numbness, tingling Skin: Denies rashes, bruising Psych: Denies depression, anxiety, hallucinations     Objective:    Temperature 98 F (36.7 C), temperature source Oral, weight 217 lb (98.4 kg), SpO2 98 %, peak flow 98 L/min.  Orthostatic vs done.  Gen. Pleasant, well-nourished, in no  distress, normal affect   HEENT: Warm [place]/AT, face symmetric, no scleral icterus, PERRLA, nares patent without drainage Neck: No JVD, no thyromegaly, no carotid bruits Lungs: no accessory muscle use, CTAB, no wheezes or rales Cardiovascular: RRR, no m/r/g, no peripheral edema Neuro:  A&Ox3, CN II-XII intact, normal gait  Wt Readings from Last 3 Encounters:  10/12/18 217 lb (98.4 kg)  10/03/18 218 lb 2 oz (98.9 kg)  09/04/18 217 lb 9.6 oz (98.7 kg)    Lab Results  Component Value Date   WBC 7.1 10/03/2018   HGB 13.8 10/03/2018   HCT 41.1 10/03/2018   PLT 212.0 10/03/2018   GLUCOSE 84 10/03/2018   CHOL 263 (A) 01/24/2018   TRIG 143 01/24/2018   HDL 55 01/24/2018   LDLCALC 179 01/24/2018   ALT 14 08/22/2018   AST 18 08/22/2018   NA 139 10/03/2018   K 3.9 10/03/2018   CL 105 10/03/2018   CREATININE 1.05 10/03/2018   BUN 14 10/03/2018   CO2 28 10/03/2018    Assessment/Plan:  Dizziness  -asymptomatic at this time -likely 2/2 hypotension, bradycardia, arrhythmia, medications-not likely as d/c'd meds, vertigo-not like pt's typical symptoms -orthostatics done and largely normal -given handout.  Encouraged to stay hydrated with water and other fluids -BMP and CBC in 10/03/2018 normal - Plan: Ambulatory referral to Cardiology  Chest discomfort -Asymptomatic at this visit -Concerns for coronary artery disease of total cholesterol was elevated at 263 and triglycerides were 143 on 01/24/2018. -EKG not repeated  as patient without symptoms at this time -Given RTC or ED precautions for continued symptoms. - Plan: Ambulatory referral to Cardiology  F/u prn  Grier Mitts, MD

## 2018-10-12 NOTE — Patient Instructions (Signed)
Dizziness Dizziness is a common problem. It is a feeling of unsteadiness or light-headedness. You may feel like you are about to faint. Dizziness can lead to injury if you stumble or fall. Anyone can become dizzy, but dizziness is more common in older adults. This condition can be caused by a number of things, including medicines, dehydration, or illness. Follow these instructions at home: Eating and drinking  Drink enough fluid to keep your urine clear or pale yellow. This helps to keep you from becoming dehydrated. Try to drink more clear fluids, such as water.  Do not drink alcohol.  Limit your caffeine intake if told to do so by your health care provider. Check ingredients and nutrition facts to see if a food or beverage contains caffeine.  Limit your salt (sodium) intake if told to do so by your health care provider. Check ingredients and nutrition facts to see if a food or beverage contains sodium. Activity  Avoid making quick movements. ? Rise slowly from chairs and steady yourself until you feel okay. ? In the morning, first sit up on the side of the bed. When you feel okay, stand slowly while you hold onto something until you know that your balance is fine.  If you need to stand in one place for a long time, move your legs often. Tighten and relax the muscles in your legs while you are standing.  Do not drive or use heavy machinery if you feel dizzy.  Avoid bending down if you feel dizzy. Place items in your home so that they are easy for you to reach without leaning over. Lifestyle  Do not use any products that contain nicotine or tobacco, such as cigarettes and e-cigarettes. If you need help quitting, ask your health care provider.  Try to reduce your stress level by using methods such as yoga or meditation. Talk with your health care provider if you need help to manage your stress. General instructions  Watch your dizziness for any changes.  Take over-the-counter and  prescription medicines only as told by your health care provider. Talk with your health care provider if you think that your dizziness is caused by a medicine that you are taking.  Tell a friend or a family member that you are feeling dizzy. If he or she notices any changes in your behavior, have this person call your health care provider.  Keep all follow-up visits as told by your health care provider. This is important. Contact a health care provider if:  Your dizziness does not go away.  Your dizziness or light-headedness gets worse.  You feel nauseous.  You have reduced hearing.  You have new symptoms.  You are unsteady on your feet or you feel like the room is spinning. Get help right away if:  You vomit or have diarrhea and are unable to eat or drink anything.  You have problems talking, walking, swallowing, or using your arms, hands, or legs.  You feel generally weak.  You are not thinking clearly or you have trouble forming sentences. It may take a friend or family member to notice this.  You have chest pain, abdominal pain, shortness of breath, or sweating.  Your vision changes.  You have any bleeding.  You have a severe headache.  You have neck pain or a stiff neck.  You have a fever. These symptoms may represent a serious problem that is an emergency. Do not wait to see if the symptoms will go away. Get medical help   right away. Call your local emergency services (911 in the [place] Do not drive yourself to the hospital. Summary  Dizziness is a feeling of unsteadiness or light-headedness. This condition can be caused by a number of things, including medicines, dehydration, or illness.  Anyone can become dizzy, but dizziness is more common in older adults.  Drink enough fluid to keep your urine clear or pale yellow. Do not drink alcohol.  Avoid making quick movements if you feel dizzy. Monitor your dizziness for any changes. This information is not intended to  replace advice given to you by your health care provider. Make sure you discuss any questions you have with your health care provider. Document Released: 03/08/2001 Document Revised: 10/15/2016 Document Reviewed: 10/15/2016 Elsevier Interactive Patient Education  2019 [employer]  Nonspecific Chest Pain, Adult Chest pain can be caused by many different conditions. It can be caused by a condition that is life-threatening and requires treatment right away. It can also be caused by something that is not life-threatening. If you have chest pain, it can be hard to know the difference, so it is important to get help right away to make sure that you do not have a serious condition. Some life-threatening causes of chest pain include:  Heart attack.  A tear in the body's main blood vessel (aortic dissection).  Inflammation around your heart (pericarditis).  A problem in the lungs, such as a blood clot (pulmonary embolism) or a collapsed lung (pneumothorax). Some non life-threatening causes of chest pain include:  Heartburn.  Anxiety or stress.  Damage to the bones, muscles, and cartilage that make up your chest wall.  Pneumonia or bronchitis.  Shingles infection (varicella-zoster virus). Chest pain can feel like:  Pain or discomfort on the surface of your chest or deep in your chest.  Crushing, pressure, aching, or squeezing pain.  Burning or tingling.  Dull or sharp pain that is worse when you move, cough, or take a deep breath.  Pain or discomfort that is also felt in your back, neck, jaw, shoulder, or arm, or pain that spreads to any of these areas. Your chest pain may come and go. It may also be constant. Your health care provider will do lab tests and other studies to find the cause of your pain. Treatment will depend on the cause of your chest pain. Follow these instructions at home: Medicines  Take over-the-counter and prescription medicines only as told by your health care  provider.  If you were prescribed an antibiotic, take it as told by your health care provider. Do not stop taking the antibiotic even if you start to feel better. Lifestyle   Rest as directed by your health care provider.  Do not use any products that contain nicotine or tobacco, such as cigarettes and e-cigarettes. If you need help quitting, ask your health care provider.  Do not drink alcohol.  Make healthy lifestyle choices as recommended. These may include: ? Getting regular exercise. Ask your health care provider to suggest some activities that are safe for you. ? Eating a heart-healthy diet. This includes plenty of fresh fruits and vegetables, whole grains, low-fat (lean) protein, and low-fat dairy products. A dietitian can help you find healthy eating options. ? Maintaining a healthy weight. ? Managing any other health conditions you have, such as high blood pressure (hypertension) or diabetes. ? Reducing stress, such as with yoga or relaxation techniques. General instructions  Pay attention to any changes in your symptoms. Tell your health care provider  about them or any new symptoms.  Avoid any activities that cause chest pain.  Keep all follow-up visits as told by your health care provider. This is important. This includes visits for any further testing if your chest pain does not go away. Contact a health care provider if:  Your chest pain does not go away.  You feel depressed.  You have a fever. Get help right away if:  Your chest pain gets worse.  You have a cough that gets worse, or you cough up blood.  You have severe pain in your abdomen.  You faint.  You have sudden, unexplained chest discomfort.  You have sudden, unexplained discomfort in your arms, back, neck, or jaw.  You have shortness of breath at any time.  You suddenly start to sweat, or your skin gets clammy.  You feel nausea or you vomit.  You suddenly feel lightheaded or dizzy.  You  have severe weakness, or unexplained weakness or fatigue.  Your heart begins to beat quickly, or it feels like it is skipping beats. These symptoms may represent a serious problem that is an emergency. Do not wait to see if the symptoms will go away. Get medical help right away. Call your local emergency services (911 in the [place] Do not drive yourself to the hospital. Summary  Chest pain can be caused by a condition that is serious and requires urgent treatment. It may also be caused by something that is not life-threatening.  If you have chest pain, it is very important to see your health care provider. Your health care provider may do lab tests and other studies to find the cause of your pain.  Follow your health care provider's instructions on taking medicines, making lifestyle changes, and getting emergency treatment if symptoms become worse.  Keep all follow-up visits as told by your health care provider. This includes visits for any further testing if your chest pain does not go away. This information is not intended to replace advice given to you by your health care provider. Make sure you discuss any questions you have with your health care provider. Document Released: 06/22/2005 Document Revised: 03/15/2018 Document Reviewed: 03/15/2018 Elsevier Interactive Patient Education  2019 [employer].

## 2018-10-16 DIAGNOSIS — R531 Weakness: Secondary | ICD-10-CM | POA: Diagnosis not present

## 2018-10-16 DIAGNOSIS — M25611 Stiffness of right shoulder, not elsewhere classified: Secondary | ICD-10-CM | POA: Diagnosis not present

## 2018-10-16 DIAGNOSIS — R293 Abnormal posture: Secondary | ICD-10-CM | POA: Diagnosis not present

## 2018-10-16 DIAGNOSIS — M25511 Pain in right shoulder: Secondary | ICD-10-CM | POA: Diagnosis not present

## 2018-10-18 DIAGNOSIS — M25511 Pain in right shoulder: Secondary | ICD-10-CM | POA: Diagnosis not present

## 2018-10-18 DIAGNOSIS — R293 Abnormal posture: Secondary | ICD-10-CM | POA: Diagnosis not present

## 2018-10-18 DIAGNOSIS — M25611 Stiffness of right shoulder, not elsewhere classified: Secondary | ICD-10-CM | POA: Diagnosis not present

## 2018-10-18 DIAGNOSIS — R531 Weakness: Secondary | ICD-10-CM | POA: Diagnosis not present

## 2018-10-25 ENCOUNTER — Ambulatory Visit (INDEPENDENT_AMBULATORY_CARE_PROVIDER_SITE_OTHER): Payer: Medicare Other | Admitting: Cardiology

## 2018-10-25 ENCOUNTER — Encounter: Payer: Self-pay | Admitting: Cardiology

## 2018-10-25 VITALS — BP 138/80 | HR 65 | Ht 64.0 in | Wt 217.0 lb

## 2018-10-25 DIAGNOSIS — R001 Bradycardia, unspecified: Secondary | ICD-10-CM

## 2018-10-25 DIAGNOSIS — R42 Dizziness and giddiness: Secondary | ICD-10-CM

## 2018-10-25 DIAGNOSIS — R0789 Other chest pain: Secondary | ICD-10-CM

## 2018-10-25 DIAGNOSIS — I10 Essential (primary) hypertension: Secondary | ICD-10-CM | POA: Diagnosis not present

## 2018-10-25 DIAGNOSIS — E785 Hyperlipidemia, unspecified: Secondary | ICD-10-CM

## 2018-10-25 HISTORY — DX: Hyperlipidemia, unspecified: E78.5

## 2018-10-25 HISTORY — DX: Other chest pain: R07.89

## 2018-10-25 HISTORY — DX: Dizziness and giddiness: R42

## 2018-10-25 HISTORY — DX: Bradycardia, unspecified: R00.1

## 2018-10-25 NOTE — Patient Instructions (Signed)
Medication Instructions:  Your physician recommends that you continue on your current medications as directed. Please refer to the Current Medication list given to you today.  If you need a refill on your cardiac medications before your next appointment, please call your pharmacy.   Lab work: None  If you have labs (blood work) drawn today and your tests are completely normal, you will receive your results only by: Marland Kitchen MyChart Message (if you have MyChart) OR . A paper copy in the mail If you have any lab test that is abnormal or we need to change your treatment, we will call you to review the results.  Testing/Procedures: Your physician has requested that you have a stress echocardiogram. For further information please visit [email or web address]. Please follow instruction sheet as given.  Your physician has recommended that you wear a holter monitor. Holter monitors are medical devices that record the heart's electrical activity. Doctors most often use these monitors to diagnose arrhythmias. Arrhythmias are problems with the speed or rhythm of the heartbeat. The monitor is a small, portable device. You can wear one while you do your normal daily activities. This is usually used to diagnose what is causing palpitations/syncope (passing out). Wear for 48 hours.   Follow-Up: At [facility], you and your health needs are our priority.  As part of our continuing mission to provide you with exceptional heart care, we have created designated Provider Care Teams.  These Care Teams include your primary Cardiologist (physician) and Advanced Practice Providers (APPs -  Physician Assistants and Nurse Practitioners) who all work together to provide you with the care you need, when you need it. You will need a follow up appointment in 1 months.      Exercise Stress Test An exercise stress test is a test to check how your heart works during exercise. You will need to walk on a treadmill or ride an  exercise bike for this test. An electrocardiogram (ECG) will record your heartbeat when you are at rest and when you are exercising. You may have an ultrasound or nuclear test after the exercise test. The test is done to check for coronary artery disease (CAD). It is also done to:  See how well you can exercise.  Watch for high blood pressure during exercise.  Test how well you can exercise after treatment.  Check the blood flow to your arms and legs. If your test result is not normal, more testing may be needed. What happens before the procedure?  Follow instructions from your doctor about what you cannot eat or drink. ? Do not have any drinks or foods that have caffeine in them for 24 hours before the test, or as told by your doctor. This includes coffee, tea (even decaf tea), sodas, chocolate, and cocoa.  Ask your doctor about changing or stopping your normal medicines. This is important if you: ? Take diabetes medicines. ? Take beta-blocker medicines. ? Wear a nitroglycerin patch.  If you use an inhaler, bring it with you to the test.  Do not put lotions, powders, creams, or oils on your chest before the test.  Wear comfortable shoes and clothing.  Do not use any products that have nicotine or tobacco in them, such as cigarettes and e-cigarettes. Stop using them at least 4 hours before the test. If you need help quitting, ask your doctor. What happens during the procedure?  Patches (electrodes) will be put on your chest.  Wires will be connected to the patches. The wires  will send signals to a machine to record your heartbeat.  Your heart rate will be watched while you are resting and while you are exercising. Your blood pressure will also be watched during the test.  You will walk on a treadmill or use a stationary bike. If you cannot use these, you may be asked to turn a crank with your hands.  The activity will get harder and will raise your heart rate.  You may be asked  to breathe into a tube a few times during the test. This measures the gases that you breathe out.  You will be asked how you are feeling throughout the test.  You will exercise until your heart reaches a target heart rate. You will stop early if: ? You feel dizzy. ? You have chest pain. ? You are out of breath. ? Your blood pressure is too high or too low. ? You have an irregular heartbeat. ? You have pain or aching in your arms or legs. The procedure may vary among doctors and hospitals. What happens after the procedure?  Your blood pressure, heart rate, breathing rate, and blood oxygen level will be watched after the test.  You may return to your normal diet and activities as told by your doctor.  It is up to you to get the results of your test. Ask your doctor, or the department that is doing the test, when your results will be ready. Summary  An exercise stress test is a test to check how your heart works during exercise.  This test is done to check for coronary artery disease.  Your heart rate will be watched while you are resting and while you are exercising.  Follow instructions from your doctor about what you cannot eat or drink before the test. This information is not intended to replace advice given to you by your health care provider. Make sure you discuss any questions you have with your health care provider. Document Released: 02/29/2008 Document Revised: 12/13/2016 Document Reviewed: 12/13/2016 Elsevier Interactive Patient Education  2019 [employer].     Ambulatory Cardiac Monitoring An ambulatory cardiac monitor is a small recording device that is used to detect abnormal heart rhythms (arrhythmias). Most monitors are connected by wires to flat, sticky disks (electrodes) that are then attached to your chest. You may need to wear a monitor if you have had symptoms such as:  Fast heartbeats (palpitations).  Dizziness.  Fainting or  light-headedness.  Unexplained weakness.  Shortness of breath. There are several types of monitors. Some common monitors include:  Holter monitor. This records your heart rhythm continuously, usually for 24-48 hours.  Event (episodic) monitor. This monitor has a symptoms button, and when pushed, it will begin recording. You need to activate this monitor to record when you have a heart-related symptom.  Automatic detection monitor. This monitor will begin recording when it detects an abnormal heartbeat. What are the risks? Generally, these devices are safe to use. However, it is possible that the skin under the electrodes will become irritated. How to prepare for monitoring Your health care provider will prepare your chest for the electrode placement and show you how to use the monitor.  Do not apply lotions to your chest before monitoring.  Follow directions on how to care for the monitor, and how to return the monitor when the testing period is complete. How to use your cardiac monitor  Follow directions about how long to wear the monitor, and if you can take  the monitor off in order to shower or bathe. ? Do not let the monitor get wet. ? Do not bathe, swim, or use a hot tub while wearing the monitor.  Keep your skin clean. Do not put body lotion or moisturizer on your chest.  Change the electrodes as told by your health care provider, or any time they stop sticking to your skin. You may need to use medical tape to keep them on.  Try to put the electrodes in slightly different places on your chest to help prevent skin irritation. Follow directions from your health care provider about where to place the electrodes.  Make sure the monitor is safely clipped to your clothing or in a location close to your body as recommended by your health care provider.  If your monitor has a symptoms button, press the button to mark an event as soon as you feel a heart-related symptom, such  as: ? Dizziness. ? Weakness. ? Light-headedness. ? Palpitations. ? Thumping or pounding in your chest. ? Shortness of breath. ? Unexplained weakness.  Keep a diary of your activities, such as walking, doing chores, and taking medicine. It is very important to note what you were doing when you pushed the button to record your symptoms. This will help your health care provider determine what might be contributing to your symptoms.  Send the recorded information as recommended by your health care provider. It may take some time for your health care provider to process the results.  Change the batteries as told by your health care provider.  Keep electronic devices away from your monitor. These include: ? Tablets. ? MP3 players. ? Cell phones.  While wearing your monitor you should avoid: ? Electric blankets. ? Armed forces operational officer. ? Electric toothbrushes. ? Microwave ovens. ? Magnets. ? Metal detectors. Get help right away if:  You have chest pain.  You have shortness of breath or extreme difficulty breathing.  You develop a very fast heartbeat that does not get better.  You develop dizziness that does not go away.  You faint or constantly feel like you are about to faint. Summary  An ambulatory cardiac monitor is a small recording device that is used to detect abnormal heart rhythms (arrhythmias).  Make sure you understand how to send the information from the monitor to your health care provider.  It is important to press the button on the monitor when you have any heart-related symptoms.  Keep a diary of your activities, such as walking, doing chores, and taking medicine. It is very important to note what you were doing when you pushed the button to record your symptoms. This will help your health care provider learn what might be causing your symptoms. This information is not intended to replace advice given to you by your health care provider. Make sure you discuss any  questions you have with your health care provider. Document Released: 06/21/2008 Document Revised: 06/28/2017 Document Reviewed: 08/27/2016 Elsevier Interactive Patient Education  2019 [employer].

## 2018-10-25 NOTE — Progress Notes (Signed)
Cardiology Consultation:    Date:  10/25/2018   ID:  Nancy Christensen, DOB Mar 09, 1951, MRN [id number]  PCP:  Billie Ruddy, MD  Cardiologist:  Jenne Campus, MD   Referring MD: Billie Ruddy, MD   Chief Complaint  Patient presents with  . Dizziness    Rare events  . Left arm Pressure  I have left arm pain as well as dizziness  History of Present Illness:    Nancy Christensen is a 68 y.o. female who is being seen today for the evaluation of left arm pain and dizziness at the request of Billie Ruddy, MD.  She recently relocated to our area for retirement overall she enjoyed living here a lot.  For a while she has been experiencing left arm pain with left neck pain.  That is not related to exercise.  In the matter-of-fact months ago she was in [place] she started having those symptoms and she ended up going to the hospital she ruled out for myocardial infarction CT of the chest was done which showed no evidence of PE after that physician cardiologist who did quite extensive evaluation which included stress test which apparently was normal.  She still complaining of having the same symptoms which is very worried about it on top of that she does have episodes of dizziness that happened when she walks sometimes when she exercise.  She never completely passed out but she felt few times like she was close to it she always had a slow heart rate when she check her heart rate sometimes it is about 50 sometimes even lower than that.  Sleep fair.  Does not have nightmares.  Denies having any palpitations.  Never smoked however comes from family of smokers.  She does have dyslipidemia but is not managed.  She exercised on the regular basis walking 3 times a week as well as going to [facility] center for exercises  Past Medical History:  Diagnosis Date  . Hyperlipidemia   . Hypertension     Past Surgical History:  Procedure Laterality Date  . ABDOMINAL HYSTERECTOMY  1997  . BREAST  SURGERY  2002   left cyst removal   . CHOLECYSTECTOMY N/A 02/12/2018   Procedure: LAPAROSCOPIC CHOLECYSTECTOMY;  Surgeon: Leighton Ruff, MD;  Location: [facility];  Service: General;  Laterality: N/A;    Current Medications: Current Meds  Medication Sig  . losartan (COZAAR) 50 MG tablet Take 50 mg by mouth daily.     Allergies:   Patient has no known allergies.   Social History   Socioeconomic History  . Marital status: Divorced    Spouse name: Not on file  . Number of children: Not on file  . Years of education: Not on file  . Highest education level: Not on file  Occupational History  . Not on file  Social Needs  . Financial resource strain: Not on file  . Food insecurity:    Worry: Not on file    Inability: Not on file  . Transportation needs:    Medical: Not on file    Non-medical: Not on file  Tobacco Use  . Smoking status: Never Smoker  . Smokeless tobacco: Never Used  Substance and Sexual Activity  . Alcohol use: Never    Frequency: Never  . Drug use: Not Currently  . Sexual activity: Not on file  Lifestyle  . Physical activity:    Days per week: Not on file    Minutes per session:  Not on file  . Stress: Not on file  Relationships  . Social connections:    Talks on phone: Not on file    Gets together: Not on file    Attends religious service: Not on file    Active member of club or organization: Not on file    Attends meetings of clubs or organizations: Not on file    Relationship status: Not on file  Other Topics Concern  . Not on file  Social History Narrative  . Not on file     Family History: The patient's family history includes Diabetes in her mother; Hypertension in her mother; Lung cancer in her father; Thyroid disease in her mother; Vascular Disease in her mother. ROS:   Please see the history of present illness.    All 14 point review of systems negative except as described per history of present illness.  EKGs/Labs/Other Studies Reviewed:     The following studies were reviewed today: Sinus bradycardia rate of 50 first-degree AV block with PR interval of 202 ms, normal QS complex duration morphology no ST-T segment changes   Recent Labs: 08/22/2018: ALT 14 10/03/2018: BUN 14; Creatinine, Ser 1.05; Hemoglobin 13.8; Platelets 212.0; Potassium 3.9; Sodium 139  Recent Lipid Panel    Component Value Date/Time   CHOL 263 (A) 01/24/2018   TRIG 143 01/24/2018   HDL 55 01/24/2018   LDLCALC 179 01/24/2018    Physical Exam:    VS:  BP 138/80   Pulse 65   Ht 5\' 4"  (1.626 m)   Wt 217 lb (98.4 kg)   SpO2 98%   BMI 37.25 kg/m     Wt Readings from Last 3 Encounters:  10/25/18 217 lb (98.4 kg)  10/12/18 217 lb (98.4 kg)  10/03/18 218 lb 2 oz (98.9 kg)     GEN:  Well nourished, well developed in no acute distress HEENT: Normal NECK: No JVD; No carotid bruits LYMPHATICS: No lymphadenopathy CARDIAC: RRR, no murmurs, no rubs, no gallops RESPIRATORY:  Clear to auscultation without rales, wheezing or rhonchi  ABDOMEN: Soft, non-tender, non-distended MUSCULOSKELETAL:  No edema; No deformity  SKIN: Warm and dry NEUROLOGIC:  Alert and oriented x 3 PSYCHIATRIC:  Normal affect   ASSESSMENT:    1. Essential hypertension   2. Dizziness   3. Sinus bradycardia   4. Atypical chest pain   5. Dyslipidemia    PLAN:    In order of problems listed above:  1. Essential hypertension: Blood pressure appears to be well controlled we will continue present management. 2. Dizziness with resting sinus bradycardia and first-degree AV block.  Obviously concern is about symptomatic significant bradycardia I will ask her to my wear 48 hours Holter monitor.  She will also be scheduled to have stress echocardiogram to look for evidence of ischemia or more importantly to look for chronotropic response.  I will not alter any of her medications right now. 3. Dyslipidemia decision regarding management will be made after stress test will be  done. 4. Atypical chest pain: Stress test done in [place] apparently negative we will get a copy of it.  I will schedule her to have another stress test in our office.   Medication Adjustments/Labs and Tests Ordered: Current medicines are reviewed at length with the patient today.  Concerns regarding medicines are outlined above.  No orders of the defined types were placed in this encounter.  No orders of the defined types were placed in this encounter.   Signed, Herbie Baltimore  Synetta Fail, MD, [facility]. 10/25/2018 2:26 PM    [facility]

## 2018-10-29 ENCOUNTER — Ambulatory Visit (INDEPENDENT_AMBULATORY_CARE_PROVIDER_SITE_OTHER): Payer: Medicare Other

## 2018-10-29 ENCOUNTER — Ambulatory Visit (HOSPITAL_BASED_OUTPATIENT_CLINIC_OR_DEPARTMENT_OTHER)
Admission: RE | Admit: 2018-10-29 | Discharge: 2018-10-29 | Disposition: A | Discharge status: Home / Self Care | Payer: Medicare Other | Source: Ambulatory Visit | Attending: Cardiology | Admitting: Cardiology

## 2018-10-29 DIAGNOSIS — R001 Bradycardia, unspecified: Secondary | ICD-10-CM | POA: Diagnosis not present

## 2018-10-29 DIAGNOSIS — I10 Essential (primary) hypertension: Secondary | ICD-10-CM | POA: Insufficient documentation

## 2018-10-29 DIAGNOSIS — R0789 Other chest pain: Secondary | ICD-10-CM | POA: Diagnosis not present

## 2018-10-29 DIAGNOSIS — R42 Dizziness and giddiness: Secondary | ICD-10-CM | POA: Diagnosis not present

## 2018-10-29 NOTE — Progress Notes (Signed)
  Echocardiogram 2D Echocardiogram has been performed.  Nancy Christensen [name] 10/29/2018, 9:25 AM

## 2018-11-23 DIAGNOSIS — H10811 Pingueculitis, right eye: Secondary | ICD-10-CM | POA: Diagnosis not present

## 2018-11-30 DIAGNOSIS — R600 Localized edema: Secondary | ICD-10-CM | POA: Diagnosis not present

## 2018-11-30 DIAGNOSIS — I87323 Chronic venous hypertension (idiopathic) with inflammation of bilateral lower extremity: Secondary | ICD-10-CM | POA: Diagnosis not present

## 2018-12-04 ENCOUNTER — Ambulatory Visit (INDEPENDENT_AMBULATORY_CARE_PROVIDER_SITE_OTHER): Payer: Medicare Other | Admitting: Cardiology

## 2018-12-04 ENCOUNTER — Encounter: Payer: Self-pay | Admitting: Cardiology

## 2018-12-04 VITALS — BP 142/84 | HR 67 | Ht 64.0 in | Wt 222.8 lb

## 2018-12-04 DIAGNOSIS — R001 Bradycardia, unspecified: Secondary | ICD-10-CM | POA: Diagnosis not present

## 2018-12-04 DIAGNOSIS — I10 Essential (primary) hypertension: Secondary | ICD-10-CM

## 2018-12-04 DIAGNOSIS — E785 Hyperlipidemia, unspecified: Secondary | ICD-10-CM | POA: Diagnosis not present

## 2018-12-04 DIAGNOSIS — R0789 Other chest pain: Secondary | ICD-10-CM | POA: Diagnosis not present

## 2018-12-04 NOTE — Progress Notes (Signed)
Cardiology Office Note:    Date:  12/04/2018   ID:  Nancy Christensen, DOB 12/04/1950, MRN [id number]  PCP:  Billie Ruddy, MD  Cardiologist:  Jenne Campus, MD    Referring MD: Billie Ruddy, MD   Chief Complaint  Patient presents with  . 1 month follow up  Doing well  History of Present Illness:    Nancy Christensen is a 68 y.o. female was sent to my office originally because of bradycardia we did monitor and she did have some bradycardia mechanism of this was sinus.  It was not critical she is asymptomatic she walks on the regular basis she is enjoying it and she is trying to ramp up her exercises denies have any chest pain tightness squeezing pressure burning chest overall doing very well no dizziness no passing out no nightmares  Past Medical History:  Diagnosis Date  . Hyperlipidemia   . Hypertension     Past Surgical History:  Procedure Laterality Date  . ABDOMINAL HYSTERECTOMY  1997  . BREAST SURGERY  2002   left cyst removal   . CHOLECYSTECTOMY N/A 02/12/2018   Procedure: LAPAROSCOPIC CHOLECYSTECTOMY;  Surgeon: Leighton Ruff, MD;  Location: [facility];  Service: General;  Laterality: N/A;    Current Medications: Current Meds  Medication Sig  . losartan (COZAAR) 50 MG tablet Take 50 mg by mouth daily.     Allergies:   Patient has no known allergies.   Social History   Socioeconomic History  . Marital status: Divorced    Spouse name: Not on file  . Number of children: Not on file  . Years of education: Not on file  . Highest education level: Not on file  Occupational History  . Not on file  Social Needs  . Financial resource strain: Not on file  . Food insecurity:    Worry: Not on file    Inability: Not on file  . Transportation needs:    Medical: Not on file    Non-medical: Not on file  Tobacco Use  . Smoking status: Never Smoker  . Smokeless tobacco: Never Used  Substance and Sexual Activity  . Alcohol use: Never    Frequency: Never    . Drug use: Not Currently  . Sexual activity: Not on file  Lifestyle  . Physical activity:    Days per week: Not on file    Minutes per session: Not on file  . Stress: Not on file  Relationships  . Social connections:    Talks on phone: Not on file    Gets together: Not on file    Attends religious service: Not on file    Active member of club or organization: Not on file    Attends meetings of clubs or organizations: Not on file    Relationship status: Not on file  Other Topics Concern  . Not on file  Social History Narrative  . Not on file     Family History: The patient's family history includes Diabetes in her mother; Hypertension in her mother; Lung cancer in her father; Thyroid disease in her mother; Vascular Disease in her mother. ROS:   Please see the history of present illness.    All 14 point review of systems negative except as described per history of present illness  EKGs/Labs/Other Studies Reviewed:      Recent Labs: 08/22/2018: ALT 14 10/03/2018: BUN 14; Creatinine, Ser 1.05; Hemoglobin 13.8; Platelets 212.0; Potassium 3.9; Sodium 139  Recent Lipid Panel  Component Value Date/Time   CHOL 263 (A) 01/24/2018   TRIG 143 01/24/2018   HDL 55 01/24/2018   LDLCALC 179 01/24/2018    Physical Exam:    VS:  BP (!) 142/84   Pulse 67   Ht 5\' 4"  (1.626 m)   Wt 222 lb 12.8 oz (101.1 kg)   SpO2 98%   BMI 38.24 kg/m     Wt Readings from Last 3 Encounters:  12/04/18 222 lb 12.8 oz (101.1 kg)  10/25/18 217 lb (98.4 kg)  10/12/18 217 lb (98.4 kg)     GEN:  Well nourished, well developed in no acute distress HEENT: Normal NECK: No JVD; No carotid bruits LYMPHATICS: No lymphadenopathy CARDIAC: RRR, no murmurs, no rubs, no gallops RESPIRATORY:  Clear to auscultation without rales, wheezing or rhonchi  ABDOMEN: Soft, non-tender, non-distended MUSCULOSKELETAL:  No edema; No deformity  SKIN: Warm and dry LOWER EXTREMITIES: no swelling NEUROLOGIC:  Alert  and oriented x 3 PSYCHIATRIC:  Normal affect   ASSESSMENT:    1. Dyslipidemia   2. Sinus bradycardia   3. Essential hypertension   4. Atypical chest pain    PLAN:    In order of problems listed above:  1. Dyslipidemia we will recheck her fasting lipid profile today 2. Sinus bradycardia not critical we will continue present management including exercises on the regular basis 3. Essential hypertension she check her blood pressure at home and she brought results to me it is excellent we will continue present management 4. Atypical chest pain denies having headache, stress test negative   Medication Adjustments/Labs and Tests Ordered: Current medicines are reviewed at length with the patient today.  Concerns regarding medicines are outlined above.  Orders Placed This Encounter  Procedures  . Lipid Profile   Medication changes: No orders of the defined types were placed in this encounter.   Signed, Park Liter, MD, [facility] 12/04/2018 12:38 PM    [place]

## 2018-12-04 NOTE — Patient Instructions (Signed)
Medication Instructions:  Your physician recommends that you continue on your current medications as directed. Please refer to the Current Medication list given to you today.  If you need a refill on your cardiac medications before your next appointment, please call your pharmacy.   Lab work: Your physician recommends that you return for lab work today: lipid    If you have labs (blood work) drawn today and your tests are completely normal, you will receive your results only by: Marland Kitchen MyChart Message (if you have MyChart) OR . A paper copy in the mail If you have any lab test that is abnormal or we need to change your treatment, we will call you to review the results.  Testing/Procedures: None.   Follow-Up: At [facility], you and your health needs are our priority.  As part of our continuing mission to provide you with exceptional heart care, we have created designated Provider Care Teams.  These Care Teams include your primary Cardiologist (physician) and Advanced Practice Providers (APPs -  Physician Assistants and Nurse Practitioners) who all work together to provide you with the care you need, when you need it. You will need a follow up appointment in 5 months.  Please call our office 2 months in advance to schedule this appointment.  You may see No primary care provider on file. or another member of our [employer] Provider Team in [place]: Shirlee More, MD . Jyl Heinz, MD  Any Other Special Instructions Will Be Listed Below (If Applicable).

## 2018-12-05 LAB — LIPID PANEL
CHOLESTEROL TOTAL: 272 mg/dL — AB (ref 100–199)
Chol/HDL Ratio: 4.9 ratio — ABNORMAL HIGH (ref 0.0–4.4)
HDL: 56 mg/dL (ref >39)
LDL CALC: 184 mg/dL — AB (ref 0–99)
TRIGLYCERIDES: 159 mg/dL — AB (ref 0–149)
VLDL Cholesterol Cal: 32 mg/dL (ref 5–40)

## 2018-12-07 ENCOUNTER — Telehealth: Payer: Self-pay | Admitting: Emergency Medicine

## 2018-12-07 DIAGNOSIS — E785 Hyperlipidemia, unspecified: Secondary | ICD-10-CM

## 2018-12-07 MED ORDER — ROSUVASTATIN CALCIUM 10 MG PO TABS: 10.0000 mg | ORAL_TABLET | Freq: Every day | ORAL | 1 refills | Status: DC

## 2018-12-07 NOTE — Telephone Encounter (Signed)
Patient informed of lab results and advised to start crestor 10 mg daily, patient also advised to return in 6 weeks for fasting blood work she verbally understands.

## 2018-12-14 ENCOUNTER — Telehealth: Payer: Self-pay

## 2018-12-14 NOTE — Telephone Encounter (Signed)
Pt asking for referral to nutritionist.

## 2018-12-17 ENCOUNTER — Other Ambulatory Visit: Payer: Self-pay | Admitting: Family Medicine

## 2018-12-17 DIAGNOSIS — Z719 Counseling, unspecified: Secondary | ICD-10-CM

## 2018-12-17 NOTE — Telephone Encounter (Signed)
Referral has been placed. 

## 2018-12-24 ENCOUNTER — Encounter: Payer: Self-pay | Admitting: Family Medicine

## 2018-12-24 DIAGNOSIS — K649 Unspecified hemorrhoids: Secondary | ICD-10-CM

## 2019-01-07 ENCOUNTER — Encounter: Payer: Self-pay | Admitting: Gastroenterology

## 2019-01-07 ENCOUNTER — Ambulatory Visit (INDEPENDENT_AMBULATORY_CARE_PROVIDER_SITE_OTHER): Payer: Medicare Other | Admitting: Gastroenterology

## 2019-01-07 ENCOUNTER — Other Ambulatory Visit: Payer: Self-pay

## 2019-01-07 VITALS — BP 117/76 | Ht 64.0 in | Wt 220.0 lb

## 2019-01-07 DIAGNOSIS — K921 Melena: Secondary | ICD-10-CM

## 2019-01-07 NOTE — Progress Notes (Signed)
Patient scheduled for telehealth visit today. I have tried to reach her via telephone and Zoom link multiple times over the last 5 hours. I left a message on her voicemail asking her to call to reschedule her appointment.

## 2019-01-09 ENCOUNTER — Encounter: Payer: Self-pay | Admitting: Gastroenterology

## 2019-01-09 ENCOUNTER — Ambulatory Visit (INDEPENDENT_AMBULATORY_CARE_PROVIDER_SITE_OTHER): Payer: Medicare Other | Admitting: Gastroenterology

## 2019-01-09 ENCOUNTER — Other Ambulatory Visit: Payer: Self-pay

## 2019-01-09 DIAGNOSIS — K625 Hemorrhage of anus and rectum: Secondary | ICD-10-CM | POA: Diagnosis not present

## 2019-01-09 NOTE — Progress Notes (Signed)
TELEHEALTH VISIT  Referring Provider: Billie Ruddy, MD Primary Care Physician:  Billie Ruddy, MD   Tele-visit due to COVID-19 pandemic Patient requested visit virtually, consented to the virtual encounter via video enabled telemedicine application, and then converted to telephone at the patient's request Contact made at: 9:31 01/09/19 Patient verified by name and date of birth Location of patient: Home Location provider: [facility] Names of persons participating: Me, patient, Tinnie Gens CMA Time spent on telehealth visit: 28 minutes I discussed the limitations of evaluation and management by telemedicine. The patient expressed understanding and agreed to proceed.  Reason for Consultation: Blood in the stool   IMPRESSION:  Intermittent rectal bleeding with pain, "walnut" at the rectum, and malodor over the last 30 days Patient reported diagnosis of hemorrhoids History of colon polyps removed in [place] 6-7 years ago    - surveillance recommended in 5-10 years Cholecystectomy 02/12/18 for cholecystitis and cholelithiasis Paternal uncle with colon cancer (no other known family history of colon cancer or polyps)  The differential for rectal bleeding is broad. Of concern is the feeling of a walnut at her rectum and the associated malodor. The differential includes outlet sources such as fissure or hemorrhoids, as well as polyps, mass, ulcers, and colitis.  Given this differential I am recommending a colonoscopy. Given the coronavirus, she is anxious to have an endoscopy at this time. She has a history of bronchitis and it afraid that the risks of exposure outweigh the risks of an undiagnosed polyp or mass.    PLAN: Daily stool bulking agent recommended such as Metamucil, Benefiber, or Citrucel Obtain results from Colonoscopy procedure note and path results from [facility] in [place]  Colonoscopy  I consented the patient discussing the risks,  benefits, and alternatives to endoscopic evaluation. In particular, we discussed the risks that include, but are not limited to, reaction to medication, cardiopulmonary compromise, bleeding requiring blood transfusion, aspiration resulting in pneumonia, perforation requiring surgery, lack of diagnosis, severe illness requiring hospitalization, and even death. We reviewed the risk of missed lesion including polyps or even cancer. The patient acknowledges these risks and asks that we proceed.   HPI: Nancy Christensen is a 68 y.o. 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  Referred by Dr. Volanda Napoleon for further evaluation of blood in the stool. The history is obtained through the patient and review of her electronic health record. She has hyperlipidemia, hypertension, cholecystectomy 02/12/18 for cholecystitis and cholelithiasis, and sinus bradycardia.   Noted bright red blood on 3 occasions within the last month. Some straining. Associated rectal pain and constant pressure with the need to defecate. Initially felt raw as if there could be a tear.  Odor of "dead blood"  noted on the third episode and persisted. Noted what she thought were large hemorrhoids that were the size of a walnut with the first two episodes of bleeding. With witch hazel the size has decreased. No other associated symptoms. No identified exacerbating or relieving features. No other associated symptoms. No identified exacerbating or relieving features.   History of hemorrhoids with associated pain, itching, and rare scant bleeding.   Has been taking OxBile following her cholecystectomy to keep her bowel habits regulated so that she does not become constipated.  Labs from 10/03/18 show a hemoglobin of 13.8, MCV 85.8, RDW 14.5.   Colonoscopy at least 6-7 years ago in [place], [place]. Two small polyps were removed.  Surveillance colonoscopy recommended within 5-10 years.  These results  are not available to me at this time.  No  prior abdominal imaging.  Paternal ulcer with colon cancer at age 36. No other known family history of colon cancer or polyps. No family history of uterine/endometrial cancer, pancreatic cancer or gastric/stomach cancer.  Past Medical History:  Diagnosis Date  . Hyperlipidemia   . Hypertension     Past Surgical History:  Procedure Laterality Date  . ABDOMINAL HYSTERECTOMY  1997  . BREAST SURGERY  2002   left cyst removal   . CHOLECYSTECTOMY N/A 02/12/2018   Procedure: LAPAROSCOPIC CHOLECYSTECTOMY;  Surgeon: Leighton Ruff, MD;  Location: [facility];  Service: General;  Laterality: N/A;    Current Outpatient Medications  Medication Sig Dispense Refill  . BILE SALTS-CAPS-CASC-PHENOLPTH PO Take by mouth 3 (three) times daily. Once after each meal    . cholecalciferol (VITAMIN D3) 25 MCG (1000 UT) tablet Take 1,000 Units by mouth daily.    Marland Kitchen diltiazem (CARDIZEM CD) 240 MG 24 hr capsule Take 240 mg by mouth daily.    . vitamin B-12 (CYANOCOBALAMIN) 1000 MCG tablet Take 1,000 mcg by mouth daily.    . vitamin C (ASCORBIC ACID) 500 MG tablet Take 500 mg by mouth daily.     No current facility-administered medications for this visit.     Allergies as of 01/09/2019  . (No Known Allergies)    Family History  Problem Relation Age of Onset  . Diabetes Mother   . Hypertension Mother   . Thyroid disease Mother   . Vascular Disease Mother   . Lung cancer Father     Social History   Socioeconomic History  . Marital status: Divorced    Spouse name: Not on file  . Number of children: Not on file  . Years of education: Not on file  . Highest education level: Not on file  Occupational History  . Not on file  Social Needs  . Financial resource strain: Not on file  . Food insecurity:    Worry: Not on file    Inability: Not on file  . Transportation needs:    Medical: Not on file    Non-medical: Not on file  Tobacco Use  . Smoking status: Never Smoker  . Smokeless tobacco: Never  Used  Substance and Sexual Activity  . Alcohol use: Never    Frequency: Never  . Drug use: Not Currently  . Sexual activity: Not on file  Lifestyle  . Physical activity:    Days per week: Not on file    Minutes per session: Not on file  . Stress: Not on file  Relationships  . Social connections:    Talks on phone: Not on file    Gets together: Not on file    Attends religious service: Not on file    Active member of club or organization: Not on file    Attends meetings of clubs or organizations: Not on file    Relationship status: Not on file  . Intimate partner violence:    Fear of current or ex partner: Not on file    Emotionally abused: Not on file    Physically abused: Not on file    Forced sexual activity: Not on file  Other Topics Concern  . Not on file  Social History Narrative  . Not on file    Review of Systems: ALL ROS discussed and all others negative except listed in HPI.  Physical Exam: General: in no acute distress Neuro: Alert and appropriate Psych:  Normal affect and normal insight Exam limited due to telehealth technology.   Sasha Rogel L. Tarri Glenn, MD, MPH [facility] 01/09/2019, 9:38 AM

## 2019-01-09 NOTE — Patient Instructions (Addendum)
Daily stool bulking agent recommended such as Metamucil, Benefiber, or Citrucel.   Colonoscopy recommended. We will proceed when you are ready. If we have not heard from you by then, we will contact you when the [place] restrictions have been lifted. However, please call with any questions or concerns in the meantime. Although your bleeding may be related to hemorrhoids, there are other possibilities including an anal fissure, polyp, or mass. These diagnoses would all be treated differently.   Obtain results from Colonoscopy procedure note and path results from [facility] in [place] you for your patience with me and our technology today! Please stay home, safe, and healthy. I look forward to meeting you in person in the future.

## 2019-01-14 NOTE — Addendum Note (Signed)
Addended by: Donell Beers on: 01/14/2019 09:39 PM   Modules accepted: Level of Service

## 2019-01-29 DIAGNOSIS — I8312 Varicose veins of left lower extremity with inflammation: Secondary | ICD-10-CM | POA: Diagnosis not present

## 2019-01-29 DIAGNOSIS — I8311 Varicose veins of right lower extremity with inflammation: Secondary | ICD-10-CM | POA: Diagnosis not present

## 2019-03-06 ENCOUNTER — Ambulatory Visit: Payer: Medicare Other | Admitting: Internal Medicine

## 2019-03-07 ENCOUNTER — Encounter: Payer: Self-pay | Admitting: Family Medicine

## 2019-03-14 ENCOUNTER — Encounter: Payer: Self-pay | Admitting: Family Medicine

## 2019-03-14 ENCOUNTER — Ambulatory Visit (INDEPENDENT_AMBULATORY_CARE_PROVIDER_SITE_OTHER): Payer: Medicare Other

## 2019-03-14 ENCOUNTER — Other Ambulatory Visit: Payer: Self-pay

## 2019-03-14 ENCOUNTER — Other Ambulatory Visit: Payer: Medicare Other

## 2019-03-14 ENCOUNTER — Ambulatory Visit (INDEPENDENT_AMBULATORY_CARE_PROVIDER_SITE_OTHER): Payer: Medicare Other | Admitting: Family Medicine

## 2019-03-14 DIAGNOSIS — I83893 Varicose veins of bilateral lower extremities with other complications: Secondary | ICD-10-CM

## 2019-03-14 DIAGNOSIS — M1612 Unilateral primary osteoarthritis, left hip: Secondary | ICD-10-CM | POA: Diagnosis not present

## 2019-03-14 DIAGNOSIS — M25552 Pain in left hip: Secondary | ICD-10-CM

## 2019-03-14 NOTE — Progress Notes (Signed)
Virtual Visit via Video Note  I connected with Nancy Christensen on 03/14/19 at 10:00 AM EDT by a video enabled telemedicine application and verified that I am speaking with the correct person using two identifiers.  Location patient: home Location provider:work or home office Persons participating in the virtual visit: patient, provider  I discussed the limitations of evaluation and management by telemedicine and the availability of in person appointments. The patient expressed understanding and agreed to proceed.   HPI: Problem with L hip.  Pain in L hip since walking more.  Feels like a pulled a groin muscle, other times feels like "it gives out".  Walking on her toes more to reduce the discomfort.  Constant pain.  The pain is worse than a spasm, more like a "quick grip".  Denies numbness, erythema, edema, or recent falls. Occasionally will have griping pain in lateral L calf at night or in the afternoon.  Now having low R sided back pain/underneath rib cage.  Tried voltaern gel on hip which helped some.  Varicose veins:  Went to [facility] on [address] near [employer].  Was advised had "tortuous veins and pooling of blood causing discoloration in ankles".  Pt inquires about this providers thoughts on procedures for varicose veins.  ROS: See pertinent positives and negatives per HPI.  Past Medical History:  Diagnosis Date  . Hyperlipidemia   . Hypertension     Past Surgical History:  Procedure Laterality Date  . ABDOMINAL HYSTERECTOMY  1997  . BREAST SURGERY  2002   left cyst removal   . CHOLECYSTECTOMY N/A 02/12/2018   Procedure: LAPAROSCOPIC CHOLECYSTECTOMY;  Surgeon: Leighton Ruff, MD;  Location: [facility];  Service: General;  Laterality: N/A;    Family History  Problem Relation Age of Onset  . Diabetes Mother   . Hypertension Mother   . Thyroid disease Mother   . Vascular Disease Mother   . Lung cancer Father      Current Outpatient Medications:  .   BILE SALTS-CAPS-CASC-PHENOLPTH PO, Take by mouth 3 (three) times daily. Once after each meal, Disp: , Rfl:  .  cholecalciferol (VITAMIN D3) 25 MCG (1000 UT) tablet, Take 1,000 Units by mouth daily., Disp: , Rfl:  .  diltiazem (CARDIZEM CD) 240 MG 24 hr capsule, Take 240 mg by mouth daily., Disp: , Rfl:  .  vitamin B-12 (CYANOCOBALAMIN) 1000 MCG tablet, Take 1,000 mcg by mouth daily., Disp: , Rfl:  .  vitamin C (ASCORBIC ACID) 500 MG tablet, Take 500 mg by mouth daily., Disp: , Rfl:   EXAM:  VITALS per patient if applicable:  RR between 12-20 bpm  GENERAL: alert, oriented, appears well and in no acute distress  HEENT: atraumatic, conjunctiva clear, no obvious abnormalities on inspection of external nose and ears  NECK: normal movements of the head and neck  LUNGS: on inspection no signs of respiratory distress, breathing rate appears normal, no obvious gross SOB, gasping or wheezing  CV: no obvious cyanosis  MS: moves all visible extremities without noticeable abnormality  PSYCH/NEURO: pleasant and cooperative, no obvious depression or anxiety, speech and thought processing grossly intact  ASSESSMENT AND PLAN:  Discussed the following assessment and plan:  Left hip pain  -discussed various causes including muscle strain, arthritis, etc. -discussed heat, massage, stretching, Tylenol, rest - Plan: DG Hip Unilat W OR W/O Pelvis Min 4 Views Left.  Will come by the clinic tomorrow am for imaging.  Varicose veins of bilateral lower extremities with other complications  -  discussed various treatment options -advised to consider TED hose and elevation of LEs when able  F/u prn   I discussed the assessment and treatment plan with the patient. The patient was provided an opportunity to ask questions and all were answered. The patient agreed with the plan and demonstrated an understanding of the instructions.   The patient was advised to call back or seek an in-person evaluation if the  symptoms worsen or if the condition fails to improve as anticipated.   Billie Ruddy, MD

## 2019-03-15 ENCOUNTER — Other Ambulatory Visit: Payer: Medicare Other

## 2019-03-19 ENCOUNTER — Telehealth: Payer: Self-pay | Admitting: Family Medicine

## 2019-03-19 NOTE — Telephone Encounter (Signed)
Pt had sent in a mychart msg stating that she would like to have a referral to a Ortho.  She stated that she had come in and did a xray on 03/14/2019 and had received a msg and she was trying to reply back and not make a virtual appointment.

## 2019-03-20 NOTE — Telephone Encounter (Signed)
Ok for referral?

## 2019-03-21 ENCOUNTER — Other Ambulatory Visit: Payer: Self-pay

## 2019-03-21 NOTE — Telephone Encounter (Signed)
ok 

## 2019-03-25 ENCOUNTER — Other Ambulatory Visit: Payer: Self-pay

## 2019-03-25 DIAGNOSIS — M25552 Pain in left hip: Secondary | ICD-10-CM

## 2019-03-25 NOTE — Telephone Encounter (Signed)
Referral placed per dr Volanda Napoleon

## 2019-04-01 ENCOUNTER — Encounter: Payer: Medicare Other | Admitting: Family Medicine

## 2019-04-11 ENCOUNTER — Ambulatory Visit: Payer: Medicare Other | Admitting: Internal Medicine

## 2019-04-18 ENCOUNTER — Ambulatory Visit: Payer: Medicare Other | Admitting: Orthopaedic Surgery

## 2019-05-13 ENCOUNTER — Telehealth: Payer: Self-pay | Admitting: Family Medicine

## 2019-05-13 ENCOUNTER — Other Ambulatory Visit: Payer: Self-pay | Admitting: Diagnostic Radiology

## 2019-05-13 DIAGNOSIS — Z1231 Encounter for screening mammogram for malignant neoplasm of breast: Secondary | ICD-10-CM

## 2019-05-13 NOTE — Telephone Encounter (Signed)
Called patient upon appt request/lvm for pt to call and make an appt//tes

## 2019-05-15 ENCOUNTER — Other Ambulatory Visit: Payer: Self-pay

## 2019-05-15 ENCOUNTER — Encounter: Payer: Medicare Other | Admitting: Family Medicine

## 2019-05-15 ENCOUNTER — Encounter: Payer: Self-pay | Admitting: Family Medicine

## 2019-05-15 ENCOUNTER — Ambulatory Visit (INDEPENDENT_AMBULATORY_CARE_PROVIDER_SITE_OTHER): Payer: Medicare Other | Admitting: Family Medicine

## 2019-05-15 VITALS — BP 118/74 | HR 52 | Temp 98.7°F | Wt 202.0 lb

## 2019-05-15 DIAGNOSIS — B351 Tinea unguium: Secondary | ICD-10-CM

## 2019-05-15 DIAGNOSIS — R922 Inconclusive mammogram: Secondary | ICD-10-CM | POA: Diagnosis not present

## 2019-05-15 DIAGNOSIS — L600 Ingrowing nail: Secondary | ICD-10-CM | POA: Diagnosis not present

## 2019-05-15 NOTE — Progress Notes (Signed)
Subjective:    Patient ID: Nancy Christensen, female    DOB: 11-03-1950, 68 y.o.   MRN: [id number]  No chief complaint on file.   HPI Patient was seen today for ongoing concern.  Patient endorses right great toe pain x 3 wks..  Patient notes pain and mild discomfort to the lateral edge of right great toenail.  Notices more with increased activity.  States side of toenail may have been cut when she got a pedicure several months ago.  Also notes onychomycosis of bilateral second toenails.  Present times while.  Patient used to be an avid runner.  Pt also ask if a paper can be faxed to her insurance company regarding getting her colonoscopy.  Pt also asking for a referral for 3D mammogram.  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  Past Medical History:  Diagnosis Date  . Hyperlipidemia   . Hypertension     No Known Allergies  ROS General: Denies fever, chills, night sweats, changes in weight, changes in appetite HEENT: Denies headaches, ear pain, changes in vision, rhinorrhea, sore throat CV: Denies CP, palpitations, SOB, orthopnea Pulm: Denies SOB, cough, wheezing GI: Denies abdominal pain, nausea, vomiting, diarrhea, constipation GU: Denies dysuria, hematuria, frequency, vaginal discharge Msk: Denies muscle cramps, joint pains  +R great toe pain Neuro: Denies weakness, numbness, tingling Skin: Denies rashes, bruising  + onychomycosis Psych: Denies depression, anxiety, hallucinations      Objective:    Blood pressure 118/74, pulse (!) 52, temperature 98.7 F (37.1 C), temperature source Temporal, weight 202 lb (91.6 kg), SpO2 98 %.   Gen. Pleasant, well-nourished, in no distress, normal affect   HEENT: [place]/AT, face symmetric, no scleral icterus, PERRLA, nares patent without drainage Cardiovascular: RRR, no peripheral edema. Musculoskeletal: No deformities, no cyanosis or clubbing, normal tone Neuro:  A&Ox3, CN II-XII intact, normal gait Skin:  Warm, no lesions/ rash.  B/l 2nd  toes with dark, thickened nails.  Lateral R great toes with mild TTP, slightly darkened area near    Wt Readings from Last 3 Encounters:  05/15/19 202 lb (91.6 kg)  01/07/19 220 lb (99.8 kg)  12/04/18 222 lb 12.8 oz (101.1 kg)    Lab Results  Component Value Date   WBC 7.1 10/03/2018   HGB 13.8 10/03/2018   HCT 41.1 10/03/2018   PLT 212.0 10/03/2018   GLUCOSE 84 10/03/2018   CHOL 272 (H) 12/04/2018   TRIG 159 (H) 12/04/2018   HDL 56 12/04/2018   LDLCALC 184 (H) 12/04/2018   ALT 14 08/22/2018   AST 18 08/22/2018   NA 139 10/03/2018   K 3.9 10/03/2018   CL 105 10/03/2018   CREATININE 1.05 10/03/2018   BUN 14 10/03/2018   CO2 28 10/03/2018    Assessment/Plan:  Ingrown toenail of right foot  -given handout - Plan: Ambulatory referral to Podiatry  Onychomycosis  -discussed various treatment options.  Discussed duration of treatment options. - Plan: Ambulatory referral to Podiatry  Breast density  - Plan: MM Digital Diagnostic Bilat  F/u prn  Grier Mitts, MD

## 2019-05-15 NOTE — Patient Instructions (Signed)
Ingrown Toenail An ingrown toenail occurs when the corner or sides of a toenail grow into the surrounding skin. This causes discomfort and pain. The big toe is most commonly affected, but any of the toes can be affected. If an ingrown toenail is not treated, it can become infected. What are the causes? This condition may be caused by:  Wearing shoes that are too small or tight.  An injury, such as stubbing your toe or having your toe stepped on.  Improper cutting or care of your toenails.  Having nail or foot abnormalities that were present from birth (congenital abnormalities), such as having a nail that is too big for your toe. What increases the risk? The following factors may make you more likely to develop ingrown toenails:  Age. Nails tend to get thicker with age, so ingrown nails are more common among older people.  Cutting your toenails incorrectly, such as cutting them very short or cutting them unevenly. An ingrown toenail is more likely to get infected if you have:  Diabetes.  Blood flow (circulation) problems. What are the signs or symptoms? Symptoms of an ingrown toenail may include:  Pain, soreness, or tenderness.  Redness.  Swelling.  Hardening of the skin that surrounds the toenail. Signs that an ingrown toenail may be infected include:  Fluid or pus.  Symptoms that get worse instead of better. How is this diagnosed? An ingrown toenail may be diagnosed based on your medical history, your symptoms, and a physical exam. If you have fluid or blood coming from your toenail, a sample may be collected to test for the specific type of bacteria that is causing the infection. How is this treated? Treatment depends on how severe your ingrown toenail is. You may be able to care for your toenail at home.  If you have an infection, you may be prescribed antibiotic medicines.  If you have fluid or pus draining from your toenail, your health care provider may drain it.   If you have trouble walking, you may be given crutches to use.  If you have a severe or infected ingrown toenail, you may need a procedure to remove part or all of the nail. Follow these instructions at home: Foot care   Do not pick at your toenail or try to remove it yourself.  Soak your foot in warm, soapy water. Do this for 20 minutes, 3 times a day, or as often as told by your health care provider. This helps to keep your toe clean and keep your skin soft.  Wear shoes that fit well and are not too tight. Your health care provider may recommend that you wear open-toed shoes while you heal.  Trim your toenails regularly and carefully. Cut your toenails straight across to prevent injury to the skin at the corners of the toenail. Do not cut your nails in a curved shape.  Keep your feet clean and dry to help prevent infection. Medicines  Take over-the-counter and prescription medicines only as told by your health care provider.  If you were prescribed an antibiotic, take it as told by your health care provider. Do not stop taking the antibiotic even if you start to feel better. Activity  Return to your normal activities as told by your health care provider. Ask your health care provider what activities are safe for you.  Avoid activities that cause pain. General instructions  If your health care provider told you to use crutches to help you move around, use them   as instructed.  Keep all follow-up visits as told by your health care provider. This is important. Contact a health care provider if:  You have more redness, swelling, pain, or other symptoms that do not improve with treatment.  You have fluid, blood, or pus coming from your toenail. Get help right away if:  You have a red streak on your skin that starts at your foot and spreads up your leg.  You have a fever. Summary  An ingrown toenail occurs when the corner or sides of a toenail grow into the surrounding skin.  This causes discomfort and pain. The big toe is most commonly affected, but any of the toes can be affected.  If an ingrown toenail is not treated, it can become infected.  Fluid or pus draining from your toenail is a sign of infection. Your health care provider may need to drain it. You may be given antibiotics to treat the infection.  Trimming your toenails regularly and properly can help you prevent an ingrown toenail. This information is not intended to replace advice given to you by your health care provider. Make sure you discuss any questions you have with your health care provider. Document Released: 09/09/2000 Document Revised: 01/04/2019 Document Reviewed: 05/31/2017 Elsevier Patient Education  2020 [place].  Fungal Nail Infection A fungal nail infection is a common infection of the toenails or fingernails. This condition affects toenails more often than fingernails. It often affects the great, or big, toes. More than one nail may be infected. The condition can be passed from person to person (is contagious). What are the causes? This condition is caused by a fungus. Several types of fungi can cause the infection. These fungi are common in moist and warm areas. If your hands or feet come into contact with the fungus, it may get into a crack in your fingernail or toenail and cause the infection. What increases the risk? The following factors may make you more likely to develop this condition:  Being female.  Being of older age.  Living with someone who has the fungus.  Walking barefoot in areas where the fungus thrives, such as showers or locker rooms.  Wearing shoes and socks that cause your feet to sweat.  Having a nail injury or a recent nail surgery.  Having certain medical conditions, such as: ? Athlete's foot. ? Diabetes. ? Psoriasis. ? Poor circulation. ? A weak body defense system (immune system). What are the signs or symptoms? Symptoms of this condition  include:  A pale spot on the nail.  Thickening of the nail.  A nail that becomes yellow or brown.  A brittle or ragged nail edge.  A crumbling nail.  A nail that has lifted away from the nail bed. How is this diagnosed? This condition is diagnosed with a physical exam. Your health care provider may take a scraping or clipping from your nail to test for the fungus. How is this treated? Treatment is not needed for mild infections. If you have significant nail changes, treatment may include:  Antifungal medicines taken by mouth (orally). You may need to take the medicine for several weeks or several months, and you may not see the results for a long time. These medicines can cause side effects. Ask your health care provider what problems to watch for.  Antifungal nail polish or nail cream. These may be used along with oral antifungal medicines.  Laser treatment of the nail.  Surgery to remove the nail. This may  be needed for the most severe infections. It can take a long time, usually up to a year, for the infection to go away. The infection may also come back. Follow these instructions at home: Medicines  Take or apply over-the-counter and prescription medicines only as told by your health care provider.  Ask your health care provider about using over-the-counter mentholated ointment on your nails. Nail care  Trim your nails often.  Wash and dry your hands and feet every day.  Keep your feet dry: ? Wear absorbent socks, and change your socks frequently. ? Wear shoes that allow air to circulate, such as sandals or canvas tennis shoes. Throw out old shoes.  Do not use artificial nails.  If you go to a nail salon, make sure you choose one that uses clean instruments.  Use antifungal foot powder on your feet and in your shoes. General instructions  Do not share personal items, such as towels or nail clippers.  Do not walk barefoot in shower rooms or locker rooms.  Wear  rubber gloves if you are working with your hands in wet areas.  Keep all follow-up visits as told by your health care provider. This is important. Contact a health care provider if: Your infection is not getting better or it is getting worse after several months. Summary  A fungal nail infection is a common infection of the toenails or fingernails.  Treatment is not needed for mild infections. If you have significant nail changes, treatment may include taking medicine orally and applying medicine to your nails.  It can take a long time, usually up to a year, for the infection to go away. The infection may also come back.  Take or apply over-the-counter and prescription medicines only as told by your health care provider.  Follow instructions for taking care of your nails to help prevent infection from coming back or spreading. This information is not intended to replace advice given to you by your health care provider. Make sure you discuss any questions you have with your health care provider. Document Released: 09/09/2000 Document Revised: 01/03/2019 Document Reviewed: 02/16/2018 Elsevier Patient Education  2020 [employer].

## 2019-05-17 ENCOUNTER — Other Ambulatory Visit: Payer: Self-pay | Admitting: Family Medicine

## 2019-05-17 NOTE — Telephone Encounter (Signed)
Medication: diltiazem (CARDIZEM CD) 240 MG 24 hr capsule [id number]   Has the patient contacted their pharmacy? Yes  (Agent: If no, request that the patient contact the pharmacy for the refill.) (Agent: If yes, when and what did the pharmacy advise?)  Preferred Pharmacy (with phone number or street name): [id number] [address], [place] [phone] (Phone) [phone] (Fax)    Agent: Please be advised that RX refills may take up to 3 business days. We ask that you follow-up with your pharmacy.

## 2019-05-17 NOTE — Telephone Encounter (Signed)
Requested medication (s) are due for refill today: yes  Requested medication (s) are on the active medication list: no  Last refill:  Last refilled by historical provider  Future visit scheduled: Yes  Notes to clinic:  Unable to refill per protocol. Medication last filled by historical provider    Requested Prescriptions  Pending Prescriptions Disp Refills   diltiazem (CARDIZEM CD) 240 MG 24 hr capsule       Sig: Take 1 capsule (240 mg total) by mouth daily.     Cardiovascular:  Calcium Channel Blockers Passed - 05/17/2019  3:41 PM      Passed - Last BP in normal range    BP Readings from Last 1 Encounters:  05/15/19 118/74         Passed - Valid encounter within last 6 months    Recent Outpatient Visits          2 days ago Ingrown toenail of right foot   [facility] [place], MD   2 months ago Left hip pain   [facility] [place], [place]   7 months ago [place] [facility] [place], MD   7 months ago [place] [facility] [place], Malka So, MD   8 months ago Acute right-sided thoracic back pain   [facility] [place], Malka So, MD      Future Appointments            In 1 month Volanda Napoleon, Langley Adie, MD [employer] [facility] [place], [place]   In 1 month Leandrew Koyanagi, MD [facility]   In 1 month Agustin Cree, Marily Lente, MD [facility]   In 2 months Melvyn Novas, Christena Deem, MD [facility]

## 2019-05-20 ENCOUNTER — Telehealth: Payer: Self-pay | Admitting: Gastroenterology

## 2019-05-20 MED ORDER — DILTIAZEM HCL ER COATED BEADS 240 MG PO CP24: 240.0000 mg | ORAL_CAPSULE | Freq: Every day | ORAL | 0 refills | Status: DC

## 2019-05-20 NOTE — Telephone Encounter (Signed)
Please advise 

## 2019-05-20 NOTE — Telephone Encounter (Signed)
Prefer pcp to take over on this rx but ok to refill until her next ov with pcp to discuss bp rx going forward

## 2019-05-20 NOTE — Telephone Encounter (Signed)
Dr. Tarri Glenn, pt previous saw you 4/20.  Pt had her medical records faxed over from [place].  Her previous colon/path reports will be sent to you for review.

## 2019-05-21 ENCOUNTER — Ambulatory Visit: Payer: Medicare Other | Admitting: Cardiology

## 2019-05-22 ENCOUNTER — Encounter

## 2019-05-22 ENCOUNTER — Ambulatory Visit: Payer: Medicare Other | Admitting: Internal Medicine

## 2019-05-23 ENCOUNTER — Ambulatory Visit (INDEPENDENT_AMBULATORY_CARE_PROVIDER_SITE_OTHER): Payer: Medicare Other | Admitting: Podiatry

## 2019-05-23 ENCOUNTER — Encounter: Payer: Self-pay | Admitting: Podiatry

## 2019-05-23 ENCOUNTER — Other Ambulatory Visit: Payer: Self-pay

## 2019-05-23 VITALS — BP 136/61

## 2019-05-23 DIAGNOSIS — M79676 Pain in unspecified toe(s): Secondary | ICD-10-CM

## 2019-05-23 DIAGNOSIS — L600 Ingrowing nail: Secondary | ICD-10-CM

## 2019-05-23 MED ORDER — NEOMYCIN-POLYMYXIN-HC 3.5-10000-1 OT SOLN: OTIC | 0 refills | Status: DC

## 2019-05-23 NOTE — Telephone Encounter (Signed)
Left message on VM for pt to call back to schedule.

## 2019-05-23 NOTE — Patient Instructions (Signed)

## 2019-05-25 NOTE — Progress Notes (Signed)
Subjective:  Patient ID: Nancy Christensen, female    DOB: 1951/01/13,  MRN: [id number]  Chief Complaint  Patient presents with  . Ingrown Toenail    ingrown toenail right great toenail lateral side which has been going on for about a month, pt states that it has started getting darker at the top, and slowly move its way to the bottom   68 y.o. female presents with the above complaint. Hx as above.  Review of Systems: Negative except as noted in the HPI. Denies N/V/F/Ch.  Past Medical History:  Diagnosis Date  . Hyperlipidemia   . Hypertension     Current Outpatient Medications:  .  BILE SALTS-CAPS-CASC-PHENOLPTH PO, Take by mouth 3 (three) times daily. Once after each meal, Disp: , Rfl:  .  cholecalciferol (VITAMIN D3) 25 MCG (1000 UT) tablet, Take 1,000 Units by mouth daily., Disp: , Rfl:  .  diltiazem (CARDIZEM CD) 240 MG 24 hr capsule, Take 1 capsule (240 mg total) by mouth daily., Disp: 30 capsule, Rfl: 0 .  vitamin B-12 (CYANOCOBALAMIN) 1000 MCG tablet, Take 1,000 mcg by mouth daily., Disp: , Rfl:  .  vitamin C (ASCORBIC ACID) 500 MG tablet, Take 500 mg by mouth daily., Disp: , Rfl:  .  diclofenac sodium (VOLTAREN) 1 % GEL, APPLY 2 G TOPICALLY 4 (FOUR) TIMES DAILY AS NEEDED FOR KNEE PAIN, Disp: , Rfl:  .  neomycin-polymyxin-hydrocortisone (CORTISPORIN) OTIC solution, Apply 2 drops to the ingrown toenail site twice daily. Cover with band-aid., Disp: 10 mL, Rfl: 0 .  rosuvastatin (CRESTOR) 10 MG tablet, , Disp: , Rfl:   Social History   Tobacco Use  Smoking Status Never Smoker  Smokeless Tobacco Never Used    No Known Allergies Objective:   Vitals:   05/23/19 1438  BP: 136/61   There is no height or weight on file to calculate BMI. Constitutional Well developed. Well nourished.  Vascular Dorsalis pedis pulses palpable bilaterally. Posterior tibial pulses palpable bilaterally. Capillary refill normal to all digits.  No cyanosis or clubbing noted. Pedal hair  growth normal.  Neurologic Normal speech. Oriented to person, place, and time. Epicritic sensation to light touch grossly present bilaterally.  Dermatologic Painful ingrowing nail at lateral nail borders of the hallux nail right. No other open wounds. No skin lesions.  Orthopedic: Normal joint ROM without pain or crepitus bilaterally. No visible deformities. No bony tenderness.   Radiographs: None Assessment:   1. Ingrown nail   2. Pain around toenail    Plan:  Patient was evaluated and treated and all questions answered.  Ingrown Nail, right -Patient elects to proceed with minor surgery to remove ingrown toenail removal today. Consent reviewed and signed by patient. -Ingrown nail excised. See procedure note. -Educated on post-procedure care including soaking. Written instructions provided and reviewed. -Patient to follow up in 2 weeks for nail check.  Procedure: Excision of Ingrown Toenail Location: Right 1st lateral nail borders. Anesthesia: Lidocaine 1% plain; 1.5 mL and Marcaine 0.5% plain; 1.5 mL, digital block. Skin Prep: Betadine. Dressing: Silvadene; telfa; dry, sterile, compression dressing. Technique: Following skin prep, the toe was exsanguinated and a tourniquet was secured at the base of the toe. The affected nail border was freed, split with a nail splitter, and excised. Chemical matrixectomy was then performed with phenol and irrigated out with alcohol. The tourniquet was then removed and sterile dressing applied. Disposition: Patient tolerated procedure well. Patient to return in 2 weeks for follow-up.   Return in about 2 weeks (  around 06/06/2019) for Nail Check with Nurse.

## 2019-05-30 ENCOUNTER — Encounter: Payer: Self-pay | Admitting: Gastroenterology

## 2019-05-31 ENCOUNTER — Telehealth: Payer: Self-pay | Admitting: *Deleted

## 2019-05-31 NOTE — Telephone Encounter (Signed)
Pt states she had an ingrown toenail procedure 05/14/2019 and is having throbbing.

## 2019-05-31 NOTE — Telephone Encounter (Signed)
I called pt, she denies any redness, swelling or cloudy oozing, but does have the throbbing and itching at the end of the day. Pt states she walks for 1 hour every day, not at a fast pace. I told pt that the throbbing indicated swelling and the exercise activity may add to the swelling that the procedure may incur. I told pt to continue the soaks and the drops, she would probably have swelling and discomfort on and off for the next 3-4 weeks. I told pt to call with any concerns.

## 2019-06-06 ENCOUNTER — Ambulatory Visit: Payer: Medicare Other

## 2019-06-06 ENCOUNTER — Other Ambulatory Visit: Payer: Self-pay

## 2019-06-06 DIAGNOSIS — L600 Ingrowing nail: Secondary | ICD-10-CM

## 2019-06-06 NOTE — Patient Instructions (Signed)

## 2019-06-11 DIAGNOSIS — I8312 Varicose veins of left lower extremity with inflammation: Secondary | ICD-10-CM | POA: Diagnosis not present

## 2019-06-13 DIAGNOSIS — I8312 Varicose veins of left lower extremity with inflammation: Secondary | ICD-10-CM | POA: Diagnosis not present

## 2019-06-19 ENCOUNTER — Other Ambulatory Visit: Payer: Self-pay

## 2019-06-19 ENCOUNTER — Encounter: Payer: Self-pay | Admitting: Family Medicine

## 2019-06-19 ENCOUNTER — Ambulatory Visit (INDEPENDENT_AMBULATORY_CARE_PROVIDER_SITE_OTHER): Payer: Medicare Other | Admitting: Family Medicine

## 2019-06-19 VITALS — BP 138/84 | HR 48 | Temp 97.7°F | Wt 199.0 lb

## 2019-06-19 DIAGNOSIS — R922 Inconclusive mammogram: Secondary | ICD-10-CM | POA: Diagnosis not present

## 2019-06-19 DIAGNOSIS — I10 Essential (primary) hypertension: Secondary | ICD-10-CM | POA: Diagnosis not present

## 2019-06-19 DIAGNOSIS — Z1239 Encounter for other screening for malignant neoplasm of breast: Secondary | ICD-10-CM | POA: Diagnosis not present

## 2019-06-19 DIAGNOSIS — Z87898 Personal history of other specified conditions: Secondary | ICD-10-CM

## 2019-06-19 DIAGNOSIS — E782 Mixed hyperlipidemia: Secondary | ICD-10-CM

## 2019-06-19 DIAGNOSIS — Z0000 Encounter for general adult medical examination without abnormal findings: Secondary | ICD-10-CM

## 2019-06-19 LAB — LIPID PANEL
Cholesterol: 279 mg/dL — ABNORMAL HIGH (ref 0–200)
HDL: 53.3 mg/dL (ref >39.00)
LDL Cholesterol: 199 mg/dL — ABNORMAL HIGH (ref 0–99)
NonHDL: 225.78
Total CHOL/HDL Ratio: 5
Triglycerides: 133 mg/dL (ref 0.0–149.0)
VLDL: 26.6 mg/dL (ref 0.0–40.0)

## 2019-06-19 LAB — CBC WITH DIFFERENTIAL/PLATELET
Basophils Absolute: 0.1 10*3/uL (ref 0.0–0.1)
Basophils Relative: 1 % (ref 0.0–3.0)
Eosinophils Absolute: 0.1 10*3/uL (ref 0.0–0.7)
Eosinophils Relative: 1.2 % (ref 0.0–5.0)
HCT: 42.2 % (ref 36.0–46.0)
Hemoglobin: 14.2 g/dL (ref 12.0–15.0)
Lymphocytes Relative: 39.8 % (ref 12.0–46.0)
Lymphs Abs: 3 10*3/uL (ref 0.7–4.0)
MCHC: 33.7 g/dL (ref 30.0–36.0)
MCV: 84.7 fl (ref 78.0–100.0)
Monocytes Absolute: 0.5 10*3/uL (ref 0.1–1.0)
Monocytes Relative: 6.7 % (ref 3.0–12.0)
Neutro Abs: 3.9 10*3/uL (ref 1.4–7.7)
Neutrophils Relative %: 51.3 % (ref 43.0–77.0)
Platelets: 208 10*3/uL (ref 150.0–400.0)
RBC: 4.98 Mil/uL (ref 3.87–5.11)
RDW: 13.9 % (ref 11.5–15.5)
WBC: 7.5 10*3/uL (ref 4.0–10.5)

## 2019-06-19 LAB — POCT URINALYSIS DIPSTICK
Bilirubin, UA: NEGATIVE
Blood, UA: NEGATIVE
Glucose, UA: NEGATIVE
Ketones, UA: NEGATIVE
Leukocytes, UA: NEGATIVE
Nitrite, UA: NEGATIVE
Odor: NEGATIVE
Protein, UA: POSITIVE — AB
Spec Grav, UA: 1.02 (ref 1.010–1.025)
Urobilinogen, UA: 0.2 E.U./dL
pH, UA: 6 (ref 5.0–8.0)

## 2019-06-19 LAB — BASIC METABOLIC PANEL
BUN: 13 mg/dL (ref 6–23)
CO2: 28 mEq/L (ref 19–32)
Calcium: 10.2 mg/dL (ref 8.4–10.5)
Chloride: 100 mEq/L (ref 96–112)
Creatinine, Ser: 1.15 mg/dL (ref 0.40–1.20)
GFR: 56.59 mL/min — ABNORMAL LOW (ref >60.00)
Glucose, Bld: 94 mg/dL (ref 70–99)
Potassium: 3.8 mEq/L (ref 3.5–5.1)
Sodium: 137 mEq/L (ref 135–145)

## 2019-06-19 LAB — HEMOGLOBIN A1C: Hgb A1c MFr Bld: 5.9 % (ref 4.6–6.5)

## 2019-06-19 MED ORDER — DILTIAZEM HCL ER COATED BEADS 240 MG PO CP24: 240.0000 mg | ORAL_CAPSULE | Freq: Every day | ORAL | 3 refills | Status: DC

## 2019-06-19 NOTE — Patient Instructions (Signed)
Health Maintenance After Age 69 After age 69, you are at a higher risk for certain long-term diseases and infections as well as injuries from falls. Falls are a major cause of broken bones and head injuries in people who are older than age 69. Getting regular preventive care can help to keep you healthy and well. Preventive care includes getting regular testing and making lifestyle changes as recommended by your health care provider. Talk with your health care provider about:  Which screenings and tests you should have. A screening is a test that checks for a disease when you have no symptoms.  A diet and exercise plan that is right for you. What should I know about screenings and tests to prevent falls? Screening and testing are the best ways to find a health problem early. Early diagnosis and treatment give you the best chance of managing medical conditions that are common after age 69. Certain conditions and lifestyle choices may make you more likely to have a fall. Your health care provider may recommend:  Regular vision checks. Poor vision and conditions such as cataracts can make you more likely to have a fall. If you wear glasses, make sure to get your prescription updated if your vision changes.  Medicine review. Work with your health care provider to regularly review all of the medicines you are taking, including over-the-counter medicines. Ask your health care provider about any side effects that may make you more likely to have a fall. Tell your health care provider if any medicines that you take make you feel dizzy or sleepy.  Osteoporosis screening. Osteoporosis is a condition that causes the bones to get weaker. This can make the bones weak and cause them to break more easily.  Blood pressure screening. Blood pressure changes and medicines to control blood pressure can make you feel dizzy.  Strength and balance checks. Your health care provider may recommend certain tests to check your  strength and balance while standing, walking, or changing positions.  Foot health exam. Foot pain and numbness, as well as not wearing proper footwear, can make you more likely to have a fall.  Depression screening. You may be more likely to have a fall if you have a fear of falling, feel emotionally low, or feel unable to do activities that you used to do.  Alcohol use screening. Using too much alcohol can affect your balance and may make you more likely to have a fall. What actions can I take to lower my risk of falls? General instructions  Talk with your health care provider about your risks for falling. Tell your health care provider if: ? You fall. Be sure to tell your health care provider about all falls, even ones that seem minor. ? You feel dizzy, sleepy, or off-balance.  Take over-the-counter and prescription medicines only as told by your health care provider. These include any supplements.  Eat a healthy diet and maintain a healthy weight. A healthy diet includes low-fat dairy products, low-fat (lean) meats, and fiber from whole grains, beans, and lots of fruits and vegetables. Home safety  Remove any tripping hazards, such as rugs, cords, and clutter.  Install safety equipment such as grab bars in bathrooms and safety rails on stairs.  Keep rooms and walkways well-lit. Activity   Follow a regular exercise program to stay fit. This will help you maintain your balance. Ask your health care provider what types of exercise are appropriate for you.  If you need a cane or   walker, use it as recommended by your health care provider.  Wear supportive shoes that have nonskid soles. Lifestyle  Do not drink alcohol if your health care provider tells you not to drink.  If you drink alcohol, limit how much you have: ? 0-1 drink a day for women. ? 0-2 drinks a day for men.  Be aware of how much alcohol is in your drink. In the [place] one drink equals one typical bottle of beer (12  oz), one-half glass of wine (5 oz), or one shot of hard liquor (1 oz).  Do not use any products that contain nicotine or tobacco, such as cigarettes and e-cigarettes. If you need help quitting, ask your health care provider. Summary  Having a healthy lifestyle and getting preventive care can help to protect your health and wellness after age 82.  Screening and testing are the best way to find a health problem early and help you avoid having a fall. Early diagnosis and treatment give you the best chance for managing medical conditions that are more common for people who are older than age 67.  Falls are a major cause of broken bones and head injuries in people who are older than age 60. Take precautions to prevent a fall at home.  Work with your health care provider to learn what changes you can make to improve your health and wellness and to prevent falls. This information is not intended to replace advice given to you by your health care provider. Make sure you discuss any questions you have with your health care provider. Document Released: 07/26/2017 Document Revised: 01/03/2019 Document Reviewed: 07/26/2017 Elsevier Patient Education  2020 [employer].  Managing Your Hypertension Hypertension is commonly called high blood pressure. This is when the force of your blood pressing against the walls of your arteries is too strong. Arteries are blood vessels that carry blood from your heart throughout your body. Hypertension forces the heart to work harder to pump blood, and may cause the arteries to become narrow or stiff. Having untreated or uncontrolled hypertension can cause heart attack, stroke, kidney disease, and other problems. What are blood pressure readings? A blood pressure reading consists of a higher number over a lower number. Ideally, your blood pressure should be below 120/80. The first ("top") number is called the systolic pressure. It is a measure of the pressure in your  arteries as your heart beats. The second ("bottom") number is called the diastolic pressure. It is a measure of the pressure in your arteries as the heart relaxes. What does my blood pressure reading mean? Blood pressure is classified into four stages. Based on your blood pressure reading, your health care provider may use the following stages to determine what type of treatment you need, if any. Systolic pressure and diastolic pressure are measured in a unit called mm Hg. Normal  Systolic pressure: below 123456.  Diastolic pressure: below 80. Elevated  Systolic pressure: [id number].  Diastolic pressure: below 80. Hypertension stage 1  Systolic pressure: 0000000.  Diastolic pressure: [id number]. Hypertension stage 2  Systolic pressure: [id number] or above.  Diastolic pressure: 90 or above. What health risks are associated with hypertension? Managing your hypertension is an important responsibility. Uncontrolled hypertension can lead to:  A heart attack.  A stroke.  A weakened blood vessel (aneurysm).  Heart failure.  Kidney damage.  Eye damage.  Metabolic syndrome.  Memory and concentration problems. What changes can I make to manage my hypertension? Hypertension can be managed by making  lifestyle changes and possibly by taking medicines. Your health care provider will help you make a plan to bring your blood pressure within a normal range. Eating and drinking   Eat a diet that is high in fiber and potassium, and low in salt (sodium), added sugar, and fat. An example eating plan is called the DASH (Dietary Approaches to Stop Hypertension) diet. To eat this way: ? Eat plenty of fresh fruits and vegetables. Try to fill half of your plate at each meal with fruits and vegetables. ? Eat whole grains, such as whole wheat pasta, brown rice, or whole grain bread. Fill about one quarter of your plate with whole grains. ? Eat low-fat diary products. ? Avoid fatty cuts of meat, processed or  cured meats, and poultry with skin. Fill about one quarter of your plate with lean proteins such as fish, chicken without skin, beans, eggs, and tofu. ? Avoid premade and processed foods. These tend to be higher in sodium, added sugar, and fat.  Reduce your daily sodium intake. Most people with hypertension should eat less than 1,500 mg of sodium a day.  Limit alcohol intake to no more than 1 drink a day for nonpregnant women and 2 drinks a day for men. One drink equals 12 oz of beer, 5 oz of wine, or 1 oz of hard liquor. Lifestyle  Work with your health care provider to maintain a healthy body weight, or to lose weight. Ask what an ideal weight is for you.  Get at least 30 minutes of exercise that causes your heart to beat faster (aerobic exercise) most days of the week. Activities may include walking, swimming, or biking.  Include exercise to strengthen your muscles (resistance exercise), such as weight lifting, as part of your weekly exercise routine. Try to do these types of exercises for 30 minutes at least 3 days a week.  Do not use any products that contain nicotine or tobacco, such as cigarettes and e-cigarettes. If you need help quitting, ask your health care provider.  Control any long-term (chronic) conditions you have, such as high cholesterol or diabetes. Monitoring  Monitor your blood pressure at home as told by your health care provider. Your personal target blood pressure may vary depending on your medical conditions, your age, and other factors.  Have your blood pressure checked regularly, as often as told by your health care provider. Working with your health care provider  Review all the medicines you take with your health care provider because there may be side effects or interactions.  Talk with your health care provider about your diet, exercise habits, and other lifestyle factors that may be contributing to hypertension.  Visit your health care provider regularly.  Your health care provider can help you create and adjust your plan for managing hypertension. Will I need medicine to control my blood pressure? Your health care provider may prescribe medicine if lifestyle changes are not enough to get your blood pressure under control, and if:  Your systolic blood pressure is 130 or higher.  Your diastolic blood pressure is 80 or higher. Take medicines only as told by your health care provider. Follow the directions carefully. Blood pressure medicines must be taken as prescribed. The medicine does not work as well when you skip doses. Skipping doses also puts you at risk for problems. Contact a health care provider if:  You think you are having a reaction to medicines you have taken.  You have repeated (recurrent) headaches.  You feel  dizzy.  You have swelling in your ankles.  You have trouble with your vision. Get help right away if:  You develop a severe headache or confusion.  You have unusual weakness or numbness, or you feel faint.  You have severe pain in your chest or abdomen.  You vomit repeatedly.  You have trouble breathing. Summary  Hypertension is when the force of blood pumping through your arteries is too strong. If this condition is not controlled, it may put you at risk for serious complications.  Your personal target blood pressure may vary depending on your medical conditions, your age, and other factors. For most people, a normal blood pressure is less than 120/80.  Hypertension is managed by lifestyle changes, medicines, or both. Lifestyle changes include weight loss, eating a healthy, low-sodium diet, exercising more, and limiting alcohol. This information is not intended to replace advice given to you by your health care provider. Make sure you discuss any questions you have with your health care provider. Document Released: 06/06/2012 Document Revised: 01/04/2019 Document Reviewed: 08/10/2016 Elsevier Patient Education   [place].  Dyslipidemia Dyslipidemia is an imbalance of waxy, fat-like substances (lipids) in the blood. The body needs lipids in small amounts. Dyslipidemia often involves a high level of cholesterol or triglycerides, which are types of lipids. Common forms of dyslipidemia include:  High levels of LDL cholesterol. LDL is the type of cholesterol that causes fatty deposits (plaques) to build up in the blood vessels that carry blood away from your heart (arteries).  Low levels of HDL cholesterol. HDL cholesterol is the type of cholesterol that protects against heart disease. High levels of HDL remove the LDL buildup from arteries.  High levels of triglycerides. Triglycerides are a fatty substance in the blood that is linked to a buildup of plaques in the arteries. What are the causes? Primary dyslipidemia is caused by changes (mutations) in genes that are passed down through families (inherited). These mutations cause several types of dyslipidemia. Secondary dyslipidemia is caused by lifestyle choices and diseases that lead to dyslipidemia, such as:  Eating a diet that is high in animal fat.  Not getting enough exercise.  Having diabetes, kidney disease, liver disease, or thyroid disease.  Drinking large amounts of alcohol.  Using certain medicines. What increases the risk? You are more likely to develop this condition if you are an older man or if you are a woman who has gone through menopause. Other risk factors include:  Having a family history of dyslipidemia.  Taking certain medicines, including birth control pills, steroids, some diuretics, and beta-blockers.  Smoking cigarettes.  Eating a high-fat diet.  Having certain medical conditions such as diabetes, polycystic ovary syndrome (PCOS), kidney disease, liver disease, or hypothyroidism.  Not exercising regularly.  Being overweight or obese with too much belly fat. What are the signs or symptoms? In most  cases, dyslipidemia does not usually cause any symptoms. In severe cases, very high lipid levels can cause:  Fatty bumps under the skin (xanthomas).  White or gray ring around the black center (pupil) of the eye. Very high triglyceride levels can cause inflammation of the pancreas (pancreatitis). How is this diagnosed? Your health care provider may diagnose dyslipidemia based on a routine blood test (fasting blood test). Because most people do not have symptoms of the condition, this blood testing (lipid profile) is done on adults age 83 and older and is repeated every 5 years. This test checks:  Total cholesterol. This measures the total  amount of cholesterol in your blood, including LDL cholesterol, HDL cholesterol, and triglycerides. A healthy number is below 200.  LDL cholesterol. The target number for LDL cholesterol is different for each person, depending on individual risk factors. Ask your health care provider what your LDL cholesterol should be.  HDL cholesterol. An HDL level of 60 or higher is best because it helps to protect against heart disease. A number below 94 for men or below 35 for women increases the risk for heart disease.  Triglycerides. A healthy triglyceride number is below 150. If your lipid profile is abnormal, your health care provider may do other blood tests. How is this treated? Treatment depends on the type of dyslipidemia that you have and your other risk factors for heart disease and stroke. Your health care provider will have a target range for your lipid levels based on this information. For many people, this condition may be treated by lifestyle changes, such as diet and exercise. Your health care provider may recommend that you:  Get regular exercise.  Make changes to your diet.  Quit smoking if you smoke. If diet changes and exercise do not help you reach your goals, your health care provider may also prescribe medicine to lower lipids. The most  commonly prescribed type of medicine lowers your LDL cholesterol (statin drug). If you have a high triglyceride level, your provider may prescribe another type of drug (fibrate) or an omega-3 fish oil supplement, or both. Follow these instructions at home:  Eating and drinking  Follow instructions from your health care provider or dietitian about eating or drinking restrictions.  Eat a healthy diet as told by your health care provider. This can help you reach and maintain a healthy weight, lower your LDL cholesterol, and raise your HDL cholesterol. This may include: ? Limiting your calories, if you are overweight. ? Eating more fruits, vegetables, whole grains, fish, and lean meats. ? Limiting saturated fat, trans fat, and cholesterol.  If you drink alcohol: ? Limit how much you use. ? Be aware of how much alcohol is in your drink. In the [place] one drink equals one 12 oz bottle of beer (355 mL), one 5 oz glass of wine (148 mL), or one 1 oz glass of hard liquor (44 mL).  Do not drink alcohol if: ? Your health care provider tells you not to drink. ? You are pregnant, may be pregnant, or are planning to become pregnant. Activity  Get regular exercise. Start an exercise and strength training program as told by your health care provider. Ask your health care provider what activities are safe for you. Your health care provider may recommend: ? 30 minutes of aerobic activity 4-6 days a week. Brisk walking is an example of aerobic activity. ? Strength training 2 days a week. General instructions  Do not use any products that contain nicotine or tobacco, such as cigarettes, e-cigarettes, and chewing tobacco. If you need help quitting, ask your health care provider.  Take over-the-counter and prescription medicines only as told by your health care provider. This includes supplements.  Keep all follow-up visits as told by your health care provider. Contact a health care provider if:  You are:  ? Having trouble sticking to your exercise or diet plan. ? Struggling to quit smoking or control your use of alcohol. Summary  Dyslipidemia often involves a high level of cholesterol or triglycerides, which are types of lipids.  Treatment depends on the type of dyslipidemia that you have and  your other risk factors for heart disease and stroke.  For many people, treatment starts with lifestyle changes, such as diet and exercise.  Your health care provider may prescribe medicine to lower lipids. This information is not intended to replace advice given to you by your health care provider. Make sure you discuss any questions you have with your health care provider. Document Released: 09/17/2013 Document Revised: 05/07/2018 Document Reviewed: 04/13/2018 Elsevier Patient Education  [place].

## 2019-06-19 NOTE — Progress Notes (Signed)
Subjective:   Nancy Christensen is a 68 y.o. female who presents for Medicare Annual (Subsequent) preventive examination. Pt states she is doing well overall.  She has an upcoming appt with Cardiology.  She is no longer having palpitations.  Pt was seen by Podiatry for ingrown toenail, s/p removal 3 wks ago.  Denies pain in toe.  Pt walking 3 miles per day for exercise.  Taking diltiazem, needs refill.  BP 117/72-76.  Pt had laser treatment on varicose veins, notes improvement.  Pt needs to schedule mammogram, but need referral for 3D study 2/2 fibrodense breast tissue.  Review of Systems:  Comprehensive review of systems negative unless otherwise stated above.      Objective:     Vitals: BP 138/84 (BP Location: Left Arm, Patient Position: Sitting, Cuff Size: Large)    Pulse (!) 48    Temp 97.7 F (36.5 C) (Oral)    Wt 199 lb (90.3 kg)    SpO2 98%    BMI 34.16 kg/m   Body mass index is 34.16 kg/m.  Gen. Pleasant, well developed, well-nourished, in NAD HEENT - [place]/AT, PERRL, conjunctive clear, no scleral icterus, no nasal drainage, pharynx without erythema or exudate. Neck: No JVD, no thyromegaly, no carotid bruits Lungs: no use of accessory muscles, CTAB, no wheezes, rales or rhonchi Cardiovascular: RRR, No r/g/m, no peripheral edema Abdomen: BS present, soft, nontender,nondistended, no hepatosplenomegaly Musculoskeletal: No deformities, moves all four extremities, no cyanosis or clubbing, normal tone Neuro:  A&Ox3, CN II-XII intact, normal gait Skin:  Warm, dry, intact, no lesions    Advanced Directives 09/02/2018 02/12/2018 02/11/2018 02/11/2018  Does Patient Have a Medical Advance Directive? Yes Yes Yes Yes  Type of Paramedic of [place];Living will [place];Living will Healthcare Power of [place]  Does patient want to make changes to medical advance directive? - No - Patient declined No - Patient declined -   Copy of [place] in Chart? - No - copy requested - -    Tobacco Social History   Tobacco Use  Smoking Status Never Smoker  Smokeless Tobacco Never Used     Counseling given: Not Answered   Past Medical History:  Diagnosis Date   Hyperlipidemia    Hypertension    Past Surgical History:  Procedure Laterality Date   ABDOMINAL HYSTERECTOMY  1997   BREAST SURGERY  2002   left cyst removal    CHOLECYSTECTOMY N/A 02/12/2018   Procedure: LAPAROSCOPIC CHOLECYSTECTOMY;  Surgeon: Leighton Ruff, MD;  Location: [facility];  Service: General;  Laterality: N/A;   Family History  Problem Relation Age of Onset   Diabetes Mother    Hypertension Mother    Thyroid disease Mother    Vascular Disease Mother    Lung cancer Father    Social History   Socioeconomic History   Marital status: Divorced    Spouse name: Not on file   Number of children: Not on file   Years of education: Not on file   Highest education level: Not on file  Occupational History   Not on file  Social Needs   Financial resource strain: Not on file   Food insecurity    Worry: Not on file    Inability: Not on file   Transportation needs    Medical: Not on file    Non-medical: Not on file  Tobacco Use   Smoking status: Never Smoker   Smokeless tobacco: Never  Used  Substance and Sexual Activity   Alcohol use: Never    Frequency: Never   Drug use: Not Currently   Sexual activity: Not on file  Lifestyle   Physical activity    Days per week: Not on file    Minutes per session: Not on file   Stress: Not on file  Relationships   Social connections    Talks on phone: Not on file    Gets together: Not on file    Attends religious service: Not on file    Active member of club or organization: Not on file    Attends meetings of clubs or organizations: Not on file    Relationship status: Not on file  Other Topics Concern   Not on file  Social History Narrative     Not on file    Outpatient Encounter Medications as of 06/19/2019  Medication Sig   BILE SALTS-CAPS-CASC-PHENOLPTH PO Take by mouth 3 (three) times daily. Once after each meal   cholecalciferol (VITAMIN D3) 25 MCG (1000 UT) tablet Take 1,000 Units by mouth daily.   diclofenac sodium (VOLTAREN) 1 % GEL APPLY 2 G TOPICALLY 4 (FOUR) TIMES DAILY AS NEEDED FOR KNEE PAIN   diltiazem (CARDIZEM CD) 240 MG 24 hr capsule Take 1 capsule (240 mg total) by mouth daily.   neomycin-polymyxin-hydrocortisone (CORTISPORIN) OTIC solution Apply 2 drops to the ingrown toenail site twice daily. Cover with band-aid.   rosuvastatin (CRESTOR) 10 MG tablet    vitamin B-12 (CYANOCOBALAMIN) 1000 MCG tablet Take 1,000 mcg by mouth daily.   vitamin C (ASCORBIC ACID) 500 MG tablet Take 500 mg by mouth daily.   [DISCONTINUED] diltiazem (CARDIZEM CD) 240 MG 24 hr capsule Take 1 capsule (240 mg total) by mouth daily.   No facility-administered encounter medications on file as of 06/19/2019.     Activities of Daily Living No flowsheet data found.  Patient Care Team: Billie Ruddy, MD as PCP - General [facility] Medicine)  Cardiology- Dr. Jenne Campus Pulmonology- Dr. Christinia Gully Orthopedics Dr. Frankey Shown Vascular- Dr. Jones Skene GI-Dr. Thornton Park    Assessment:   This is a routine wellness examination for [name].  Exercise Activities and Dietary recommendations    Goals   None     Fall Risk Fall Risk  06/19/2019 01/03/2018  Falls in the past year? 0 No   Is the patient's home free of loose throw rugs in walkways, pet beds, electrical cords, etc?   no      Grab bars in the bathroom? no      Handrails on the stairs?   yes      Adequate lighting?   yes  Depression Screen PHQ 2/9 Scores 06/19/2019 01/03/2018  PHQ - 2 Score 0 0     Cognitive Function  A&O x 3.  No issues with recall. Denies hearing or vision issues.       There is no immunization history on file for this  patient. Screening Tests Health Maintenance  Topic Date Due   Hepatitis C Screening  11/09/1950   TETANUS/TDAP  05/03/1969   MAMMOGRAM  05/03/2000   COLONOSCOPY  05/03/2000   DEXA SCAN  05/04/2015   PNA vac Low Risk Adult (1 of 2 - PCV13) 05/04/2015   INFLUENZA VACCINE  04/27/2019    Cancer Screenings: Lung: Low Dose CT Chest recommended if Age 9-80 years, 30 pack-year currently smoking OR have quit w/in 15years. Patient does not qualify. Breast:  Up to date on  Mammogram? Needs 3D mammogram ordered 2/2 h/o fibrodense breast.  Up to date of Bone Density/Dexa? Yes Colorectal: done 2015    Plan:    Essential hypertension  -stable -discussed lifestyle modifications -continue current meds - Plan: CBC with Differential/Platelet, Basic metabolic panel, diltiazem (CARDIZEM CD) 240 MG 24 hr capsule, POCT urinalysis dipstick  Mixed hyperlipidemia  - Plan: Lipid panel  Breast cancer screening  - Plan: MM Digital Screening  Dense breast tissue  - Plan: MM Digital Screening  History of prediabetes  -Discussed lifestyle modifications - Plan: Hemoglobin A1c   I have personally reviewed and noted the following in the patients chart:    Medical and social history  Use of alcohol, tobacco or illicit drugs   Current medications and supplements  Functional ability and status  Nutritional status  Physical activity  Advanced directives  List of other physicians  Hospitalizations, surgeries, and ER visits in previous 12 months  Vitals  Screenings to include cognitive, depression, and falls  Referrals and appointments  In addition, I have reviewed and discussed with patient certain preventive protocols, quality metrics, and best practice recommendations. A written personalized care plan for preventive services as well as general preventive health recommendations were provided to patient.    Billie Ruddy, MD  06/19/2019

## 2019-06-21 ENCOUNTER — Encounter: Payer: Self-pay | Admitting: Family Medicine

## 2019-06-25 ENCOUNTER — Encounter: Payer: Self-pay | Admitting: Orthopaedic Surgery

## 2019-06-25 ENCOUNTER — Ambulatory Visit (INDEPENDENT_AMBULATORY_CARE_PROVIDER_SITE_OTHER): Payer: Medicare Other | Admitting: Orthopaedic Surgery

## 2019-06-25 DIAGNOSIS — M1612 Unilateral primary osteoarthritis, left hip: Secondary | ICD-10-CM | POA: Diagnosis not present

## 2019-06-25 NOTE — Progress Notes (Signed)
Office Visit Note   Patient: Nancy Christensen           Date of Birth: Oct 02, 1950           MRN: [id number] Visit Date: 06/25/2019              Requested by: Billie Ruddy, MD [place],  [place] [id number] PCP: Billie Ruddy, MD   Assessment & Plan: Visit Diagnoses:  1. Unilateral primary osteoarthritis, left hip     Plan: Impression is left hip osteoarthritis.  We will refer the patient to Dr. Junius Roads for an ultrasound-guided cortisone injection to the left hip joint.  She will follow-up with [place] as needed.  Follow-Up Instructions: Return for f/u with Dr. Junius Roads for left hip joint injection.   Orders:  No orders of the defined types were placed in this encounter.  No orders of the defined types were placed in this encounter.     Procedures: No procedures performed   Clinical Data: No additional findings.   Subjective: Chief Complaint  Patient presents with  . Left Hip - Pain    HPI patient is a pleasant 68 year old female who presents our clinic today with left groin pain.  This is been ongoing for the past few months.  The pain she has is to the groin and radiates down the medial thigh and into the knee at times.  She describes this as a constant pain worse going from a seated to standing position but sometimes there are no specific aggravators.  No radicular symptoms.  She has tried over-the-counter medications without significant relief of symptoms.  She does note an injury approximately 10 years ago while running for which she had the same sort of pain.  It was intermittent until recently.  Review of Systems as detailed in HPI.  All others reviewed and are negative.   Objective: Vital Signs: There were no vitals taken for this visit.  Physical Exam well-developed and well-nourished female no acute distress.  Alert and oriented x3.  Ortho Exam examination of her left hip reveals pain with the extremes of forward flexion and internal  rotation.  Negative straight leg raise.  No focal weakness.  She is neurovascular intact distally. Specialty Comments:  No specialty comments available.  Imaging: No new imaging    PMFS History: Patient Active Problem List   Diagnosis Date Noted  . Dizziness 10/25/2018  . Sinus bradycardia 10/25/2018  . Atypical chest pain 10/25/2018  . Dyslipidemia 10/25/2018  . Chronic right shoulder pain 07/04/2018  . Unilateral primary osteoarthritis, left knee 06/14/2018  . Essential hypertension 04/28/2018  . Hypertensive disorder 04/18/2018  . Cholecystitis with cholelithiasis 02/11/2018  . Multiple pulmonary nodules determined by computed tomography of lung 01/26/2018  . Upper airway cough syndrome 01/25/2018   Past Medical History:  Diagnosis Date  . Hyperlipidemia   . Hypertension     Family History  Problem Relation Age of Onset  . Diabetes Mother   . Hypertension Mother   . Thyroid disease Mother   . Vascular Disease Mother   . Lung cancer Father     Past Surgical History:  Procedure Laterality Date  . ABDOMINAL HYSTERECTOMY  1997  . BREAST SURGERY  2002   left cyst removal   . CHOLECYSTECTOMY N/A 02/12/2018   Procedure: LAPAROSCOPIC CHOLECYSTECTOMY;  Surgeon: Leighton Ruff, MD;  Location: [facility];  Service: General;  Laterality: N/A;   Social History   Occupational History  . Not  on file  Tobacco Use  . Smoking status: Never Smoker  . Smokeless tobacco: Never Used  Substance and Sexual Activity  . Alcohol use: Never    Frequency: Never  . Drug use: Not Currently  . Sexual activity: Not on file

## 2019-06-28 ENCOUNTER — Ambulatory Visit: Payer: Medicare Other | Admitting: Family Medicine

## 2019-06-28 ENCOUNTER — Other Ambulatory Visit: Payer: Self-pay

## 2019-06-28 ENCOUNTER — Ambulatory Visit (AMBULATORY_SURGERY_CENTER): Payer: Self-pay | Admitting: *Deleted

## 2019-06-28 VITALS — Temp 96.5°F | Ht 64.0 in | Wt 202.8 lb

## 2019-06-28 DIAGNOSIS — K625 Hemorrhage of anus and rectum: Secondary | ICD-10-CM

## 2019-06-28 MED ORDER — NA SULFATE-K SULFATE-MG SULF 17.5-3.13-1.6 GM/177ML PO SOLN: 1.0000 | Freq: Once | ORAL | 0 refills | Status: AC

## 2019-06-28 NOTE — Progress Notes (Signed)

## 2019-06-28 NOTE — Progress Notes (Signed)
Patient is here today for follow-up appointment, recent procedure performed on 05/23/2019.  Removal of ingrown toenail.  She states that she is continues to soak, and is not having pain at this time.  No redness, no swelling, mild maceration around the nailbed.  There is no signs and symptoms of infection at this time.  Discussed signs and symptoms of infection with patient, verbal and written instructions were given.  She is to follow-up as needed with any acute symptom changes.

## 2019-07-05 ENCOUNTER — Other Ambulatory Visit: Payer: Self-pay

## 2019-07-05 ENCOUNTER — Ambulatory Visit
Admission: RE | Admit: 2019-07-05 | Discharge: 2019-07-05 | Disposition: A | Discharge status: Home / Self Care | Payer: Medicare Other | Source: Ambulatory Visit | Attending: Family Medicine | Admitting: Family Medicine

## 2019-07-05 DIAGNOSIS — Z1231 Encounter for screening mammogram for malignant neoplasm of breast: Secondary | ICD-10-CM | POA: Diagnosis not present

## 2019-07-05 DIAGNOSIS — Z1239 Encounter for other screening for malignant neoplasm of breast: Secondary | ICD-10-CM

## 2019-07-05 DIAGNOSIS — R922 Inconclusive mammogram: Secondary | ICD-10-CM

## 2019-07-08 LAB — HM MAMMOGRAPHY

## 2019-07-09 ENCOUNTER — Ambulatory Visit: Payer: Medicare Other | Admitting: Cardiology

## 2019-07-11 ENCOUNTER — Telehealth: Payer: Self-pay

## 2019-07-11 NOTE — Telephone Encounter (Signed)
Pt responded "no" to all screening questions °

## 2019-07-11 NOTE — Telephone Encounter (Signed)
Covid-19 screening questions   Do you now or have you had a fever in the last 14 days?  Do you have any respiratory symptoms of shortness of breath or cough now or in the last 14 days?  Do you have any family members or close contacts with diagnosed or suspected Covid-19 in the past 14 days?  Have you been tested for Covid-19 and found to be positive?       

## 2019-07-12 ENCOUNTER — Other Ambulatory Visit: Payer: Self-pay

## 2019-07-12 ENCOUNTER — Encounter: Payer: Self-pay | Admitting: Gastroenterology

## 2019-07-12 ENCOUNTER — Ambulatory Visit (AMBULATORY_SURGERY_CENTER): Payer: Medicare Other | Admitting: Gastroenterology

## 2019-07-12 VITALS — BP 129/73 | HR 52 | Temp 97.6°F | Resp 12 | Ht 64.0 in | Wt 202.8 lb

## 2019-07-12 DIAGNOSIS — K5730 Diverticulosis of large intestine without perforation or abscess without bleeding: Secondary | ICD-10-CM | POA: Diagnosis not present

## 2019-07-12 DIAGNOSIS — D123 Benign neoplasm of transverse colon: Secondary | ICD-10-CM | POA: Diagnosis not present

## 2019-07-12 DIAGNOSIS — D122 Benign neoplasm of ascending colon: Secondary | ICD-10-CM | POA: Diagnosis not present

## 2019-07-12 DIAGNOSIS — K623 Rectal prolapse: Secondary | ICD-10-CM

## 2019-07-12 DIAGNOSIS — K625 Hemorrhage of anus and rectum: Secondary | ICD-10-CM

## 2019-07-12 DIAGNOSIS — Z1211 Encounter for screening for malignant neoplasm of colon: Secondary | ICD-10-CM | POA: Diagnosis not present

## 2019-07-12 DIAGNOSIS — D125 Benign neoplasm of sigmoid colon: Secondary | ICD-10-CM

## 2019-07-12 HISTORY — PX: COLONOSCOPY: SHX174

## 2019-07-12 MED ORDER — SODIUM CHLORIDE 0.9 % IV SOLN: 500.0000 mL | Freq: Once | INTRAVENOUS | Status: DC

## 2019-07-12 NOTE — Op Note (Signed)
[facility] Patient Name: Nancy Christensen Procedure Date: 07/12/2019 10:24 AM MRN: [id number] Endoscopist: Thornton Park MD, MD Age: 68 Referring MD:  Date of Birth: 03/30/1951 Gender: Female Account #: [id number] Procedure:                Colonoscopy Indications:              Intermittent rectal bleeding with pain, "walnut" at                            the rectum, and malodor over the last 30 days                           Patient reported diagnosis of hemorrhoids                           History of colon polyps removed in [place]                            6-7 years ago                           - surveillance recommended in 5-10 years                           Cholecystectomy 02/12/18 for cholecystitis and                            cholelithiasis                           Paternal uncle with colon cancer (no other known                            family history of colon cancer or                            polyps)Intermittent rectal bleeding with pain,                            "walnut" at the rectum, and malodor over the last                            30 days Medicines:                See the Anesthesia note for documentation of the                            administered medications Procedure:                Pre-Anesthesia Assessment:                           - Prior to the procedure, a History and Physical                            was performed, and patient medications and  allergies were reviewed. The patient's tolerance of                            previous anesthesia was also reviewed. The risks                            and benefits of the procedure and the sedation                            options and risks were discussed with the patient.                            All questions were answered, and informed consent                            was obtained. Prior Anticoagulants: The patient has    taken no previous anticoagulant or antiplatelet                            agents. ASA Grade Assessment: II - A patient with                            mild systemic disease. After reviewing the risks                            and benefits, the patient was deemed in                            satisfactory condition to undergo the procedure.                           After obtaining informed consent, the colonoscope                            was passed under direct vision. Throughout the                            procedure, the patient's blood pressure, pulse, and                            oxygen saturations were monitored continuously. The                            Colonoscope was introduced through the anus and                            advanced to the the terminal ileum, with                            identification of the appendiceal orifice and IC                            valve. A second forward view of the right colon was  performed. The colonoscopy was performed with                            moderate difficulty due to a redundant colon,                            significant looping and a tortuous colon.                            Successful completion of the procedure was aided by                            applying abdominal pressure. The patient tolerated                            the procedure well. The quality of the bowel                            preparation was good. The terminal ileum, ileocecal                            valve, appendiceal orifice, and rectum were                            photographed. Scope In: 10:31:44 AM Scope Out: 10:49:54 AM Scope Withdrawal Time: 0 hours 14 minutes 25 seconds  Total Procedure Duration: 0 hours 18 minutes 10 seconds  Findings:                 The digital rectal exam findings include rectal                            prolapse.                           A diffuse area of moderately melanotic  mucosa was                            found in the entire colon.                           Multiple small and large-mouthed diverticula were                            found in the entire colon. There was no evidence                            for diverticulitis.                           A less than 1 mm polyp was found in the ascending                            colon. The polyp was sessile. The polyp was removed  with a cold biopsy forceps. Resection and retrieval                            were complete. Estimated blood loss was minimal.                           A 2 mm polyp was found in the hepatic flexure. The                            polyp was sessile. The polyp was removed with a                            cold snare. Resection and retrieval were complete.                            Estimated blood loss was minimal.                           A 2 mm polyp was found in the sigmoid colon. The                            polyp was sessile. The polyp was removed with a                            cold snare. Resection and retrieval were complete.                            Estimated blood loss was minimal.                           The exam was otherwise without abnormality on                            direct and retroflexion views. Complications:            No immediate complications. Estimated blood loss:                            Minimal. Estimated Blood Loss:     Estimated blood loss was minimal. Impression:               - Rectal prolapse found on digital rectal exam.                           - Melanotic mucosa in the entire examined colon.                           - Diverticulosis in the entire examined colon.                           - One less than 1 mm polyp in the ascending colon,                            removed with a cold  biopsy forceps. Resected and                            retrieved.                           - One 2 mm polyp at the  hepatic flexure, removed                            with a cold snare. Resected and retrieved.                           - One 2 mm polyp in the sigmoid colon, removed with                            a cold snare. Resected and retrieved.                           - The examination was otherwise normal on direct                            and retroflexion views. Recommendation:           - Patient has a contact number available for                            emergencies. The signs and symptoms of potential                            delayed complications were discussed with the                            patient. Return to normal activities tomorrow.                            Written discharge instructions were provided to the                            patient.                           - High fiber diet.                           - Continue present medications.                           - Await pathology results.                           - Repeat colonoscopy date to be determined after                            pending pathology results are reviewed for  surveillance based on pathology results.                           - Referral for anorectal manometry.                           - Follow-up appointment with me two weeks after                            manometry. Thornton Park MD, MD 07/12/2019 10:59:13 AM This report has been signed electronically.

## 2019-07-12 NOTE — Patient Instructions (Signed)
Impression/Recommendations:  Diverticulosis handout given to patient. Polyp handout given to patient. High fiber diet handout given to patient.  Continue present medications. Await pathology results.  Repeat colonoscopy for surveillance.  Date to be determined after pathology results reviewed.  Referral for anorectal manometry.  Follow-up appointment with Dr. Tarri Glenn 2 weeks after manometry.  YOU HAD AN ENDOSCOPIC PROCEDURE TODAY AT [facility]:   Refer to the procedure report that was given to you for any specific questions about what was found during the examination.  If the procedure report does not answer your questions, please call your gastroenterologist to clarify.  If you requested that your care partner not be given the details of your procedure findings, then the procedure report has been included in a sealed envelope for you to review at your convenience later.  YOU SHOULD EXPECT: Some feelings of bloating in the abdomen. Passage of more gas than usual.  Walking can help get rid of the air that was put into your GI tract during the procedure and reduce the bloating. If you had a lower endoscopy (such as a colonoscopy or flexible sigmoidoscopy) you may notice spotting of blood in your stool or on the toilet paper. If you underwent a bowel prep for your procedure, you may not have a normal bowel movement for a few days.  Please Note:  You might notice some irritation and congestion in your nose or some drainage.  This is from the oxygen used during your procedure.  There is no need for concern and it should clear up in a day or so.  SYMPTOMS TO REPORT IMMEDIATELY:   Following lower endoscopy (colonoscopy or flexible sigmoidoscopy):  Excessive amounts of blood in the stool  Significant tenderness or worsening of abdominal pains  Swelling of the abdomen that is new, acute  Fever of 100F or higher For urgent or emergent issues, a gastroenterologist can be reached  at any hour by calling [phone].   DIET:  We do recommend a small meal at first, but then you may proceed to your regular diet.  Drink plenty of fluids but you should avoid alcoholic beverages for 24 hours.  ACTIVITY:  You should plan to take it easy for the rest of today and you should NOT DRIVE or use heavy machinery until tomorrow (because of the sedation medicines used during the test).    FOLLOW UP: Our staff will call the number listed on your records 48-72 hours following your procedure to check on you and address any questions or concerns that you may have regarding the information given to you following your procedure. If we do not reach you, we will leave a message.  We will attempt to reach you two times.  During this call, we will ask if you have developed any symptoms of COVID 19. If you develop any symptoms (ie: fever, flu-like symptoms, shortness of breath, cough etc.) before then, please call [phone].  If you test positive for Covid 19 in the 2 weeks post procedure, please call and report this information to [place].    If any biopsies were taken you will be contacted by phone or by letter within the next 1-3 weeks.  Please call us at [phone] if you have not heard about the biopsies in 3 weeks.    SIGNATURES/CONFIDENTIALITY: You and/or your care partner have signed paperwork which will be entered into your electronic medical record.  These signatures attest to the fact that that the information above  on your After Visit Summary has been reviewed and is understood.  Full responsibility of the confidentiality of this discharge information lies with you and/or your care-partner.

## 2019-07-12 NOTE — Progress Notes (Signed)
Pt. Asked what to do about rectal bleeding.  Dr. Tarri Glenn told pt. In report she will be referred to a rectal surgeon, Dr. Dema Severin.  I reported this to Ms. Kelso.

## 2019-07-12 NOTE — Progress Notes (Signed)
A and O x3. Report to RN. Tolerated MAC anesthesia well.

## 2019-07-12 NOTE — Progress Notes (Signed)
Temperature taken by K.A., VS taken by C.W. 

## 2019-07-12 NOTE — Progress Notes (Signed)
Pt's states no medical or surgical changes since previsit or office visit. 

## 2019-07-15 ENCOUNTER — Other Ambulatory Visit: Payer: Self-pay | Admitting: *Deleted

## 2019-07-15 ENCOUNTER — Encounter: Payer: Self-pay | Admitting: *Deleted

## 2019-07-15 ENCOUNTER — Telehealth: Payer: Self-pay | Admitting: *Deleted

## 2019-07-15 DIAGNOSIS — K6289 Other specified diseases of anus and rectum: Secondary | ICD-10-CM

## 2019-07-15 DIAGNOSIS — K625 Hemorrhage of anus and rectum: Secondary | ICD-10-CM

## 2019-07-15 NOTE — Telephone Encounter (Signed)
Per the procedure report, the patient has been scheduled for the following:   11/10 at 3:00 pm COVID screen (patient aware to quarantine)  11/13 at 8:00 am anorectal manometry at [facility]  Amb referral sent  Patient sent letter via [place] and mailed. Patient aware of prep. Patient verbalized understanding. No other questions voiced at the time of the phone call.

## 2019-07-16 ENCOUNTER — Encounter: Payer: Self-pay | Admitting: Family Medicine

## 2019-07-16 ENCOUNTER — Telehealth: Payer: Self-pay

## 2019-07-16 ENCOUNTER — Encounter: Payer: Self-pay | Admitting: Gastroenterology

## 2019-07-16 ENCOUNTER — Telehealth: Payer: Self-pay | Admitting: *Deleted

## 2019-07-16 NOTE — Telephone Encounter (Signed)
No answer for post procedure call back. Left message and will call back later this afternoon. SM

## 2019-07-16 NOTE — Telephone Encounter (Signed)
Second attempt follow up call to pt, lm on vm. 

## 2019-07-16 NOTE — Telephone Encounter (Signed)
Pt called back and states she if feeling fine. No questions at this time.

## 2019-07-19 DIAGNOSIS — I8312 Varicose veins of left lower extremity with inflammation: Secondary | ICD-10-CM | POA: Diagnosis not present

## 2019-07-22 NOTE — Telephone Encounter (Signed)
Okay for referral? Please advise   

## 2019-07-24 DIAGNOSIS — H40003 Preglaucoma, unspecified, bilateral: Secondary | ICD-10-CM | POA: Diagnosis not present

## 2019-07-25 ENCOUNTER — Ambulatory Visit (INDEPENDENT_AMBULATORY_CARE_PROVIDER_SITE_OTHER): Payer: Medicare Other

## 2019-07-25 ENCOUNTER — Ambulatory Visit (INDEPENDENT_AMBULATORY_CARE_PROVIDER_SITE_OTHER): Payer: Medicare Other | Admitting: Internal Medicine

## 2019-07-25 ENCOUNTER — Other Ambulatory Visit: Payer: Self-pay

## 2019-07-25 ENCOUNTER — Encounter: Payer: Self-pay | Admitting: Internal Medicine

## 2019-07-25 DIAGNOSIS — R918 Other nonspecific abnormal finding of lung field: Secondary | ICD-10-CM

## 2019-07-25 DIAGNOSIS — R058 Other specified cough: Secondary | ICD-10-CM

## 2019-07-25 DIAGNOSIS — R05 Cough: Secondary | ICD-10-CM

## 2019-07-25 NOTE — Patient Instructions (Signed)
Please remember to go to the  x-ray department  for your tests - we will call you with the results when they are available    If you are satisfied with your treatment plan,  let your doctor know     If in any way you are not 100% satisfied,  please tell us.  If 100% better, tell your friends!  Pulmonary follow up is as needed

## 2019-07-25 NOTE — Progress Notes (Signed)
Spoke with pt and notified of results per Dr. Wert. Pt verbalized understanding and denied any questions. 

## 2019-07-25 NOTE — Progress Notes (Signed)
Subjective:    Patient ID: Nancy Christensen, female   DOB: 1951/03/15    MRN: [id number]    Brief patient profile:  70 yobf never smoker with pattern of "bronchitis" while living in [place] that impoved off ACEi in 2012/2015 with w/u reportedly neg CT chest and retired to [employer] in Nov 2018 and started there in  Dec 2018 with intermittent  chest discomfort L upper with hbp > neg cards eval but incidental SPN's > pulmonary eval around Jan 2019 > rec reheck in 6 months and so referred to pulmonary clinic 01/25/2018 by Dr   Volanda Napoleon      History of Present Illness  01/25/2018 1st [facility] office visit/ Nancy Christensen   Chief Complaint  Patient presents with  . Pulmonary Consult    Referred by Dr. Volanda Napoleon for eval of pulmonary nodules. Pt c/o cough "for years". She went to ED while in [place] in Jan 2018 with left chest discomfort and CT Chest was done. She states she feels like she needs to cough up sputum, but it gets caught in her throat. Cough is esp worse at night when she lies down.   onset of cough  Was  1980  while living in  [place]  esp p stirring around assoc with pnds eval by allergy > neg  Worse with voice use/ perfumes/ or cold air/Neg foods/ bending urge to clear the throat during the day and immediately on supine at hs but then settles down and sleeps ok  Cp's = paresthesia like x 5 sec random from 0 - 10 x daily s pattern never noct  And are milder now than when prompted original eval "like water running down the front of my chest"  rec Pantoprazole (protonix) 40 mg   Take  30-60 min before first meal of the day and Pepcid (famotidine)  20 mg one @  bedtime until return to office - this is the best way to tell whether stomach acid is contributing to your problem.   GERD diet       02/22/2018  f/u ov/Nancy Christensen re: uacs sp et 02/12/18 for Lap chole / no longer on gerd rx / using peppermints / did not bring meds  Chief Complaint  Patient presents with  . Follow-up    Cough has  improved some. She has noticed cough occurs when there is a change in the temperature. Cough has occ been prod with very min clear to yellow sputum.   Dyspnea:  Not limited by breathing from desired activities   Cough: same pattern/ notes at hs but then sleeps ok, doesn't wake her up but then "morning clearing throat x sev min x about tsp  Sleep: fine  rec GERD   Pantoprazole (protonix) 40 mg   Take  30-60 min before first meal of the day and Pepcid (famotidine)  20 mg one @  bedtime until return to office - this is the best way to tell whether stomach acid is contributing to your problem.   Prednisone 10 mg take  4 each am x 2 days,   2 each am x 2 days,  1 each am x 2 days and stop  For drainage / throat tickle try take CHLORPHENIRAMINE  4 mg - take one every 4 hours   For tickle in throat> tessalon pearls 200 mg up to three times as needed (won't make you sleepy)    Please schedule a follow up office visit in 4 weeks, sooner if needed  with all medications /inhalers/ solutions in hand so we can verify exactly what you are taking. This includes all medications from all doctors and over the counters    03/23/2018  f/u ov/Nancy Christensen re: cough x 1980/ did not start 1st gen H1 blockers per guidelines   Chief Complaint  Patient presents with  . Follow-up    Cough is about the same. She is now producing some minimal white sputum.    Dyspnea:  Not limited by breathing from desired activities   Cough: throat clearing during the day  Sleeping: on side 2 pillows no noct symptoms  SABA use: none 02: none   rec Continue protonix 40 mg Take 30-60 min before first meal of the day and add pepcid 20 mg with or after supper until return  Gabapentin 100 mg three times a day with meals until return  For drainage / throat tickle try take CHLORPHENIRAMINE  4 mg - take one every 4 hours as needed - available over the counter- may cause drowsiness so start with just a bedtime dose or two and see how you tolerate it  before trying in daytime   Please schedule a follow up office visit in 4 weeks, sooner if needed  with all medications /inhalers/ solutions in hand so we can verify exactly what you are taking. This includes all medications from all doctors and over the counters    04/24/2018  f/u ov/Nancy Christensen re: MPN/   uacs x 1980 worse on acei/ still on arb  Chief Complaint  Patient presents with  . Follow-up    Cough has slightly improved. She has noticed she has trouble swallowing when she lies down.   Dyspnea:  Not limited by breathing from desired activities   Cough: daytime/ no longer noct  on h1 hs / tessilon helping daytime  Still feel has lump in throat  rec Gabapentin 100  2 x 3 times daily = 6 a day x one week and if not better take 100 x 3  X 3 times a day = 9 per day and call me with what dose works and you can tolerate Stop losartan and take cardizem 240 mg one daily     06/05/2018  f/u ov/Nancy Christensen re:  MPN/  / cough x 1980  Chief Complaint  Patient presents with  . Follow-up    Cough has improved. She occ produces some white to yellow sputum.    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 Follow-up    Cough has improved some. She is still clearing her throat some.    Dyspnea:  Not limited by breathing from desired activities   Cough: p stirs w/in 30 min  First gabapentin an hour later  Sleeping: sleeping fine flat bed one pillow  SABA use: none  02: none  Uses more h1 than tessalon seems to help  New positional R back pain x 3 weeks "like a catch" / aggravate by lifting L leg or rolling over  in certain positions noct,  seen in ER with djd changes T spine   rec Take the am gabapentin right away on rising  Bed blocks x 6-8 in head of bed  Ifwant to see Dr Joya [facility]askins at [facility] call me Please see patient coordinator before you leave today  to schedule ct chest and we'll call the results to you    07/25/2019  f/u ov/Nancy Christensen re: uacs off gabapenitn x 4 m s worsening  Chief Complaint  Patient presents with  . Follow-up    Still has some throat clearing in the am's.    Dyspnea:  Not limited by breathing from desired activities   Cough: x first 30 min q am  p stirs no worse off gabapentin Sleeping: ok SABA use: none 02: none    No obvious day to day or daytime variability or assoc excess/ purulent sputum or mucus plugs or hemoptysis or cp or chest tightness, subjective wheeze or overt sinus or hb symptoms.   Sleeping  without nocturnal  or early am exacerbation  of respiratory  c/o's or need for noct saba. Also denies any obvious fluctuation of symptoms with weather or environmental changes or other aggravating or alleviating factors except as outlined above   No unusual exposure hx or h/o childhood pna/ asthma or knowledge of premature birth.  Current Allergies, Complete Past Medical History, Past Surgical History, Family History, and Social History were reviewed in [employer] record.  ROS  The following are not active complaints unless bolded Hoarseness, sore throat, dysphagia, dental problems, itching, sneezing,  nasal congestion or discharge of excess mucus or purulent secretions, ear ache,   fever, chills, sweats, unintended wt loss or wt gain, classically pleuritic or exertional cp,  orthopnea pnd or arm/hand swelling  or leg swelling, presyncope, palpitations, abdominal pain, anorexia, nausea, vomiting, diarrhea  or change in bowel habits or change in bladder habits, change in stools or change in urine, dysuria, hematuria,  rash, arthralgias, visual  complaints, headache, numbness, weakness or ataxia or problems with walking or coordination,  change in mood or  memory.        Current Meds  Medication Sig  . BILE SALTS-CAPS-CASC-PHENOLPTH PO Take by mouth 3 (three) times daily. Once after each meal  . cholecalciferol (VITAMIN D3) 25 MCG (1000 UT) tablet Take 1,000 Units by mouth daily.  . diclofenac sodium (VOLTAREN) 1 % GEL APPLY 2 G TOPICALLY 4 (FOUR) TIMES DAILY AS NEEDED FOR KNEE PAIN  . diltiazem (CARDIZEM CD) 240 MG 24 hr capsule Take 1 capsule (240 mg total) by mouth daily.  . rosuvastatin (CRESTOR) 10 MG tablet   . vitamin B-12 (CYANOCOBALAMIN) 1000 MCG tablet Take 1,000 mcg by mouth daily.  . vitamin C (ASCORBIC ACID) 500 MG tablet Take 500 mg by mouth daily.            Objective:   Physical Exam  amb bf nad    07/25/2019     202 09/04/2018     217  06/05/2018       213  04/24/2018       218  03/23/2018       217 02/22/2018       212   01/25/18 215 lb (97.5 kg)  01/03/18 215 lb 8 oz (97.8 kg)      Vital signs reviewed - Note on arrival 02 sats  98% on RA      HEENT : pt wearing mask not removed for exam due to covid -19 concerns.  NECK :  without JVD/Nodes/TM/ nl carotid upstrokes bilaterally   LUNGS: no acc muscle use,  Nl contour chest which is clear to A and P bilaterally without cough on insp or exp maneuvers   CV:  RRR  no s3 or murmur or increase in P2, and no edema   ABD:  soft and nontender with nl inspiratory excursion in the supine position. No bruits or organomegaly appreciated, bowel sounds nl  MS:  Nl gait/ ext warm without deformities, calf tenderness, cyanosis or clubbing No obvious joint restrictions   SKIN: warm and dry without lesions    NEURO:  alert, approp, nl sensorium with  no motor or cerebellar deficits apparent.       CXR PA and Lateral:   07/25/2019 :    I personally reviewed images and agree with radiology impression as follows:   No edema or consolidation. The nodular  opacities seen on prior CT are not appreciable by radiography..  No adenopathy.          Assessment:

## 2019-07-26 ENCOUNTER — Encounter: Payer: Self-pay | Admitting: Internal Medicine

## 2019-07-26 NOTE — Assessment & Plan Note (Addendum)
Onset 1980 CT head 12/22/17 neg sinus dz FENO 01/25/2018  =   21 - Allergy profile 01/25/2018 >  Eos 0.1 /  IgE  17 RAST neg  - flare 02/12/18 p et  - gabapentin 100 mg tid 03/23/2018 > increased to max of 300 tid 04/24/2018 > improved so increased to 300 qid 06/05/2018 > d/c'd around 02/2019 s flare    Still with some sense of throat "congestion" in am but no excess mucus and resolves w/in 30 min to her satisfaction so no more w/u planned > next step would be refer to [facility]/ Dr Joya Gaskins if worsens

## 2019-07-26 NOTE — Assessment & Plan Note (Signed)
Passive smoke exp/ remote  Chest CT  09/24/17 MPN's  Largest 4 x 6 mm R laterally  > rec f/u 09/14/18 (reminder file)   As pt reports neg CT 2015 (not avail)  - ESR 01/25/2018 = 35  - CT chest 07/11/18 =  5 mm > no f/u needed   Plain cxr unchanged and no symptoms to suggest more aggressive f/u at this point  Pulmonary f/u can be prn

## 2019-07-30 ENCOUNTER — Encounter: Payer: Self-pay | Admitting: Family Medicine

## 2019-08-02 DIAGNOSIS — I8312 Varicose veins of left lower extremity with inflammation: Secondary | ICD-10-CM | POA: Diagnosis not present

## 2019-08-06 ENCOUNTER — Other Ambulatory Visit (HOSPITAL_COMMUNITY)
Admission: RE | Admit: 2019-08-06 | Discharge: 2019-08-06 | Disposition: A | Discharge status: Home / Self Care | Payer: Medicare Other | Source: Ambulatory Visit | Attending: Gastroenterology | Admitting: Gastroenterology

## 2019-08-06 DIAGNOSIS — Z20828 Contact with and (suspected) exposure to other viral communicable diseases: Secondary | ICD-10-CM | POA: Diagnosis not present

## 2019-08-06 DIAGNOSIS — Z01812 Encounter for preprocedural laboratory examination: Secondary | ICD-10-CM | POA: Insufficient documentation

## 2019-08-08 ENCOUNTER — Encounter: Payer: Self-pay | Admitting: *Deleted

## 2019-08-08 LAB — NOVEL CORONAVIRUS, NAA (HOSP ORDER, SEND-OUT TO REF LAB; TAT 18-24 HRS): SARS-CoV-2, NAA: NOT DETECTED

## 2019-08-09 ENCOUNTER — Ambulatory Visit (HOSPITAL_COMMUNITY)
Admission: RE | Admit: 2019-08-09 | Discharge: 2019-08-09 | Disposition: A | Discharge status: Home / Self Care | Payer: Medicare Other | Attending: Gastroenterology | Admitting: Gastroenterology

## 2019-08-09 ENCOUNTER — Encounter (HOSPITAL_COMMUNITY)
Admission: RE | Disposition: A | Discharge status: Home / Self Care | Payer: Self-pay | Source: Home / Self Care | Attending: Gastroenterology

## 2019-08-09 DIAGNOSIS — K5902 Outlet dysfunction constipation: Secondary | ICD-10-CM

## 2019-08-09 DIAGNOSIS — K921 Melena: Secondary | ICD-10-CM | POA: Insufficient documentation

## 2019-08-09 DIAGNOSIS — Z79899 Other long term (current) drug therapy: Secondary | ICD-10-CM | POA: Diagnosis not present

## 2019-08-09 DIAGNOSIS — K623 Rectal prolapse: Secondary | ICD-10-CM | POA: Diagnosis not present

## 2019-08-09 DIAGNOSIS — Z8249 Family history of ischemic heart disease and other diseases of the circulatory system: Secondary | ICD-10-CM | POA: Diagnosis not present

## 2019-08-09 DIAGNOSIS — R001 Bradycardia, unspecified: Secondary | ICD-10-CM | POA: Diagnosis not present

## 2019-08-09 DIAGNOSIS — Z800 Family history of malignant neoplasm of digestive organs: Secondary | ICD-10-CM | POA: Diagnosis not present

## 2019-08-09 DIAGNOSIS — Z9049 Acquired absence of other specified parts of digestive tract: Secondary | ICD-10-CM | POA: Insufficient documentation

## 2019-08-09 DIAGNOSIS — E785 Hyperlipidemia, unspecified: Secondary | ICD-10-CM | POA: Diagnosis not present

## 2019-08-09 DIAGNOSIS — I10 Essential (primary) hypertension: Secondary | ICD-10-CM | POA: Diagnosis not present

## 2019-08-09 DIAGNOSIS — K6289 Other specified diseases of anus and rectum: Secondary | ICD-10-CM

## 2019-08-09 DIAGNOSIS — K649 Unspecified hemorrhoids: Secondary | ICD-10-CM | POA: Diagnosis not present

## 2019-08-09 HISTORY — PX: ANAL RECTAL MANOMETRY: SHX6358

## 2019-08-09 SURGERY — MANOMETRY, ANORECTAL

## 2019-08-09 NOTE — H&P (Signed)
IMPRESSION:  Rectal prolapse on physical exam Intermittent rectal bleeding with pain, "walnut" at the rectum, and malodor over the last 30 days Patient reported diagnosis of hemorrhoids History of colon polyps removed in [place] 6-7 years ago    - surveillance recommended in 5-10 years Cholecystectomy 02/12/18 for cholecystitis and cholelithiasis Paternal uncle with colon cancer (no other known family history of colon cancer or polyps)    PLAN: Anorectal manometry recommended.    HPI: Nancy Christensen is a 68 y.o. 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  Referred by Dr. Volanda Napoleon for further evaluation of blood in the stool. The history is obtained through the patient and review of her electronic health record. She has hyperlipidemia, hypertension, cholecystectomy 02/12/18 for cholecystitis and cholelithiasis, and sinus bradycardia.   Noted bright red blood on 3 occasions within the last month. Some straining. Associated rectal pain and constant pressure with the need to defecate. Initially felt raw as if there could be a tear.  Odor of "dead blood"  noted on the third episode and persisted. Noted what she thought were large hemorrhoids that were the size of a walnut with the first two episodes of bleeding. With witch hazel the size has decreased. No other associated symptoms. No identified exacerbating or relieving features. No other associated symptoms. No identified exacerbating or relieving features.   History of hemorrhoids with associated pain, itching, and rare scant bleeding.   Has been taking OxBile following her cholecystectomy to keep her bowel habits regulated so that she does not become constipated.  Labs from 10/03/18 show a hemoglobin of 13.8, MCV 85.8, RDW 14.5.   Colonoscopy at least 6-7 years ago in [place], [place]. Two small polyps were removed.  Surveillance colonoscopy recommended within 5-10 years.  These results are not available to me at  this time.  No prior abdominal imaging.  Paternal ulcer with colon cancer at age 96. No other known family history of colon cancer or polyps. No family history of uterine/endometrial cancer, pancreatic cancer or gastric/stomach cancer.      Past Medical History:  Diagnosis Date  . Hyperlipidemia   . Hypertension          Past Surgical History:  Procedure Laterality Date  . ABDOMINAL HYSTERECTOMY  1997  . BREAST SURGERY  2002   left cyst removal   . CHOLECYSTECTOMY N/A 02/12/2018   Procedure: LAPAROSCOPIC CHOLECYSTECTOMY;  Surgeon: Leighton Ruff, MD;  Location: [facility];  Service: General;  Laterality: N/A;          Current Outpatient Medications  Medication Sig Dispense Refill  . BILE SALTS-CAPS-CASC-PHENOLPTH PO Take by mouth 3 (three) times daily. Once after each meal    . cholecalciferol (VITAMIN D3) 25 MCG (1000 UT) tablet Take 1,000 Units by mouth daily.    Marland Kitchen diltiazem (CARDIZEM CD) 240 MG 24 hr capsule Take 240 mg by mouth daily.    . vitamin B-12 (CYANOCOBALAMIN) 1000 MCG tablet Take 1,000 mcg by mouth daily.    . vitamin C (ASCORBIC ACID) 500 MG tablet Take 500 mg by mouth daily.     No current facility-administered medications for this visit.        Allergies as of 01/09/2019  . (No Known Allergies)         Family History  Problem Relation Age of Onset  . Diabetes Mother   . Hypertension Mother   . Thyroid disease Mother   . Vascular Disease Mother   . Lung cancer Father  Social History        Socioeconomic History  . Marital status: Divorced    Spouse name: Not on file  . Number of children: Not on file  . Years of education: Not on file  . Highest education level: Not on file  Occupational History  . Not on file  Social Needs  . Financial resource strain: Not on file  . Food insecurity:    Worry: Not on file    Inability: Not on file  . Transportation needs:    Medical: Not on file     Non-medical: Not on file  Tobacco Use  . Smoking status: Never Smoker  . Smokeless tobacco: Never Used  Substance and Sexual Activity  . Alcohol use: Never    Frequency: Never  . Drug use: Not Currently  . Sexual activity: Not on file  Lifestyle  . Physical activity:    Days per week: Not on file    Minutes per session: Not on file  . Stress: Not on file  Relationships  . Social connections:    Talks on phone: Not on file    Gets together: Not on file    Attends religious service: Not on file    Active member of club or organization: Not on file    Attends meetings of clubs or organizations: Not on file    Relationship status: Not on file  . Intimate partner violence:    Fear of current or ex partner: Not on file    Emotionally abused: Not on file    Physically abused: Not on file    Forced sexual activity: Not on file  Other Topics Concern  . Not on file  Social History Narrative  . Not on file    Review of Systems: ALL ROS discussed and all others negative except listed in HPI.  Physical Exam: General: in no acute distress Neuro: Alert and appropriate Psych: Normal affect and normal insight Exam limited due to telehealth technology.

## 2019-08-09 NOTE — Progress Notes (Signed)
Anal rectal manometry performed per protocol.  Patient tolerated well without complications.  Balloon expulsion test performed per protocol without complications.  Patient tolerated well.  Balloon expelled at 50 seconds.

## 2019-08-12 ENCOUNTER — Encounter (HOSPITAL_COMMUNITY): Payer: Self-pay | Admitting: Gastroenterology

## 2019-08-13 DIAGNOSIS — K6289 Other specified diseases of anus and rectum: Secondary | ICD-10-CM

## 2019-08-13 DIAGNOSIS — K5902 Outlet dysfunction constipation: Secondary | ICD-10-CM

## 2019-08-16 ENCOUNTER — Telehealth (INDEPENDENT_AMBULATORY_CARE_PROVIDER_SITE_OTHER): Payer: Medicare Other | Admitting: Family Medicine

## 2019-08-16 ENCOUNTER — Telehealth: Payer: Self-pay

## 2019-08-16 DIAGNOSIS — I83893 Varicose veins of bilateral lower extremities with other complications: Secondary | ICD-10-CM

## 2019-08-16 DIAGNOSIS — E785 Hyperlipidemia, unspecified: Secondary | ICD-10-CM

## 2019-08-16 MED ORDER — ROSUVASTATIN CALCIUM 10 MG PO TABS: 10.0000 mg | ORAL_TABLET | Freq: Every day | ORAL | 3 refills | Status: DC

## 2019-08-16 NOTE — Progress Notes (Signed)
Virtual Visit via Video Note  I connected with Nancy Christensen on 08/16/19 at  3:30 PM EST by a video enabled telemedicine application 2/2 [id number] pandemic and verified that I am speaking with the correct person using two identifiers.  Location patient: home Location provider:work or home office Persons participating in the virtual visit: patient, provider  I discussed the limitations of evaluation and management by telemedicine and the availability of in person appointments. The patient expressed understanding and agreed to proceed.   HPI: Pt states she has b/l varicose veins.  She had a procedure done on the L leg by a vascular clinic on [place] with good results.  Pt would like a referral to an in network vascular provider.  Pt states the veins in her R leg are painful.  Pt exercising (walking) daily, jumped rope today, using heat, compression stockings, and may take Tylenol prn.   Pt trying to work on her cholesterol naturally, but request refill on crestor.   ROS: See pertinent positives and negatives per HPI.  Past Medical History:  Diagnosis Date  . Arthritis    left hip and knee  . Hyperlipidemia   . Hypertension     Past Surgical History:  Procedure Laterality Date  . ABDOMINAL HYSTERECTOMY  1997  . ANAL RECTAL MANOMETRY N/A 08/09/2019   Procedure: ANO RECTAL MANOMETRY;  Surgeon: Thornton Park, MD;  Location: [facility];  Service: Gastroenterology;  Laterality: N/A;  . BREAST SURGERY  2002   left cyst removal   . CHOLECYSTECTOMY N/A 02/12/2018   Procedure: LAPAROSCOPIC CHOLECYSTECTOMY;  Surgeon: Leighton Ruff, MD;  Location: [facility];  Service: General;  Laterality: N/A;  . COLONOSCOPY    . POLYPECTOMY      Family History  Problem Relation Age of Onset  . Diabetes Mother   . Hypertension Mother   . Thyroid disease Mother   . Vascular Disease Mother   . Lung cancer Father   . Colon cancer Maternal Uncle   . Colon polyps Neg Hx   . Esophageal cancer Neg Hx    . Stomach cancer Neg Hx   . Rectal cancer Neg Hx   . Breast cancer Neg Hx       Current Outpatient Medications:  .  BILE SALTS-CAPS-CASC-PHENOLPTH PO, Take by mouth 3 (three) times daily. Once after each meal, Disp: , Rfl:  .  cholecalciferol (VITAMIN D3) 25 MCG (1000 UT) tablet, Take 1,000 Units by mouth daily., Disp: , Rfl:  .  diclofenac sodium (VOLTAREN) 1 % GEL, APPLY 2 G TOPICALLY 4 (FOUR) TIMES DAILY AS NEEDED FOR KNEE PAIN, Disp: , Rfl:  .  diltiazem (CARDIZEM CD) 240 MG 24 hr capsule, Take 1 capsule (240 mg total) by mouth daily., Disp: 90 capsule, Rfl: 3 .  rosuvastatin (CRESTOR) 10 MG tablet, , Disp: , Rfl:  .  vitamin B-12 (CYANOCOBALAMIN) 1000 MCG tablet, Take 1,000 mcg by mouth daily., Disp: , Rfl:  .  vitamin C (ASCORBIC ACID) 500 MG tablet, Take 500 mg by mouth daily., Disp: , Rfl:   EXAM:  VITALS per patient if applicable:  RR between 120-20 bpm  GENERAL: alert, oriented, appears well and in no acute distress  HEENT: atraumatic, conjunctiva clear, no obvious abnormalities on inspection of external nose and ears  NECK: normal movements of the head and neck  LUNGS: on inspection no signs of respiratory distress, breathing rate appears normal, no obvious gross SOB, gasping or wheezing  CV: no obvious cyanosis  MS: moves all  visible extremities without noticeable abnormality  PSYCH/NEURO: pleasant and cooperative, no obvious depression or anxiety, speech and thought processing grossly intact  ASSESSMENT AND PLAN:  Discussed the following assessment and plan:  Varicose veins of bilateral lower extremities with other complications  -Continue supportive care, TED hose/compression hose, heat, exercise, Tylenol as needed -Patient interested in having vein procedure on right leg.  Status post procedure on left leg. -Referral to vascular surgery placed - Plan: Ambulatory referral to Vascular Surgery  Dyslipidemia  -Last lipid panel 05/2019 -Patient encouraged to  continue lifestyle modifications -Consider increasing Crestor for continued cholesterol elevation - Plan: rosuvastatin (CRESTOR) 10 MG tablet   I discussed the assessment and treatment plan with the patient. The patient was provided an opportunity to ask questions and all were answered. The patient agreed with the plan and demonstrated an understanding of the instructions.   The patient was advised to call back or seek an in-person evaluation if the symptoms worsen or if the condition fails to improve as anticipated.  Billie Ruddy, MD

## 2019-08-16 NOTE — Telephone Encounter (Signed)
Pt is scheduled this afternoon to see Dr Volanda Napoleon

## 2019-08-16 NOTE — Telephone Encounter (Signed)
Pt is scheduled for a Virtual visit with Dr Volanda Napoleon this afternoon

## 2019-08-27 ENCOUNTER — Telehealth: Payer: Self-pay | Admitting: Cardiology

## 2019-08-27 NOTE — Telephone Encounter (Signed)
Called patient. Informed her we switched her appointment to virtual.

## 2019-08-27 NOTE — Telephone Encounter (Signed)
Wants to know if her Friday appt can be a virtual visit due to Covid numbers spiking  Please call patient to discuss

## 2019-08-30 ENCOUNTER — Telehealth (INDEPENDENT_AMBULATORY_CARE_PROVIDER_SITE_OTHER): Payer: Medicare Other | Admitting: Cardiology

## 2019-08-30 ENCOUNTER — Other Ambulatory Visit: Payer: Self-pay

## 2019-08-30 ENCOUNTER — Encounter: Payer: Self-pay | Admitting: Cardiology

## 2019-08-30 VITALS — BP 120/72 | Wt 197.0 lb

## 2019-08-30 DIAGNOSIS — I10 Essential (primary) hypertension: Secondary | ICD-10-CM

## 2019-08-30 DIAGNOSIS — R001 Bradycardia, unspecified: Secondary | ICD-10-CM | POA: Diagnosis not present

## 2019-08-30 DIAGNOSIS — E785 Hyperlipidemia, unspecified: Secondary | ICD-10-CM

## 2019-08-30 NOTE — Patient Instructions (Signed)
Medication Instructions:  Your physician recommends that you continue on your current medications as directed. Please refer to the Current Medication list given to you today.  *If you need a refill on your cardiac medications before your next appointment, please call your pharmacy*  Lab Work: None.  If you have labs (blood work) drawn today and your tests are completely normal, you will receive your results only by: Marland Kitchen MyChart Message (if you have MyChart) OR . A paper copy in the mail If you have any lab test that is abnormal or we need to change your treatment, we will call you to review the results.  Testing/Procedures: Your physician has recommended that you wear a holter monitor. Holter monitors are medical devices that record the heart's electrical activity. Doctors most often use these monitors to diagnose arrhythmias. Arrhythmias are problems with the speed or rhythm of the heartbeat. The monitor is a small, portable device. You can wear one while you do your normal daily activities. This is usually used to diagnose what is causing palpitations/syncope (passing out). Wear for 7 days.     Follow-Up: At [facility], you and your health needs are our priority.  As part of our continuing mission to provide you with exceptional heart care, we have created designated Provider Care Teams.  These Care Teams include your primary Cardiologist (physician) and Advanced Practice Providers (APPs -  Physician Assistants and Nurse Practitioners) who all work together to provide you with the care you need, when you need it.  Your next appointment:   5 month(s)  The format for your next appointment:   In Person  Provider:   You may see Dr. Agustin Cree or the following Advanced Practice Provider on your designated Care Team:    Laurann Montana, FNP   Other Instructions

## 2019-08-30 NOTE — Progress Notes (Signed)
Virtual Visit via Video Note   This visit type was conducted due to national recommendations for restrictions regarding the COVID-19 Pandemic (e.g. social distancing) in an effort to limit this patient's exposure and mitigate transmission in our community.  Due to her co-morbid illnesses, this patient is at least at moderate risk for complications without adequate follow up.  This format is felt to be most appropriate for this patient at this time.  All issues noted in this document were discussed and addressed.  A limited physical exam was performed with this format.  Please refer to the patient's chart for her consent to telehealth for [facility].  Evaluation Performed:  Follow-up visit  This visit type was conducted due to national recommendations for restrictions regarding the COVID-19 Pandemic (e.g. social distancing).  This format is felt to be most appropriate for this patient at this time.  All issues noted in this document were discussed and addressed.  No physical exam was performed (except for noted visual exam findings with Video Visits).  Please refer to the patient's chart (MyChart message for video visits and phone note for telephone visits) for the patient's consent to telehealth for [facility].  Date:  08/30/2019  ID: Nancy Christensen, DOB 1951/06/08, MRN [id number]   Patient Location: [place] [address]   Provider location:   [place] Office  PCP:  Billie Ruddy, MD  Cardiologist:  Jenne Campus, MD     Chief Complaint: Doing well  History of Present Illness:    Nancy Christensen is a 68 y.o. female  who presents via audio/video conferencing for a telehealth visit today.  Who was referred originally to [place] because of bradycardia.  Holter monitor was placed she does have bradycardia but not critical.  She denies having any symptoms.  There is no chest pain, no tightness, no pressure, no burning in the chest no  shortness of breath no dizziness no passing out no nightmares.  She is see he last time she had made a lot of very positive changes in her life.  She intensified her exercises.  Now she goes and walks for about hour to hour and a half 5-6 times a week she lost about 25 pounds.  She also modified her diet.  Overall she is doing very well.  Negative is the fact that she stopped Crestor for a few weeks apparently her family is prone to have some kidney dysfunction she read package inserts that that Crestor can do it and that is why she stopped however her cholesterol was rechecked her LDL is unacceptably high 199 she is back on Crestor she is scheduled to have her fasting lipid profile done in January.   The patient does not have symptoms concerning for COVID-19 infection (fever, chills, cough, or new SHORTNESS OF BREATH).    Prior CV studies:   The following studies were reviewed today:       Past Medical History:  Diagnosis Date   Arthritis    left hip and knee   Hyperlipidemia    Hypertension     Past Surgical History:  Procedure Laterality Date   ABDOMINAL HYSTERECTOMY  1997   ANAL RECTAL MANOMETRY N/A 08/09/2019   Procedure: ANO RECTAL MANOMETRY;  Surgeon: Thornton Park, MD;  Location: [facility];  Service: Gastroenterology;  Laterality: N/A;   BREAST SURGERY  2002   left cyst removal    CHOLECYSTECTOMY N/A 02/12/2018   Procedure: LAPAROSCOPIC  CHOLECYSTECTOMY;  Surgeon: Leighton Ruff, MD;  Location: [facility];  Service: General;  Laterality: N/A;   COLONOSCOPY     POLYPECTOMY       Current Meds  Medication Sig   BILE SALTS-CAPS-CASC-PHENOLPTH PO Take by mouth 3 (three) times daily. Once after each meal   cholecalciferol (VITAMIN D3) 25 MCG (1000 UT) tablet Take 1,000 Units by mouth daily.   diclofenac sodium (VOLTAREN) 1 % GEL APPLY 2 G TOPICALLY 4 (FOUR) TIMES DAILY AS NEEDED FOR KNEE PAIN   diltiazem (CARDIZEM CD) 240 MG 24 hr capsule Take 1 capsule (240  mg total) by mouth daily.   rosuvastatin (CRESTOR) 10 MG tablet Take 1 tablet (10 mg total) by mouth daily.   vitamin B-12 (CYANOCOBALAMIN) 1000 MCG tablet Take 1,000 mcg by mouth daily.   vitamin C (ASCORBIC ACID) 500 MG tablet Take 500 mg by mouth daily.      Family History: The patient's family history includes Colon cancer in her maternal uncle; Diabetes in her mother; Hypertension in her mother; Lung cancer in her father; Thyroid disease in her mother; Vascular Disease in her mother. There is no history of Colon polyps, Esophageal cancer, Stomach cancer, Rectal cancer, or Breast cancer.   ROS:   Please see the history of present illness.     All other systems reviewed and are negative.   Labs/Other Tests and Data Reviewed:     Recent Labs: 06/19/2019: BUN 13; Creatinine, Ser 1.15; Hemoglobin 14.2; Platelets 208.0; Potassium 3.8; Sodium 137  Recent Lipid Panel    Component Value Date/Time   CHOL 279 (H) 06/19/2019 0849   CHOL 272 (H) 12/04/2018 1119   TRIG 133.0 06/19/2019 0849   HDL 53.30 06/19/2019 0849   HDL 56 12/04/2018 1119   CHOLHDL 5 06/19/2019 0849   VLDL 26.6 06/19/2019 0849   LDLCALC 199 (H) 06/19/2019 0849   LDLCALC 184 (H) 12/04/2018 1119      Exam:    Vital Signs:  BP 120/72    Wt 197 lb (89.4 kg)    BMI 33.81 kg/m     Wt Readings from Last 3 Encounters:  08/30/19 197 lb (89.4 kg)  07/25/19 202 lb 12.8 oz (92 kg)  07/12/19 202 lb 12.8 oz (92 kg)     Well nourished, well developed in no acute distress. Alert awake and x3.  Talking to me over the video link she is quite cheerful and happy to be able to talk to me and very grateful for all advises I gave her.  Not in any distress at the time of interview  Diagnosis for this visit:   1. Sinus bradycardia   2. Essential hypertension   3. Dyslipidemia      ASSESSMENT & PLAN:    1.  Sinus bradycardia likely she is asymptomatic and mechanism of this bradycardia is sinus.  I will schedule her  to have a monitor done in February I will make sure she does not have more significant reason for her bradycardia like AV block.  Luckily again she does not have any symptoms. Essential hypertension blood pressure appears to be well controlled continue present management. 3.  Dyslipidemia I am really upset about the fact that she stopped taking her Crestor 4 weeks her LDL is absolutely acceptable.  She understands now that she need to take the medication she will take it in January she is scheduled to have fasting lipid profile done.  Her LDL need to be at least less than 130  and favorably less than 100.  COVID-19 Education: The signs and symptoms of COVID-19 were discussed with the patient and how to seek care for testing (follow up with PCP or arrange E-visit).  The importance of social distancing was discussed today.  Patient Risk:   After full review of this patients clinical status, I feel that they are at least moderate risk at this time.  Time:   Today, I have spent 5 minutes with the patient with telehealth technology discussing pt health issues.  I spent 20 minutes reviewing her chart before the visit.  Visit was finished at 1007.    Medication Adjustments/Labs and Tests Ordered: Current medicines are reviewed at length with the patient today.  Concerns regarding medicines are outlined above.  No orders of the defined types were placed in this encounter.  Medication changes: No orders of the defined types were placed in this encounter.    Disposition: Follow-up in 5 months  Signed, Park Liter, MD, [facility] 08/30/2019 10:05 AM    [place]

## 2019-09-03 ENCOUNTER — Other Ambulatory Visit: Payer: Self-pay | Admitting: *Deleted

## 2019-09-03 DIAGNOSIS — R278 Other lack of coordination: Secondary | ICD-10-CM

## 2019-09-04 DIAGNOSIS — M7981 Nontraumatic hematoma of soft tissue: Secondary | ICD-10-CM | POA: Diagnosis not present

## 2019-09-04 DIAGNOSIS — I8312 Varicose veins of left lower extremity with inflammation: Secondary | ICD-10-CM | POA: Diagnosis not present

## 2019-09-10 ENCOUNTER — Ambulatory Visit: Payer: Medicare Other | Admitting: Gastroenterology

## 2019-09-30 NOTE — Progress Notes (Signed)
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 History of colon polyps    - benign colonic fold on colonoscopy in [place], [place] 07/30/12    - 3 tubular adenomas on colonoscopy 07/12/19    - surveillance recommended in 3 years Cholecystectomy 02/12/18 for cholecystitis and cholelithiasis    - taking oxbile Family history of colon cancer (paternal uncle in his 62s, cousin in 22s)  53 local therapy. Proceed with pelvic floor PT. Surgical referral to determine surgical candidacy  Reviewed colonoscopy pathology.  Surveillance recommended in 3 years.  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  Originally seen in consultation 01/09/2019 for evaluation of blood in the stool.  Colonoscopy was performed 07/12/2019.  Anal rectal manometry was performed 08/09/2019.  She returns now in scheduled follow-up.  The interval history is obtained through the patient and review of her electronic health record. She has hyperlipidemia, hypertension, cholecystectomy 02/12/18 for cholecystitis and cholelithiasis, and sinus bradycardia.   History of hemorrhoids with associated pain, itching, and rare scant bleeding.  At the time of her consultation she was noting more bright red blood and rectal pain with a constant pressure with the need to  defecate.  Using witch hazel to minimize her symptoms.  Has been taking OxBile following her cholecystectomy to keep her bowel habits regulated so that she does not become constipated.  Colonoscopy 07/12/19: Rectal prolapse, melanosis coli, pancolonic diverticulosis, 3 tubular adenomas (ascending colon, hepatic flexure, sigmoid colon)  Rectal manometry was performed 08/09/19: Elevated anal sphincter resting and squeeze pressures, incomplete relaxation of the anal sphincter with elevated intrarectal pressure during attempted defecation, normal balloon expulsion, normal rectal sensation, and normal rectoanal inhibitory reflux.  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  Will see pelvic floor PT in consultation. in 2 weeks.   No bleeding until this week when she developed BRBPR. Sense of incomplete evacuation and a constant pressure at her anus with the need to defecate.  No new symptoms at this time.  Mother and grandmother with troublesome hemorrhoids.   Endoscopic history: Records from [place]: cousin (63s) and uncle (41s) with CRC.  Colonoscopy 07/30/2012 with Dr. Gershon Mussel: polyp, internal hemorrhoids, diverticulosis- benign colonic fold Colonoscopy 07/12/19: Rectal prolapse, melanosis coli, pancolonic diverticulosis, 3 tubular adenomas (ascending colon, hepatic flexure, sigmoid colon)  Labs from 10/03/18 show a hemoglobin of 13.8, MCV 85.8, RDW 14.5.   Abdominal ultrasound 02/11/2018 showed multiple gallstones and suggested acute cholecystitis   Past Medical History:  Diagnosis Date  . Arthritis    left hip and knee  . Hyperlipidemia   . Hypertension     Past Surgical History:  Procedure Laterality Date  . ABDOMINAL HYSTERECTOMY  1997  . ANAL RECTAL MANOMETRY N/A 08/09/2019   Procedure: ANO RECTAL MANOMETRY;  Surgeon: Thornton Park, MD;  [facility];  Service: Gastroenterology;   Laterality:  N/A;  . BREAST SURGERY  2002   left cyst removal   . CHOLECYSTECTOMY N/A 02/12/2018   Procedure: LAPAROSCOPIC CHOLECYSTECTOMY;  Surgeon: Leighton Ruff, MD;  Location: [facility];  Service: General;  Laterality: N/A;  . COLONOSCOPY    . POLYPECTOMY      Current Outpatient Medications  Medication Sig Dispense Refill  . BILE SALTS-CAPS-CASC-PHENOLPTH PO Take by mouth 3 (three) times daily. Once after each meal    . cholecalciferol (VITAMIN D3) 25 MCG (1000 UT) tablet Take 1,000 Units by mouth daily.    . diclofenac sodium (VOLTAREN) 1 % GEL APPLY 2 G TOPICALLY 4 (FOUR) TIMES DAILY AS NEEDED FOR KNEE PAIN    . diltiazem (CARDIZEM CD) 240 MG 24 hr capsule Take 1 capsule (240 mg total) by mouth daily. 90 capsule 3  . rosuvastatin (CRESTOR) 10 MG tablet Take 1 tablet (10 mg total) by mouth daily. 90 tablet 3  . vitamin B-12 (CYANOCOBALAMIN) 1000 MCG tablet Take 1,000 mcg by mouth daily.    . vitamin C (ASCORBIC ACID) 500 MG tablet Take 500 mg by mouth daily.     No current facility-administered medications for this visit.    Allergies as of 10/01/2019  . (No Known Allergies)    Family History  Problem Relation Age of Onset  . Diabetes Mother   . Hypertension Mother   . Thyroid disease Mother   . Vascular Disease Mother   . Lung cancer Father   . Colon cancer Maternal Uncle   . Colon polyps Neg Hx   . Esophageal cancer Neg Hx   . Stomach cancer Neg Hx   . Rectal cancer Neg Hx   . Breast cancer Neg Hx     Social History   Socioeconomic History  . Marital status: Divorced    Spouse name: Not on file  . Number of children: Not on file  . Years of education: Not on file  . Highest education level: Not on file  Occupational History  . Not on file  Tobacco Use  . Smoking status: Never Smoker  . Smokeless tobacco: Never Used  Substance and Sexual Activity  . Alcohol use: Never  . Drug use: Not Currently  . Sexual activity: Not on file  Other Topics Concern  . Not  on file  Social History Narrative  . Not on file   Social Determinants of Health   Financial Resource Strain:   . Difficulty of Paying Living Expenses: Not on file  Food Insecurity:   . Worried About Charity fundraiser in the Last Year: Not on file  . Ran Out of Food in the Last Year: Not on file  Transportation Needs:   . Lack of Transportation (Medical): Not on file  . Lack of Transportation (Non-Medical): Not on file  Physical Activity:   . Days of Exercise per Week: Not on file  . Minutes of Exercise per Session: Not on file  Stress:   . Feeling of Stress : Not on file  Social Connections:   . Frequency of Communication with Friends and Family: Not on file  . Frequency of Social Gatherings with Friends and Family: Not on file  . Attends Religious Services: Not on file  . Active Member of Clubs or Organizations: Not on file  . Attends Archivist Meetings: Not on file  . Marital Status: Not on file  Intimate Partner Violence:   . Fear of Current or Ex-Partner: Not on file  . Emotionally  Abused: Not on file  . Physically Abused: Not on file  . Sexually Abused: Not on file     Physical Exam: General: Pleasant, well-nourished, in no acute distress, appears her stated age Neuro: Alert and appropriate Psych: Normal affect and normal insight   Nancy Chamblee L. Tarri Glenn, MD, MPH [facility] 09/30/2019, 7:18 PM

## 2019-10-01 ENCOUNTER — Encounter: Payer: Self-pay | Admitting: Gastroenterology

## 2019-10-01 ENCOUNTER — Ambulatory Visit (INDEPENDENT_AMBULATORY_CARE_PROVIDER_SITE_OTHER): Payer: Medicare Other | Admitting: Gastroenterology

## 2019-10-01 VITALS — BP 130/70 | HR 68 | Temp 98.4°F | Ht 64.0 in | Wt 205.5 lb

## 2019-10-01 DIAGNOSIS — K623 Rectal prolapse: Secondary | ICD-10-CM

## 2019-10-01 DIAGNOSIS — M6289 Other specified disorders of muscle: Secondary | ICD-10-CM | POA: Diagnosis not present

## 2019-10-01 MED ORDER — HYDROCORTISONE ACETATE 25 MG RE SUPP: 25.0000 mg | Freq: Two times a day (BID) | RECTAL | 0 refills | Status: DC

## 2019-10-01 NOTE — Patient Instructions (Addendum)
High fiber diet recommended.  Please drink at least 1.5-2 liters of water every day.  I recommend adding a daily stool bulking agent with psyllium or methylcellulose such as Benefiber, Metamucil, or Citrucel.  I have prescribed  Anusol HC 2.5% applied sparingly to your rectum twice daily.   Sitz Baths may also provide some additional relief.  If you would like more information, Mygihealth.com and [email or web address].[email or web address] have good information about hemorrhoids.  You should have another colonoscopy in 2023 given the 3 polyps that were removed in 2020.   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   Fiber Chart  You should be consuming 25-30g of fiber per day and drinking 8 glasses of water to help your bowels move regularly.  In the chart below you can look up how much fiber you are getting in an average day.  If you are not getting enough fiber, you should add a fiber supplement to your diet.  Examples of this include Metamucil, FiberCon and Citrucel.  These can be purchased at your local grocery store or pharmacy.      [email or web address]-[email or web address]

## 2019-10-02 ENCOUNTER — Telehealth: Payer: Self-pay | Admitting: *Deleted

## 2019-10-02 NOTE — Telephone Encounter (Signed)
-----   Message from Thornton Park, MD sent at 10/01/2019  4:46 PM EST ----- Please call Nancy Christensen.  I have been thinking about her since her recent office visit.  Given her rectal prolapse and ongoing symptoms I recommend surgical consultation to see if she might be a candidate for surgical repair.  If she is in agreement, please process a referral to Dr. Nadeen Landau.  Thank you.

## 2019-10-02 NOTE — Telephone Encounter (Signed)
Spoke to the patient who wanted a referral to [facility] as Dr. Tarri Glenn recommended.   Referral faxed. Will follow up with [facility] in 30 days to ensure patient has been scheduled.

## 2019-10-15 ENCOUNTER — Encounter: Payer: Self-pay | Admitting: Family Medicine

## 2019-10-15 ENCOUNTER — Ambulatory Visit (INDEPENDENT_AMBULATORY_CARE_PROVIDER_SITE_OTHER): Payer: Medicare Other | Admitting: Family Medicine

## 2019-10-15 ENCOUNTER — Other Ambulatory Visit: Payer: Self-pay

## 2019-10-15 ENCOUNTER — Ambulatory Visit (INDEPENDENT_AMBULATORY_CARE_PROVIDER_SITE_OTHER): Payer: Medicare Other

## 2019-10-15 VITALS — BP 136/80 | HR 55 | Temp 95.1°F | Resp 16 | Ht 64.0 in | Wt 208.0 lb

## 2019-10-15 DIAGNOSIS — R001 Bradycardia, unspecified: Secondary | ICD-10-CM | POA: Diagnosis not present

## 2019-10-15 DIAGNOSIS — M545 Low back pain, unspecified: Secondary | ICD-10-CM

## 2019-10-15 DIAGNOSIS — R350 Frequency of micturition: Secondary | ICD-10-CM | POA: Diagnosis not present

## 2019-10-15 LAB — URINALYSIS, ROUTINE W REFLEX MICROSCOPIC
Bilirubin Urine: NEGATIVE
Hgb urine dipstick: NEGATIVE
Ketones, ur: NEGATIVE
Leukocytes,Ua: NEGATIVE
Nitrite: NEGATIVE
RBC / HPF: NONE SEEN
Specific Gravity, Urine: 1.02 (ref 1.000–1.030)
Total Protein, Urine: NEGATIVE
Urine Glucose: NEGATIVE
Urobilinogen, UA: 0.2 (ref 0.0–1.0)
pH: 6 (ref 5.0–8.0)

## 2019-10-15 MED ORDER — TIZANIDINE HCL 4 MG PO TABS: 4.0000 mg | ORAL_TABLET | Freq: Two times a day (BID) | ORAL | 0 refills | Status: AC | PRN

## 2019-10-15 NOTE — Progress Notes (Signed)
ACUTE VISIT   HPI:  Chief Complaint  Patient presents with  . pain on right side of back    Nancy Christensen is a 69 y.o. female, who is here today complaining of 2 to 3 days of intermittent right-sided lower back pain, sudden onset. Gripping-like pain, today 6/10, yesterday was 7/10.  Negative for recent injury or unusual level of activity.  Pain is not radiated. No associated LE numbness, tingling, urinary incontinence or retention, stool incontinence, or saddle anesthesia.  Exacerbated by movement. Alleviated by rest. No rash or edema on area, fever, chills, or abnormal wt loss.  Prior Hx of back pain: She has some back pain many years ago but does not remember having pain as she describes it today.   OTC medications: Tylenol last night.  She has urinary frequency, she goes every hour. This is not a new problem. Denies pelvic pain, dysuria, gross hematuria, or urinary incontinence. + Urine frequency.  Noted mild bradycardia. She states that her HR has been low before. Currently she is on diltiazem 240 mg daily to treat hypertension. She denies CP, palpitation, dyspnea, or diaphoresis.   Review of Systems  Constitutional: Negative for activity change, appetite change and fatigue.  Respiratory: Negative for cough and wheezing.   Cardiovascular: Negative for leg swelling.  Gastrointestinal: Negative for abdominal pain, blood in stool, nausea and vomiting.       Negative for changes in bowel habits.  Endocrine: Negative for polydipsia, polyphagia and polyuria.  Genitourinary: Negative for vaginal bleeding and vaginal discharge.  Skin: Negative for rash.  Neurological: Negative for weakness and headaches.  Rest see pertinent positives and negatives per HPI.   Current Outpatient Medications on File Prior to Visit  Medication Sig Dispense Refill  . BILE SALTS-CAPS-CASC-PHENOLPTH PO Take by mouth 3 (three) times daily. Once after each meal    .  cholecalciferol (VITAMIN D3) 25 MCG (1000 UT) tablet Take 1,000 Units by mouth daily.    . diclofenac sodium (VOLTAREN) 1 % GEL APPLY 2 G TOPICALLY 4 (FOUR) TIMES DAILY AS NEEDED FOR KNEE PAIN    . diltiazem (CARDIZEM CD) 240 MG 24 hr capsule Take 1 capsule (240 mg total) by mouth daily. 90 capsule 3  . hydrocortisone (ANUSOL-HC) 25 MG suppository Place 1 suppository (25 mg total) rectally 2 (two) times daily. 12 suppository 0  . rosuvastatin (CRESTOR) 10 MG tablet Take 1 tablet (10 mg total) by mouth daily. 90 tablet 3  . vitamin B-12 (CYANOCOBALAMIN) 1000 MCG tablet Take 1,000 mcg by mouth daily.    . vitamin C (ASCORBIC ACID) 500 MG tablet Take 500 mg by mouth daily.     No current facility-administered medications on file prior to visit.    Past Medical History:  Diagnosis Date  . Arthritis    left hip and knee  . Hyperlipidemia   . Hypertension    No Known Allergies  Social History   Socioeconomic History  . Marital status: Divorced    Spouse name: Not on file  . Number of children: Not on file  . Years of education: Not on file  . Highest education level: Not on file  Occupational History  . Not on file  Tobacco Use  . Smoking status: Never Smoker  . Smokeless tobacco: Never Used  Substance and Sexual Activity  . Alcohol use: Never  . Drug use: Not Currently  . Sexual activity: Not on file  Other Topics Concern  . Not on  file  Social History Narrative  . Not on file   Social Determinants of Health   Financial Resource Strain:   . Difficulty of Paying Living Expenses: Not on file  Food Insecurity:   . Worried About Charity fundraiser in the Last Year: Not on file  . Ran Out of Food in the Last Year: Not on file  Transportation Needs:   . Lack of Transportation (Medical): Not on file  . Lack of Transportation (Non-Medical): Not on file  Physical Activity:   . Days of Exercise per Week: Not on file  . Minutes of Exercise per Session: Not on file  Stress:     . Feeling of Stress : Not on file  Social Connections:   . Frequency of Communication with Friends and Family: Not on file  . Frequency of Social Gatherings with Friends and Family: Not on file  . Attends Religious Services: Not on file  . Active Member of Clubs or Organizations: Not on file  . Attends Archivist Meetings: Not on file  . Marital Status: Not on file    Vitals:   10/15/19 1002  BP: 136/80  Pulse: (!) 55  Resp: 16  Temp: (!) 95.1 F (35.1 C)  SpO2: 98%   Body mass index is 35.7 kg/m.  Physical Exam  Nursing note and vitals reviewed. Constitutional: She is oriented to person, place, and time. She appears well-developed. She does not appear ill. No distress.  HENT:  Head: Atraumatic.  Eyes: Conjunctivae are normal.  Cardiovascular: Regular rhythm. Bradycardia present.  No murmur heard. Pulses:      Dorsalis pedis pulses are 2+ on the right side and 2+ on the left side.  Respiratory: Effort normal and breath sounds normal. No respiratory distress.  GI: Soft. She exhibits no mass. There is no hepatomegaly. There is no abdominal tenderness.  Musculoskeletal:        General: Edema (Trace pitting LE edema,bilateral.) present.     Thoracic back: No tenderness or bony tenderness.     Lumbar back: Spasms and tenderness present. No bony tenderness. Decreased range of motion.       Back:     Comments: No significant deformity appreciated. + tenderness upon palpation of right-sided lumbar paraspinal muscles. Pain elicited with movement on exam table during examination. No local edema or erythema appreciated, no suspicious lesions.  Lymphadenopathy:       Right: No supraclavicular adenopathy present.       Left: No supraclavicular adenopathy present.  Neurological: She is alert and oriented to person, place, and time. She has normal strength.  Reflex Scores:      Patellar reflexes are 2+ on the right side and 2+ on the left side. SLR negative  bilateral. Antalgic gait.  Skin: Skin is warm. No rash noted. No erythema.  Psychiatric: She has a normal mood and affect.  Well groomed, good eye contact.   ASSESSMENT AND PLAN:  Nancy Christensen was seen today for pain on right side of back.  Diagnoses and all orders for this visit:  Acute right-sided low back pain without sciatica Muscular most likely. We discussed other possible etiologies. Monitor for new symptoms, including rash. Over-the-counter IcyHot patch or topical similar medication may help. Continue with daily activities as tolerated. She would like imaging done today. We reviewed some side effects of muscle relaxants, fall prevention discussed. Further recommendation will be given according to lumbar imaging results.  -     tiZANidine (ZANAFLEX) 4  MG tablet; Take 1 tablet (4 mg total) by mouth 2 (two) times daily as needed for muscle spasms. -     DG Lumbar Spine Complete  Urinary frequency Urine frequency seems to be chronic. I do not think this is related with her back pain. We discussed possible etiologies,?  Overactive bladder. Further recommendation will be given according to UA results.  -     Urinalysis, Routine w reflex microscopic  Sinus bradycardia Reporting HR as low as mid 40s. We discussed some side effects of diltiazem. Instructed to monitor HR at home. Instructed about warning signs.   Return if symptoms worsen or fail to improve.   Shalayne Leach G. [place], MD  [facility]. [place] office.

## 2019-10-15 NOTE — Patient Instructions (Signed)
A few things to remember from today's visit:   Acute right-sided low back pain without sciatica - Plan: tiZANidine (ZANAFLEX) 4 MG tablet  Urinary frequency - Plan: Urinalysis, Routine w reflex microscopic  Sinus bradycardia  IcyHot patch may help. Move around the house as tolerated. Monitor for new symptoms like rash, fever, or lower extremity numbness among some. You can take Tylenol 500 mg 3-4 times per day. Zanaflex is a muscle relaxant that can cause drowsiness, you can take it twice daily, be careful with falls.  Diltiazem can aggravate your low heart rate. Continue monitoring heart rate at home.  Please be sure medication list is accurate. If a new problem present, please set up appointment sooner than planned today.

## 2019-10-21 ENCOUNTER — Encounter: Payer: Self-pay | Admitting: Family Medicine

## 2019-10-22 ENCOUNTER — Other Ambulatory Visit: Payer: Self-pay

## 2019-10-22 ENCOUNTER — Encounter: Payer: Self-pay | Admitting: Family Medicine

## 2019-10-22 ENCOUNTER — Encounter: Payer: Self-pay | Admitting: Physical Therapy

## 2019-10-22 ENCOUNTER — Ambulatory Visit: Payer: Medicare Other | Attending: Gastroenterology | Admitting: Physical Therapy

## 2019-10-22 DIAGNOSIS — K623 Rectal prolapse: Secondary | ICD-10-CM | POA: Diagnosis not present

## 2019-10-22 DIAGNOSIS — R278 Other lack of coordination: Secondary | ICD-10-CM | POA: Diagnosis not present

## 2019-10-22 DIAGNOSIS — M6281 Muscle weakness (generalized): Secondary | ICD-10-CM | POA: Diagnosis not present

## 2019-10-22 DIAGNOSIS — R279 Unspecified lack of coordination: Secondary | ICD-10-CM

## 2019-10-22 NOTE — Therapy (Signed)
[facility] [address], [place], [place], [address] Phone: [phone]   Fax:  [phone]  Physical Therapy Evaluation  Patient Details  Name: Nancy Christensen MRN: [id number] Date of Birth: 11-Aug-1951 Referring Provider (PT): Dr. Thornton Park   Encounter Date: 10/22/2019  PT End of Session - 10/22/19 1714    Visit Number  1    Date for PT Re-Evaluation  12/17/19    PT Start Time  1530    PT Stop Time  1610    PT Time Calculation (min)  40 min    Activity Tolerance  Patient tolerated treatment well    Behavior During Therapy  [facility] for tasks assessed/performed       Past Medical History:  Diagnosis Date  . Arthritis    left hip and knee  . Hyperlipidemia   . Hypertension     Past Surgical History:  Procedure Laterality Date  . ABDOMINAL HYSTERECTOMY  1997  . ANAL RECTAL MANOMETRY N/A 08/09/2019   Procedure: ANO RECTAL MANOMETRY;  Surgeon: Thornton Park, MD;  Location: [facility];  Service: Gastroenterology;  Laterality: N/A;  . BREAST SURGERY  2002   left cyst removal   . CHOLECYSTECTOMY N/A 02/12/2018   Procedure: LAPAROSCOPIC CHOLECYSTECTOMY;  Surgeon: Leighton Ruff, MD;  Location: [facility];  Service: General;  Laterality: N/A;  . COLONOSCOPY    . POLYPECTOMY      There were no vitals filed for this visit.   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 Patient started to notice bleeding in the rectal area last April. Patient had to strain to get the bowel movement out. Patient had a colonoscopy and found out she had a rectal prolapse. I am to taking metamucil daily. Patient has 2 bowel movements per day. If constipated it is rocks. When have a normal bowel movement the stool is long.    Patient Stated Goals  Learn how to properly eliminate stool, reduce constipation, strengthen pelvic floor muscles    Currently in Pain?  No/denies          [facility] PT Assessment - 10/22/19 0001      Assessment   Medical Diagnosis  R27.8 Dyssynergia    Referring Provider (PT)  Dr. Thornton Park    Onset Date/Surgical Date  --   chronic   Prior Therapy  none      Precautions   Precautions  None      Restrictions   Weight Bearing Restrictions  No      Balance Screen   Has the patient fallen in the past 6 months  No    Has the patient had a decrease in activity level because of a fear of falling?   No    Is the patient reluctant to leave their home because of a fear of falling?   No      Home Film/video editor residence      Prior Function   Level of Independence  Independent    Vocation  Retired    Leisure  walking 2 miles per day       Cognition   Overall Cognitive Status  Within Functional Limits for tasks assessed      Posture/Postural Control   Posture/Postural Control  No significant limitations      ROM / Strength   AROM / PROM / Strength  Strength  Strength   Right Hip Flexion  4/5    Right Hip Extension  4/5    Right Hip ABduction  3/5    Left Hip Flexion  4/5    Left Hip Extension  4/5    Left Hip ABduction  3/5      Palpation   Palpation comment  tender and tightness inthe right upper quadrant                Objective measurements completed on examination: See above findings.    Pelvic Floor Special Questions - 10/22/19 0001    Prior Pregnancies  Yes    Number of Pregnancies  1    Number of Vaginal Deliveries  1    Urinary Leakage  No    Fecal incontinence  Yes   saw a spot on her underwear   Skin Integrity  Hemorroids   prolapse   Pelvic Floor Internal Exam  Patient confirms identification and approves PT to assess pelvic floor and treatment    Exam Type  Rectal    Palpation  tenderness located on bil. obturator internist, puborectalis, able to push therapist finger out when she bulged her belly    Strength  weak squeeze, no lift                  PT Short Term Goals - 10/22/19 1709      PT SHORT TERM GOAL #1   Title  independent with initial HEP    Time  4    Period  Weeks    Status  New    Target Date  11/19/19      PT SHORT TERM GOAL #2   Title  education on abdominal massage to improve peristalic motion of intestines to reduce constipation    Time  4    Period  Weeks    Status  New    Target Date  11/19/19      PT SHORT TERM GOAL #3   Title  education on correct toileting technique to reduce the strain and decrease pressure on prolapse    Time  4    Period  Weeks    Status  New    Target Date  11/19/19        PT Long Term Goals - 10/22/19 1606      PT LONG TERM GOAL #1   Title  independent with advanced HEP    Time  8    Period  Weeks    Status  New    Target Date  12/17/19      PT LONG TERM GOAL #2   Title  reduction of straining to have a bowel movement >/= 75%    Time  8    Period  Weeks    Status  New    Target Date  12/17/19      PT LONG TERM GOAL #3   Title  ability to sit without feeling her rectum is prolapsed due to being reduced >/= 50% of the time    Time  8    Period  Weeks    Status  New    Target Date  12/17/19      PT LONG TERM GOAL #4   Title  able to completely elimenate the stool 75% of the time due to improved pelvic floor coordination    Time  8    Period  Weeks    Status  New    Target Date  12/17/19             Plan - 10/22/19 1610    Clinical Impression Statement  Patient is a 69 year old female with constipation and rectal prolpase. Patient reports she gets a full feeling when she sits due to the prolapse. Patient pelvic floor strengthis 2/5 with a circular contraction but no lift. Patient  was able to push the therapist finger out of the rectum when she was able to make her belly big and bear down. Patient has tenderness located in bilateral obturator internist and thickness in the puborectalis. Patient has weak hips. Patient has tightness  and tenderness located in right upper quadrant. Patient will benefit from skilled therapy to improve pelvic floor coordinaton to reduce the straining on the pelvic floor.    Personal Factors and Comorbidities  Age;Comorbidity 2;Fitness    Comorbidities  Abdominal hysterectomy; rectal prolapse    Examination-Activity Limitations  Sit;Toileting    Examination-Participation Restrictions  Driving    Clinical Decision Making  Moderate    Rehab Potential  Good    PT Frequency  1x / week    PT Duration  8 weeks    PT Treatment/Interventions  Biofeedback;Therapeutic activities;Therapeutic exercise;Neuromuscular re-education;Manual techniques;Patient/family education;Dry needling    PT Next Visit Plan  toileting technique, hip stretches, bowel health, internal work on the anal area, and pelvic floor contraction    Consulted and Agree with Plan of Care  Patient       Patient will benefit from skilled therapeutic intervention in order to improve the following deficits and impairments:  Decreased coordination, Decreased range of motion, Increased fascial restricitons, Increased muscle spasms, Decreased activity tolerance, Decreased strength  Visit Diagnosis: Muscle weakness (generalized) - Plan: PT plan of care cert/re-cert  Unspecified lack of coordination - Plan: PT plan of care cert/re-cert  Dyssynergia - Plan: PT plan of care cert/re-cert  Rectal prolapse - Plan: PT plan of care cert/re-cert     Problem List Patient Active Problem List   Diagnosis Date Noted  . 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 Rectal pain   . Dizziness 10/25/2018  . Sinus bradycardia 10/25/2018  . Atypical chest pain 10/25/2018  . Dyslipidemia 10/25/2018  . Chronic right shoulder pain 07/04/2018  . Unilateral primary osteoarthritis, left knee 06/14/2018  . Essential hypertension 04/28/2018  . Hypertensive disorder 04/18/2018  . Cholecystitis with cholelithiasis 02/11/2018  . Multiple pulmonary nodules  determined by computed tomography of lung 01/26/2018  . Upper airway cough syndrome 01/25/2018    Nancy Christensen 10/22/2019, 5:15 PM  [facility] [address], [place], [place], [address] Phone: [phone]   Fax:  [phone]  Name: Nancy Christensen MRN: [id number] Date of Birth: Nov 28, 1950

## 2019-10-25 ENCOUNTER — Telehealth: Payer: Self-pay | Admitting: *Deleted

## 2019-10-25 NOTE — Telephone Encounter (Signed)
Followed up with [facility], patient scheduled with Dr. Dema Severin on 2/15 at 4 pm.

## 2019-10-29 ENCOUNTER — Other Ambulatory Visit: Payer: Self-pay

## 2019-10-29 ENCOUNTER — Ambulatory Visit: Payer: Medicare Other | Attending: Gastroenterology | Admitting: Physical Therapy

## 2019-10-29 ENCOUNTER — Encounter: Payer: Self-pay | Admitting: Physical Therapy

## 2019-10-29 DIAGNOSIS — M6281 Muscle weakness (generalized): Secondary | ICD-10-CM

## 2019-10-29 DIAGNOSIS — K623 Rectal prolapse: Secondary | ICD-10-CM

## 2019-10-29 DIAGNOSIS — R278 Other lack of coordination: Secondary | ICD-10-CM

## 2019-10-29 DIAGNOSIS — R279 Unspecified lack of coordination: Secondary | ICD-10-CM

## 2019-10-29 DIAGNOSIS — I8312 Varicose veins of left lower extremity with inflammation: Secondary | ICD-10-CM | POA: Diagnosis not present

## 2019-10-29 NOTE — Therapy (Signed)
[facility] [address] [place], [place], [address] Phone: [phone]   Fax:  [phone]  Physical Therapy Treatment  Patient Details  Name: Nancy Christensen MRN: [id number] Date of Birth: 01-16-1951 Referring Provider (PT): Dr. Thornton Park   Encounter Date: 10/29/2019  PT End of Session - 10/29/19 1152    Visit Number  2    Date for PT Re-Evaluation  12/17/19    Authorization Type  Medicare BCBS federal    PT Start Time  1145    PT Stop Time  1225    PT Time Calculation (min)  40 min    Activity Tolerance  Patient tolerated treatment well    Behavior During Therapy  [facility] for tasks assessed/performed       Past Medical History:  Diagnosis Date  . Arthritis    left hip and knee  . Hyperlipidemia   . Hypertension     Past Surgical History:  Procedure Laterality Date  . ABDOMINAL HYSTERECTOMY  1997  . ANAL RECTAL MANOMETRY N/A 08/09/2019   Procedure: ANO RECTAL MANOMETRY;  Surgeon: Thornton Park, MD;  Location: [facility];  Service: Gastroenterology;  Laterality: N/A;  . BREAST SURGERY  2002   left cyst removal   . CHOLECYSTECTOMY N/A 02/12/2018   Procedure: LAPAROSCOPIC CHOLECYSTECTOMY;  Surgeon: Leighton Ruff, MD;  Location: [facility];  Service: General;  Laterality: N/A;  . COLONOSCOPY    . POLYPECTOMY      There were no vitals filed for this visit.  Subjective Assessment - 10/29/19 1150    Subjective  I felt tender in the rectum after the exam. Patient reports she still has tenderness located in the rectum. Bowel movements are more consitent with the metamucil. I have had one day with straining but not as bad.  I am scheduled to see  MD 2/15.    Patient Stated Goals  Learn how to properly eliminate stool, reduce constipation, strengthen pelvic floor muscles    Currently in Pain?  No/denies    Multiple Pain Sites  No                       [facility] Adult PT Treatment/Exercise  - 10/29/19 0001      Self-Care   Self-Care  Other Self-Care Comments    Other Self-Care Comments   education on bowel health       Therapeutic Activites    Therapeutic Activities  Other Therapeutic Activities    Other Therapeutic Activities  Instructed patient on toileting technique to relax the pelvic floor, breathing correctly, and how to bear down without holding breath      Lumbar Exercises: Stretches   Active Hamstring Stretch  Right;Left;1 rep;30 seconds    Active Hamstring Stretch Limitations  sitting    Double Knee to Chest Stretch  1 rep;30 seconds    Lower Trunk Rotation  2 reps;30 seconds    Lower Trunk Rotation Limitations  each side    Press Ups  5 reps    Quadruped Mid Back Stretch  1 rep;30 seconds    Piriformis Stretch  Right;Left;1 rep;30 seconds    Piriformis Stretch Limitations  sitting    Other Lumbar Stretch Exercise  butterfly stretch      Lumbar Exercises: Quadruped   Madcat/Old Horse  15 reps      Manual Therapy   Manual Therapy  Soft tissue mobilization    Soft tissue mobilization  abominal massage to promote  peristalic motion of the large intestines to promote bowel movements             PT Education - 10/29/19 1218    Education Details  Access Code: [email or web address]; abdominal massage, toileting; bowel health    Person(s) Educated  Patient    Methods  Explanation;Demonstration;Handout;Tactile cues    Comprehension  Returned demonstration;Verbalized understanding       PT Short Term Goals - 10/22/19 1709      PT SHORT TERM GOAL #1   Title  independent with initial HEP    Time  4    Period  Weeks    Status  New    Target Date  11/19/19      PT SHORT TERM GOAL #2   Title  education on abdominal massage to improve peristalic motion of intestines to reduce constipation    Time  4    Period  Weeks    Status  New    Target Date  11/19/19      PT SHORT TERM GOAL #3   Title  education on correct toileting technique to reduce the strain and  decrease pressure on prolapse    Time  4    Period  Weeks    Status  New    Target Date  11/19/19        PT Long Term Goals - 10/22/19 1606      PT LONG TERM GOAL #1   Title  independent with advanced HEP    Time  8    Period  Weeks    Status  New    Target Date  12/17/19      PT LONG TERM GOAL #2   Title  reduction of straining to have a bowel movement >/= 75%    Time  8    Period  Weeks    Status  New    Target Date  12/17/19      PT LONG TERM GOAL #3   Title  ability to sit without feeling her rectum is prolapsed due to being reduced >/= 50% of the time    Time  8    Period  Weeks    Status  New    Target Date  12/17/19      PT LONG TERM GOAL #4   Title  able to completely elimenate the stool 75% of the time due to improved pelvic floor coordination    Time  8    Period  Weeks    Status  New    Target Date  12/17/19            Plan - 10/29/19 1152    Clinical Impression Statement  Patient reports she only had to strain one time. Patient understands bowel health, how to toilet correctly, abodminal massage and hip stretches. Patient has not met goals due to just starting therapy. Patient had some soreness in the rectum. Patient will benefit from skilled therapy to improve pelvic floor coordination to reduce the straining of the pelvic floor.    Personal Factors and Comorbidities  Age;Comorbidity 2;Fitness    Comorbidities  Abdominal hysterectomy; rectal prolapse    Examination-Activity Limitations  Sit;Toileting    Rehab Potential  Good    PT Frequency  1x / week    PT Duration  8 weeks    PT Treatment/Interventions  Biofeedback;Therapeutic activities;Therapeutic exercise;Neuromuscular re-education;Manual techniques;Patient/family education;Dry needling    PT Next Visit Plan  internal work on the anal area,  and pelvic floor contraction, elongation of the pelvci floor with relaxation    Recommended Other Services  MD signed intial eval    Consulted and Agree  with Plan of Care  Patient       Patient will benefit from skilled therapeutic intervention in order to improve the following deficits and impairments:  Decreased coordination, Decreased range of motion, Increased fascial restricitons, Increased muscle spasms, Decreased activity tolerance, Decreased strength  Visit Diagnosis: Unspecified lack of coordination  Muscle weakness (generalized)  Dyssynergia  Rectal prolapse     Problem List Patient Active Problem List   Diagnosis Date Noted  . 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 Rectal pain   . Dizziness 10/25/2018  . Sinus bradycardia 10/25/2018  . Atypical chest pain 10/25/2018  . Dyslipidemia 10/25/2018  . Chronic right shoulder pain 07/04/2018  . Unilateral primary osteoarthritis, left knee 06/14/2018  . Essential hypertension 04/28/2018  . Hypertensive disorder 04/18/2018  . Cholecystitis with cholelithiasis 02/11/2018  . Multiple pulmonary nodules determined by computed tomography of lung 01/26/2018  . Upper airway cough syndrome 01/25/2018    Earlie Counts, PT 10/29/19 12:29 PM   [facility] [address] [place], [place], [address] Phone: [phone]   Fax:  [phone]  Name: Prescious Hurless MRN: [id number] Date of Birth: 1950-10-10

## 2019-10-29 NOTE — Patient Instructions (Addendum)
Toileting Techniques for Bowel Movements (Defecation) Using your belly (abdomen) and pelvic floor muscles to have a bowel movement is usually instinctive.  Sometimes people can have problems with these muscles and have to relearn proper defecation (emptying) techniques.  If you have weakness in your muscles, organs that are falling out, decreased sensation in your pelvis, or ignore your urge to go, you may find yourself straining to have a bowel movement.  You are straining if you are: . holding your breath or taking in a huge gulp of air and holding it  . keeping your lips and jaw tensed and closed tightly . turning red in the face because of excessive pushing or forcing . developing or worsening your  hemorrhoids . getting faint while pushing . not emptying completely and have to defecate many times a day  If you are straining, you are actually making it harder for yourself to have a bowel movement.  Many people find they are pulling up with the pelvic floor muscles and closing off instead of opening the anus. Due to lack pelvic floor relaxation and coordination the abdominal muscles, one has to work harder to push the feces out.  Many people have never been taught how to defecate efficiently and effectively.  Notice what happens to your body when you are having a bowel movement.  While you are sitting on the toilet pay attention to the following areas: . Jaw and mouth position . Angle of your hips   . Whether your feet touch the ground or not . Arm placement  . Spine position . Waist . Belly tension . Anus (opening of the anal canal)  An Evacuation/Defecation Plan   Here are the 4 basic points:  1. Lean forward enough for your elbows to rest on your knees 2. Support your feet on the floor or use a low stool if your feet don't touch the floor  3. Push out your belly as if you have swallowed a beach ball--you should feel a widening of your waist 4. Open and relax your pelvic floor  muscles, rather than tightening around the anus       The following conditions my require modifications to your toileting posture:  . If you have had surgery in the past that limits your back, hip, pelvic, knee or ankle flexibility . Constipation   Your healthcare practitioner may make the following additional suggestions and adjustments:  1) Sit on the toilet  a) Make sure your feet are supported. b) Notice your hip angle and spine position--most people find it effective to lean forward or raise their knees, which can help the muscles around the anus to relax  c) When you lean forward, place your forearms on your thighs for support  2) Relax suggestions a) Breath deeply in through your nose and out slowly through your mouth as if you are smelling the flowers and blowing out the candles. b) To become aware of how to relax your muscles, contracting and releasing muscles can be helpful.  Pull your pelvic floor muscles in tightly by using the image of holding back gas, or closing around the anus (visualize making a circle smaller) and lifting the anus up and in.  Then release the muscles and your anus should drop down and feel open. Repeat 5 times ending with the feeling of relaxation. c) Keep your pelvic floor muscles relaxed; let your belly bulge out. d) The digestive tract starts at the mouth and ends at the anal opening, so  be sure to relax both ends of the tube.  Place your tongue on the roof of your mouth with your teeth separated.  This helps relax your mouth and will help to relax the anus at the same time.  3) Empty (defecation) a) Keep your pelvic floor and sphincter relaxed, then bulge your anal muscles.  Make the anal opening wide.  b) Stick your belly out as if you have swallowed a beach ball. c) Make your belly wall hard using your belly muscles while continuing to breathe. Doing this makes it easier to open your anus. d) Breath out and give a grunt (or try using other sounds  such as ahhhh, shhhhh, ohhhh or grrrrrrr).  4) Finish a) As you finish your bowel movement, pull the pelvic floor muscles up and in.  This will leave your anus in the proper place rather than remaining pushed out and down. If you leave your anus pushed out and down, it will start to feel as though that is normal and give you incorrect signals about needing to have a bowel movement.  Introduction to Bowel Health Diet and daily habits can help you predict when your bowels will move on a regular basis.  The consistency and quantity of the stool is usually more important than the frequency.  The goal is to have a regular bowel movement that is soft but formed.   Tips on Emptying Regularly . Eat breakfast.  Usually the best time of day for a bowel movement will be a half hour to an hour after eating.  These times are best because the body uses the gastrocolic reflex, a stimulation of bowel motion that occurs with eating, to help produce a bowel movement.  For some people even a simple hot drink in the morning can help the reflex action begin. . Eat all your meals at a predictable time each day.  The bowel functions best when food is introduced at the same regular intervals. . The amount of food eaten at a given time of day should be about the same size from day to day.  The bowel functions best when food is introduced in similar quantities from day to day. It is fine to have a small breakfast and a large lunch, or vice versa, just be consistent. . Eat two servings of fruit or vegetables and at least one serving of a complex carbohydrates (whole grains such as brown rice, bran, whole wheat bread, or oatmeal) at each meal. . Drink plenty of water--ideally eight glasses a day.  Be sure to increase your water intake if you are increasing fiber into your diet.  Maintain Healthy Habits . Exercise daily.  You may exercise at any time of day, but you may find that bowel function is helped most if the exercise is  at a consistent time each day. . Make sure that you are not rushed and have convenient access to a bathroom at your selected time to empty your bowels. [facility] [address], [place], [place] [address] Phone # [phone] Fax [phone]   Access Code: [email or web address]  URL: [email or web address]  Date: 10/29/2019  Prepared by: Earlie Counts   Exercises Supine Butterfly Groin Stretch - 1 reps - 1 sets - 1-2 min hold - 1x daily - 7x weekly Seated Piriformis Stretch with Trunk Bend - 2 reps - 1 sets - 30 sec hold - 1x daily - 7x weekly Seated Hamstring Stretch - 2 reps - 1 sets -  30 sec hold - 1x daily - 7x weekly Supine Double Knee to Chest - 1 reps - 1 sets - 30 sec hold - 1x daily - 7x weekly Supine Lower Trunk Rotation - 2 reps - 1 sets - 30 sec hold - 1x daily - 7x weekly Cat-Camel - 10 reps - 1 sets - 1x daily - 7x weekly Child's Pose Stretch - 1 reps - 1 sets - 30 sec hold - 1x daily - 7x weekly Prone Press Up - 5 reps - 1 sets - 1x daily - 7x weekly

## 2019-10-30 ENCOUNTER — Telehealth: Payer: Self-pay | Admitting: Emergency Medicine

## 2019-10-30 DIAGNOSIS — R001 Bradycardia, unspecified: Secondary | ICD-10-CM

## 2019-10-30 NOTE — Telephone Encounter (Signed)
Called patient. Informed her per Dr. Agustin Cree he would like her to wear a monitor for 7 days now. She verbally understood. She is aware it should arrive within one week. No further questions.

## 2019-10-31 ENCOUNTER — Ambulatory Visit (INDEPENDENT_AMBULATORY_CARE_PROVIDER_SITE_OTHER): Payer: Medicare Other | Admitting: Podiatry

## 2019-10-31 ENCOUNTER — Other Ambulatory Visit: Payer: Self-pay

## 2019-10-31 DIAGNOSIS — G5763 Lesion of plantar nerve, bilateral lower limbs: Secondary | ICD-10-CM

## 2019-11-03 ENCOUNTER — Ambulatory Visit (INDEPENDENT_AMBULATORY_CARE_PROVIDER_SITE_OTHER): Payer: Medicare Other

## 2019-11-03 DIAGNOSIS — R001 Bradycardia, unspecified: Secondary | ICD-10-CM | POA: Diagnosis not present

## 2019-11-06 ENCOUNTER — Encounter: Payer: Self-pay | Admitting: Physical Therapy

## 2019-11-06 ENCOUNTER — Ambulatory Visit: Payer: Medicare Other | Admitting: Physical Therapy

## 2019-11-06 ENCOUNTER — Other Ambulatory Visit: Payer: Self-pay

## 2019-11-06 DIAGNOSIS — M6281 Muscle weakness (generalized): Secondary | ICD-10-CM | POA: Diagnosis not present

## 2019-11-06 DIAGNOSIS — K623 Rectal prolapse: Secondary | ICD-10-CM | POA: Diagnosis not present

## 2019-11-06 DIAGNOSIS — R278 Other lack of coordination: Secondary | ICD-10-CM | POA: Diagnosis not present

## 2019-11-06 DIAGNOSIS — R279 Unspecified lack of coordination: Secondary | ICD-10-CM

## 2019-11-06 NOTE — Therapy (Signed)
[facility] [address] [place], [place], [address] Phone: [phone]   Fax:  [phone]  Physical Therapy Treatment  Patient Details  Name: Nancy Christensen MRN: [id number] Date of Birth: 29-Dec-1950 Referring Provider (PT): Dr. Thornton Park   Encounter Date: 11/06/2019  PT End of Session - 11/06/19 1607    Visit Number  3    Date for PT Re-Evaluation  12/17/19    Authorization Type  Medicare BCBS federal    PT Start Time  [id number]    PT Stop Time  1608    PT Time Calculation (min)  38 min    Activity Tolerance  Patient tolerated treatment well    Behavior During Therapy  [facility] for tasks assessed/performed       Past Medical History:  Diagnosis Date  . Arthritis    left hip and knee  . Hyperlipidemia   . Hypertension     Past Surgical History:  Procedure Laterality Date  . ABDOMINAL HYSTERECTOMY  1997  . ANAL RECTAL MANOMETRY N/A 08/09/2019   Procedure: ANO RECTAL MANOMETRY;  Surgeon: Thornton Park, MD;  Location: [facility];  Service: Gastroenterology;  Laterality: N/A;  . BREAST SURGERY  2002   left cyst removal   . CHOLECYSTECTOMY N/A 02/12/2018   Procedure: LAPAROSCOPIC CHOLECYSTECTOMY;  Surgeon: Leighton Ruff, MD;  Location: [facility];  Service: General;  Laterality: N/A;  . COLONOSCOPY    . POLYPECTOMY      There were no vitals filed for this visit.  Subjective Assessment - 11/06/19 1535    Subjective  The rectal area is better. The area does not feel as protracted. I do not feel as much of the prolapse. The abdominal massage is helping.    Patient Stated Goals  Learn how to properly eliminate stool, reduce constipation, strengthen pelvic floor muscles    Currently in Pain?  No/denies                       [facility] Adult PT Treatment/Exercise - 11/06/19 0001      Lumbar Exercises: Supine   Ab Set  10 reps;5 seconds    Clam  10 reps;1 second    Clam Limitations  each leg,  abdominal bracing      Manual Therapy   Manual Therapy  Soft tissue mobilization;Myofascial release    Soft tissue mobilization  Scar massage suprapubically, abominal massage to promote peristalic motion of the large intestines to promote bowel movements    Myofascial Release  release of the facia i nthe sac of douglas, released around the bladder, release of the upper abdomen             PT Education - 11/06/19 1607    Education Details  Access Code: [email or web address]) Educated  Patient    Methods  Explanation;Demonstration;Verbal cues;Handout    Comprehension  Verbalized understanding;Returned demonstration       PT Short Term Goals - 11/06/19 1615      PT SHORT TERM GOAL #1   Title  independent with initial HEP    Time  4    Period  Weeks    Status  Achieved    Target Date  11/19/19      PT SHORT TERM GOAL #2   Title  education on abdominal massage to improve peristalic motion of intestines to reduce constipation    Time  4    Period  Weeks  Status  Achieved    Target Date  11/19/19      PT SHORT TERM GOAL #3   Title  education on correct toileting technique to reduce the strain and decrease pressure on prolapse    Time  4    Period  Weeks    Status  Achieved        PT Long Term Goals - 10/22/19 1606      PT LONG TERM GOAL #1   Title  independent with advanced HEP    Time  8    Period  Weeks    Status  New    Target Date  12/17/19      PT LONG TERM GOAL #2   Title  reduction of straining to have a bowel movement >/= 75%    Time  8    Period  Weeks    Status  New    Target Date  12/17/19      PT LONG TERM GOAL #3   Title  ability to sit without feeling her rectum is prolapsed due to being reduced >/= 50% of the time    Time  8    Period  Weeks    Status  New    Target Date  12/17/19      PT LONG TERM GOAL #4   Title  able to completely elimenate the stool 75% of the time due to improved pelvic floor coordination    Time  8    Period   Weeks    Status  New    Target Date  12/17/19            Plan - 11/06/19 1537    Clinical Impression Statement  Patient feel the prolapse is going in better. Patient is not straining to have a bowel movement. Patient is doing her abdominal massage for improve peristalic motion of the intestines. Patient will see the surgeon prior to the next visit. Patient reports less hip pain. Patient is not having blood in her stools. Patient can walk longer with less pain in her hip. Patient will benefit from skilled therapy to improve pelvic floor coordination to reduce the straining of the pelvic floor.    Personal Factors and Comorbidities  Age;Comorbidity 2;Fitness    Comorbidities  Abdominal hysterectomy; rectal prolapse    Examination-Activity Limitations  Sit;Toileting    Examination-Participation Restrictions  Driving    Rehab Potential  Good    PT Frequency  1x / week    PT Duration  8 weeks    PT Treatment/Interventions  Biofeedback;Therapeutic activities;Therapeutic exercise;Neuromuscular re-education;Manual techniques;Patient/family education;Dry needling    PT Next Visit Plan  pelvic floor EMG; check stool type, massage prior and after stool, see how MD appointment went, Work on hip strength; ask about sitting with rectum feeling full    PT Home Exercise Plan  Access Code: [email or web address]    Consulted and Agree with Plan of Care  Patient       Patient will benefit from skilled therapeutic intervention in order to improve the following deficits and impairments:  Decreased coordination, Decreased range of motion, Increased fascial restricitons, Increased muscle spasms, Decreased activity tolerance, Decreased strength  Visit Diagnosis: Unspecified lack of coordination  Muscle weakness (generalized)  Dyssynergia  Rectal prolapse     Problem List Patient Active Problem List   Diagnosis Date Noted  . 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 Rectal pain   . Dizziness 10/25/2018  .  Sinus bradycardia  10/25/2018  . Atypical chest pain 10/25/2018  . Dyslipidemia 10/25/2018  . Chronic right shoulder pain 07/04/2018  . Unilateral primary osteoarthritis, left knee 06/14/2018  . Essential hypertension 04/28/2018  . Hypertensive disorder 04/18/2018  . Cholecystitis with cholelithiasis 02/11/2018  . Multiple pulmonary nodules determined by computed tomography of lung 01/26/2018  . Upper airway cough syndrome 01/25/2018    Earlie Counts, PT 11/06/19 4:17 PM   [facility] [address], [place], [place], [address] Phone: [phone]   Fax:  [phone]  Name: Brionca Sherpa MRN: [id number] Date of Birth: 1951/05/28

## 2019-11-06 NOTE — Patient Instructions (Signed)
Access Code: [email or web address]  URL: [email or web address]  Date: 11/06/2019  Prepared by: Earlie Counts   Exercises Supine Butterfly Groin Stretch - 1 reps - 1 sets - 1-2 min hold - 1x daily - 7x weekly Seated Piriformis Stretch with Trunk Bend - 2 reps - 1 sets - 30 sec hold - 1x daily - 7x weekly Seated Hamstring Stretch - 2 reps - 1 sets - 30 sec hold - 1x daily - 7x weekly Supine Double Knee to Chest - 1 reps - 1 sets - 30 sec hold - 1x daily - 7x weekly Supine Lower Trunk Rotation - 2 reps - 1 sets - 30 sec hold - 1x daily - 7x weekly Cat-Camel - 10 reps - 1 sets - 1x daily - 7x weekly Child's Pose Stretch - 1 reps - 1 sets - 30 sec hold - 1x daily - 7x weekly Prone Press Up - 5 reps - 1 sets - 1x daily - 7x weekly Hooklying Transversus Abdominis Palpation - 10 reps - 1 sets - 5 sec hold - 1x daily - 7x weekly Bent Knee Fallouts - 10 reps - 1 sets - 1x daily - 7x weekly [facility] [address] [place], [place] [address] Phone # [phone] Fax [phone]

## 2019-11-11 DIAGNOSIS — K5902 Outlet dysfunction constipation: Secondary | ICD-10-CM | POA: Diagnosis not present

## 2019-11-11 DIAGNOSIS — K649 Unspecified hemorrhoids: Secondary | ICD-10-CM | POA: Diagnosis not present

## 2019-11-12 ENCOUNTER — Ambulatory Visit: Payer: Medicare Other | Admitting: Physical Therapy

## 2019-11-12 ENCOUNTER — Other Ambulatory Visit: Payer: Self-pay

## 2019-11-12 DIAGNOSIS — M6281 Muscle weakness (generalized): Secondary | ICD-10-CM

## 2019-11-12 DIAGNOSIS — R279 Unspecified lack of coordination: Secondary | ICD-10-CM | POA: Diagnosis not present

## 2019-11-12 DIAGNOSIS — K623 Rectal prolapse: Secondary | ICD-10-CM

## 2019-11-12 DIAGNOSIS — R278 Other lack of coordination: Secondary | ICD-10-CM

## 2019-11-12 NOTE — Therapy (Signed)
[facility] [address], [place], [place], [address] Phone: [phone]   Fax:  [phone]  Physical Therapy Treatment  Patient Details  Name: Nancy Christensen MRN: [id number] Date of Birth: [birth year] Referring Provider (PT): Dr. Thornton Park   Encounter Date: 11/12/2019  PT End of Session - 11/12/19 1624    Visit Number  4    Date for PT Re-Evaluation  12/17/19    Authorization Type  Medicare BCBS federal    PT Start Time  [id number]    PT Stop Time  1610    PT Time Calculation (min)  40 min    Activity Tolerance  Patient tolerated treatment well    Behavior During Therapy  [facility] for tasks assessed/performed       Past Medical History:  Diagnosis Date  . Arthritis    left hip and knee  . Hyperlipidemia   . Hypertension     Past Surgical History:  Procedure Laterality Date  . ABDOMINAL HYSTERECTOMY  1997  . ANAL RECTAL MANOMETRY N/A 08/09/2019   Procedure: ANO RECTAL MANOMETRY;  Surgeon: Thornton Park, MD;  [facility];  Service: Gastroenterology;  Laterality: N/A;  . BREAST SURGERY  2002   left cyst removal   . CHOLECYSTECTOMY N/A 02/12/2018   Procedure: LAPAROSCOPIC CHOLECYSTECTOMY;  Surgeon: Leighton Ruff, MD;  [facility];  Service: General;  Laterality: N/A;  . COLONOSCOPY    . POLYPECTOMY      There were no vitals filed for this visit.  Subjective Assessment - 11/12/19 1543    Subjective  MD said I have a rectal prolapse. I bleed due to hemorrhoids. He wants me to continue with the PT. He wants to see patient 1 month after she is discharged. 60% less straining and prolpasing of rectum.    Patient Stated Goals  Learn how to properly eliminate stool, reduce constipation, strengthen pelvic floor muscles    Currently in Pain?  No/denies    Multiple Pain Sites  No                    Pelvic Floor Special Questions - 11/12/19 0001    Biofeedback  sitting relaxing  the pelvic floor with diaphragmatic breathing, relax the pelvic floor with breathing to pretend she is having a bowel movement, contract quickly to 40 uv, hold for 10 seconds will fatique and start a 8 uv    Biofeedback sensor type  Surface   rectal        [facility] Adult PT Treatment/Exercise - 11/12/19 0001      Self-Care   Self-Care  Other Self-Care Comments    Other Self-Care Comments   using coconut oil on the rectal area to improve tissue health, massaging the anus before and after a bowel movement to relax the pelvic floor muscles and reduce any pain.       Lumbar Exercises: Aerobic   Nustep  9min, level 2 while assessing patient               PT Short Term Goals - 11/06/19 1615      PT SHORT TERM GOAL #1   Title  independent with initial HEP    Time  4    Period  Weeks    Status  Achieved    Target Date  11/19/19      PT SHORT TERM GOAL #2   Title  education on abdominal massage to improve peristalic motion of intestines  to reduce constipation    Time  4    Period  Weeks    Status  Achieved    Target Date  11/19/19      PT SHORT TERM GOAL #3   Title  education on correct toileting technique to reduce the strain and decrease pressure on prolapse    Time  4    Period  Weeks    Status  Achieved        PT Long Term Goals - 11/12/19 1540      PT LONG TERM GOAL #1   Title  independent with advanced HEP    Time  8    Period  Weeks    Status  On-going      PT LONG TERM GOAL #2   Title  reduction of straining to have a bowel movement >/= 75%    Baseline  60% better    Time  8    Period  Weeks    Status  On-going      PT LONG TERM GOAL #3   Title  ability to sit without feeling her rectum is prolapsed due to being reduced >/= 50% of the time    Baseline  60% better using the breathing technique    Time  8    Period  Weeks    Status  On-going      PT LONG TERM GOAL #4   Title  able to completely elimenate the stool 75% of the time due to improved  pelvic floor coordination    Time  8    Period  Weeks    Status  Achieved            Plan - 11/12/19 1545    Clinical Impression Statement  Patient reports 60% less straining and 60% improvement of rectal prolpase. Patient reports one time the rectum prolapsed. Patient stool type is Type 3 or 4. Patient is able to relax the pelvic floor with breathing and if the pelvic floor contracts she is able to feel it and correct herself. Patient is able to contract pelvic floor 1 quick flick at 40 uv. Patient is able to hold the pelvic floor  contraction for 10 seconds but shows fatique and decreased control. Patient will benefit from skilled therapy to improve pelvic floor control and strength.    Personal Factors and Comorbidities  Age;Comorbidity 2;Fitness    Comorbidities  Abdominal hysterectomy; rectal prolapse    Examination-Activity Limitations  Sit;Toileting    Examination-Participation Restrictions  Driving    Rehab Potential  Good    PT Frequency  1x / week    PT Duration  8 weeks    PT Treatment/Interventions  Biofeedback;Therapeutic activities;Therapeutic exercise;Neuromuscular re-education;Manual techniques;Patient/family education;Dry needling    PT Next Visit Plan  pelvic floor EMG while working on strength and coordination, Work on hip strength;    PT Home Exercise Plan  Access Code: [email or web address]    Consulted and Agree with Plan of Care  Patient       Patient will benefit from skilled therapeutic intervention in order to improve the following deficits and impairments:  Decreased coordination, Decreased range of motion, Increased fascial restricitons, Increased muscle spasms, Decreased activity tolerance, Decreased strength  Visit Diagnosis: Unspecified lack of coordination  Muscle weakness (generalized)  Dyssynergia  Rectal prolapse     Problem List Patient Active Problem List   Diagnosis Date Noted  . 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 Rectal pain   .  Dizziness 10/25/2018  . Sinus bradycardia 10/25/2018  . Atypical chest pain 10/25/2018  . Dyslipidemia 10/25/2018  . Chronic right shoulder pain 07/04/2018  . Unilateral primary osteoarthritis, left knee 06/14/2018  . Essential hypertension 04/28/2018  . Hypertensive disorder 04/18/2018  . Cholecystitis with cholelithiasis 02/11/2018  . Multiple pulmonary nodules determined by computed tomography of lung 01/26/2018  . Upper airway cough syndrome 01/25/2018    Alisha Burgo 11/12/2019, 4:25 PM  [facility] [address] [place], [place], [address] Phone: [phone]   Fax:  [phone]  Name: Nancy Christensen MRN: [id number] Date of Birth: December 19, 1950

## 2019-11-19 ENCOUNTER — Ambulatory Visit: Payer: Medicare Other | Admitting: Physical Therapy

## 2019-11-19 ENCOUNTER — Telehealth: Payer: Self-pay | Admitting: Cardiology

## 2019-11-19 DIAGNOSIS — R001 Bradycardia, unspecified: Secondary | ICD-10-CM | POA: Diagnosis not present

## 2019-11-19 DIAGNOSIS — I8311 Varicose veins of right lower extremity with inflammation: Secondary | ICD-10-CM | POA: Diagnosis not present

## 2019-11-19 NOTE — Telephone Encounter (Signed)
Heidi from [employer] was calling to report abnormal readings from the patient's Xio patch.  Please call the company and use reference #: [id number]

## 2019-11-19 NOTE — Telephone Encounter (Signed)
I spoke with Heidi at [facility].  She is calling to report monitor showed 30 seconds of sinus bradycardia on 11/05/19 at 6 PM. Heart rate was 40 beats per minute.  This is on page 7, strip 7. Patient pressed button at that time but did not make a diary entry.

## 2019-11-19 NOTE — Telephone Encounter (Signed)
It will be viewable when final report is in computer.

## 2019-11-19 NOTE — Telephone Encounter (Signed)
Dr. Agustin Cree reviewed monitor. Called patient she reports no symptoms except a "trickle" in her chest when she pressed the button. She denies shortness of breath, chest pain, or dizziness. She is scheduled to see [place] on Thursday advised her to let us know if she has any other issues.

## 2019-11-19 NOTE — Telephone Encounter (Signed)
How can I see that rhythm strip.?

## 2019-11-21 ENCOUNTER — Encounter: Payer: Self-pay | Admitting: Cardiology

## 2019-11-21 ENCOUNTER — Ambulatory Visit (INDEPENDENT_AMBULATORY_CARE_PROVIDER_SITE_OTHER): Payer: Medicare Other | Admitting: Cardiology

## 2019-11-21 ENCOUNTER — Encounter (HOSPITAL_COMMUNITY): Payer: Medicare Other

## 2019-11-21 ENCOUNTER — Other Ambulatory Visit: Payer: Self-pay

## 2019-11-21 ENCOUNTER — Ambulatory Visit (INDEPENDENT_AMBULATORY_CARE_PROVIDER_SITE_OTHER): Payer: Medicare Other | Admitting: Podiatry

## 2019-11-21 VITALS — BP 130/68 | HR 53 | Ht 64.0 in | Wt 206.4 lb

## 2019-11-21 DIAGNOSIS — G588 Other specified mononeuropathies: Secondary | ICD-10-CM

## 2019-11-21 DIAGNOSIS — R42 Dizziness and giddiness: Secondary | ICD-10-CM

## 2019-11-21 DIAGNOSIS — R001 Bradycardia, unspecified: Secondary | ICD-10-CM

## 2019-11-21 DIAGNOSIS — R0789 Other chest pain: Secondary | ICD-10-CM

## 2019-11-21 DIAGNOSIS — I10 Essential (primary) hypertension: Secondary | ICD-10-CM | POA: Diagnosis not present

## 2019-11-21 DIAGNOSIS — G5763 Lesion of plantar nerve, bilateral lower limbs: Secondary | ICD-10-CM | POA: Diagnosis not present

## 2019-11-21 MED ORDER — AMLODIPINE BESYLATE 5 MG PO TABS: 5.0000 mg | ORAL_TABLET | Freq: Every day | ORAL | 3 refills | Status: DC

## 2019-11-21 NOTE — Patient Instructions (Signed)
Medication Instructions:  Your physician has recommended you make the following change in your medication:  Stop Diltiazem  Start Norvasc 5 mg Daily   *If you need a refill on your cardiac medications before your next appointment, please call your pharmacy*  Lab Work: NONE   If you have labs (blood work) drawn today and your tests are completely normal, you will receive your results only by: Marland Kitchen MyChart Message (if you have MyChart) OR . A paper copy in the mail If you have any lab test that is abnormal or we need to change your treatment, we will call you to review the results.  Testing/Procedures: NONE   Follow-Up: At [facility], you and your health needs are our priority.  As part of our continuing mission to provide you with exceptional heart care, we have created designated Provider Care Teams.  These Care Teams include your primary Cardiologist (physician) and Advanced Practice Providers (APPs -  Physician Assistants and Nurse Practitioners) who all work together to provide you with the care you need, when you need it.  Your next appointment:   5 month(s)  The format for your next appointment:   In Person  Provider:   Jenne Campus, MD  Other Instructions Thank you for choosing [facility]!

## 2019-11-21 NOTE — Progress Notes (Signed)
Cardiology Office Note:    Date:  11/21/2019   ID:  Nancy Christensen, DOB [birth year], MRN [id number]  PCP:  Billie Ruddy, MD  Cardiologist:  Jenne Campus, MD    Referring MD: Billie Ruddy, MD   No chief complaint on file. Doing well  History of Present Illness:    Nancy Christensen is a 69 y.o. female with past medical history significant for essential hypertension, sinus bradycardia, dyslipidemia.  Comes today to my office after she wore monitor.  Monitor shows some significant slowing down her heart rate and she also pressed button few times.  I brought her today to talk about this she said she felt some thickening sensation of the chest that is why she pressed button.  There was no dizziness no passing out no decrease in ability to exercise.  She did have sinus bradycardia as underlying rhythm with first-degree AV block. She keep exercising on the regular basis, disappointed with the weather that being better lately, she does complain of having some pain in the left hip.  Past Medical History:  Diagnosis Date  . Arthritis    left hip and knee  . Hyperlipidemia   . Hypertension     Past Surgical History:  Procedure Laterality Date  . ABDOMINAL HYSTERECTOMY  1997  . ANAL RECTAL MANOMETRY N/A 08/09/2019   Procedure: ANO RECTAL MANOMETRY;  Surgeon: Thornton Park, MD;  [facility];  Service: Gastroenterology;  Laterality: N/A;  . BREAST SURGERY  2002   left cyst removal   . CHOLECYSTECTOMY N/A 02/12/2018   Procedure: LAPAROSCOPIC CHOLECYSTECTOMY;  Surgeon: Leighton Ruff, MD;  [facility];  Service: General;  Laterality: N/A;  . COLONOSCOPY    . POLYPECTOMY      Current Medications: Current Meds  Medication Sig  . BILE SALTS-CAPS-CASC-PHENOLPTH PO Take by mouth 3 (three) times daily. Once after each meal  . cholecalciferol (VITAMIN D3) 25 MCG (1000 UT) tablet Take 1,000 Units by mouth daily.  . diclofenac sodium (VOLTAREN) 1 % GEL APPLY 2 G  TOPICALLY 4 (FOUR) TIMES DAILY AS NEEDED FOR KNEE PAIN  . diltiazem (CARDIZEM CD) 240 MG 24 hr capsule Take 1 capsule (240 mg total) by mouth daily.  . hydrocortisone (ANUSOL-HC) 25 MG suppository Place 1 suppository (25 mg total) rectally 2 (two) times daily.  . rosuvastatin (CRESTOR) 10 MG tablet Take 1 tablet (10 mg total) by mouth daily.  . vitamin B-12 (CYANOCOBALAMIN) 1000 MCG tablet Take 1,000 mcg by mouth daily.  . vitamin C (ASCORBIC ACID) 500 MG tablet Take 500 mg by mouth daily.     Allergies:   Patient has no known allergies.   Social History   Socioeconomic History  . Marital status: Divorced    Spouse name: Not on file  . Number of children: Not on file  . Years of education: Not on file  . Highest education level: Not on file  Occupational History  . Not on file  Tobacco Use  . Smoking status: Never Smoker  . Smokeless tobacco: Never Used  Substance and Sexual Activity  . Alcohol use: Never  . Drug use: Not Currently  . Sexual activity: Not on file  Other Topics Concern  . Not on file  Social History Narrative  . Not on file   Social Determinants of Health   Financial Resource Strain:   . Difficulty of Paying Living Expenses: Not on file  Food Insecurity:   . Worried About Charity fundraiser  in the Last Year: Not on file  . Ran Out of Food in the Last Year: Not on file  Transportation Needs:   . Lack of Transportation (Medical): Not on file  . Lack of Transportation (Non-Medical): Not on file  Physical Activity:   . Days of Exercise per Week: Not on file  . Minutes of Exercise per Session: Not on file  Stress:   . Feeling of Stress : Not on file  Social Connections:   . Frequency of Communication with Friends and Family: Not on file  . Frequency of Social Gatherings with Friends and Family: Not on file  . Attends Religious Services: Not on file  . Active Member of Clubs or Organizations: Not on file  . Attends Archivist Meetings: Not on  file  . Marital Status: Not on file     Family History: The patient's family history includes Colon cancer in her maternal uncle; Diabetes in her mother; Hypertension in her mother; Lung cancer in her father; Thyroid disease in her mother; Vascular Disease in her mother. There is no history of Colon polyps, Esophageal cancer, Stomach cancer, Rectal cancer, or Breast cancer. ROS:   Please see the history of present illness.    All 14 point review of systems negative except as described per history of present illness  EKGs/Labs/Other Studies Reviewed:      Recent Labs: 06/19/2019: BUN 13; Creatinine, Ser 1.15; Hemoglobin 14.2; Platelets 208.0; Potassium 3.8; Sodium 137  Recent Lipid Panel    Component Value Date/Time   CHOL 279 (H) 06/19/2019 0849   CHOL 272 (H) 12/04/2018 1119   TRIG 133.0 06/19/2019 0849   HDL 53.30 06/19/2019 0849   HDL 56 12/04/2018 1119   CHOLHDL 5 06/19/2019 0849   VLDL 26.6 06/19/2019 0849   LDLCALC 199 (H) 06/19/2019 0849   LDLCALC 184 (H) 12/04/2018 1119    Physical Exam:    VS:  BP 130/68   Pulse (!) 53   Ht 5\' 4"  (1.626 m)   Wt 206 lb 6.4 oz (93.6 kg)   SpO2 98%   BMI 35.43 kg/m     Wt Readings from Last 3 Encounters:  11/21/19 206 lb 6.4 oz (93.6 kg)  10/15/19 208 lb (94.3 kg)  10/01/19 205 lb 8 oz (93.2 kg)     GEN:  Well nourished, well developed in no acute distress HEENT: Normal NECK: No JVD; No carotid bruits LYMPHATICS: No lymphadenopathy CARDIAC: RRR, no murmurs, no rubs, no gallops RESPIRATORY:  Clear to auscultation without rales, wheezing or rhonchi  ABDOMEN: Soft, non-tender, non-distended MUSCULOSKELETAL:  No edema; No deformity  SKIN: Warm and dry LOWER EXTREMITIES: no swelling NEUROLOGIC:  Alert and oriented x 3 PSYCHIATRIC:  Normal affect   ASSESSMENT:    1. Sinus bradycardia   2. Essential hypertension   3. Dizziness   4. Atypical chest pain    PLAN:    In order of problems listed above:  1. Sinus  bradycardia.  EKG today shows sinus bradycardia rate 53, first-degree AV block.  I will discontinue her diltiazem and I will start her on amlodipine 5 mg daily.  I told her if she develop dizziness passing out a palpitation she need to let me know. 2. Essential hypertension blood pressure appears to be well controlled.  Will do switch of her calcium channel blocker to amlodipine. 3. Dizziness denies having any. 4. Atypical chest pain denies having any.   Medication Adjustments/Labs and Tests Ordered: Current medicines are reviewed  at length with the patient today.  Concerns regarding medicines are outlined above.  No orders of the defined types were placed in this encounter.  Medication changes: No orders of the defined types were placed in this encounter.   Signed, Park Liter, MD, [facility] 11/21/2019 3:20 PM    [place]

## 2019-11-21 NOTE — Progress Notes (Signed)
  Subjective:  Patient ID: Nancy Christensen, female    DOB: 15-Jul-1951,  MRN: [id number]  Chief Complaint  Patient presents with  . Neuroma    Bilateral neuroma follow up. Pt states no pain when walking but does have some pain if she squeezes her foot. Pt states injections have been effective.    69 y.o. female presents with the above complaint. History confirmed with patient.   Objective:  Physical Exam: warm, good capillary refill, no trophic changes or ulcerative lesions, normal DP and PT pulses and normal sensory exam. Left Foot: tenderness between the 2nd and 3rd metatarsal head  Right Foot: tenderness between the 2nd and 3rd metatarsal head   Assessment:   1. Interdigital neuroma    Plan:  Patient was evaluated and treated and all questions answered.  Interdigital Neuroma -Injection delivered to the affected interspaces  Procedure: Neuroma Injection Location: Bilateral 2nd interspace Skin Prep: Alcohol. Injectate: 0.5 cc 0.5% marcaine plain, 0.5 cc celestone Disposition: Patient tolerated procedure well. Injection site dressed with a band-aid.  No follow-ups on file.

## 2019-11-22 DIAGNOSIS — I8311 Varicose veins of right lower extremity with inflammation: Secondary | ICD-10-CM | POA: Diagnosis not present

## 2019-11-26 ENCOUNTER — Ambulatory Visit (INDEPENDENT_AMBULATORY_CARE_PROVIDER_SITE_OTHER): Payer: Medicare Other

## 2019-11-26 ENCOUNTER — Encounter: Payer: Self-pay | Admitting: Physical Therapy

## 2019-11-26 ENCOUNTER — Ambulatory Visit: Payer: Medicare Other | Attending: Gastroenterology | Admitting: Physical Therapy

## 2019-11-26 ENCOUNTER — Other Ambulatory Visit: Payer: Self-pay

## 2019-11-26 ENCOUNTER — Ambulatory Visit (INDEPENDENT_AMBULATORY_CARE_PROVIDER_SITE_OTHER): Payer: Medicare Other | Admitting: Orthopaedic Surgery

## 2019-11-26 ENCOUNTER — Ambulatory Visit: Payer: Self-pay

## 2019-11-26 DIAGNOSIS — K623 Rectal prolapse: Secondary | ICD-10-CM | POA: Diagnosis not present

## 2019-11-26 DIAGNOSIS — R279 Unspecified lack of coordination: Secondary | ICD-10-CM | POA: Diagnosis not present

## 2019-11-26 DIAGNOSIS — M6281 Muscle weakness (generalized): Secondary | ICD-10-CM | POA: Diagnosis not present

## 2019-11-26 DIAGNOSIS — R278 Other lack of coordination: Secondary | ICD-10-CM

## 2019-11-26 DIAGNOSIS — M1612 Unilateral primary osteoarthritis, left hip: Secondary | ICD-10-CM

## 2019-11-26 HISTORY — DX: Unilateral primary osteoarthritis, left hip: M16.12

## 2019-11-26 NOTE — Progress Notes (Signed)
Subjective: Patient is here for ultrasound-guided intra-articular left hip injection.   Groin pain due to OA.  Objective:  Pain with passive IR.  Procedure: Ultrasound-guided left hip injection: After sterile prep with Betadine, injected 8 cc 1% lidocaine without epinephrine and 40 mg methylprednisolone using a 22-gauge spinal needle, passing the needle through the iliofemoral ligament into the femoral head/neck junction.  Injectate seen filling joint capsule. Good immediate relief.

## 2019-11-26 NOTE — Progress Notes (Signed)
Office Visit Note   Patient: Nancy Christensen           Date of Birth: Dec 21, 1950           MRN: [id number] Visit Date: 11/26/2019              Requested by: Billie Ruddy, MD [place],  [place] [address] PCP: Billie Ruddy, MD   Assessment & Plan: Visit Diagnoses:  1. Primary osteoarthritis of left hip     Plan: Impression is moderate left hip DJD.  Patient agreed to ultrasound guided hip injection with Dr. Junius Roads today.  Referral for outpatient physical therapy.  She will continue to work on losing weight.  Follow-up as needed.  Follow-Up Instructions: Return if symptoms worsen or fail to improve.   Orders:  Orders Placed This Encounter  Procedures  . XR HIP UNILAT W OR W/O PELVIS 2-3 VIEWS LEFT  . US Guided Needle Placement - No Linked Charges  . Ambulatory referral to Physical Therapy   No orders of the defined types were placed in this encounter.     Procedures: No procedures performed   Clinical Data: No additional findings.   Subjective: Chief Complaint  Patient presents with  . Left Hip - Pain    Reminisce returns today for chronic left hip pain.  We saw her last year and she ended up not getting cortisone injection with Dr. Junius Roads but she has noticed that the pain has not gotten any better so she would like to explore the injection again.  She is interested in physical therapy and she is actively trying to lose weight.   Review of Systems  Constitutional: Negative.   HENT: Negative.   Eyes: Negative.   Respiratory: Negative.   Cardiovascular: Negative.   Endocrine: Negative.   Musculoskeletal: Negative.   Neurological: Negative.   Hematological: Negative.   Psychiatric/Behavioral: Negative.   All other systems reviewed and are negative.    Objective: Vital Signs: There were no vitals taken for this visit.  Physical Exam Vitals and nursing note reviewed.  Constitutional:      Appearance: She is well-developed.    Pulmonary:     Effort: Pulmonary effort is normal.  Skin:    General: Skin is warm.     Capillary Refill: Capillary refill takes less than 2 seconds.  Neurological:     Mental Status: She is alert and oriented to person, place, and time.  Psychiatric:        Behavior: Behavior normal.        Thought Content: Thought content normal.        Judgment: Judgment normal.     Ortho Exam Left hip exam shows pain with range of motion.  She has moderate limitation range of motion. Specialty Comments:  No specialty comments available.  Imaging: US Guided Needle Placement - No Linked Charges  Result Date: 11/26/2019 Please see Notes tab for imaging impression.  XR HIP UNILAT W OR W/O PELVIS 2-3 VIEWS LEFT  Result Date: 11/26/2019 Moderate left hip DJD.  Minimal progression compared to the left previous x-ray.    PMFS History: Patient Active Problem List   Diagnosis Date Noted  . Primary osteoarthritis of left hip 11/26/2019  . 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 Rectal pain   . Dizziness 10/25/2018  . Sinus bradycardia 10/25/2018  . Atypical chest pain 10/25/2018  . Dyslipidemia 10/25/2018  . Chronic right shoulder pain 07/04/2018  . Unilateral  primary osteoarthritis, left knee 06/14/2018  . Essential hypertension 04/28/2018  . Hypertensive disorder 04/18/2018  . Cholecystitis with cholelithiasis 02/11/2018  . Multiple pulmonary nodules determined by computed tomography of lung 01/26/2018  . Upper airway cough syndrome 01/25/2018   Past Medical History:  Diagnosis Date  . Arthritis    left hip and knee  . Hyperlipidemia   . Hypertension     Family History  Problem Relation Age of Onset  . Diabetes Mother   . Hypertension Mother   . Thyroid disease Mother   . Vascular Disease Mother   . Lung cancer Father   . Colon cancer Maternal Uncle   . Colon polyps Neg Hx   . Esophageal cancer Neg Hx   . Stomach cancer Neg Hx   . Rectal cancer Neg Hx   . Breast cancer  Neg Hx     Past Surgical History:  Procedure Laterality Date  . ABDOMINAL HYSTERECTOMY  1997  . ANAL RECTAL MANOMETRY N/A 08/09/2019   Procedure: ANO RECTAL MANOMETRY;  Surgeon: Thornton Park, MD;  Location: [facility];  Service: Gastroenterology;  Laterality: N/A;  . BREAST SURGERY  2002   left cyst removal   . CHOLECYSTECTOMY N/A 02/12/2018   Procedure: LAPAROSCOPIC CHOLECYSTECTOMY;  Surgeon: Leighton Ruff, MD;  Location: [facility];  Service: General;  Laterality: N/A;  . COLONOSCOPY    . POLYPECTOMY     Social History   Occupational History  . Not on file  Tobacco Use  . Smoking status: Never Smoker  . Smokeless tobacco: Never Used  Substance and Sexual Activity  . Alcohol use: Never  . Drug use: Not Currently  . Sexual activity: Not on file

## 2019-11-26 NOTE — Therapy (Signed)
[facility] [address], [place], [place], [address] Phone: [phone]   Fax:  [phone]  Physical Therapy Treatment  Patient Details  Name: Nancy Christensen MRN: [id number] Date of Birth: 01/02/1951 Referring Provider (PT): Dr. Thornton Park   Encounter Date: 11/26/2019  PT End of Session - 11/26/19 1108    Visit Number  5    Date for PT Re-Evaluation  12/17/19    Authorization Type  Medicare BCBS federal    PT Start Time  1100    PT Stop Time  1140    PT Time Calculation (min)  40 min    Activity Tolerance  Patient tolerated treatment well    Behavior During Therapy  [facility] for tasks assessed/performed       Past Medical History:  Diagnosis Date  . Arthritis    left hip and knee  . Hyperlipidemia   . Hypertension     Past Surgical History:  Procedure Laterality Date  . ABDOMINAL HYSTERECTOMY  1997  . ANAL RECTAL MANOMETRY N/A 08/09/2019   Procedure: ANO RECTAL MANOMETRY;  Surgeon: Thornton Park, MD;  Location: [facility];  Service: Gastroenterology;  Laterality: N/A;  . BREAST SURGERY  2002   left cyst removal   . CHOLECYSTECTOMY N/A 02/12/2018   Procedure: LAPAROSCOPIC CHOLECYSTECTOMY;  Surgeon: Leighton Ruff, MD;  Location: [facility];  Service: General;  Laterality: N/A;  . COLONOSCOPY    . POLYPECTOMY      There were no vitals filed for this visit.  Subjective Assessment - 11/26/19 1107    Subjective  I feel stronger with my muscles. I am straining 60% less. Rectal prolpase is 50% better.    Patient Stated Goals  Learn how to properly eliminate stool, reduce constipation, strengthen pelvic floor muscles    Currently in Pain?  No/denies                    Pelvic Floor Special Questions - 11/26/19 0001    Biofeedback  sitting contract 10 seconds when fatiquing, contract from 7-8 uv,, working on not holding her breath, keeping the contraction on a line instead of up and down,  , working on trapezoid program, , diaphragmatic breathing to relax the pelvic floor, 2 quick flicks then hold 5 sec 2 times    Biofeedback sensor type  Surface   rectal        [facility] Adult PT Treatment/Exercise - 11/26/19 0001      Lumbar Exercises: Aerobic   Nustep  79min, level 2 while assessing patient             PT Education - 11/26/19 1138    Education Details  pelvic floor contraction in sitting    Person(s) Educated  Patient    Methods  Explanation;Demonstration;Verbal cues;Handout    Comprehension  Returned demonstration;Verbalized understanding       PT Short Term Goals - 11/06/19 1615      PT SHORT TERM GOAL #1   Title  independent with initial HEP    Time  4    Period  Weeks    Status  Achieved    Target Date  11/19/19      PT SHORT TERM GOAL #2   Title  education on abdominal massage to improve peristalic motion of intestines to reduce constipation    Time  4    Period  Weeks    Status  Achieved    Target Date  11/19/19  PT SHORT TERM GOAL #3   Title  education on correct toileting technique to reduce the strain and decrease pressure on prolapse    Time  4    Period  Weeks    Status  Achieved        PT Long Term Goals - 11/26/19 1110      PT LONG TERM GOAL #1   Title  independent with advanced HEP    Time  8    Period  Weeks    Status  On-going      PT LONG TERM GOAL #2   Title  reduction of straining to have a bowel movement >/= 75%    Baseline  60% better    Time  8    Period  Weeks    Status  On-going      PT LONG TERM GOAL #3   Title  ability to sit without feeling her rectum is prolapsed due to being reduced >/= 50% of the time    Baseline  60% better using the breathing technique    Time  8    Period  Weeks    Status  Achieved      PT LONG TERM GOAL #4   Title  able to completely elimenate the stool 75% of the time due to improved pelvic floor coordination    Time  8    Period  Weeks    Status  Achieved             Plan - 11/26/19 1109    Clinical Impression Statement  Patient was able to do diaphragmatic breathing to calm the muscles down from 4 uv to 2 uv. Paient has difficulty with 2 quick flicks then hold and contract 2 times. Patient reports she is straining 60% less. Patient feels the prolapse is 50% better. Patient is work on pelvic floor strength with EMG. Patient has difficulty with controlling the contraction. Patient is abl eto contract for 10 seconds at 8-10uv. Patient will benefit from skilled therapy to improve pelvic floor control and strength.    Personal Factors and Comorbidities  Age;Comorbidity 2;Fitness    Comorbidities  Abdominal hysterectomy; rectal prolapse    Examination-Activity Limitations  Sit;Toileting    Examination-Participation Restrictions  Driving    Rehab Potential  Good    PT Frequency  1x / week    PT Duration  8 weeks    PT Treatment/Interventions  Biofeedback;Therapeutic activities;Therapeutic exercise;Neuromuscular re-education;Manual techniques;Patient/family education;Dry needling    PT Next Visit Plan  pelvic floor EMG while working on strength and coordination, Work on hip strength;    PT Home Exercise Plan  Access Code: [email or web address]    Consulted and Agree with Plan of Care  Patient       Patient will benefit from skilled therapeutic intervention in order to improve the following deficits and impairments:  Decreased coordination, Decreased range of motion, Increased fascial restricitons, Increased muscle spasms, Decreased activity tolerance, Decreased strength  Visit Diagnosis: Unspecified lack of coordination  Muscle weakness (generalized)  Dyssynergia  Rectal prolapse     Problem List Patient Active Problem List   Diagnosis Date Noted  . 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 Rectal pain   . Dizziness 10/25/2018  . Sinus bradycardia 10/25/2018  . Atypical chest pain 10/25/2018  . Dyslipidemia 10/25/2018  . Chronic right shoulder  pain 07/04/2018  . Unilateral primary osteoarthritis, left knee 06/14/2018  . Essential hypertension 04/28/2018  . Hypertensive disorder 04/18/2018  .  Cholecystitis with cholelithiasis 02/11/2018  . Multiple pulmonary nodules determined by computed tomography of lung 01/26/2018  . Upper airway cough syndrome 01/25/2018    Earlie Counts, PT 11/26/19 11:41 AM   [facility] [address], [place], [place], [address] Phone: [phone]   Fax:  [phone]  Name: Nancy Christensen MRN: [id number] Date of Birth: [birth year]

## 2019-11-26 NOTE — Patient Instructions (Addendum)
Slow Contraction: Gravity Resisted (Sitting)    Sitting, slowly squeeze pelvic floor for _10__ seconds. Rest for _10__ seconds. Repeat _10__ times. Do __3_ times a day.  Copyright  VHI. All rights reserved.  Combination: Slow Hold With Quick Flick (Sitting)    Sitting, tighten pelvic floor and Hold for _5__ seconds 2 times then 2 quick contractions. Relax. Repeat _5__ times. Do __2_ times a day.  Copyright  VHI. All rights reserved.  [facility] [address] [place], [place] [address] Phone # [phone] Fax [phone]

## 2019-12-03 DIAGNOSIS — I8311 Varicose veins of right lower extremity with inflammation: Secondary | ICD-10-CM | POA: Diagnosis not present

## 2019-12-05 ENCOUNTER — Encounter: Payer: Self-pay | Admitting: Physical Therapy

## 2019-12-05 ENCOUNTER — Other Ambulatory Visit: Payer: Self-pay

## 2019-12-05 ENCOUNTER — Ambulatory Visit: Payer: Medicare Other | Admitting: Physical Therapy

## 2019-12-05 DIAGNOSIS — K623 Rectal prolapse: Secondary | ICD-10-CM

## 2019-12-05 DIAGNOSIS — M6281 Muscle weakness (generalized): Secondary | ICD-10-CM | POA: Diagnosis not present

## 2019-12-05 DIAGNOSIS — R278 Other lack of coordination: Secondary | ICD-10-CM | POA: Diagnosis not present

## 2019-12-05 DIAGNOSIS — R279 Unspecified lack of coordination: Secondary | ICD-10-CM | POA: Diagnosis not present

## 2019-12-05 NOTE — Therapy (Signed)
[facility] [address], [place], [place], [address] Phone: [phone]   Fax:  [phone]  Physical Therapy Treatment  Patient Details  Name: Nancy Christensen MRN: [id number] Date of Birth: 1951-07-31 Referring Provider (PT): Dr. Thornton Park   Encounter Date: 12/05/2019  PT End of Session - 12/05/19 1055    Visit Number  6    Date for PT Re-Evaluation  12/17/19    Authorization Type  Medicare BCBS federal    PT Start Time  1015    PT Stop Time  1055    PT Time Calculation (min)  40 min    Activity Tolerance  Patient tolerated treatment well    Behavior During Therapy  [facility] for tasks assessed/performed       Past Medical History:  Diagnosis Date  . Arthritis    left hip and knee  . Hyperlipidemia   . Hypertension     Past Surgical History:  Procedure Laterality Date  . ABDOMINAL HYSTERECTOMY  1997  . ANAL RECTAL MANOMETRY N/A 08/09/2019   Procedure: ANO RECTAL MANOMETRY;  Surgeon: Thornton Park, MD;  Location: [facility];  Service: Gastroenterology;  Laterality: N/A;  . BREAST SURGERY  2002   left cyst removal   . CHOLECYSTECTOMY N/A 02/12/2018   Procedure: LAPAROSCOPIC CHOLECYSTECTOMY;  Surgeon: Leighton Ruff, MD;  Location: [facility];  Service: General;  Laterality: N/A;  . COLONOSCOPY    . POLYPECTOMY      There were no vitals filed for this visit.  Subjective Assessment - 12/05/19 1020    Subjective  I will be evaluated later for my hip pain. I am more aware of what I should be doing. I have not had constipation since last visit. I am taking the Metamucil. I have bee more aware of my posture. Rectal prolapse has not gotten worse. I felt the rectal prolapse one time with a bowel movement. Typically in the past I would feel it every bowel movement.    Patient Stated Goals  Learn how to properly eliminate stool, reduce constipation, strengthen pelvic floor muscles    Currently in Pain?   No/denies                       [facility] Adult PT Treatment/Exercise - 12/05/19 0001      Lumbar Exercises: Stretches   Active Hamstring Stretch  Right;Left;1 rep;30 seconds    Active Hamstring Stretch Limitations  supine with strap    Hip Flexor Stretch  Right;Left;1 rep;60 seconds    Hip Flexor Stretch Limitations  with towel roll under thigh and pressure to glide the femur anteriorly wiht therapist stretching then work in ER    ITB Stretch  Right;Left;1 rep;30 seconds    ITB Stretch Limitations  supine with strap    Piriformis Stretch  Right;Left;1 rep;30 seconds    Piriformis Stretch Limitations  both legs, supine    Figure 4 Stretch  3 reps    Other Lumbar Stretch Exercise  hip adductor stretch supine with strap right, left      Lumbar Exercises: Supine   Bent Knee Raise  20 reps;1 second    Bent Knee Raise Limitations  pelvic floor contraction and not moving the pelvis      Lumbar Exercises: Sidelying   Clam  Right;Left;15 reps;1 second    Clam Limitations  pelvic floor contraction      Lumbar Exercises: Prone   Straight Leg Raise  20 reps;1  second    Straight Leg Raises Limitations  contract gluteal first, do not move pelvis and contract pelvic floor,              PT Education - 12/05/19 1054    Education Details  Access Code: [email or web address]    Person(s) Educated  Patient    Methods  Explanation;Demonstration;Verbal cues;Handout    Comprehension  Verbalized understanding;Returned demonstration       PT Short Term Goals - 11/06/19 1615      PT SHORT TERM GOAL #1   Title  independent with initial HEP    Time  4    Period  Weeks    Status  Achieved    Target Date  11/19/19      PT SHORT TERM GOAL #2   Title  education on abdominal massage to improve peristalic motion of intestines to reduce constipation    Time  4    Period  Weeks    Status  Achieved    Target Date  11/19/19      PT SHORT TERM GOAL #3   Title  education on correct toileting  technique to reduce the strain and decrease pressure on prolapse    Time  4    Period  Weeks    Status  Achieved        PT Long Term Goals - 12/05/19 1101      PT LONG TERM GOAL #1   Title  independent with advanced HEP    Time  8    Period  Weeks    Status  On-going      PT LONG TERM GOAL #2   Title  reduction of straining to have a bowel movement >/= 75%    Baseline  60% better    Time  8    Period  Weeks    Status  On-going      PT LONG TERM GOAL #3   Title  ability to sit without feeling her rectum is prolapsed due to being reduced >/= 50% of the time    Baseline  60% better using the breathing technique    Time  8    Period  Weeks    Status  Achieved      PT LONG TERM GOAL #4   Title  able to completely elimenate the stool 75% of the time due to improved pelvic floor coordination    Time  8    Period  Weeks    Status  Achieved            Plan - 12/05/19 1025    Clinical Impression Statement  Patient reports her prolpase is 50% better and has only happened 1 time since last session compared to every bowel movement. Patient is learning how to contract her core with pelvic floor contraction. Patient needed tactile cues to keep her pelvis in neutral with exericse. Patient is straining less. Patient is using coconut oil on the anal regions to keep the moisture. Patient will benefit from skilled therapy to improve pelvic floor control and strength.    Personal Factors and Comorbidities  Age;Comorbidity 2;Fitness    Comorbidities  Abdominal hysterectomy; rectal prolapse    Examination-Activity Limitations  Sit;Toileting    Examination-Participation Restrictions  Driving    Rehab Potential  Good    PT Frequency  1x / week    PT Duration  8 weeks    PT Treatment/Interventions  Biofeedback;Therapeutic activities;Therapeutic exercise;Neuromuscular re-education;Manual techniques;Patient/family education;Dry needling  PT Next Visit Plan  core strength, pelvic floor  strength    PT Home Exercise Plan  Access Code: [email or web address]    Consulted and Agree with Plan of Care  Patient       Patient will benefit from skilled therapeutic intervention in order to improve the following deficits and impairments:  Decreased coordination, Decreased range of motion, Increased fascial restricitons, Increased muscle spasms, Decreased activity tolerance, Decreased strength  Visit Diagnosis: Unspecified lack of coordination  Muscle weakness (generalized)  Dyssynergia  Rectal prolapse     Problem List Patient Active Problem List   Diagnosis Date Noted  . Primary osteoarthritis of left hip 11/26/2019  . 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 Rectal pain   . Dizziness 10/25/2018  . Sinus bradycardia 10/25/2018  . Atypical chest pain 10/25/2018  . Dyslipidemia 10/25/2018  . Chronic right shoulder pain 07/04/2018  . Unilateral primary osteoarthritis, left knee 06/14/2018  . Essential hypertension 04/28/2018  . Hypertensive disorder 04/18/2018  . Cholecystitis with cholelithiasis 02/11/2018  . Multiple pulmonary nodules determined by computed tomography of lung 01/26/2018  . Upper airway cough syndrome 01/25/2018    Earlie Counts, PT 12/05/19 11:02 AM   [facility] [address], [place], [place], [address] Phone: [phone]   Fax:  [phone]  Name: Nancy Christensen MRN: [id number] Date of Birth: 11-25-1950

## 2019-12-05 NOTE — Patient Instructions (Signed)
Access Code: [email or web address] URL: [email or web address] Date: 12/05/2019 Prepared by: Earlie Counts  Exercises Supine Butterfly Groin Stretch - 1 reps - 1 sets - 1-2 min hold - 1x daily - 7x weekly Seated Piriformis Stretch with Trunk Bend - 2 reps - 1 sets - 30 sec hold - 1x daily - 7x weekly Seated Hamstring Stretch - 2 reps - 1 sets - 30 sec hold - 1x daily - 7x weekly Supine Double Knee to Chest - 1 reps - 1 sets - 30 sec hold - 1x daily - 7x weekly Supine Lower Trunk Rotation - 2 reps - 1 sets - 30 sec hold - 1x daily - 7x weekly Cat-Camel - 10 reps - 1 sets - 1x daily - 7x weekly Child's Pose Stretch - 1 reps - 1 sets - 30 sec hold - 1x daily - 7x weekly Prone Press Up - 5 reps - 1 sets - 1x daily - 7x weekly Hooklying Transversus Abdominis Palpation - 10 reps - 1 sets - 5 sec hold - 1x daily - 7x weekly Bent Knee Fallouts - 10 reps - 1 sets - 1x daily - 7x weekly Clamshell - 10 reps - 2 sets - 1x daily - 7x weekly Prone Hip Extension with Pillow Under Abdomen - 10 reps - 2 sets - 1x daily - 7x weekly Supine March - 10 reps - 1 sets - 1x daily - 7x weekly [facility] [address], [place], [place] [address] Phone # [phone] Fax [phone]

## 2019-12-09 ENCOUNTER — Ambulatory Visit: Payer: Medicare Other | Attending: Internal Medicine

## 2019-12-09 ENCOUNTER — Other Ambulatory Visit: Payer: Medicare Other

## 2019-12-09 DIAGNOSIS — Z20822 Contact with and (suspected) exposure to covid-19: Secondary | ICD-10-CM

## 2019-12-10 ENCOUNTER — Encounter: Payer: Self-pay | Admitting: Physical Therapy

## 2019-12-10 ENCOUNTER — Ambulatory Visit: Payer: Medicare Other | Admitting: Physical Therapy

## 2019-12-10 ENCOUNTER — Other Ambulatory Visit: Payer: Self-pay

## 2019-12-10 DIAGNOSIS — M6281 Muscle weakness (generalized): Secondary | ICD-10-CM

## 2019-12-10 DIAGNOSIS — R279 Unspecified lack of coordination: Secondary | ICD-10-CM

## 2019-12-10 DIAGNOSIS — R278 Other lack of coordination: Secondary | ICD-10-CM | POA: Diagnosis not present

## 2019-12-10 DIAGNOSIS — K623 Rectal prolapse: Secondary | ICD-10-CM | POA: Diagnosis not present

## 2019-12-10 LAB — NOVEL CORONAVIRUS, NAA: SARS-CoV-2, NAA: NOT DETECTED

## 2019-12-10 NOTE — Therapy (Signed)
[facility] [address], [place], [place], [address] Phone: [phone]   Fax:  [phone]  Physical Therapy Treatment  Patient Details  Name: Nancy Christensen MRN: [id number] Date of Birth: Aug 21, 1951 Referring Provider (PT): Dr. Thornton Park   Encounter Date: 12/10/2019  PT End of Session - 12/10/19 0936    Visit Number  7    Date for PT Re-Evaluation  12/17/19    Authorization Type  Medicare BCBS federal    PT Start Time  0930    PT Stop Time  1008    PT Time Calculation (min)  38 min    Activity Tolerance  Patient tolerated treatment well    Behavior During Therapy  [facility] for tasks assessed/performed       Past Medical History:  Diagnosis Date  . Arthritis    left hip and knee  . Hyperlipidemia   . Hypertension     Past Surgical History:  Procedure Laterality Date  . ABDOMINAL HYSTERECTOMY  1997  . ANAL RECTAL MANOMETRY N/A 08/09/2019   Procedure: ANO RECTAL MANOMETRY;  Surgeon: Thornton Park, MD;  Location: [facility];  Service: Gastroenterology;  Laterality: N/A;  . BREAST SURGERY  2002   left cyst removal   . CHOLECYSTECTOMY N/A 02/12/2018   Procedure: LAPAROSCOPIC CHOLECYSTECTOMY;  Surgeon: Leighton Ruff, MD;  Location: [facility];  Service: General;  Laterality: N/A;  . COLONOSCOPY    . POLYPECTOMY      There were no vitals filed for this visit.  Subjective Assessment - 12/10/19 0937    Subjective  The exercises are helping. Patient has not had constipation. I have not had a rectal prolapse since last visit.    Patient Stated Goals  Learn how to properly eliminate stool, reduce constipation, strengthen pelvic floor muscles    Currently in Pain?  No/denies                       [facility] Adult PT Treatment/Exercise - 12/10/19 0001      Lumbar Exercises: Stretches   Quadruped Mid Back Stretch  1 rep;30 seconds    Quadruped Mid Back Stretch Limitations  then have the  elbow flexion to stretch the lats      Lumbar Exercises: Aerobic   Nustep  40min, level 2 while assessing patient      Lumbar Exercises: Supine   Bent Knee Raise  20 reps;1 second    Bent Knee Raise Limitations  pelvic floor contraction and not moving the pelvis    Dead Bug  20 reps;1 second    Dead Bug Limitations  abdominal bracing      Lumbar Exercises: Quadruped   Other Quadruped Lumbar Exercises  rock back and forth keeping spina neutral and needing tactile cues      Manual Therapy   Manual Therapy  Soft tissue mobilization;Joint mobilization    Joint Mobilization  posterior rib cage to improve opening of the posterior rib cage for breath, P-A and rotational mobilization to T4-L1 to improve mobility for breath and flatten back on mat in supine    Soft tissue mobilization  using the iastim device on the posterior trunk to release the fascia, tissue rolling on the back               PT Short Term Goals - 11/06/19 1615      PT SHORT TERM GOAL #1   Title  independent with initial HEP  Time  4    Period  Weeks    Status  Achieved    Target Date  11/19/19      PT SHORT TERM GOAL #2   Title  education on abdominal massage to improve peristalic motion of intestines to reduce constipation    Time  4    Period  Weeks    Status  Achieved    Target Date  11/19/19      PT SHORT TERM GOAL #3   Title  education on correct toileting technique to reduce the strain and decrease pressure on prolapse    Time  4    Period  Weeks    Status  Achieved        PT Long Term Goals - 12/10/19 0944      PT LONG TERM GOAL #1   Title  independent with advanced HEP    Time  8    Status  On-going      PT LONG TERM GOAL #2   Title  reduction of straining to have a bowel movement >/= 75%    Time  8    Period  Weeks    Status  Achieved      PT LONG TERM GOAL #3   Title  ability to sit without feeling her rectum is prolapsed due to being reduced >/= 50% of the time    Time  8     Period  Weeks      PT LONG TERM GOAL #4   Title  able to completely elimenate the stool 75% of the time due to improved pelvic floor coordination    Time  8    Period  Weeks    Status  Achieved            Plan - 12/10/19 0943    Clinical Impression Statement  Patient does not strain with bowel movement. Patient completely eliminates her stool. Patient has not felt her prolapse since last visit. Patient just feels the rectum slightly when sitting. Patient will benefit from skilled therapy to improve pelvic floor control and strength.    Personal Factors and Comorbidities  Age;Comorbidity 2;Fitness    Comorbidities  Abdominal hysterectomy; rectal prolapse    Examination-Activity Limitations  Sit;Toileting    Examination-Participation Restrictions  Driving    Rehab Potential  Good    PT Frequency  1x / week    PT Duration  8 weeks    PT Treatment/Interventions  Biofeedback;Therapeutic activities;Therapeutic exercise;Neuromuscular re-education;Manual techniques;Patient/family education;Dry needling    PT Next Visit Plan  assess patient for discharge, go over the HEP    PT Home Exercise Plan  Access Code: [email or web address]    Consulted and Agree with Plan of Care  Patient       Patient will benefit from skilled therapeutic intervention in order to improve the following deficits and impairments:  Decreased coordination, Decreased range of motion, Increased fascial restricitons, Increased muscle spasms, Decreased activity tolerance, Decreased strength  Visit Diagnosis: Unspecified lack of coordination  Muscle weakness (generalized)  Dyssynergia  Rectal prolapse     Problem List Patient Active Problem List   Diagnosis Date Noted  . Primary osteoarthritis of left hip 11/26/2019  . 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 Rectal pain   . Dizziness 10/25/2018  . Sinus bradycardia 10/25/2018  . Atypical chest pain 10/25/2018  . Dyslipidemia 10/25/2018  . Chronic right shoulder  pain 07/04/2018  . Unilateral primary osteoarthritis, left knee 06/14/2018  .  Essential hypertension 04/28/2018  . Hypertensive disorder 04/18/2018  . Cholecystitis with cholelithiasis 02/11/2018  . Multiple pulmonary nodules determined by computed tomography of lung 01/26/2018  . Upper airway cough syndrome 01/25/2018    Earlie Counts, PT 12/10/19 10:10 AM   [facility] [address] [place], [place], [address] Phone: [phone]   Fax:  [phone]  Name: Nancy Christensen MRN: [id number] Date of Birth: [birth year]

## 2019-12-13 ENCOUNTER — Other Ambulatory Visit: Payer: Medicare Other

## 2019-12-17 ENCOUNTER — Encounter: Payer: Self-pay | Admitting: Physical Therapy

## 2019-12-17 ENCOUNTER — Ambulatory Visit: Payer: Medicare Other | Admitting: Physical Therapy

## 2019-12-17 ENCOUNTER — Other Ambulatory Visit: Payer: Self-pay

## 2019-12-17 DIAGNOSIS — K623 Rectal prolapse: Secondary | ICD-10-CM

## 2019-12-17 DIAGNOSIS — R278 Other lack of coordination: Secondary | ICD-10-CM

## 2019-12-17 DIAGNOSIS — R279 Unspecified lack of coordination: Secondary | ICD-10-CM | POA: Diagnosis not present

## 2019-12-17 DIAGNOSIS — M6281 Muscle weakness (generalized): Secondary | ICD-10-CM | POA: Diagnosis not present

## 2019-12-17 NOTE — Patient Instructions (Signed)
Access Code: [email or web address] URL: [email or web address] Date: 12/17/2019 Prepared by: Earlie Counts  Exercises Supine Butterfly Groin Stretch - 1 x daily - 7 x weekly - 1 reps - 1 sets - 1-2 min hold Seated Piriformis Stretch with Trunk Bend - 1 x daily - 7 x weekly - 2 reps - 1 sets - 30 sec hold Seated Hamstring Stretch - 1 x daily - 7 x weekly - 2 reps - 1 sets - 30 sec hold Supine Double Knee to Chest - 1 x daily - 7 x weekly - 1 reps - 1 sets - 30 sec hold Supine Lower Trunk Rotation - 1 x daily - 7 x weekly - 2 reps - 1 sets - 30 sec hold Cat-Camel - 1 x daily - 7 x weekly - 10 reps - 1 sets Child's Pose Stretch - 1 x daily - 7 x weekly - 1 reps - 1 sets - 30 sec hold Prone Press Up - 1 x daily - 7 x weekly - 5 reps - 1 sets Hooklying Transversus Abdominis Palpation - 1 x daily - 7 x weekly - 10 reps - 1 sets - 5 sec hold Bent Knee Fallouts - 1 x daily - 7 x weekly - 10 reps - 1 sets Clamshell - 1 x daily - 7 x weekly - 10 reps - 2 sets Prone Hip Extension with Pillow Under Abdomen - 1 x daily - 7 x weekly - 10 reps - 2 sets Supine March - 1 x daily - 7 x weekly - 10 reps - 1 sets Sidelying Pelvic Floor Contraction with Self-Palpation - 3 x daily - 7 x weekly - 1 sets - 5 reps - 5 sec hold [facility] [address], [place], [place] [address] Phone # [phone] Fax [phone]

## 2019-12-17 NOTE — Therapy (Signed)
[facility] [address], [place], [place], [address] Phone: [phone]   Fax:  [phone]  Physical Therapy Treatment  Patient Details  Name: Nancy Christensen MRN: [id number] Date of Birth: [birth year] Referring Provider (PT): Dr. Thornton Park   Encounter Date: 12/17/2019  PT End of Session - 12/17/19 1022    Visit Number  8    Date for PT Re-Evaluation  12/17/19    Authorization Type  Medicare BCBS federal    PT Start Time  4081    PT Stop Time  1055    PT Time Calculation (min)  40 min    Activity Tolerance  Patient tolerated treatment well       Past Medical History:  Diagnosis Date  . Arthritis    left hip and knee  . Hyperlipidemia   . Hypertension     Past Surgical History:  Procedure Laterality Date  . ABDOMINAL HYSTERECTOMY  1997  . ANAL RECTAL MANOMETRY N/A 08/09/2019   Procedure: ANO RECTAL MANOMETRY;  Surgeon: Thornton Park, MD;  [facility]: [facility];  Service: Gastroenterology;  Laterality: N/A;  . BREAST SURGERY  2002   left cyst removal   . CHOLECYSTECTOMY N/A 02/12/2018   Procedure: LAPAROSCOPIC CHOLECYSTECTOMY;  Surgeon: Leighton Ruff, MD;  Location: [facility];  Service: General;  Laterality: N/A;  . COLONOSCOPY    . POLYPECTOMY      There were no vitals filed for this visit.  Subjective Assessment - 12/17/19 1021    Subjective  I am lethargic today due to not sleeping last night.    Patient Stated Goals  Learn how to properly eliminate stool, reduce constipation, strengthen pelvic floor muscles    Currently in Pain?  No/denies    Multiple Pain Sites  No         [facility] PT Assessment - 12/17/19 0001      Assessment   Medical Diagnosis  R27.8 Dyssynergia    Referring Provider (PT)  Dr. Thornton Park    Onset Date/Surgical Date  --   chronic   Prior Therapy  none      Precautions   Precautions  None      Restrictions   Weight Bearing Restrictions  No      Home Environment   Living Environment  Private residence      Prior Function   Level of [place]  Retired    Leisure  walking 2 miles per day       Cognition   Overall Cognitive Status  Within Functional Limits for tasks assessed      Posture/Postural Control   Posture/Postural Control  No significant limitations      Strength   Right Hip Flexion  5/5    Right Hip Extension  4/5    Right Hip ABduction  4/5    Left Hip Flexion  5/5    Left Hip Extension  4+/5    Left Hip ABduction  4/5      Palpation   Palpation comment  no tenderness located on the abdomen                Pelvic Floor Special Questions - 12/17/19 0001    Fecal incontinence  Yes   clear liquid comes out of the rectum without feeling it   Pelvic Floor Internal Exam  Patient confirms identification and approves PT to assess pelvic floor and treatment    Exam Type  Rectal    Strength  fair squeeze, definite lift    Strength # of reps  5    Strength # of seconds  5        [facility] Adult PT Treatment/Exercise - 12/17/19 0001      Neuro Re-ed    Neuro Re-ed Details   anal sphincter contraction with therapist finge in the rectum to help facilitate contraction, 5 times 5 sec then  quick flicks      Exercises   Exercises  Other Exercises    Other Exercises   verbally reviewed exercises on medbridge and patient feels comfortable with them      Lumbar Exercises: Aerobic   Stationary Bike  level 1 for 6 minutes while assessing the patient      Manual Therapy   Manual Therapy  Internal Pelvic Floor    Internal Pelvic Floor  soft tissue work to the left levator ani and by the ischial tuberosity             PT Education - 12/17/19 1053    Education Details  Access Code: [email or web address]) Educated  Patient    Methods  Explanation;Demonstration;Verbal cues;Handout    Comprehension  Verbalized understanding;Returned demonstration       PT Short Term Goals -  12/17/19 1023      PT SHORT TERM GOAL #1   Title  independent with initial HEP    Time  4    Period  Weeks    Status  Achieved    Target Date  11/19/19      PT SHORT TERM GOAL #2   Title  education on abdominal massage to improve peristalic motion of intestines to reduce constipation    Time  4    Period  Weeks    Status  Achieved    Target Date  11/19/19      PT SHORT TERM GOAL #3   Title  education on correct toileting technique to reduce the strain and decrease pressure on prolapse    Time  4    Period  Weeks    Status  Achieved        PT Long Term Goals - 12/17/19 1023      PT LONG TERM GOAL #1   Title  independent with advanced HEP    Time  8    Period  Weeks    Status  Achieved      PT LONG TERM GOAL #2   Title  reduction of straining to have a bowel movement >/= 75%    Time  8    Period  Weeks    Status  Achieved      PT LONG TERM GOAL #3   Title  ability to sit without feeling her rectum is prolapsed due to being reduced >/= 50% of the time    Time  8    Period  Weeks    Status  Achieved      PT LONG TERM GOAL #4   Title  able to completely elimenate the stool 75% of the time due to improved pelvic floor coordination    Time  8    Period  Weeks    Status  Achieved            Plan - 12/17/19 1022    Clinical Impression Statement  Patient has met all of her goals. Patient had a trigger point in the pelvic floor that referred pain into  the left hip. Patient feels she is able to open her rib cage better. Pelvic floor strength is 3/5 with good lift and hold for 5 seconds. Patient reports a clear liquid comes out of her rectum and she does not feel it come out. Patient is independent with her HEP. Patient is ready for discharge with the pelvic floor program.    Personal Factors and Comorbidities  Age;Comorbidity 2;Fitness    Comorbidities  Abdominal hysterectomy; rectal prolapse    Examination-Activity Limitations  Sit;Toileting    Rehab Potential   Good    PT Frequency  1x / week    PT Duration  8 weeks    PT Treatment/Interventions  Biofeedback;Therapeutic activities;Therapeutic exercise;Neuromuscular re-education;Manual techniques;Patient/family education;Dry needling    PT Next Visit Plan  Discharge to HEP    PT Home Exercise Plan  Access Code: [email or web address]    Consulted and Agree with Plan of Care  Patient       Patient will benefit from skilled therapeutic intervention in order to improve the following deficits and impairments:  Decreased coordination, Decreased range of motion, Increased fascial restricitons, Increased muscle spasms, Decreased activity tolerance, Decreased strength  Visit Diagnosis: Unspecified lack of coordination  Muscle weakness (generalized)  Dyssynergia  Rectal prolapse     Problem List Patient Active Problem List   Diagnosis Date Noted  . Primary osteoarthritis of left hip 11/26/2019  . 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 Rectal pain   . Dizziness 10/25/2018  . Sinus bradycardia 10/25/2018  . Atypical chest pain 10/25/2018  . Dyslipidemia 10/25/2018  . Chronic right shoulder pain 07/04/2018  . Unilateral primary osteoarthritis, left knee 06/14/2018  . Essential hypertension 04/28/2018  . Hypertensive disorder 04/18/2018  . Cholecystitis with cholelithiasis 02/11/2018  . Multiple pulmonary nodules determined by computed tomography of lung 01/26/2018  . Upper airway cough syndrome 01/25/2018    Earlie Counts, PT 12/17/19 10:59 AM   [facility] [address] [place], [place], [address] Phone: [phone]   Fax:  [phone]  Name: Nancy Christensen MRN: [id number] Date of Birth: [birth year]  PHYSICAL THERAPY DISCHARGE SUMMARY  Visits from Start of Care: 8  Current functional level related to goals / functional outcomes: See above.    Remaining deficits: See above.    Education / Equipment: HEP Plan: Patient  agrees to discharge.  Patient goals were met. Patient is being discharged due to meeting the stated rehab goals. Thank you for the referral. Earlie Counts, PT 12/17/19 10:59 AM   ?????

## 2019-12-18 DIAGNOSIS — I8311 Varicose veins of right lower extremity with inflammation: Secondary | ICD-10-CM | POA: Diagnosis not present

## 2019-12-19 ENCOUNTER — Ambulatory Visit (INDEPENDENT_AMBULATORY_CARE_PROVIDER_SITE_OTHER): Payer: Medicare Other | Admitting: Podiatry

## 2019-12-19 ENCOUNTER — Other Ambulatory Visit: Payer: Self-pay

## 2019-12-19 DIAGNOSIS — G588 Other specified mononeuropathies: Secondary | ICD-10-CM

## 2019-12-19 DIAGNOSIS — M79676 Pain in unspecified toe(s): Secondary | ICD-10-CM | POA: Diagnosis not present

## 2019-12-19 DIAGNOSIS — B351 Tinea unguium: Secondary | ICD-10-CM

## 2019-12-19 NOTE — Progress Notes (Signed)
  Subjective:  Patient ID: Nancy Christensen, female    DOB: October 03, 1950,  MRN: [id number]  Chief Complaint  Patient presents with  . Nail Problem    Nail trim 1-5 bilateral  . Neuroma    Pt states injections are effective and she has no pain today.  . Nail Problem    Right 1st lateral border nail discoloration, pt states occasional discomfort, had ingrown procedure in this nail in august 2020.    69 y.o. female presents with the above complaint. History confirmed with patient.   Objective:  Physical Exam: warm, good capillary refill, no trophic changes or ulcerative lesions, normal DP and PT pulses and normal sensory exam. Nail thickness trophic changes with pain to palpation Left Foot: no tenderness between the 2nd and 3rd metatarsal head  Right Foot: no tenderness between the 2nd and 3rd metatarsal head   Assessment:   No diagnosis found. Plan:  Patient was evaluated and treated and all questions answered.  Interdigital Neuroma -Resolved. No pain today  Onyhcomycosis with pain -Nails debrided x10  No follow-ups on file.

## 2019-12-20 ENCOUNTER — Telehealth: Payer: Self-pay | Admitting: *Deleted

## 2019-12-20 NOTE — Telephone Encounter (Signed)
-----   Message from Thornton Park, MD sent at 12/19/2019  7:54 PM EDT ----- Please offer the patient a follow-up appointment with me if she is having ongoing questions or concerns. Thank you. ----- Message ----- From: Monico Hoar, PT Sent: 12/17/2019  11:00 AM EDT To: Thornton Park, MD  Discharge summary

## 2019-12-20 NOTE — Telephone Encounter (Signed)
Left a message for the patient to call back if she wanted to follow up with Dr. Tarri Glenn.

## 2019-12-23 ENCOUNTER — Ambulatory Visit: Payer: Medicare Other

## 2019-12-25 ENCOUNTER — Ambulatory Visit: Payer: Medicare Other | Attending: Internal Medicine

## 2019-12-25 DIAGNOSIS — Z20822 Contact with and (suspected) exposure to covid-19: Secondary | ICD-10-CM | POA: Diagnosis not present

## 2019-12-26 LAB — NOVEL CORONAVIRUS, NAA: SARS-CoV-2, NAA: NOT DETECTED

## 2019-12-29 NOTE — Progress Notes (Signed)
  Subjective:  Patient ID: Nancy Christensen, female    DOB: 1950/12/08,  MRN: [id number]  Chief Complaint  Patient presents with  . Foot Pain    Pt states bilateral 2/3 interspace pain, no known injuries, 6 weeks duration, right is painful when walking, left has discomfort.    69 y.o. female presents with the above complaint. History confirmed with patient.   Objective:  Physical Exam: warm, good capillary refill, no trophic changes or ulcerative lesions, normal DP and PT pulses and normal sensory exam. Left Foot: tenderness between the 3rd and 4th metatarsal head  Right Foot: tenderness between the 3rd and 4th metatarsal head  Assessment:   1. Morton's neuroma of both feet      Plan:  Patient was evaluated and treated and all questions answered.  Morton Neuroma -Educated on etiology -Educated on padding and proper shoegear -XR reviewed with patient -Injection delivered to the affected interspaces  Procedure: Neuroma Injection Location: Bilateral 3rd interspace Skin Prep: Alcohol. Injectate: 0.5 cc 0.5% marcaine plain, 0.5 cc dexamethasone phosphate. Disposition: Patient tolerated procedure well. Injection site dressed with a band-aid.  Return in about 3 weeks (around 11/21/2019), or neuroma f/u.

## 2019-12-31 ENCOUNTER — Ambulatory Visit: Payer: Medicare Other

## 2020-01-02 DIAGNOSIS — Z20822 Contact with and (suspected) exposure to covid-19: Secondary | ICD-10-CM | POA: Diagnosis not present

## 2020-01-02 DIAGNOSIS — Z03818 Encounter for observation for suspected exposure to other biological agents ruled out: Secondary | ICD-10-CM | POA: Diagnosis not present

## 2020-01-06 DIAGNOSIS — I8312 Varicose veins of left lower extremity with inflammation: Secondary | ICD-10-CM | POA: Diagnosis not present

## 2020-01-07 DIAGNOSIS — H43813 Vitreous degeneration, bilateral: Secondary | ICD-10-CM | POA: Diagnosis not present

## 2020-01-16 DIAGNOSIS — Z20828 Contact with and (suspected) exposure to other viral communicable diseases: Secondary | ICD-10-CM | POA: Diagnosis not present

## 2020-01-16 DIAGNOSIS — Z03818 Encounter for observation for suspected exposure to other biological agents ruled out: Secondary | ICD-10-CM | POA: Diagnosis not present

## 2020-01-21 ENCOUNTER — Ambulatory Visit: Payer: Medicare Other | Attending: Gastroenterology

## 2020-01-21 ENCOUNTER — Other Ambulatory Visit: Payer: Self-pay

## 2020-01-21 DIAGNOSIS — M6281 Muscle weakness (generalized): Secondary | ICD-10-CM | POA: Insufficient documentation

## 2020-01-21 DIAGNOSIS — R252 Cramp and spasm: Secondary | ICD-10-CM | POA: Diagnosis not present

## 2020-01-21 DIAGNOSIS — M7981 Nontraumatic hematoma of soft tissue: Secondary | ICD-10-CM | POA: Diagnosis not present

## 2020-01-21 DIAGNOSIS — M25552 Pain in left hip: Secondary | ICD-10-CM

## 2020-01-21 DIAGNOSIS — R2689 Other abnormalities of gait and mobility: Secondary | ICD-10-CM | POA: Diagnosis not present

## 2020-01-21 DIAGNOSIS — I8311 Varicose veins of right lower extremity with inflammation: Secondary | ICD-10-CM | POA: Diagnosis not present

## 2020-01-21 NOTE — Patient Instructions (Signed)
Access Code: [email or web address] URL: [email or web address] Date: 01/21/2020 Prepared by: Claiborne Billings  Exercises  Modified Marcello Moores Stretch - 3 x daily - 7 x weekly - 1 sets - 3 reps - 20 hold Hip Flexor Stretch on Step - 3 x daily - 7 x weekly - 1 sets - 3 reps - 20 hold

## 2020-01-21 NOTE — Therapy (Signed)
[facility] [address] [place], [place], [address] Phone: [phone]   Fax:  [phone]  Physical Therapy Evaluation  Patient Details  Name: Nancy Christensen MRN: [id number] Date of Birth: 05/30/1951 Referring Provider (PT): Eduard Roux, MD   Encounter Date: 01/21/2020  PT End of Session - 01/21/20 1315    Visit Number  1    Date for PT Re-Evaluation  03/17/20    Authorization Type  Medicare BCBS federal    Authorization Time Period  KX on visit #7 (8 visits earlier this year for pelvic floor)    Progress Note Due on Visit  10    PT Start Time  1102    PT Stop Time  1141    PT Time Calculation (min)  39 min    Activity Tolerance  Patient tolerated treatment well    Behavior During Therapy  [facility] for tasks assessed/performed       Past Medical History:  Diagnosis Date  . Arthritis    left hip and knee  . Hyperlipidemia   . Hypertension     Past Surgical History:  Procedure Laterality Date  . ABDOMINAL HYSTERECTOMY  1997  . ANAL RECTAL MANOMETRY N/A 08/09/2019   Procedure: ANO RECTAL MANOMETRY;  Surgeon: Thornton Park, MD;  Location: [facility];  Service: Gastroenterology;  Laterality: N/A;  . BREAST SURGERY  2002   left cyst removal   . CHOLECYSTECTOMY N/A 02/12/2018   Procedure: LAPAROSCOPIC CHOLECYSTECTOMY;  Surgeon: Leighton Ruff, MD;  Location: [facility];  Service: General;  Laterality: N/A;  . COLONOSCOPY    . POLYPECTOMY      There were no vitals filed for this visit.   Subjective Assessment - 01/21/20 1106    Subjective  Pt presents to PT with Lt hip pain that began 07/2019.  Pt had x-ray that showed moderate DJD. Pt had steriod injection into the Lt hip 11/26/19.  Injection help for a couple of weeks and now she is able to walk a little longer.    Limitations  Sitting    How long can you sit comfortably?  Lt hip pain increases with sitting -not limited in how long    How long can you  walk comfortably?  walking-2 miles max (goal of 5 miles for exercise)    Diagnostic tests  x-ray: moderate DJD    Patient Stated Goals  reduce Lt hip pain, walk for exercise    Currently in Pain?  Yes    Pain Score  2    max 6/10   Pain Location  Hip    Pain Orientation  Left    Pain Descriptors / Indicators  Grimacing;Tightness    Pain Type  Chronic pain    Pain Onset  More than a month ago    Pain Frequency  Intermittent    Aggravating Factors   sitting and then coming to standing, walking longer distances, sometimes uknown trigger    Pain Relieving Factors  Tylenol, topical rub    Effect of Pain on Daily Activities  not able to exercise, pain with sitting         [facility] PT Assessment - 01/21/20 0001      Assessment   Medical Diagnosis  Primary OA of the Lt hip    Referring Provider (PT)  Eduard Roux, MD    Onset Date/Surgical Date  08/23/19    Next MD Visit  June 2021    Prior Therapy  for pelvic  floor      Precautions   Precautions  None      Restrictions   Weight Bearing Restrictions  No      Balance Screen   Has the patient fallen in the past 6 months  No    Has the patient had a decrease in activity level because of a fear of falling?   No    Is the patient reluctant to leave their home because of a fear of falling?   No      Home Film/video editor residence      Prior Function   Level of Independence  Independent    Vocation  Retired    Leisure  walking 2 miles per day- wants to walk 5 miles, [employer], light gardening      Cognition   Overall Cognitive Status  Within Functional Limits for tasks assessed      Observation/Other Assessments   Focus on Therapeutic Outcomes (FOTO)   25% limitation      ROM / Strength   AROM / PROM / Strength  PROM      PROM   Overall PROM   Within functional limits for tasks performed    Overall PROM Comments  Rt=Lt with pain in Lt upper quadrands with scour      Strength   Overall Strength   Deficits    Right Hip Flexion  5/5    Right Hip Extension  4+/5    Right Hip ABduction  4+/5    Left Hip Flexion  4/5   pain   Left Hip Extension  4+/5    Left Hip ABduction  4+/5      Palpation   Palpation comment  palpable tenderness and tension over Lt hip flexors and proximal quads      Ambulation/Gait   Ambulation/Gait  Yes    Gait Pattern  Step-through pattern;Antalgic;Decreased weight shift to left;Decreased stance time - left                Objective measurements completed on examination: See above findings.              PT Education - 01/21/20 1142    Education Details  Access Code: [email or web address]) Educated  Patient    Methods  Explanation;Demonstration;Handout    Comprehension  Returned demonstration;Verbalized understanding       PT Short Term Goals - 01/21/20 1122      PT SHORT TERM GOAL #1   Title  be independent in intial HEP    Period  Weeks    Status  New    Target Date  02/18/20      PT SHORT TERM GOAL #2   Title  report a 30% reduction in Lt hip pain with standing and walking    Time  4    Period  Weeks    Status  New    Target Date  02/18/20      PT SHORT TERM GOAL #3   Title  reduce Lt hip pain to walk for 3-4 miles for exercise without limitation due to pain or fatigue    Time  4    Period  Weeks    Status  New    Target Date  02/18/20        PT Long Term Goals - 01/21/20 1312      PT LONG TERM GOAL #1   Title  independent with advanced HEP  Time  8    Period  Weeks    Status  New    Target Date  03/17/20      PT LONG TERM GOAL #2   Title  reduce FOTO to < = to 23% limitation    Baseline  --    Time  8    Period  Weeks    Status  On-going    Target Date  03/17/20      PT LONG TERM GOAL #3   Title  report a 70% reduction in Lt hip pain with standing and walking    Baseline  --    Time  8    Period  Weeks    Status  New    Target Date  03/17/20      PT LONG TERM GOAL #4   Title  walk 5 miles  for exercise to without limitation due to Lt hip pain or fatigue    Time  8    Period  Weeks    Status  New    Target Date  03/17/20      PT LONG TERM GOAL #5   Title  demonstrate symmetry with gait on level surface    Time  8    Period  Weeks    Status  New    Target Date  03/17/20             Plan - 01/21/20 1147    Clinical Impression Statement  Pt presents to PT with Lt hip pain.  Imaging revealed moderate DJD of the Lt hip and pt had an injection 11/26/19.  Pt reports up to 6/10 Lt hip pain after sitting and trying to come to standing and with walking > 2 miles for exercise.  Pt demonstrates antalgic gait pattern on level surface.  Pt with painful P/ROM of the Lt hip with Rt=Lt ROM and palpable tenderness over Lt hip flexors and gluteals.  Pt will benefit from skilled PT to address Lt hip pain and allow for return to functional mobility and walking for exercise with less pain.    Stability/Clinical Decision Making  Stable/Uncomplicated    Clinical Decision Making  Low    Rehab Potential  Good    PT Frequency  2x / week    PT Duration  8 weeks    PT Treatment/Interventions  Biofeedback;Therapeutic activities;Therapeutic exercise;Neuromuscular re-education;Manual techniques;Patient/family education;Dry needling;ADLs/Self Care Home Management;Ultrasound;Moist Heat;Electrical Stimulation;Passive range of motion;Joint Manipulations    PT Next Visit Plan  manual to Lt hip flexors (declines dry needling), hip flexibility, long axis mobs to Lt hip, strength    PT Home Exercise Plan  Access Code: [email or web address]    Consulted and Agree with Plan of Care  Patient       Patient will benefit from skilled therapeutic intervention in order to improve the following deficits and impairments:  Decreased range of motion, Increased fascial restricitons, Increased muscle spasms, Decreased activity tolerance, Decreased strength, Abnormal gait, Pain, Difficulty walking, Decreased endurance  Visit  Diagnosis: Pain in left hip - Plan: PT plan of care cert/re-cert  Cramp and spasm - Plan: PT plan of care cert/re-cert  Muscle weakness (generalized) - Plan: PT plan of care cert/re-cert  Other abnormalities of gait and mobility - Plan: PT plan of care cert/re-cert     Problem List Patient Active Problem List   Diagnosis Date Noted  . Primary osteoarthritis of left hip 11/26/2019  . 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 Rectal  pain   . Dizziness 10/25/2018  . Sinus bradycardia 10/25/2018  . Atypical chest pain 10/25/2018  . Dyslipidemia 10/25/2018  . Chronic right shoulder pain 07/04/2018  . Unilateral primary osteoarthritis, left knee 06/14/2018  . Essential hypertension 04/28/2018  . Hypertensive disorder 04/18/2018  . Cholecystitis with cholelithiasis 02/11/2018  . Multiple pulmonary nodules determined by computed tomography of lung 01/26/2018  . Upper airway cough syndrome 01/25/2018    Sigurd Sos, PT 01/21/20 1:19 PM  [facility] [address] [place], [place], [address] Phone: [phone]   Fax:  [phone]  Name: Nancy Christensen MRN: [id number] Date of Birth: [birth year]

## 2020-01-28 ENCOUNTER — Other Ambulatory Visit: Payer: Self-pay

## 2020-01-28 ENCOUNTER — Ambulatory Visit: Payer: Medicare Other | Attending: Gastroenterology

## 2020-01-28 DIAGNOSIS — M6281 Muscle weakness (generalized): Secondary | ICD-10-CM | POA: Diagnosis not present

## 2020-01-28 DIAGNOSIS — R252 Cramp and spasm: Secondary | ICD-10-CM | POA: Diagnosis not present

## 2020-01-28 DIAGNOSIS — M25552 Pain in left hip: Secondary | ICD-10-CM | POA: Insufficient documentation

## 2020-01-28 DIAGNOSIS — R2689 Other abnormalities of gait and mobility: Secondary | ICD-10-CM | POA: Diagnosis not present

## 2020-01-28 NOTE — Therapy (Signed)
[facility] [address], [place], [place], [address] Phone: [phone]   Fax:  [phone]  Physical Therapy Treatment  Patient Details  Name: Nancy Christensen MRN: [id number] Date of Birth: July 27, 1951 Referring Provider (PT): Eduard Roux, MD   Encounter Date: 01/28/2020  PT End of Session - 01/28/20 1143    Visit Number  2    Date for PT Re-Evaluation  03/17/20    Authorization Type  Medicare BCBS federal    Authorization Time Period  KX on visit #7 (8 visits earlier this year for pelvic floor)    Progress Note Due on Visit  10    PT Start Time  1102    PT Stop Time  1142    PT Time Calculation (min)  40 min    Activity Tolerance  Patient tolerated treatment well    Behavior During Therapy  [facility] for tasks assessed/performed       Past Medical History:  Diagnosis Date  . Arthritis    left hip and knee  . Hyperlipidemia   . Hypertension     Past Surgical History:  Procedure Laterality Date  . ABDOMINAL HYSTERECTOMY  1997  . ANAL RECTAL MANOMETRY N/A 08/09/2019   Procedure: ANO RECTAL MANOMETRY;  Surgeon: Thornton Park, MD;  Location: [facility];  Service: Gastroenterology;  Laterality: N/A;  . BREAST SURGERY  2002   left cyst removal   . CHOLECYSTECTOMY N/A 02/12/2018   Procedure: LAPAROSCOPIC CHOLECYSTECTOMY;  Surgeon: Leighton Ruff, MD;  Location: [facility];  Service: General;  Laterality: N/A;  . COLONOSCOPY    . POLYPECTOMY      There were no vitals filed for this visit.  Subjective Assessment - 01/28/20 1100    Subjective  I walked today without difficulty for 3 miles.  I am doing my stretches.  Some stiffness overall but no Lt hip pain    Currently in Pain?  No/denies                       [facility] Adult PT Treatment/Exercise - 01/28/20 0001      Exercises   Exercises  Knee/Hip      Lumbar Exercises: Stretches   Other Lumbar Stretch Exercise  hip flexor stretch in  standing and supine 3x20 second each      Lumbar Exercises: Aerobic   Nustep  8 min, level 2 while assessing patient      Lumbar Exercises: Supine   Bridge  20 reps;5 seconds    Other Supine Lumbar Exercises  butterfly stretch 3x20 seconds      Lumbar Exercises: Sidelying   Clam  Both;20 reps    Clam Limitations  verbal cues for alignment and core/pelvic floor contraction      Manual Therapy   Manual Therapy  Joint mobilization;Soft tissue mobilization;Myofascial release    Manual therapy comments  joint mobilization to Lt hip with long axis pull and Addaday to Lt hip flexors               PT Short Term Goals - 01/21/20 1122      PT SHORT TERM GOAL #1   Title  be independent in intial HEP    Period  Weeks    Status  New    Target Date  02/18/20      PT SHORT TERM GOAL #2   Title  report a 30% reduction in Lt hip pain with standing and walking  Time  4    Period  Weeks    Status  New    Target Date  02/18/20      PT SHORT TERM GOAL #3   Title  reduce Lt hip pain to walk for 3-4 miles for exercise without limitation due to pain or fatigue    Time  4    Period  Weeks    Status  New    Target Date  02/18/20        PT Long Term Goals - 01/21/20 1312      PT LONG TERM GOAL #1   Title  independent with advanced HEP    Time  8    Period  Weeks    Status  New    Target Date  03/17/20      PT LONG TERM GOAL #2   Title  reduce FOTO to < = to 23% limitation    Baseline  --    Time  8    Period  Weeks    Status  On-going    Target Date  03/17/20      PT LONG TERM GOAL #3   Title  report a 70% reduction in Lt hip pain with standing and walking    Baseline  --    Time  8    Period  Weeks    Status  New    Target Date  03/17/20      PT LONG TERM GOAL #4   Title  walk 5 miles for exercise to without limitation due to Lt hip pain or fatigue    Time  8    Period  Weeks    Status  New    Target Date  03/17/20      PT LONG TERM GOAL #5   Title   demonstrate symmetry with gait on level surface    Time  8    Period  Weeks    Status  New    Target Date  03/17/20            Plan - 01/28/20 1111    Clinical Impression Statement  Pt with first time follow-up after evaluation.  Pt denies any pain in the Lt hip, only stiffness.  Pt is independent in HEP for hip flexibility.  Pt with stiffness in Lt hip joint and responded well to manual therapy to address tissue mobility at the Lt quads and hip flexors and joint mobility.  Pt required minor verbal cues to address alignment with exercise.  Pt will continue to benefit from skilled PT to address Lt hip pain, strength and flexibility.    PT Frequency  2x / week    PT Duration  8 weeks    PT Treatment/Interventions  Biofeedback;Therapeutic activities;Therapeutic exercise;Neuromuscular re-education;Manual techniques;Patient/family education;Dry needling;ADLs/Self Care Home Management;Ultrasound;Moist Heat;Electrical Stimulation;Passive range of motion;Joint Manipulations    PT Next Visit Plan  manual to Lt hip flexors (declines dry needling), hip flexibility and strength,  long axis mobs to Lt hip and P/ROM    PT Home Exercise Plan  Access Code: [email or web address]    Recommended Other Services  initial cert is signed    Consulted and Agree with Plan of Care  Patient       Patient will benefit from skilled therapeutic intervention in order to improve the following deficits and impairments:  Decreased range of motion, Increased fascial restricitons, Increased muscle spasms, Decreased activity tolerance, Decreased strength, Abnormal gait, Pain, Difficulty walking, Decreased endurance  Visit Diagnosis: Cramp and spasm  Pain in left hip  Muscle weakness (generalized)  Other abnormalities of gait and mobility     Problem List Patient Active Problem List   Diagnosis Date Noted  . Primary osteoarthritis of left hip 11/26/2019  . 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 Rectal pain   .  Dizziness 10/25/2018  . Sinus bradycardia 10/25/2018  . Atypical chest pain 10/25/2018  . Dyslipidemia 10/25/2018  . Chronic right shoulder pain 07/04/2018  . Unilateral primary osteoarthritis, left knee 06/14/2018  . Essential hypertension 04/28/2018  . Hypertensive disorder 04/18/2018  . Cholecystitis with cholelithiasis 02/11/2018  . Multiple pulmonary nodules determined by computed tomography of lung 01/26/2018  . Upper airway cough syndrome 01/25/2018    Sigurd Sos, PT 01/28/20 11:48 AM  [facility] [address] [place], [place], [address] Phone: [phone]   Fax:  [phone]  Name: Nancy Christensen MRN: [id number] Date of Birth: May 27, 1951

## 2020-01-29 ENCOUNTER — Telehealth: Payer: Self-pay | Admitting: Cardiology

## 2020-01-29 ENCOUNTER — Telehealth: Payer: Self-pay | Admitting: *Deleted

## 2020-01-29 NOTE — Telephone Encounter (Signed)
Pt states she is having problems with the right great toe she had procedure and is swollen and painful now, but had been fine.

## 2020-01-29 NOTE — Telephone Encounter (Signed)
Pt c/o medication issue:  1. Name of Medication: Amlodipine  2. How are you currently taking this medication (dosage and times per day)? 1 time a day  3. Are you having a reaction (difficulty breathing--STAT)? no  4. What is your medication issue? Retaining fluid from the medicine

## 2020-01-29 NOTE — Telephone Encounter (Signed)
I spoke with pt and she states it is swollen and red and painful in one area. I told pt she should come back in to be seen and until seen in office perform 1/4 cup epsom salt in 1 quart warm water for 20 minutes and cover with a light neosporin dressing.

## 2020-01-30 ENCOUNTER — Ambulatory Visit: Payer: Medicare Other

## 2020-01-30 ENCOUNTER — Other Ambulatory Visit: Payer: Self-pay

## 2020-01-30 DIAGNOSIS — R2689 Other abnormalities of gait and mobility: Secondary | ICD-10-CM | POA: Diagnosis not present

## 2020-01-30 DIAGNOSIS — R252 Cramp and spasm: Secondary | ICD-10-CM

## 2020-01-30 DIAGNOSIS — M6281 Muscle weakness (generalized): Secondary | ICD-10-CM | POA: Diagnosis not present

## 2020-01-30 DIAGNOSIS — M25552 Pain in left hip: Secondary | ICD-10-CM

## 2020-01-30 NOTE — Telephone Encounter (Signed)
Spoke with the pt and she reports bilateral lower extremity edema for about 6 weeks the same time that she started the Amlodipine.   Pt says elevating does not help and she does not usually have sodium in her diet.   Her BP has been consistent around 117/70 and HR 44-57. She denies dizziness, SOB. Will forward to Dr. Agustin Cree for review.

## 2020-01-30 NOTE — Patient Instructions (Signed)
Access Code: [email or web address] URL: [email or web address] Date: 01/21/2020 Prepared by: Claiborne Billings  Exercises   Supine Pelvic Floor Stretch - 2 x daily - 7 x weekly - 1 sets - 3 reps - 20 hold

## 2020-01-30 NOTE — Therapy (Signed)
[facility] [address], [place], [place], [address] Phone: [phone]   Fax:  [phone]  Physical Therapy Treatment  Patient Details  Name: Nancy Christensen MRN: [id number] Date of Birth: 23-Jun-1951 Referring Provider (PT): Eduard Roux, MD   Encounter Date: 01/30/2020  PT End of Session - 01/30/20 1156    Visit Number  3    Date for PT Re-Evaluation  03/17/20    Authorization Type  Medicare BCBS federal    Authorization Time Period  KX on visit #7 (8 visits earlier this year for pelvic floor)    Progress Note Due on Visit  10    PT Start Time  1101    PT Stop Time  1146    PT Time Calculation (min)  45 min    Activity Tolerance  Patient tolerated treatment well    Behavior During Therapy  [facility] for tasks assessed/performed       Past Medical History:  Diagnosis Date  . Arthritis    left hip and knee  . Hyperlipidemia   . Hypertension     Past Surgical History:  Procedure Laterality Date  . ABDOMINAL HYSTERECTOMY  1997  . ANAL RECTAL MANOMETRY N/A 08/09/2019   Procedure: ANO RECTAL MANOMETRY;  Surgeon: Thornton Park, MD;  Location: [facility];  Service: Gastroenterology;  Laterality: N/A;  . BREAST SURGERY  2002   left cyst removal   . CHOLECYSTECTOMY N/A 02/12/2018   Procedure: LAPAROSCOPIC CHOLECYSTECTOMY;  Surgeon: Leighton Ruff, MD;  Location: [facility];  Service: General;  Laterality: N/A;  . COLONOSCOPY    . POLYPECTOMY      There were no vitals filed for this visit.  Subjective Assessment - 01/30/20 1105    Subjective  I was stiff after last session.  It was just that day and now it is better.    Currently in Pain?  No/denies                       [facility] Adult PT Treatment/Exercise - 01/30/20 0001      Lumbar Exercises: Stretches   Other Lumbar Stretch Exercise  hip flexor stretch in standing and supine 3x20 second each      Lumbar Exercises: Aerobic   Nustep  8 min,  level 2 while assessing patient      Lumbar Exercises: Supine   Bridge  20 reps;5 seconds    Other Supine Lumbar Exercises  butterfly stretch 3x20 seconds    Other Supine Lumbar Exercises  happy baby stretch x2      Lumbar Exercises: Sidelying   Clam  Both;20 reps    Clam Limitations  verbal cues for alignment and core/pelvic floor contraction      Knee/Hip Exercises: Standing   Step Down  Left;2 sets;10 reps    Step Down Limitations  verbal cues to improve alignment    Rebounder  3 way weight shift x1 minute each      Manual Therapy   Manual Therapy  Joint mobilization;Soft tissue mobilization;Myofascial release    Manual therapy comments  joint mobilization to Lt hip with long axis pull and Addaday to Lt hip flexors             PT Education - 01/30/20 1128    Education Details  Access Code: [email or web address]) Educated  Patient    Methods  Explanation;Demonstration;Handout    Comprehension  Verbalized understanding;Returned demonstration  PT Short Term Goals - 01/21/20 1122      PT SHORT TERM GOAL #1   Title  be independent in intial HEP    Period  Weeks    Status  New    Target Date  02/18/20      PT SHORT TERM GOAL #2   Title  report a 30% reduction in Lt hip pain with standing and walking    Time  4    Period  Weeks    Status  New    Target Date  02/18/20      PT SHORT TERM GOAL #3   Title  reduce Lt hip pain to walk for 3-4 miles for exercise without limitation due to pain or fatigue    Time  4    Period  Weeks    Status  New    Target Date  02/18/20        PT Long Term Goals - 01/21/20 1312      PT LONG TERM GOAL #1   Title  independent with advanced HEP    Time  8    Period  Weeks    Status  New    Target Date  03/17/20      PT LONG TERM GOAL #2   Title  reduce FOTO to < = to 23% limitation    Baseline  --    Time  8    Period  Weeks    Status  On-going    Target Date  03/17/20      PT LONG TERM GOAL #3   Title  report a  70% reduction in Lt hip pain with standing and walking    Baseline  --    Time  8    Period  Weeks    Status  New    Target Date  03/17/20      PT LONG TERM GOAL #4   Title  walk 5 miles for exercise to without limitation due to Lt hip pain or fatigue    Time  8    Period  Weeks    Status  New    Target Date  03/17/20      PT LONG TERM GOAL #5   Title  demonstrate symmetry with gait on level surface    Time  8    Period  Weeks    Status  New    Target Date  03/17/20            Plan - 01/30/20 1116    Clinical Impression Statement  Pt denies any pain in the Lt hip, only stiffness.  Pt is independent in HEP for hip flexibility and new addition of strength issued this week.  Pt with stiffness in Lt hip joint and responded well to manual therapy to address tissue mobility at the Lt quads and hip flexors and joint mobility.  Pt required minor verbal cues to address alignment with exercise. Pt is more aware of hip alignment and made corrections independently today.  Pt will continue to benefit from skilled PT to address Lt hip pain, strength and flexibility.    PT Frequency  2x / week    PT Duration  8 weeks    PT Treatment/Interventions  Biofeedback;Therapeutic activities;Therapeutic exercise;Neuromuscular re-education;Manual techniques;Patient/family education;Dry needling;ADLs/Self Care Home Management;Ultrasound;Moist Heat;Electrical Stimulation;Passive range of motion;Joint Manipulations    PT Next Visit Plan  manual to Lt hip flexors (declines dry needling), hip flexibility and strength,  long axis  mobs to Lt hip and P/ROM    PT Home Exercise Plan  Access Code: [email or web address]    Consulted and Agree with Plan of Care  Patient       Patient will benefit from skilled therapeutic intervention in order to improve the following deficits and impairments:  Decreased range of motion, Increased fascial restricitons, Increased muscle spasms, Decreased activity tolerance, Decreased strength,  Abnormal gait, Pain, Difficulty walking, Decreased endurance  Visit Diagnosis: Pain in left hip  Cramp and spasm  Muscle weakness (generalized)  Other abnormalities of gait and mobility     Problem List Patient Active Problem List   Diagnosis Date Noted  . Primary osteoarthritis of left hip 11/26/2019  . 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 Rectal pain   . Dizziness 10/25/2018  . Sinus bradycardia 10/25/2018  . Atypical chest pain 10/25/2018  . Dyslipidemia 10/25/2018  . Chronic right shoulder pain 07/04/2018  . Unilateral primary osteoarthritis, left knee 06/14/2018  . Essential hypertension 04/28/2018  . Hypertensive disorder 04/18/2018  . Cholecystitis with cholelithiasis 02/11/2018  . Multiple pulmonary nodules determined by computed tomography of lung 01/26/2018  . Upper airway cough syndrome 01/25/2018     Sigurd Sos, PT 01/30/20 12:00 PM  [facility] [address], [place], [place], [address] Phone: [phone]   Fax:  [phone]  Name: Nancy Christensen MRN: [id number] Date of Birth: [birth year]

## 2020-01-31 MED ORDER — AMLODIPINE BESYLATE 2.5 MG PO TABS: 2.5000 mg | ORAL_TABLET | Freq: Every day | ORAL | 3 refills | Status: DC

## 2020-01-31 NOTE — Telephone Encounter (Signed)
Please lower amlodipine to only 2.5 mg daily and keep watching it.  May be forced to discontinue this medication

## 2020-01-31 NOTE — Telephone Encounter (Signed)
Pt agreed to take the power dose of Amlodipine and will keep track of her edema and BP and let us know after several days how she is doing.

## 2020-02-03 ENCOUNTER — Ambulatory Visit: Payer: Medicare Other | Attending: Internal Medicine

## 2020-02-03 ENCOUNTER — Ambulatory Visit (INDEPENDENT_AMBULATORY_CARE_PROVIDER_SITE_OTHER): Payer: Medicare Other | Admitting: Podiatrist

## 2020-02-03 ENCOUNTER — Encounter: Payer: Self-pay | Admitting: Podiatrist

## 2020-02-03 ENCOUNTER — Other Ambulatory Visit: Payer: Self-pay

## 2020-02-03 DIAGNOSIS — Z20822 Contact with and (suspected) exposure to covid-19: Secondary | ICD-10-CM | POA: Diagnosis not present

## 2020-02-03 DIAGNOSIS — L600 Ingrowing nail: Secondary | ICD-10-CM | POA: Diagnosis not present

## 2020-02-03 NOTE — Patient Instructions (Signed)
Soak Instructions    THE DAY AFTER THE PROCEDURE  Place 1/4 cup of epsom salts in a quart of warm tap water.  Submerge your foot or feet with outer bandage intact for the initial soak; this will allow the bandage to become moist and wet for easy lift off.  Once you remove your bandage, continue to soak in the solution for 20 minutes.  This soak should be done twice a day.  Next, remove your foot or feet from solution, blot dry the affected area and cover.  You may use a band aid large enough to cover the area or use gauze and tape.  Apply other medications to the area as directed by the doctor such as polysporin neosporin.  IF YOUR SKIN BECOMES IRRITATED WHILE USING THESE INSTRUCTIONS, IT IS OKAY TO SWITCH TO  antibacterial soap pump soap (Dial)  and water to keep the toe clean

## 2020-02-03 NOTE — Progress Notes (Signed)
  Chief Complaint  Patient presents with  . Ingrown Toenail    pt is here for a possible ingrown of the right big toenail lateral side, pt states that the nail is painful to the touch.      HPI: Patient is 69 y.o. female who presents today for the concerns as listed above.  Patient states that the ingrown toenail has been painful to the touch and is getting worse.   Review of Systems No fevers, chills, nausea, muscle aches, no difficulty breathing, no calf pain, no chest pain or shortness of breath.   Physical Exam  GENERAL APPEARANCE: Alert, conversant. Appropriately groomed. No acute distress.   VASCULAR: Pedal pulses palpable DP and PT bilateral.  Capillary refill time is immediate to all digits,  Proximal to distal cooling it warm to warm.  Digital hair growth is present bilateral   NEUROLOGIC: sensation is intact epicritically and protectively to 5.07 monofilament at 5/5 sites bilateral.  Light touch is intact bilateral, vibratory sensation intact bilateral, achilles tendon reflex is intact bilateral.   MUSCULOSKELETAL: acceptable muscle strength, tone and stability bilateral.  No gross boney pedal deformities noted.  No pain, crepitus or limitation noted with foot and ankle range of motion bilateral.   DERMATOLOGIC: skin is warm, supple, and dry.  Right great toe lateral border is ingrown and painful with pressure.  Redness, swelling, and evidence of drainage is also noted.  Assessment   Ingrowing right great toenail lateral nail border  Plan  Treatment options and alternatives were discussed. Recommended a permanent removal of the lateral nail border of the right great toenail. Patient agreed. Skin was prepped with alcohol and a local injection of lidocaine and Marcaine plain was infiltrated to anesthetize the toe. The toe was then prepped with Betadine exsanguinated. The offending nail border is removed and phenol applied.  It was cleansed well with alcohol. Antibiotic ointment  and a dressing was then applied and the patient was given instructions for aftercare.

## 2020-02-04 ENCOUNTER — Other Ambulatory Visit: Payer: Self-pay

## 2020-02-04 ENCOUNTER — Ambulatory Visit: Payer: Medicare Other

## 2020-02-04 DIAGNOSIS — R2689 Other abnormalities of gait and mobility: Secondary | ICD-10-CM

## 2020-02-04 DIAGNOSIS — M6281 Muscle weakness (generalized): Secondary | ICD-10-CM | POA: Diagnosis not present

## 2020-02-04 DIAGNOSIS — M25552 Pain in left hip: Secondary | ICD-10-CM | POA: Diagnosis not present

## 2020-02-04 DIAGNOSIS — R252 Cramp and spasm: Secondary | ICD-10-CM

## 2020-02-04 LAB — SARS-COV-2, NAA 2 DAY TAT

## 2020-02-04 LAB — NOVEL CORONAVIRUS, NAA: SARS-CoV-2, NAA: NOT DETECTED

## 2020-02-04 NOTE — Therapy (Signed)
[facility] [address], [place], [place], [address] Phone: [phone]   Fax:  [phone]  Physical Therapy Treatment  Patient Details  Name: Nancy Christensen MRN: [id number] Date of Birth: 02-20-1951 Referring Provider (PT): Eduard Roux, MD   Encounter Date: 02/04/2020  PT End of Session - 02/04/20 1143    Visit Number  4    Date for PT Re-Evaluation  03/17/20    Authorization Type  Medicare BCBS federal    Authorization Time Period  KX on visit #7 (8 visits earlier this year for pelvic floor)    Progress Note Due on Visit  10    PT Start Time  1102    PT Stop Time  1143    PT Time Calculation (min)  41 min    Activity Tolerance  Patient tolerated treatment well    Behavior During Therapy  [facility] for tasks assessed/performed       Past Medical History:  Diagnosis Date  . Arthritis    left hip and knee  . Hyperlipidemia   . Hypertension     Past Surgical History:  Procedure Laterality Date  . ABDOMINAL HYSTERECTOMY  1997  . ANAL RECTAL MANOMETRY N/A 08/09/2019   Procedure: ANO RECTAL MANOMETRY;  Surgeon: Thornton Park, MD;  Location: [facility];  Service: Gastroenterology;  Laterality: N/A;  . BREAST SURGERY  2002   left cyst removal   . CHOLECYSTECTOMY N/A 02/12/2018   Procedure: LAPAROSCOPIC CHOLECYSTECTOMY;  Surgeon: Leighton Ruff, MD;  Location: [facility];  Service: General;  Laterality: N/A;  . COLONOSCOPY    . POLYPECTOMY      There were no vitals filed for this visit.  Subjective Assessment - 02/04/20 1107    Subjective  I had more stiffness and pain in my left hip on Sunday.  Not sure why it was hurting.    Currently in Pain?  No/denies    Pain Score  --   up to 6/10 over the weekend.                      [facility] Treatment/Exercise - 02/04/20 0001      Lumbar Exercises: Stretches   Other Lumbar Stretch Exercise  hip flexor stretch in standing and supine 3x20  second each      Lumbar Exercises: Aerobic   Nustep  8 min, level 2 while assessing patient      Lumbar Exercises: Supine   Bridge  --    Bridge with [employer]  20 reps;5 seconds      Lumbar Exercises: Sidelying   Clam  Both;20 reps    Clam Limitations  verbal cues for alignment and core/pelvic floor contraction      Knee/Hip Exercises: Standing   Step Down  --    Step Down Limitations  --    Rebounder  3 way weight shift x1 minute each      Knee/Hip Exercises: Prone   Hip Extension  Strengthening;Both;2 sets;10 reps      Manual Therapy   Manual Therapy  Joint mobilization;Soft tissue mobilization;Myofascial release    Manual therapy comments  joint mobilization to Lt hip with long axis pull and Addaday to Lt hip flexors               PT Short Term Goals - 01/21/20 1122      PT SHORT TERM GOAL #1   Title  be independent in intial HEP  Period  Weeks    Status  New    Target Date  02/18/20      PT SHORT TERM GOAL #2   Title  report a 30% reduction in Lt hip pain with standing and walking    Time  4    Period  Weeks    Status  New    Target Date  02/18/20      PT SHORT TERM GOAL #3   Title  reduce Lt hip pain to walk for 3-4 miles for exercise without limitation due to pain or fatigue    Time  4    Period  Weeks    Status  New    Target Date  02/18/20        PT Long Term Goals - 01/21/20 1312      PT LONG TERM GOAL #1   Title  independent with advanced HEP    Time  8    Period  Weeks    Status  New    Target Date  03/17/20      PT LONG TERM GOAL #2   Title  reduce FOTO to < = to 23% limitation    Baseline  --    Time  8    Period  Weeks    Status  On-going    Target Date  03/17/20      PT LONG TERM GOAL #3   Title  report a 70% reduction in Lt hip pain with standing and walking    Baseline  --    Time  8    Period  Weeks    Status  New    Target Date  03/17/20      PT LONG TERM GOAL #4   Title  walk 5 miles for exercise to  without limitation due to Lt hip pain or fatigue    Time  8    Period  Weeks    Status  New    Target Date  03/17/20      PT LONG TERM GOAL #5   Title  demonstrate symmetry with gait on level surface    Time  8    Period  Weeks    Status  New    Target Date  03/17/20            Plan - 02/04/20 1119    Clinical Impression Statement  Pt denies any pain in the Lt hip, only stiffness today.  Pt had increased pain and stiffness 2 days ago with unknown cause (6/10 reported).   Pt with stiffness in Lt hip joint and responded well to manual therapy to address tissue mobility at the Lt quads and hip flexors and joint mobility.  Pt required minor verbal cues to address alignment with exercise. Pt will continue to benefit from skilled PT to address Lt hip pain, strength and flexibility.    Stability/Clinical Decision Making  Stable/Uncomplicated    PT Frequency  2x / week    PT Duration  8 weeks    PT Treatment/Interventions  Biofeedback;Therapeutic activities;Therapeutic exercise;Neuromuscular re-education;Manual techniques;Patient/family education;Dry needling;ADLs/Self Care Home Management;Ultrasound;Moist Heat;Electrical Stimulation;Passive range of motion;Joint Manipulations    PT Next Visit Plan  manual to Lt hip flexors (declines dry needling), hip flexibility and strength,  long axis mobs to Lt hip and P/ROM    PT Home Exercise Plan  Access Code: [email or web address]    Consulted and Agree with Plan of Care  Patient  Patient will benefit from skilled therapeutic intervention in order to improve the following deficits and impairments:  Decreased range of motion, Increased fascial restricitons, Increased muscle spasms, Decreased activity tolerance, Decreased strength, Abnormal gait, Pain, Difficulty walking, Decreased endurance  Visit Diagnosis: Pain in left hip  Cramp and spasm  Muscle weakness (generalized)  Other abnormalities of gait and mobility     Problem List Patient Active  Problem List   Diagnosis Date Noted  . Primary osteoarthritis of left hip 11/26/2019  . 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 Rectal pain   . Dizziness 10/25/2018  . Sinus bradycardia 10/25/2018  . Atypical chest pain 10/25/2018  . Dyslipidemia 10/25/2018  . Chronic right shoulder pain 07/04/2018  . Unilateral primary osteoarthritis, left knee 06/14/2018  . Essential hypertension 04/28/2018  . Hypertensive disorder 04/18/2018  . Cholecystitis with cholelithiasis 02/11/2018  . Multiple pulmonary nodules determined by computed tomography of lung 01/26/2018  . Upper airway cough syndrome 01/25/2018    Sigurd Sos, PT 02/04/20 11:48 AM  [facility] [address], [place], [place], [address] Phone: [phone]   Fax:  [phone]  Name: Nancy Christensen MRN: [id number] Date of Birth: Jan 09, 1951

## 2020-02-05 DIAGNOSIS — H43813 Vitreous degeneration, bilateral: Secondary | ICD-10-CM | POA: Diagnosis not present

## 2020-02-06 ENCOUNTER — Ambulatory Visit: Payer: Medicare Other

## 2020-02-06 ENCOUNTER — Other Ambulatory Visit: Payer: Self-pay

## 2020-02-06 DIAGNOSIS — M6281 Muscle weakness (generalized): Secondary | ICD-10-CM

## 2020-02-06 DIAGNOSIS — R2689 Other abnormalities of gait and mobility: Secondary | ICD-10-CM

## 2020-02-06 DIAGNOSIS — R252 Cramp and spasm: Secondary | ICD-10-CM

## 2020-02-06 DIAGNOSIS — M25552 Pain in left hip: Secondary | ICD-10-CM

## 2020-02-06 NOTE — Therapy (Signed)
[facility] [address], [place], [place], [address] Phone: [phone]   Fax:  [phone]  Physical Therapy Treatment  Patient Details  Name: Nancy Christensen MRN: [id number] Date of Birth: 1950-10-31 Referring Provider (PT): Eduard Roux, MD   Encounter Date: 02/06/2020  PT End of Session - 02/06/20 1145    Visit Number  5    Date for PT Re-Evaluation  03/17/20    Authorization Type  Medicare BCBS federal    Authorization Time Period  KX on visit #7 (8 visits earlier this year for pelvic floor)    Progress Note Due on Visit  10    PT Start Time  1101    PT Stop Time  1144    PT Time Calculation (min)  43 min    Activity Tolerance  Patient tolerated treatment well    Behavior During Therapy  [facility] for tasks assessed/performed       Past Medical History:  Diagnosis Date  . Arthritis    left hip and knee  . Hyperlipidemia   . Hypertension     Past Surgical History:  Procedure Laterality Date  . ABDOMINAL HYSTERECTOMY  1997  . ANAL RECTAL MANOMETRY N/A 08/09/2019   Procedure: ANO RECTAL MANOMETRY;  Surgeon: Thornton Park, MD;  Location: [facility];  Service: Gastroenterology;  Laterality: N/A;  . BREAST SURGERY  2002   left cyst removal   . CHOLECYSTECTOMY N/A 02/12/2018   Procedure: LAPAROSCOPIC CHOLECYSTECTOMY;  Surgeon: Leighton Ruff, MD;  Location: [facility];  Service: General;  Laterality: N/A;  . COLONOSCOPY    . POLYPECTOMY      There were no vitals filed for this visit.  Subjective Assessment - 02/06/20 1106    Subjective  I felt good after last session.  I am just stiff.    Currently in Pain?  No/denies                        [facility] Adult PT Treatment/Exercise - 02/06/20 0001      Lumbar Exercises: Stretches   Active Hamstring Stretch  Left;3 reps;20 seconds      Lumbar Exercises: Aerobic   Nustep  8 min, level 2 while assessing patient      Lumbar Exercises: Supine    Straight Leg Raise  20 reps    Straight Leg Raises Limitations  Lt only      Knee/Hip Exercises: Seated   Sit to Sand  2 sets;10 reps;without UE support   ball between the knees     Knee/Hip Exercises: Prone   Hip Extension  Strengthening;Both;2 sets;10 reps      Manual Therapy   Manual Therapy  Joint mobilization;Soft tissue mobilization;Myofascial release    Manual therapy comments  joint mobilization to Lt hip with long axis pull and Addaday to Lt hip flexors               PT Short Term Goals - 02/06/20 1107      PT SHORT TERM GOAL #1   Title  be independent in intial HEP    Status  Achieved      PT SHORT TERM GOAL #2   Title  report a 30% reduction in Lt hip pain with standing and walking    Baseline  70%    Status  Achieved      PT SHORT TERM GOAL #3   Title  reduce Lt hip pain to walk for 3-4  miles for exercise without limitation due to pain or fatigue    Baseline  not walking due to ingrown toe is healing    Time  4    Period  Weeks    Status  On-going        PT Long Term Goals - 01/21/20 1312      PT LONG TERM GOAL #1   Title  independent with advanced HEP    Time  8    Period  Weeks    Status  New    Target Date  03/17/20      PT LONG TERM GOAL #2   Title  reduce FOTO to < = to 23% limitation    Baseline  --    Time  8    Period  Weeks    Status  On-going    Target Date  03/17/20      PT LONG TERM GOAL #3   Title  report a 70% reduction in Lt hip pain with standing and walking    Baseline  --    Time  8    Period  Weeks    Status  New    Target Date  03/17/20      PT LONG TERM GOAL #4   Title  walk 5 miles for exercise to without limitation due to Lt hip pain or fatigue    Time  8    Period  Weeks    Status  New    Target Date  03/17/20      PT LONG TERM GOAL #5   Title  demonstrate symmetry with gait on level surface    Time  8    Period  Weeks    Status  New    Target Date  03/17/20            Plan - 02/06/20  1115    Clinical Impression Statement  Pt reports a 70% reduction in Lt hip pain since the start of care. Pt denies any pain in the Lt hip, only stiffness today.  Session focused on stability, strength and flexibility of the Lt hip.    Pt with stiffness in Lt hip joint and responded well to manual therapy to address tissue mobility at the Lt quads and hip flexors and joint mobility.  Pt required minor verbal cues to address alignment with exercise.  Pt has not been walking for exercise due to healing from ingrown toe nail removal.  Pt will continue to benefit from skilled PT to address Lt hip pain, strength and flexibility.    Rehab Potential  Good    PT Frequency  2x / week    PT Duration  8 weeks    PT Treatment/Interventions  Biofeedback;Therapeutic activities;Therapeutic exercise;Neuromuscular re-education;Manual techniques;Patient/family education;Dry needling;ADLs/Self Care Home Management;Ultrasound;Moist Heat;Electrical Stimulation;Passive range of motion;Joint Manipulations    PT Next Visit Plan  manual to Lt hip flexors (declines dry needling), hip flexibility and strength,  long axis mobs to Lt hip and P/ROM    PT Home Exercise Plan  Access Code: [email or web address]    Consulted and Agree with Plan of Care  Patient       Patient will benefit from skilled therapeutic intervention in order to improve the following deficits and impairments:  Decreased range of motion, Increased fascial restricitons, Increased muscle spasms, Decreased activity tolerance, Decreased strength, Abnormal gait, Pain, Difficulty walking, Decreased endurance  Visit Diagnosis: Cramp and spasm  Pain in left hip  Muscle weakness (generalized)  Other abnormalities of gait and mobility     Problem List Patient Active Problem List   Diagnosis Date Noted  . Primary osteoarthritis of left hip 11/26/2019  . 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 Rectal pain   . Dizziness 10/25/2018  . Sinus bradycardia 10/25/2018  .  Atypical chest pain 10/25/2018  . Dyslipidemia 10/25/2018  . Chronic right shoulder pain 07/04/2018  . Unilateral primary osteoarthritis, left knee 06/14/2018  . Essential hypertension 04/28/2018  . Hypertensive disorder 04/18/2018  . Cholecystitis with cholelithiasis 02/11/2018  . Multiple pulmonary nodules determined by computed tomography of lung 01/26/2018  . Upper airway cough syndrome 01/25/2018    Sigurd Sos, PT 02/06/20 11:48 AM  [facility] [address] [place], [place], [address] Phone: [phone]   Fax:  [phone]  Name: Nancy Christensen MRN: [id number] Date of Birth: [birth year]

## 2020-02-11 ENCOUNTER — Other Ambulatory Visit: Payer: Self-pay

## 2020-02-11 ENCOUNTER — Ambulatory Visit: Payer: Medicare Other

## 2020-02-11 DIAGNOSIS — R2689 Other abnormalities of gait and mobility: Secondary | ICD-10-CM | POA: Diagnosis not present

## 2020-02-11 DIAGNOSIS — I8311 Varicose veins of right lower extremity with inflammation: Secondary | ICD-10-CM | POA: Diagnosis not present

## 2020-02-11 DIAGNOSIS — R252 Cramp and spasm: Secondary | ICD-10-CM

## 2020-02-11 DIAGNOSIS — M7981 Nontraumatic hematoma of soft tissue: Secondary | ICD-10-CM | POA: Diagnosis not present

## 2020-02-11 DIAGNOSIS — M6281 Muscle weakness (generalized): Secondary | ICD-10-CM | POA: Diagnosis not present

## 2020-02-11 DIAGNOSIS — M25552 Pain in left hip: Secondary | ICD-10-CM | POA: Diagnosis not present

## 2020-02-11 NOTE — Therapy (Signed)
[facility] [address] [place], [place], [address] Phone: [phone]   Fax:  [phone]  Physical Therapy Treatment  Patient Details  Name: Nancy Christensen MRN: [id number] Date of Birth: [birth year] Referring Provider (PT): Eduard Roux, MD   Encounter Date: 02/11/2020  PT End of Session - 02/11/20 1143    Visit Number  6    Date for PT Re-Evaluation  03/17/20    Authorization Type  Medicare BCBS federal    Authorization Time Period  KX on visit #7 (8 visits earlier this year for pelvic floor)    Progress Note Due on Visit  10    PT Start Time  1100    PT Stop Time  1142    PT Time Calculation (min)  42 min    Activity Tolerance  Patient tolerated treatment well    Behavior During Therapy  [facility] for tasks assessed/performed       Past Medical History:  Diagnosis Date  . Arthritis    left hip and knee  . Hyperlipidemia   . Hypertension     Past Surgical History:  Procedure Laterality Date  . ABDOMINAL HYSTERECTOMY  1997  . ANAL RECTAL MANOMETRY N/A 08/09/2019   Procedure: ANO RECTAL MANOMETRY;  Surgeon: Thornton Park, MD;  Location: [facility];  Service: Gastroenterology;  Laterality: N/A;  . BREAST SURGERY  2002   left cyst removal   . CHOLECYSTECTOMY N/A 02/12/2018   Procedure: LAPAROSCOPIC CHOLECYSTECTOMY;  Surgeon: Leighton Ruff, MD;  Location: [facility];  Service: General;  Laterality: N/A;  . COLONOSCOPY    . POLYPECTOMY      There were no vitals filed for this visit.  Subjective Assessment - 02/11/20 1101    Subjective  I feel the front of my Lt hip when i lift my leg to take a step.    Currently in Pain?  No/denies    Pain Score  --   up to 6/10   Pain Location  Hip    Aggravating Factors   stepping up, hip flexion, bending over    Pain Relieving Factors  Tylenol, topical rub                        [facility] Adult PT Treatment/Exercise - 02/11/20 0001      Lumbar  Exercises: Stretches   Active Hamstring Stretch  Left;3 reps;20 seconds      Lumbar Exercises: Aerobic   Nustep  8 min, level 2 while assessing patient      Knee/Hip Exercises: Standing   Rebounder  3 way weight shift x1 minute each    Other Standing Knee Exercises  Lt limb stance with vectors using sliders on the Rt 2x5 each direction      Knee/Hip Exercises: Seated   Sit to Sand  2 sets;10 reps;without UE support   ball between the knees     Knee/Hip Exercises: Supine   Bridges with Greig Right  Strengthening;Both;2 sets;10 reps      Manual Therapy   Manual Therapy  Joint mobilization;Soft tissue mobilization;Myofascial release    Manual therapy comments  joint mobilization to Lt hip with long axis pull and Addaday to Lt hip flexors               PT Short Term Goals - 02/06/20 1107      PT SHORT TERM GOAL #1   Title  be independent in intial HEP  Status  Achieved      PT SHORT TERM GOAL #2   Title  report a 30% reduction in Lt hip pain with standing and walking    Baseline  70%    Status  Achieved      PT SHORT TERM GOAL #3   Title  reduce Lt hip pain to walk for 3-4 miles for exercise without limitation due to pain or fatigue    Baseline  not walking due to ingrown toe is healing    Time  4    Period  Weeks    Status  On-going        PT Long Term Goals - 02/11/20 1115      PT LONG TERM GOAL #3   Title  report a 70% reduction in Lt hip pain with standing and walking    Status  Achieved            Plan - 02/11/20 1124    Clinical Impression Statement  Pt with Lt anterior hip pain with hip flexion and stepping up on a step.  Pt denies any pain today and pain increases to 6/10 with the aggravating motion.  Pt reports 70% overall improvement in Lt hip pain since the start of care.  Pt is challenged by proprioceptive exercises today. Pt with tension and palpable tenderness at Lt proximal hip flexors and reported less tenderness after manual therapy  today.  Pt will continue to benefit from skilled PT to address Lt hip pain.    PT Frequency  2x / week    PT Duration  8 weeks    PT Treatment/Interventions  Biofeedback;Therapeutic activities;Therapeutic exercise;Neuromuscular re-education;Manual techniques;Patient/family education;Dry needling;ADLs/Self Care Home Management;Ultrasound;Moist Heat;Electrical Stimulation;Passive range of motion;Joint Manipulations    PT Next Visit Plan  manual to Lt hip flexors (declines dry needling), hip flexibility and strength,  long axis mobs to Lt hip and P/ROM    PT Home Exercise Plan  Access Code: [email or web address]    Consulted and Agree with Plan of Care  Patient       Patient will benefit from skilled therapeutic intervention in order to improve the following deficits and impairments:  Decreased range of motion, Increased fascial restricitons, Increased muscle spasms, Decreased activity tolerance, Decreased strength, Abnormal gait, Pain, Difficulty walking, Decreased endurance  Visit Diagnosis: Cramp and spasm  Pain in left hip  Muscle weakness (generalized)  Other abnormalities of gait and mobility     Problem List Patient Active Problem List   Diagnosis Date Noted  . Primary osteoarthritis of left hip 11/26/2019  . 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 Rectal pain   . Dizziness 10/25/2018  . Sinus bradycardia 10/25/2018  . Atypical chest pain 10/25/2018  . Dyslipidemia 10/25/2018  . Chronic right shoulder pain 07/04/2018  . Unilateral primary osteoarthritis, left knee 06/14/2018  . Essential hypertension 04/28/2018  . Hypertensive disorder 04/18/2018  . Cholecystitis with cholelithiasis 02/11/2018  . Multiple pulmonary nodules determined by computed tomography of lung 01/26/2018  . Upper airway cough syndrome 01/25/2018     Sigurd Sos, PT 02/11/20 11:45 AM  [facility] [address] [place], [place],  [address] Phone: [phone]   Fax:  [phone]  Name: Nancy Christensen MRN: [id number] Date of Birth: 06-Sep-1951

## 2020-02-13 ENCOUNTER — Ambulatory Visit: Payer: Medicare Other

## 2020-02-13 ENCOUNTER — Other Ambulatory Visit: Payer: Self-pay

## 2020-02-13 DIAGNOSIS — M25552 Pain in left hip: Secondary | ICD-10-CM

## 2020-02-13 DIAGNOSIS — R252 Cramp and spasm: Secondary | ICD-10-CM | POA: Diagnosis not present

## 2020-02-13 DIAGNOSIS — M6281 Muscle weakness (generalized): Secondary | ICD-10-CM | POA: Diagnosis not present

## 2020-02-13 DIAGNOSIS — R2689 Other abnormalities of gait and mobility: Secondary | ICD-10-CM | POA: Diagnosis not present

## 2020-02-13 NOTE — Therapy (Signed)
[facility] [address], [place], [place], [address] Phone: [phone]   Fax:  [phone]  Physical Therapy Treatment  Patient Details  Name: Genesee Affeldt MRN: [id number] Date of Birth: 1950/12/28 Referring Provider (PT): Eduard Roux, MD   Encounter Date: 02/13/2020  PT End of Session - 02/13/20 1142    Visit Number  7    Date for PT Re-Evaluation  03/17/20    Authorization Type  Medicare BCBS federal    Authorization Time Period  KX on visit #7 (8 visits earlier this year for pelvic floor)    Progress Note Due on Visit  10    PT Start Time  1100    PT Stop Time  1141    PT Time Calculation (min)  41 min    Activity Tolerance  Patient tolerated treatment well    Behavior During Therapy  [facility] for tasks assessed/performed       Past Medical History:  Diagnosis Date  . Arthritis    left hip and knee  . Hyperlipidemia   . Hypertension     Past Surgical History:  Procedure Laterality Date  . ABDOMINAL HYSTERECTOMY  1997  . ANAL RECTAL MANOMETRY N/A 08/09/2019   Procedure: ANO RECTAL MANOMETRY;  Surgeon: Thornton Park, MD;  Location: [facility];  Service: Gastroenterology;  Laterality: N/A;  . BREAST SURGERY  2002   left cyst removal   . CHOLECYSTECTOMY N/A 02/12/2018   Procedure: LAPAROSCOPIC CHOLECYSTECTOMY;  Surgeon: Leighton Ruff, MD;  Location: [facility];  Service: General;  Laterality: N/A;  . COLONOSCOPY    . POLYPECTOMY      There were no vitals filed for this visit.  Subjective Assessment - 02/13/20 1146    Subjective  No significant change this week.  Still with localized pain in the left anterior thigh.    Currently in Pain?  Yes    Pain Score  4     Pain Location  Hip                        [facility] Adult PT Treatment/Exercise - 02/13/20 0001      Lumbar Exercises: Stretches   Quad Stretch  Right;3 reps;20 seconds    Quad Stretch Limitations  using green strap in  prone      Lumbar Exercises: Aerobic   Nustep  8 min, level 2 while assessing patient      Knee/Hip Exercises: Standing   Hip Abduction  Stengthening;Both;2 sets;10 reps    Hip Extension  Stengthening;Both;2 sets;10 reps    Other Standing Knee Exercises  tandem stance on black pad 2x20 seconds Rt and Lt each      Knee/Hip Exercises: Supine   Bridges with [employer]  Strengthening;Both;2 sets;10 reps      Manual Therapy   Manual Therapy  Joint mobilization;Soft tissue mobilization;Myofascial release    Manual therapy comments  joint mobilization to Lt hip with long axis pull and Addaday to Lt hip flexors    Joint Mobilization  passive stretch into IR/ER and extension in prone                PT Short Term Goals - 02/06/20 1107      PT SHORT TERM GOAL #1   Title  be independent in intial HEP    Status  Achieved      PT SHORT TERM GOAL #2   Title  report a 30% reduction in  Lt hip pain with standing and walking    Baseline  70%    Status  Achieved      PT SHORT TERM GOAL #3   Title  reduce Lt hip pain to walk for 3-4 miles for exercise without limitation due to pain or fatigue    Baseline  not walking due to ingrown toe is healing    Time  4    Period  Weeks    Status  On-going        PT Long Term Goals - 02/11/20 1115      PT LONG TERM GOAL #3   Title  report a 70% reduction in Lt hip pain with standing and walking    Status  Achieved            Plan - 02/13/20 1121    Clinical Impression Statement  Pt denies any significant changes this week.  Pt reports 70% reduction in Lt hip pain overall.  Pt experiences Lt hip flexor pain with lifting left leg forward and with squatting.  Pt with localized palpable tenderness over proximal Lt quads.  Pt declines dry needling at this time.  Pt tolerated advancement of hip strength in standing today with some increased pain with Lt hip abduction.  Pt requires minor tactile and verbal cues for alignment and technique  with exercises today.  Pt will continue to benefit from skilled PT to address Lt hip pain, strength and flexibility.    Rehab Potential  Good    PT Frequency  2x / week    PT Duration  8 weeks    PT Treatment/Interventions  Biofeedback;Therapeutic activities;Therapeutic exercise;Neuromuscular re-education;Manual techniques;Patient/family education;Dry needling;ADLs/Self Care Home Management;Ultrasound;Moist Heat;Electrical Stimulation;Passive range of motion;Joint Manipulations    PT Next Visit Plan  manual to Lt hip flexors (declines dry needling), hip flexibility and strength,  long axis mobs to Lt hip and P/ROM    PT Home Exercise Plan  Access Code: [email or web address]    Consulted and Agree with Plan of Care  Patient       Patient will benefit from skilled therapeutic intervention in order to improve the following deficits and impairments:  Decreased range of motion, Increased fascial restricitons, Increased muscle spasms, Decreased activity tolerance, Decreased strength, Abnormal gait, Pain, Difficulty walking, Decreased endurance  Visit Diagnosis: Pain in left hip  Cramp and spasm  Muscle weakness (generalized)  Other abnormalities of gait and mobility     Problem List Patient Active Problem List   Diagnosis Date Noted  . Primary osteoarthritis of left hip 11/26/2019  . 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 Rectal pain   . Dizziness 10/25/2018  . Sinus bradycardia 10/25/2018  . Atypical chest pain 10/25/2018  . Dyslipidemia 10/25/2018  . Chronic right shoulder pain 07/04/2018  . Unilateral primary osteoarthritis, left knee 06/14/2018  . Essential hypertension 04/28/2018  . Hypertensive disorder 04/18/2018  . Cholecystitis with cholelithiasis 02/11/2018  . Multiple pulmonary nodules determined by computed tomography of lung 01/26/2018  . Upper airway cough syndrome 01/25/2018    Sigurd Sos, PT 02/13/20 11:46 AM  [facility] [address], [place], [place], [address] Phone: [phone]   Fax:  [phone]  Name: Dkayla Mcninch MRN: [id number] Date of Birth: May 20, 1951

## 2020-02-14 ENCOUNTER — Ambulatory Visit (INDEPENDENT_AMBULATORY_CARE_PROVIDER_SITE_OTHER): Payer: Medicare Other | Admitting: Podiatry

## 2020-02-14 DIAGNOSIS — L600 Ingrowing nail: Secondary | ICD-10-CM

## 2020-02-14 NOTE — Progress Notes (Signed)
  Subjective:  Patient ID: Nancy Christensen, female    DOB: 24-Mar-1951,  MRN: [id number]  Chief Complaint  Patient presents with  . Nail Problem    pt is here for a f/u on right great toenail lateral side ingrown toenail, pt states that she is doing better, but states that she is still feeling some tender pain, pt states that she is concerned about the dried blood on her right great toenail not going away.    69 y.o. female presents for follow up of nail procedure. History confirmed with patient.   Objective:  Physical Exam: Ingrown nail avulsion site: overlying soft crust, no warmth, no drainage and no erythema Assessment:   1. Ingrown nail      Plan:  Patient was evaluated and treated and all questions answered.  S/p Ingrown Toenail Excision, right -Healing well without issue. -Discussed return precautions. -F/u PRN

## 2020-03-03 ENCOUNTER — Other Ambulatory Visit: Payer: Self-pay

## 2020-03-03 ENCOUNTER — Ambulatory Visit: Payer: Medicare Other | Attending: Gastroenterology

## 2020-03-03 DIAGNOSIS — M6281 Muscle weakness (generalized): Secondary | ICD-10-CM | POA: Diagnosis present

## 2020-03-03 DIAGNOSIS — R2689 Other abnormalities of gait and mobility: Secondary | ICD-10-CM | POA: Diagnosis present

## 2020-03-03 DIAGNOSIS — M25552 Pain in left hip: Secondary | ICD-10-CM | POA: Diagnosis present

## 2020-03-03 DIAGNOSIS — R252 Cramp and spasm: Secondary | ICD-10-CM | POA: Diagnosis present

## 2020-03-03 NOTE — Therapy (Signed)
[facility] [address] [place], [place], [address] Phone: [phone]   Fax:  [phone]  Physical Therapy Treatment  Patient Details  Name: Nancy Christensen MRN: [id number] Date of Birth: 01-01-1951 Referring Provider (PT): Eduard Roux, MD   Encounter Date: 03/03/2020  PT End of Session - 03/03/20 1143    Visit Number  8    Date for PT Re-Evaluation  03/17/20    Authorization Type  Medicare BCBS federal    Authorization Time Period  KX on visit #7 (8 visits earlier this year for pelvic floor)    Progress Note Due on Visit  10    PT Start Time  1100    PT Stop Time  1143    PT Time Calculation (min)  43 min    Activity Tolerance  Patient tolerated treatment well    Behavior During Therapy  [facility] for tasks assessed/performed       Past Medical History:  Diagnosis Date  . Arthritis    left hip and knee  . Hyperlipidemia   . Hypertension     Past Surgical History:  Procedure Laterality Date  . ABDOMINAL HYSTERECTOMY  1997  . ANAL RECTAL MANOMETRY N/A 08/09/2019   Procedure: ANO RECTAL MANOMETRY;  Surgeon: Thornton Park, MD;  Location: [facility];  Service: Gastroenterology;  Laterality: N/A;  . BREAST SURGERY  2002   left cyst removal   . CHOLECYSTECTOMY N/A 02/12/2018   Procedure: LAPAROSCOPIC CHOLECYSTECTOMY;  Surgeon: Leighton Ruff, MD;  Location: [facility];  Service: General;  Laterality: N/A;  . COLONOSCOPY    . POLYPECTOMY      There were no vitals filed for this visit.  Subjective Assessment - 03/03/20 1059    Subjective  My hip is still stiff.  It is worst when i am stepping forward of lifting my Lt leg.    Currently in Pain?  Yes    Pain Score  5     Pain Location  Hip    Pain Orientation  Left    Pain Descriptors / Indicators  Tightness;Grimacing    Pain Type  Chronic pain    Pain Onset  More than a month ago    Pain Frequency  Intermittent    Aggravating Factors   stepping up, hip  flexion, bending over    Pain Relieving Factors  tylenol, topical rub, stretching                        [facility] Adult PT Treatment/Exercise - 03/03/20 0001      Lumbar Exercises: Stretches   Active Hamstring Stretch  Left;3 reps;20 seconds    Active Hamstring Stretch Limitations  seated and supine with  stretch with abduction to stretch hip adductors 3x20 seconds      Lumbar Exercises: Aerobic   Nustep  8 min, level 2 while assessing patient      Knee/Hip Exercises: Standing   Hip Abduction  Stengthening;Both;2 sets;10 reps    Hip Extension  Stengthening;Both;2 sets;10 reps      Knee/Hip Exercises: Supine   Bridges with [employer]  Strengthening;Both;2 sets;10 reps      Manual Therapy   Manual Therapy  Joint mobilization;Soft tissue mobilization;Myofascial release    Manual therapy comments  --    Joint Mobilization  addaday to Lt hip flexors and lateral quad             PT Education - 03/03/20 1118  Education Details  Access Code: Public relations account executive) Educated  Patient    Methods  Explanation;Demonstration;Handout    Comprehension  Verbalized understanding;Returned demonstration       PT Short Term Goals - 03/03/20 1105      PT SHORT TERM GOAL #2   Title  report a 30% reduction in Lt hip pain with standing and walking    Baseline  70%    Status  Achieved      PT SHORT TERM GOAL #3   Title  reduce Lt hip pain to walk for 3-4 miles for exercise without limitation due to pain or fatigue    Baseline  walking 45 minutes to an hour-2.5 miles    Time  4    Period  Weeks    Status  On-going        PT Long Term Goals - 02/11/20 1115      PT LONG TERM GOAL #3   Title  report a 70% reduction in Lt hip pain with standing and walking    Status  Achieved            Plan - 03/03/20 1115    Clinical Impression Statement  Pt with lapse in treatment since 02/13/20.  Pt continues to report 70% overall improvement and 5/10 Lt hip pain today.   Pt has been able to return to walking 45 minutes to 1 hour (2.5 miles).  Pt has remained consistent with HEP.  Pt declines dry needling at this time.  Pt tolerated advancement of hip strength and didn't have significant increase in Lt hip pain with this today.   Pt requires minor tactile and verbal cues for alignment and technique with exercises today.  Pt will continue to benefit from skilled PT to address Lt hip pain, strength and flexibility.    PT Frequency  2x / week    PT Duration  8 weeks    PT Treatment/Interventions  Biofeedback;Therapeutic activities;Therapeutic exercise;Neuromuscular re-education;Manual techniques;Patient/family education;Dry needling;ADLs/Self Care Home Management;Ultrasound;Moist Heat;Electrical Stimulation;Passive range of motion;Joint Manipulations    PT Next Visit Plan  manual to Lt hip flexors (declines dry needling), hip flexibility and strength,  manual to Lt hip and P/ROM    PT Home Exercise Plan  Access Code: [email or web address]    Consulted and Agree with Plan of Care  Patient       Patient will benefit from skilled therapeutic intervention in order to improve the following deficits and impairments:  Decreased range of motion, Increased fascial restricitons, Increased muscle spasms, Decreased activity tolerance, Decreased strength, Abnormal gait, Pain, Difficulty walking, Decreased endurance  Visit Diagnosis: Cramp and spasm  Pain in left hip  Muscle weakness (generalized)  Other abnormalities of gait and mobility     Problem List Patient Active Problem List   Diagnosis Date Noted  . Primary osteoarthritis of left hip 11/26/2019  . 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 Rectal pain   . Dizziness 10/25/2018  . Sinus bradycardia 10/25/2018  . Atypical chest pain 10/25/2018  . Dyslipidemia 10/25/2018  . Chronic right shoulder pain 07/04/2018  . Unilateral primary osteoarthritis, left knee 06/14/2018  . Essential hypertension 04/28/2018  . Hypertensive  disorder 04/18/2018  . Cholecystitis with cholelithiasis 02/11/2018  . Multiple pulmonary nodules determined by computed tomography of lung 01/26/2018  . Upper airway cough syndrome 01/25/2018     Sigurd Sos, PT 03/03/20 11:45 AM  [facility] [address], [place], [place],  [address] Phone: [phone]   Fax:  [phone]  Name: Nancy Christensen MRN: [id number] Date of Birth: 06/18/1951

## 2020-03-03 NOTE — Patient Instructions (Signed)
Access Code: [email or web address] URL: [email or web address] Date: 03/03/2020 Prepared by: Claiborne Billings  Exercises  Standing Hip Abduction with Unilateral Counter Support - 1 x daily - 7 x weekly - 2 sets - 10 reps Standing Hip Extension with Counter Support - 1 x daily - 7 x weekly - 2 sets - 10 reps

## 2020-03-10 ENCOUNTER — Ambulatory Visit: Payer: Medicare Other

## 2020-03-10 ENCOUNTER — Other Ambulatory Visit: Payer: Self-pay

## 2020-03-10 DIAGNOSIS — M6281 Muscle weakness (generalized): Secondary | ICD-10-CM

## 2020-03-10 DIAGNOSIS — R2689 Other abnormalities of gait and mobility: Secondary | ICD-10-CM

## 2020-03-10 DIAGNOSIS — R252 Cramp and spasm: Secondary | ICD-10-CM | POA: Diagnosis not present

## 2020-03-10 DIAGNOSIS — M25552 Pain in left hip: Secondary | ICD-10-CM

## 2020-03-10 NOTE — Therapy (Signed)
[facility] [address] [place], [place], [address] Phone: [phone]   Fax:  [phone]  Physical Therapy Treatment  Patient Details  Name: Nancy Christensen MRN: [id number] Date of Birth: 1951-03-09 Referring Provider (PT): Eduard Roux, MD   Encounter Date: 03/10/2020   PT End of Session - 03/10/20 1145    Visit Number 9    Date for PT Re-Evaluation 03/17/20    Authorization Type Medicare BCBS federal    Authorization Time Period KX on visit #7 (8 visits earlier this year for pelvic floor)    Progress Note Due on Visit 10    PT Start Time 1102    PT Stop Time 1145    PT Time Calculation (min) 43 min    Activity Tolerance Patient tolerated treatment well    Behavior During Therapy [facility] for tasks assessed/performed           Past Medical History:  Diagnosis Date  . Arthritis    left hip and knee  . Hyperlipidemia   . Hypertension     Past Surgical History:  Procedure Laterality Date  . ABDOMINAL HYSTERECTOMY  1997  . ANAL RECTAL MANOMETRY N/A 08/09/2019   Procedure: ANO RECTAL MANOMETRY;  Surgeon: Thornton Park, MD;  Location: [facility];  Service: Gastroenterology;  Laterality: N/A;  . BREAST SURGERY  2002   left cyst removal   . CHOLECYSTECTOMY N/A 02/12/2018   Procedure: LAPAROSCOPIC CHOLECYSTECTOMY;  Surgeon: Leighton Ruff, MD;  Location: [facility];  Service: General;  Laterality: N/A;  . COLONOSCOPY    . POLYPECTOMY      There were no vitals filed for this visit.   Subjective Assessment - 03/10/20 1117    Subjective I feel 70% overall improvement in my Lt hip.  My pain is worse with lifting the Lt leg.    Currently in Pain? Yes    Pain Score 5     Pain Location Hip    Pain Orientation Left    Pain Descriptors / Indicators Tightness    Pain Type Chronic pain    Pain Onset More than a month ago    Pain Frequency Intermittent    Aggravating Factors  stepping up, hip flexion, bending  over    Pain Relieving Factors stretching, topical rub, Tylenol              [facility] PT Assessment - 03/10/20 0001      Assessment   Medical Diagnosis Primary OA of the Lt hip    Referring Provider (PT) Eduard Roux, MD    Onset Date/Surgical Date 08/23/19      Cognition   Overall Cognitive Status Within Functional Limits for tasks assessed      Observation/Other Assessments   Focus on Therapeutic Outcomes (FOTO)  15% limitation      PROM   Overall PROM  Within functional limits for tasks performed      Strength   Left Hip Flexion 4+/5    Left Hip Extension 4+/5    Left Hip ABduction 4+/5      Palpation   Palpation comment palpable tenderness and tension over Lt hip flexors and proximal quads                         [facility] Adult PT Treatment/Exercise - 03/10/20 0001      Lumbar Exercises: Stretches   Active Hamstring Stretch Left;3 reps;20 seconds    Quad Stretch Right;3 reps;20  seconds    Quad Stretch Limitations using green strap in prone      Lumbar Exercises: Aerobic   Nustep 8 min, level 2 while assessing patient      Lumbar Exercises: Supine   Ab Set 10 reps;5 seconds    AB Set Limitations with ball squeeze      Knee/Hip Exercises: Standing   Rebounder 3 way weight shift x1 minute each      Knee/Hip Exercises: Supine   Bridges with Diona Foley Squeeze Strengthening;Both;2 sets;10 reps    Straight Leg Raises Strengthening;1 set;10 reps;Left    Straight Leg Raises Limitations pain in the Lt hip flexor      Manual Therapy   Manual Therapy Joint mobilization    Manual therapy comments scour of Lt hip joint with passive ER/IR with overpressure                    PT Short Term Goals - 03/03/20 1105      PT SHORT TERM GOAL #2   Title report a 30% reduction in Lt hip pain with standing and walking    Baseline 70%    Status Achieved      PT SHORT TERM GOAL #3   Title reduce Lt hip pain to walk for 3-4 miles for exercise without  limitation due to pain or fatigue    Baseline walking 45 minutes to an hour-2.5 miles    Time 4    Period Weeks    Status On-going             PT Long Term Goals - 03/10/20 1120      PT LONG TERM GOAL #1   Title independent with advanced HEP    Time 8    Period Weeks    Status On-going      PT LONG TERM GOAL #2   Title reduce FOTO to < = to 23% limitation    Baseline 15% limitation    Status Achieved      PT LONG TERM GOAL #3   Title report a 70% reduction in Lt hip pain with standing and walking    Baseline 70% better- good and bad days    Time 8    Period Weeks    Status On-going      PT LONG TERM GOAL #4   Title walk 5 miles for exercise to without limitation due to Lt hip pain or fatigue    Baseline 3 miles with up to 7/10 Lt anterior hip pain, no fatigue    Time 8    Period Weeks    Status On-going      PT LONG TERM GOAL #5   Title demonstrate symmetry with gait on level surface    Status Achieved                 Plan - 03/10/20 1140    Clinical Impression Statement Pt reports 70% overall improvement in symptoms since the start of care.  FOTO is improved to 15% limitation.  Pt is able to walk 3 miles and experiences 7/10 anterior Lt hip pain without fatigue that limits this.  Pt with improved Lt hip strength overall and demonstrates symmetry with gait on level surfaces.  Pt will see MD tomorrow to discuss progress.    PT Frequency 2x / week    PT Duration 8 weeks    PT Treatment/Interventions Biofeedback;Therapeutic activities;Therapeutic exercise;Neuromuscular re-education;Manual techniques;Patient/family education;Dry needling;ADLs/Self Care Home Management;Ultrasound;Moist Heat;Electrical Stimulation;Passive range of  motion;Joint Manipulations    PT Next Visit Plan see what MD says    PT Home Exercise Plan Access Code: [email or web address]    Consulted and Agree with Plan of Care Patient           Patient will benefit from skilled therapeutic intervention  in order to improve the following deficits and impairments:  Decreased range of motion, Increased fascial restricitons, Increased muscle spasms, Decreased activity tolerance, Decreased strength, Abnormal gait, Pain, Difficulty walking, Decreased endurance  Visit Diagnosis: Cramp and spasm  Pain in left hip  Muscle weakness (generalized)  Other abnormalities of gait and mobility     Problem List Patient Active Problem List   Diagnosis Date Noted  . Primary osteoarthritis of left hip 11/26/2019  . 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 Rectal pain   . Dizziness 10/25/2018  . Sinus bradycardia 10/25/2018  . Atypical chest pain 10/25/2018  . Dyslipidemia 10/25/2018  . Chronic right shoulder pain 07/04/2018  . Unilateral primary osteoarthritis, left knee 06/14/2018  . Essential hypertension 04/28/2018  . Hypertensive disorder 04/18/2018  . Cholecystitis with cholelithiasis 02/11/2018  . Multiple pulmonary nodules determined by computed tomography of lung 01/26/2018  . Upper airway cough syndrome 01/25/2018     Sigurd Sos, PT 03/10/20 11:46 AM  [facility] [address], [place], [place], [address] Phone: [phone]   Fax:  [phone]  Name: Nancy Christensen MRN: [id number] Date of Birth: 03/01/1951

## 2020-03-11 ENCOUNTER — Ambulatory Visit (INDEPENDENT_AMBULATORY_CARE_PROVIDER_SITE_OTHER): Payer: Medicare Other | Admitting: Orthopaedic Surgery

## 2020-03-11 ENCOUNTER — Ambulatory Visit: Payer: Self-pay

## 2020-03-11 ENCOUNTER — Encounter: Payer: Self-pay | Admitting: Orthopaedic Surgery

## 2020-03-11 ENCOUNTER — Telehealth: Payer: Self-pay

## 2020-03-11 DIAGNOSIS — M1612 Unilateral primary osteoarthritis, left hip: Secondary | ICD-10-CM

## 2020-03-11 DIAGNOSIS — M1712 Unilateral primary osteoarthritis, left knee: Secondary | ICD-10-CM

## 2020-03-11 HISTORY — DX: Unilateral primary osteoarthritis, left knee: M17.12

## 2020-03-11 MED ORDER — BUPIVACAINE HCL 0.5 % IJ SOLN
2.0000 mL | INTRAMUSCULAR | Status: AC | PRN
  Administered 2020-03-11: 2 mL via INTRA_ARTICULAR

## 2020-03-11 MED ORDER — LIDOCAINE HCL 1 % IJ SOLN
2.0000 mL | INTRAMUSCULAR | Status: AC | PRN
  Administered 2020-03-11: 2 mL

## 2020-03-11 MED ORDER — METHYLPREDNISOLONE ACETATE 40 MG/ML IJ SUSP
40.0000 mg | INTRAMUSCULAR | Status: AC | PRN
  Administered 2020-03-11: 40 mg via INTRA_ARTICULAR

## 2020-03-11 NOTE — Progress Notes (Signed)
Office Visit Note   Patient: Nancy Christensen           Date of Birth: 05-27-1951           MRN: [id number] Visit Date: 03/11/2020              Requested by: Billie Ruddy, MD [place],  [place] [address] PCP: Billie Ruddy, MD   Assessment & Plan: Visit Diagnoses:  1. Primary osteoarthritis of left knee   2. Primary osteoarthritis of left hip     Plan: Impression is left knee and hip DJD.  At this point she feels like the hip pain is still bearable and she is not ready to have a hip replacement.  For the left knee we performed a cortisone injection.  We will also get approval for Visco injection.  Follow-Up Instructions: Return if symptoms worsen or fail to improve.   Orders:  Orders Placed This Encounter  Procedures  . XR KNEE 3 VIEW LEFT   No orders of the defined types were placed in this encounter.     Procedures: Large Joint Inj: L knee on 03/11/2020 8:28 PM Details: 22 G needle Medications: 2 mL bupivacaine 0.5 %; 2 mL lidocaine 1 %; 40 mg methylPREDNISolone acetate 40 MG/ML Outcome: tolerated well, no immediate complications Patient was prepped and draped in the usual sterile fashion.       Clinical Data: No additional findings.   Subjective: Chief Complaint  Patient presents with  . Left Hip - Pain  . Left Knee - Pain    I Nancy Christensen is following up for her left knee DJD as well as left hip DJD.  In terms of the left hip she has been doing some PT.  The previous cortisone injection only helped for about 3 to 4 weeks but it did not give her really good relief.  For the left knee she had some temporary relief from the cortisone injection would like to try another one.  She is also interested in Visco injection.  She has been taking diclofenac.  She is also been using Tylenol heat ice.   Review of Systems  Constitutional: Negative.   HENT: Negative.   Eyes: Negative.   Respiratory: Negative.   Cardiovascular: Negative.     Endocrine: Negative.   Musculoskeletal: Negative.   Neurological: Negative.   Hematological: Negative.   Psychiatric/Behavioral: Negative.   All other systems reviewed and are negative.    Objective: Vital Signs: There were no vitals taken for this visit.  Physical Exam Vitals and nursing note reviewed.  Constitutional:      Appearance: She is well-developed.  Pulmonary:     Effort: Pulmonary effort is normal.  Skin:    General: Skin is warm.     Capillary Refill: Capillary refill takes less than 2 seconds.  Neurological:     Mental Status: She is alert and oriented to person, place, and time.  Psychiatric:        Behavior: Behavior normal.        Thought Content: Thought content normal.        Judgment: Judgment normal.     Ortho Exam Left knee and hip exams are unchanged. Specialty Comments:  No specialty comments available.  Imaging: XR KNEE 3 VIEW LEFT  Result Date: 03/11/2020 Moderately severe DJD    PMFS History: Patient Active Problem List   Diagnosis Date Noted  . Primary osteoarthritis of left knee 03/11/2020  . Primary  osteoarthritis of left hip 11/26/2019  . 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 Rectal pain   . Dizziness 10/25/2018  . Sinus bradycardia 10/25/2018  . Atypical chest pain 10/25/2018  . Dyslipidemia 10/25/2018  . Chronic right shoulder pain 07/04/2018  . Unilateral primary osteoarthritis, left knee 06/14/2018  . Essential hypertension 04/28/2018  . Hypertensive disorder 04/18/2018  . Cholecystitis with cholelithiasis 02/11/2018  . Multiple pulmonary nodules determined by computed tomography of lung 01/26/2018  . Upper airway cough syndrome 01/25/2018   Past Medical History:  Diagnosis Date  . Arthritis    left hip and knee  . Hyperlipidemia   . Hypertension     Family History  Problem Relation Age of Onset  . Diabetes Mother   . Hypertension Mother   . Thyroid disease Mother   . Vascular Disease Mother   . Lung  cancer Father   . Colon cancer Maternal Uncle   . Colon polyps Neg Hx   . Esophageal cancer Neg Hx   . Stomach cancer Neg Hx   . Rectal cancer Neg Hx   . Breast cancer Neg Hx     Past Surgical History:  Procedure Laterality Date  . ABDOMINAL HYSTERECTOMY  1997  . ANAL RECTAL MANOMETRY N/A 08/09/2019   Procedure: ANO RECTAL MANOMETRY;  Surgeon: Thornton Park, MD;  Location: [facility];  Service: Gastroenterology;  Laterality: N/A;  . BREAST SURGERY  2002   left cyst removal   . CHOLECYSTECTOMY N/A 02/12/2018   Procedure: LAPAROSCOPIC CHOLECYSTECTOMY;  Surgeon: Leighton Ruff, MD;  Location: [facility];  Service: General;  Laterality: N/A;  . COLONOSCOPY    . POLYPECTOMY     Social History   Occupational History  . Not on file  Tobacco Use  . Smoking status: Never Smoker  . Smokeless tobacco: Never Used  Vaping Use  . Vaping Use: Never used  Substance and Sexual Activity  . Alcohol use: Never  . Drug use: Not Currently  . Sexual activity: Not on file

## 2020-03-11 NOTE — Telephone Encounter (Signed)
Please submit for Left knee gel inj-Dr. Erlinda Hong.

## 2020-03-12 NOTE — Telephone Encounter (Signed)
Noted  

## 2020-03-16 ENCOUNTER — Telehealth: Payer: Self-pay

## 2020-03-16 NOTE — Telephone Encounter (Signed)
Submitted VOB for SynviscOne, left knee. 

## 2020-03-18 ENCOUNTER — Ambulatory Visit: Payer: Medicare Other

## 2020-03-19 ENCOUNTER — Other Ambulatory Visit: Payer: Self-pay

## 2020-03-19 ENCOUNTER — Ambulatory Visit: Payer: Medicare Other

## 2020-03-19 DIAGNOSIS — R252 Cramp and spasm: Secondary | ICD-10-CM | POA: Diagnosis not present

## 2020-03-19 DIAGNOSIS — R2689 Other abnormalities of gait and mobility: Secondary | ICD-10-CM

## 2020-03-19 DIAGNOSIS — M25552 Pain in left hip: Secondary | ICD-10-CM

## 2020-03-19 DIAGNOSIS — M6281 Muscle weakness (generalized): Secondary | ICD-10-CM

## 2020-03-19 NOTE — Therapy (Signed)
[facility] [address], [place], [place], [address] Phone: [phone]   Fax:  [phone]  Physical Therapy Treatment  Patient Details  Name: Nancy Christensen MRN: [id number] Date of Birth: December 25, 1950 Referring Provider (PT): Eduard Roux, MD   Encounter Date: 03/19/2020   PT End of Session - 03/19/20 0810    Visit Number 10    Authorization Time Period KX on visit #7 (8 visits earlier this year for pelvic floor)    PT Start Time 0732    PT Stop Time 0810    PT Time Calculation (min) 38 min    Activity Tolerance Patient tolerated treatment well    Behavior During Therapy [facility] for tasks assessed/performed           Past Medical History:  Diagnosis Date  . Arthritis    left hip and knee  . Hyperlipidemia   . Hypertension     Past Surgical History:  Procedure Laterality Date  . ABDOMINAL HYSTERECTOMY  1997  . ANAL RECTAL MANOMETRY N/A 08/09/2019   Procedure: ANO RECTAL MANOMETRY;  Surgeon: Thornton Park, MD;  Location: [facility];  Service: Gastroenterology;  Laterality: N/A;  . BREAST SURGERY  2002   left cyst removal   . CHOLECYSTECTOMY N/A 02/12/2018   Procedure: LAPAROSCOPIC CHOLECYSTECTOMY;  Surgeon: Leighton Ruff, MD;  Location: [facility];  Service: General;  Laterality: N/A;  . COLONOSCOPY    . POLYPECTOMY      There were no vitals filed for this visit.   Subjective Assessment - 03/19/20 0736    Subjective I saw Dr Erlinda Hong and he did an injection into my knee.  They are going to get approval for gel injections into my Lt hip.    Currently in Pain? Yes    Pain Score 0-No pain   up to 5/10 with walking longer distances   Pain Location Hip    Pain Orientation Left    Pain Descriptors / Indicators Tightness    Pain Onset More than a month ago    Pain Frequency Intermittent    Aggravating Factors  walking longer distances, stepping up, hip flexion    Pain Relieving Factors stretching, topical  rub, Tylenol              [facility] PT Assessment - 03/19/20 0001      Assessment   Medical Diagnosis Primary OA of the Lt hip    Referring Provider (PT) Eduard Roux, MD    Onset Date/Surgical Date 08/23/19      Cognition   Overall Cognitive Status Within Functional Limits for tasks assessed      Observation/Other Assessments   Focus on Therapeutic Outcomes (FOTO)  15% limitation      Strength   Left Hip Flexion 4+/5    Left Hip Extension 4+/5    Left Hip ABduction 4+/5      Palpation   Palpation comment palpable tenderness and tension over Lt hip flexors and proximal quads- this is improved                         [facility] Adult PT Treatment/Exercise - 03/19/20 0001      Lumbar Exercises: Stretches   Quad Stretch Right;3 reps;20 seconds    Quad Stretch Limitations using green strap in prone      Lumbar Exercises: Aerobic   Nustep 8 min, level 2 while assessing patient      Lumbar Exercises: Supine  Ab Set 10 reps;5 seconds    AB Set Limitations with ball squeeze      Knee/Hip Exercises: Standing   Lateral Step Up Left;2 sets;10 reps    Forward Step Up Both;2 sets;10 reps      Manual Therapy   Manual Therapy Joint mobilization;Soft tissue mobilization;Myofascial release    Joint Mobilization addaday to Lt hip flexors and lateral quad                    PT Short Term Goals - 03/03/20 1105      PT SHORT TERM GOAL #2   Title report a 30% reduction in Lt hip pain with standing and walking    Baseline 70%    Status Achieved      PT SHORT TERM GOAL #3   Title reduce Lt hip pain to walk for 3-4 miles for exercise without limitation due to pain or fatigue    Baseline walking 45 minutes to an hour-2.5 miles    Time 4    Period Weeks    Status On-going             PT Long Term Goals - 03/19/20 0739      PT LONG TERM GOAL #1   Title independent with advanced HEP    Status Achieved      PT LONG TERM GOAL #2   Title reduce FOTO to <  = to 23% limitation    Baseline 15% limitation    Status Achieved      PT LONG TERM GOAL #3   Title report a 70% reduction in Lt hip pain with standing and walking    Baseline 70% better- good and bad days    Status Achieved      PT LONG TERM GOAL #4   Title walk 5 miles for exercise to without limitation due to Lt hip pain or fatigue    Baseline 3 miles with up to 5/10 Lt anterior hip pain, no fatigue    Status Partially Met      PT LONG TERM GOAL #5   Title demonstrate symmetry with gait on level surface    Status Achieved                 Plan - 03/19/20 0753    Clinical Impression Statement Pt reports 70% overall improvement in Lt hip symptoms since the start of care.  Pt reports a max of Lt hip pain of 5/10 and is able to walk for 4 miles for exercise now.  FOTO is improved to 15% limitation and strength is overall improved in the Lt hip musculature.  Pt has HEP in place for continued strength and flexibility gains.  PT added additional exercises to address Lt hip and knee stability.  Pt will D/C today to HEP and follow-up with MD as needed.    PT Next Visit Plan D/C PT to HEP today.    PT Home Exercise Plan Access Code: [email or web address]    Consulted and Agree with Plan of Care Patient           Patient will benefit from skilled therapeutic intervention in order to improve the following deficits and impairments:     Visit Diagnosis: Cramp and spasm  Pain in left hip  Muscle weakness (generalized)  Other abnormalities of gait and mobility     Problem List Patient Active Problem List   Diagnosis Date Noted  . Primary osteoarthritis of left knee 03/11/2020  .  Primary osteoarthritis of left hip 11/26/2019  . 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 Rectal pain   . Dizziness 10/25/2018  . Sinus bradycardia 10/25/2018  . Atypical chest pain 10/25/2018  . Dyslipidemia 10/25/2018  . Chronic right shoulder pain 07/04/2018  . Unilateral primary osteoarthritis, left  knee 06/14/2018  . Essential hypertension 04/28/2018  . Hypertensive disorder 04/18/2018  . Cholecystitis with cholelithiasis 02/11/2018  . Multiple pulmonary nodules determined by computed tomography of lung 01/26/2018  . Upper airway cough syndrome 01/25/2018   PHYSICAL THERAPY DISCHARGE SUMMARY  Visits from Start of Care: 10  Current functional level related to goals / functional outcomes: See above for current status.     Remaining deficits: Lt hip pain up to 5/10 with long periods of walking.  Pt has HEP in place.     Education / Equipment: HEP Plan: Patient agrees to discharge.  Patient goals were met. Patient is being discharged due to meeting the stated rehab goals.  ?????          Sigurd Sos, PT 03/19/20 8:12 AM  [facility] [address] [place], [place], [address] Phone: [phone]   Fax:  [phone]  Name: Nancy Christensen MRN: [id number] Date of Birth: 1951-01-04

## 2020-04-01 ENCOUNTER — Telehealth: Payer: Self-pay

## 2020-04-01 NOTE — Telephone Encounter (Signed)
Patient is aware that she is approved for gel injection.  Approved, SynviscOne, left knee. [place] Patient will be responsible for 20% OOP. No Co-pay No PA required

## 2020-04-03 ENCOUNTER — Encounter: Payer: Self-pay | Admitting: Family Medicine

## 2020-04-03 ENCOUNTER — Ambulatory Visit (INDEPENDENT_AMBULATORY_CARE_PROVIDER_SITE_OTHER): Payer: Medicare Other | Admitting: Family Medicine

## 2020-04-03 ENCOUNTER — Other Ambulatory Visit: Payer: Self-pay

## 2020-04-03 VITALS — BP 110/80 | HR 69 | Temp 97.5°F | Ht 64.0 in | Wt 206.0 lb

## 2020-04-03 DIAGNOSIS — R634 Abnormal weight loss: Secondary | ICD-10-CM

## 2020-04-03 DIAGNOSIS — Z13220 Encounter for screening for lipoid disorders: Secondary | ICD-10-CM

## 2020-04-03 DIAGNOSIS — Z131 Encounter for screening for diabetes mellitus: Secondary | ICD-10-CM

## 2020-04-03 DIAGNOSIS — I10 Essential (primary) hypertension: Secondary | ICD-10-CM

## 2020-04-03 LAB — POCT GLYCOSYLATED HEMOGLOBIN (HGB A1C): Hemoglobin A1C: 5.7 % — AB (ref 4.0–5.6)

## 2020-04-03 LAB — LIPID PANEL
Cholesterol: 212 mg/dL — ABNORMAL HIGH (ref <200)
HDL: 55 mg/dL (ref >=50)
LDL Cholesterol (Calc): 140 mg/dL (calc) — ABNORMAL HIGH
Non-HDL Cholesterol (Calc): 157 mg/dL (calc) — ABNORMAL HIGH (ref <130)
Total CHOL/HDL Ratio: 3.9 (calc) (ref <5.0)
Triglycerides: 71 mg/dL (ref <150)

## 2020-04-03 NOTE — Patient Instructions (Signed)
   Managing Your Hypertension Hypertension is commonly called high blood pressure. This is when the force of your blood pressing against the walls of your arteries is too strong. Arteries are blood vessels that carry blood from your heart throughout your body. Hypertension forces the heart to work harder to pump blood, and may cause the arteries to become narrow or stiff. Having untreated or uncontrolled hypertension can cause heart attack, stroke, kidney disease, and other problems. What are blood pressure readings? A blood pressure reading consists of a higher number over a lower number. Ideally, your blood pressure should be below 120/80. The first ("top") number is called the systolic pressure. It is a measure of the pressure in your arteries as your heart beats. The second ("bottom") number is called the diastolic pressure. It is a measure of the pressure in your arteries as the heart relaxes. What does my blood pressure reading mean? Blood pressure is classified into four stages. Based on your blood pressure reading, your health care provider may use the following stages to determine what type of treatment you need, if any. Systolic pressure and diastolic pressure are measured in a unit called mm Hg. Normal  Systolic pressure: below 120.  Diastolic pressure: below 80. Elevated  Systolic pressure: 120-129.  Diastolic pressure: below 80. Hypertension stage 1  Systolic pressure: 130-139.  Diastolic pressure: 80-89. Hypertension stage 2  Systolic pressure: 140 or above.  Diastolic pressure: 90 or above. What health risks are associated with hypertension? Managing your hypertension is an important responsibility. Uncontrolled hypertension can lead to:  A heart attack.  A stroke.  A weakened blood vessel (aneurysm).  Heart failure.  Kidney damage.  Eye damage.  Metabolic syndrome.  Memory and concentration problems. What changes can I make to manage my  hypertension? Hypertension can be managed by making lifestyle changes and possibly by taking medicines. Your health care provider will help you make a plan to bring your blood pressure within a normal range. Eating and drinking   Eat a diet that is high in fiber and potassium, and low in salt (sodium), added sugar, and fat. An example eating plan is called the DASH (Dietary Approaches to Stop Hypertension) diet. To eat this way: ? Eat plenty of fresh fruits and vegetables. Try to fill half of your plate at each meal with fruits and vegetables. ? Eat whole grains, such as whole wheat pasta, brown rice, or whole grain bread. Fill about one quarter of your plate with whole grains. ? Eat low-fat diary products. ? Avoid fatty cuts of meat, processed or cured meats, and poultry with skin. Fill about one quarter of your plate with lean proteins such as fish, chicken without skin, beans, eggs, and tofu. ? Avoid premade and processed foods. These tend to be higher in sodium, added sugar, and fat.  Reduce your daily sodium intake. Most people with hypertension should eat less than 1,500 mg of sodium a day.  Limit alcohol intake to no more than 1 drink a day for nonpregnant women and 2 drinks a day for men. One drink equals 12 oz of beer, 5 oz of wine, or 1 oz of hard liquor. Lifestyle  Work with your health care provider to maintain a healthy body weight, or to lose weight. Ask what an ideal weight is for you.  Get at least 30 minutes of exercise that causes your heart to beat faster (aerobic exercise) most days of the week. Activities may include walking, swimming, or biking.    Include exercise to strengthen your muscles (resistance exercise), such as weight lifting, as part of your weekly exercise routine. Try to do these types of exercises for 30 minutes at least 3 days a week.  Do not use any products that contain nicotine or tobacco, such as cigarettes and e-cigarettes. If you need help quitting,  ask your health care provider.  Control any long-term (chronic) conditions you have, such as high cholesterol or diabetes. Monitoring  Monitor your blood pressure at home as told by your health care provider. Your personal target blood pressure may vary depending on your medical conditions, your age, and other factors.  Have your blood pressure checked regularly, as often as told by your health care provider. Working with your health care provider  Review all the medicines you take with your health care provider because there may be side effects or interactions.  Talk with your health care provider about your diet, exercise habits, and other lifestyle factors that may be contributing to hypertension.  Visit your health care provider regularly. Your health care provider can help you create and adjust your plan for managing hypertension. Will I need medicine to control my blood pressure? Your health care provider may prescribe medicine if lifestyle changes are not enough to get your blood pressure under control, and if:  Your systolic blood pressure is 130 or higher.  Your diastolic blood pressure is 80 or higher. Take medicines only as told by your health care provider. Follow the directions carefully. Blood pressure medicines must be taken as prescribed. The medicine does not work as well when you skip doses. Skipping doses also puts you at risk for problems. Contact a health care provider if:  You think you are having a reaction to medicines you have taken.  You have repeated (recurrent) headaches.  You feel dizzy.  You have swelling in your ankles.  You have trouble with your vision. Get help right away if:  You develop a severe headache or confusion.  You have unusual weakness or numbness, or you feel faint.  You have severe pain in your chest or abdomen.  You vomit repeatedly.  You have trouble breathing. Summary  Hypertension is when the force of blood pumping  through your arteries is too strong. If this condition is not controlled, it may put you at risk for serious complications.  Your personal target blood pressure may vary depending on your medical conditions, your age, and other factors. For most people, a normal blood pressure is less than 120/80.  Hypertension is managed by lifestyle changes, medicines, or both. Lifestyle changes include weight loss, eating a healthy, low-sodium diet, exercising more, and limiting alcohol. This information is not intended to replace advice given to you by your health care provider. Make sure you discuss any questions you have with your health care provider. Document Revised: 01/04/2019 Document Reviewed: 08/10/2016 Elsevier Patient Education  2020 [employer]vier Inc.  

## 2020-04-08 ENCOUNTER — Telehealth: Payer: Self-pay | Admitting: Family Medicine

## 2020-04-08 NOTE — Telephone Encounter (Signed)
Pt is returning your call and want a call back. 

## 2020-04-08 NOTE — Telephone Encounter (Signed)
Returned call to patient, gave lab results and recommendations. Patient verbalized understanding. 

## 2020-04-10 ENCOUNTER — Encounter: Payer: Self-pay | Admitting: Family Medicine

## 2020-04-10 NOTE — Progress Notes (Signed)
Subjective:    Patient ID: Nancy Christensen, female    DOB: 1950-10-23, 69 y.o.   MRN: [id number]  Chief Complaint  Patient presents with  . Low blood pressure    HPI Patient was seen today for f/u.  Pt notes bp has started to improve over the last few days with out taking norvasc 2.5 mg.  Pt notes she started sticking to the recommended diet and exercise changes which have caused weight loss.  Pt endorses increased energy and overall feeling good.  Past Medical History:  Diagnosis Date  . Arthritis    left hip and knee  . Hyperlipidemia   . Hypertension     No Known Allergies  ROS General: Denies fever, chills, night sweats, changes in weight, changes in appetite HEENT: Denies headaches, ear pain, changes in vision, rhinorrhea, sore throat CV: Denies CP, palpitations, SOB, orthopnea Pulm: Denies SOB, cough, wheezing GI: Denies abdominal pain, nausea, vomiting, diarrhea, constipation GU: Denies dysuria, hematuria, frequency, vaginal discharge Msk: Denies muscle cramps, joint pains Neuro: Denies weakness, numbness, tingling Skin: Denies rashes, bruising Psych: Denies depression, anxiety, hallucinations    Objective:    Blood pressure 110/80, pulse 69, temperature (!) 97.5 F (36.4 C), temperature source Temporal, height 5\' 4"  (1.626 m), weight 206 lb (93.4 kg), SpO2 98 %.  Gen. Pleasant, well-nourished, in no distress, normal affect   HEENT: [place]/AT, face symmetric, no scleral icterus, PERRLA, EOMI, nares patent without drainage Lungs: no accessory muscle use, CTAB, no wheezes or rales Cardiovascular: RRR, no m/r/g, no peripheral edema Musculoskeletal: No deformities, no cyanosis or clubbing, normal tone Neuro:  A&Ox3, CN II-XII intact, normal gait Skin:  Warm, no lesions/ rash  Wt Readings from Last 3 Encounters:  04/03/20 206 lb (93.4 kg)  11/21/19 206 lb 6.4 oz (93.6 kg)  10/15/19 208 lb (94.3 kg)    Lab Results  Component Value Date   WBC 7.5 06/19/2019   HGB  14.2 06/19/2019   HCT 42.2 06/19/2019   PLT 208.0 06/19/2019   GLUCOSE 94 06/19/2019   CHOL 212 (H) 04/03/2020   TRIG 71 04/03/2020   HDL 55 04/03/2020   LDLCALC 140 (H) 04/03/2020   ALT 14 08/22/2018   AST 18 08/22/2018   NA 137 06/19/2019   K 3.8 06/19/2019   CL 100 06/19/2019   CREATININE 1.15 06/19/2019   BUN 13 06/19/2019   CO2 28 06/19/2019   HGBA1C 5.7 (A) 04/03/2020    Assessment/Plan:  Essential hypertension  -Controlled -Continue lifestyle modifications -Okay to hold Norvasc 2.5 mg daily to see how blood pressure response. -Patient encouraged to continue checking BP at home daily.  For BP greater than 140/90 we will start Norvasc 2.5 mg. - Plan: Lipid panel  Screening for cholesterol level  - Plan: Lipid panel  Weight loss -2/2 lifestyle modifications -Patient congratulated on weight loss  Screening for diabetes mellitus (DM)  - Plan: POC HgB A1c  F/u in 4-6 months, sooner if needed.  Grier Mitts, MD

## 2020-04-15 ENCOUNTER — Encounter: Payer: Self-pay | Admitting: Orthopaedic Surgery

## 2020-04-15 ENCOUNTER — Ambulatory Visit (INDEPENDENT_AMBULATORY_CARE_PROVIDER_SITE_OTHER): Payer: Medicare Other | Admitting: Orthopaedic Surgery

## 2020-04-15 DIAGNOSIS — M1712 Unilateral primary osteoarthritis, left knee: Secondary | ICD-10-CM | POA: Diagnosis not present

## 2020-04-15 MED ORDER — HYLAN G-F 20 48 MG/6ML IX SOSY
48.0000 mg | PREFILLED_SYRINGE | INTRA_ARTICULAR | Status: AC | PRN
  Administered 2020-04-15: 48 mg via INTRA_ARTICULAR

## 2020-04-15 NOTE — Progress Notes (Signed)
   Procedure Note  Patient: Nancy Christensen             Date of Birth: [birth year]           MRN: [id number]             Visit Date: 04/15/2020  Procedures: Visit Diagnoses: No diagnosis found.  Large Joint Inj: L knee on 04/15/2020 4:30 PM Indications: pain Details: 22 G needle  Arthrogram: No  Medications: 48 mg Hylan 48 MG/6ML Outcome: tolerated well, no immediate complications Patient was prepped and draped in the usual sterile fashion.

## 2020-05-02 DIAGNOSIS — Z03818 Encounter for observation for suspected exposure to other biological agents ruled out: Secondary | ICD-10-CM | POA: Diagnosis not present

## 2020-05-02 DIAGNOSIS — Z20822 Contact with and (suspected) exposure to covid-19: Secondary | ICD-10-CM | POA: Diagnosis not present

## 2020-05-07 ENCOUNTER — Telehealth: Payer: Self-pay | Admitting: Orthopaedic Surgery

## 2020-05-07 NOTE — Telephone Encounter (Signed)
Pain started yesterday while walking knee gave out. I scheduled patient for Tuesday afternoon. She asked what could be done for pain until her appointment.  I suggested anti-inflammatory if she is able to take it, ice and rest as well as compression if she feels its helpful.  She would like to know if there is anything else you could suggest for her that would be helpful?

## 2020-05-07 NOTE — Telephone Encounter (Signed)
Patient called requesting to be seen by Dr. Erlinda Hong. Patient states her left knee keeps given out and severe pains. Knee is swollen. Patient asked for immediate call back for an appt today 05/07/20. Please call patient back as soon as possible at [phone].

## 2020-05-08 ENCOUNTER — Other Ambulatory Visit: Payer: Self-pay | Admitting: Physician Assistant

## 2020-05-08 MED ORDER — TRAMADOL HCL 50 MG PO TABS: 50.0000 mg | ORAL_TABLET | Freq: Two times a day (BID) | ORAL | 0 refills | Status: DC | PRN

## 2020-05-08 NOTE — Telephone Encounter (Signed)
Could you please let patient know?

## 2020-05-08 NOTE — Telephone Encounter (Signed)
Patient aware.

## 2020-05-08 NOTE — Telephone Encounter (Signed)
I just called in tramadol

## 2020-05-12 ENCOUNTER — Ambulatory Visit (INDEPENDENT_AMBULATORY_CARE_PROVIDER_SITE_OTHER): Payer: Medicare Other | Admitting: Orthopaedic Surgery

## 2020-05-12 ENCOUNTER — Ambulatory Visit (INDEPENDENT_AMBULATORY_CARE_PROVIDER_SITE_OTHER): Payer: Medicare Other

## 2020-05-12 ENCOUNTER — Encounter: Payer: Self-pay | Admitting: Orthopaedic Surgery

## 2020-05-12 VITALS — Ht 64.0 in | Wt 210.0 lb

## 2020-05-12 DIAGNOSIS — M1712 Unilateral primary osteoarthritis, left knee: Secondary | ICD-10-CM

## 2020-05-12 NOTE — Progress Notes (Signed)
Office Visit Note   Patient: Nancy Christensen           Date of Birth: 1951-07-06           MRN: [id number] Visit Date: 05/12/2020              Requested by: Billie Ruddy, MD [place],  [place] [address] PCP: Billie Ruddy, MD   Assessment & Plan: Visit Diagnoses:  1. Unilateral primary osteoarthritis, left knee     Plan: Impression is left knee arthritis flareup and possible degenerative medial meniscus tear.  The patient will continue to increase activity as tolerated.  She will ice and elevate for pain and swelling.  She will follow-up with [place] if her symptoms worsen.  Follow-Up Instructions: Return if symptoms worsen or fail to improve.   Orders:  Orders Placed This Encounter  Procedures  . XR Knee 1-2 Views Left   No orders of the defined types were placed in this encounter.     Procedures: No procedures performed   Clinical Data: No additional findings.   Subjective: Chief Complaint  Patient presents with  . Left Knee - Pain, Follow-up    HPI patient is a very pleasant 69-year-old female who comes in today with a new injury to her left knee.  She does have a history of advanced degenerative joint disease of the left knee.  She has been injected with cortisone as well as viscosupplementation in the past.  About 4 weeks ago she did undergo a viscosupplementation injection which was significantly helping until about a week ago.  She stood up and took a few steps when her left knee buckled and she had severe pain to the medial aspect that radiated halfway down her lower leg.  This did subside after about 48 hours of taking ibuprofen.  She has minimal pain at this point.  She comes in today for evaluation and treatment recommendation.  Review of Systems as detailed in HPI.  All others reviewed and are negative.   Objective: Vital Signs: Ht 5\' 4"  (1.626 m)   Wt 210 lb (95.3 kg)   BMI 36.05 kg/m   Physical Exam well-developed  well-nourished female no acute distress.  Alert and oriented x3.  Ortho Exam examination of her left knee shows a trace effusion.  Range of motion 0 to 100 degrees.  Marked tenderness medial joint line.  Moderate L femoral crepitus.  She is neurovascular intact distally.  Specialty Comments:  No specialty comments available.  Imaging: XR Knee 1-2 Views Left  Result Date: 05/12/2020 Moderate medial and patellofemoral compartment degenerative changes    PMFS History: Patient Active Problem List   Diagnosis Date Noted  . Primary osteoarthritis of left knee 03/11/2020  . Primary osteoarthritis of left hip 11/26/2019  . 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 Rectal pain   . Dizziness 10/25/2018  . Sinus bradycardia 10/25/2018  . Atypical chest pain 10/25/2018  . Dyslipidemia 10/25/2018  . Chronic right shoulder pain 07/04/2018  . Unilateral primary osteoarthritis, left knee 06/14/2018  . Essential hypertension 04/28/2018  . Hypertensive disorder 04/18/2018  . Cholecystitis with cholelithiasis 02/11/2018  . Multiple pulmonary nodules determined by computed tomography of lung 01/26/2018  . Upper airway cough syndrome 01/25/2018   Past Medical History:  Diagnosis Date  . Arthritis    left hip and knee  . Hyperlipidemia   . Hypertension     Family History  Problem Relation Age of Onset  .  Diabetes Mother   . Hypertension Mother   . Thyroid disease Mother   . Vascular Disease Mother   . Lung cancer Father   . Colon cancer Maternal Uncle   . Colon polyps Neg Hx   . Esophageal cancer Neg Hx   . Stomach cancer Neg Hx   . Rectal cancer Neg Hx   . Breast cancer Neg Hx     Past Surgical History:  Procedure Laterality Date  . ABDOMINAL HYSTERECTOMY  1997  . ANAL RECTAL MANOMETRY N/A 08/09/2019   Procedure: ANO RECTAL MANOMETRY;  Surgeon: Thornton Park, MD;  Location: [facility];  Service: Gastroenterology;  Laterality: N/A;  . BREAST SURGERY  2002   left cyst  removal   . CHOLECYSTECTOMY N/A 02/12/2018   Procedure: LAPAROSCOPIC CHOLECYSTECTOMY;  Surgeon: Leighton Ruff, MD;  Location: [facility];  Service: General;  Laterality: N/A;  . COLONOSCOPY    . POLYPECTOMY     Social History   Occupational History  . Not on file  Tobacco Use  . Smoking status: Never Smoker  . Smokeless tobacco: Never Used  Vaping Use  . Vaping Use: Never used  Substance and Sexual Activity  . Alcohol use: Never  . Drug use: Not Currently  . Sexual activity: Not on file

## 2020-05-18 ENCOUNTER — Other Ambulatory Visit: Payer: Self-pay

## 2020-05-18 ENCOUNTER — Ambulatory Visit (INDEPENDENT_AMBULATORY_CARE_PROVIDER_SITE_OTHER): Payer: Medicare Other | Admitting: Podiatrist

## 2020-05-18 DIAGNOSIS — M7989 Other specified soft tissue disorders: Secondary | ICD-10-CM | POA: Diagnosis not present

## 2020-05-18 DIAGNOSIS — L600 Ingrowing nail: Secondary | ICD-10-CM | POA: Diagnosis not present

## 2020-05-18 NOTE — Progress Notes (Signed)
    Chief Complaint  Patient presents with  . Nail Problem    R hallux, lateral border. Pt stated, "An ingrown nail was removed a year ago. I've had some itching and occ. shooting pain again for the past 1.5 months. No drainage. I clean it with peroxide and I occ. soak in Epsom salt".     HPI: Patient is 69 y.o. female who presents today for follow up of ingrown toenail removal on the  Right hallux lateral border that was removed a year ago-  She has itching and occasional pain for the past 1.5 months.  She also has a soft tissue lesion on the left foot below the 3rd toe she would like looked at today.   No Known Allergies  Review of systems is reviewed and negative.   Physical Exam  Patient is awake, alert, and oriented x 3.  In no acute distress.    Vascular status is intact with palpable pedal pulses DP and PT bilateral and capillary refill time less than 3 seconds bilateral.  No edema or erythema noted.  Neurological exam reveals epicritic and protective sensation grossly intact bilateral.  Dermatological exam reveals skin is supple and dry to bilateral feet.  No open lesions present.  Right hallux lateral nail border appears to be healing well.  There is a small darker line down the border itself where the nail appears to have separated from the nail bed post nail removal.  No redness, swelling, or sign of infection is present.   Left foot at the 3rd MPJ plantarly there is a palpable soft tissue mass beneath the skin surface.  It appears smooth and round and measures 1.5 cm in diameter.  No breakdown of skin noted. Mass in non tender.   Musculoskeletal exam: Musculature intact with dorsiflexion, plantarflexion, inversion, eversion. Ankle and First MPJ joint range of motion normal.     Assessment:   ICD-10-CM   1. Ingrown nail  L60.0   2. Mass of soft tissue of foot  M79.89      Plan: Discussed that the nail appears to be healing normally and the dark area is common after  this type of procedure.  I recommended applying vaseline to the side of the nail to soften the nail fold and allow for better adherence of the nail growing forward.  I also examined the soft tissue mass and recommended she keep a close eye on this lesion.  I recommended her seeing one of our surgeons for removal it it gets any larger or becomes painful.

## 2020-05-20 ENCOUNTER — Encounter: Payer: Self-pay | Admitting: Podiatrist

## 2020-06-13 DIAGNOSIS — Z20822 Contact with and (suspected) exposure to covid-19: Secondary | ICD-10-CM | POA: Diagnosis not present

## 2020-06-19 ENCOUNTER — Ambulatory Visit (INDEPENDENT_AMBULATORY_CARE_PROVIDER_SITE_OTHER): Payer: Medicare Other

## 2020-06-19 ENCOUNTER — Other Ambulatory Visit: Payer: Self-pay

## 2020-06-19 DIAGNOSIS — Z1231 Encounter for screening mammogram for malignant neoplasm of breast: Secondary | ICD-10-CM

## 2020-06-19 DIAGNOSIS — Z0000 Encounter for general adult medical examination without abnormal findings: Secondary | ICD-10-CM

## 2020-06-19 DIAGNOSIS — Z780 Asymptomatic menopausal state: Secondary | ICD-10-CM

## 2020-06-19 NOTE — Progress Notes (Signed)
Subjective:   Nancy Christensen is a 69 y.o. female who presents for Medicare Annual (Subsequent) preventive examination.  I connected with Nancy Christensen today by telephone and verified that I am speaking with the correct person using two identifiers. Location patient: home Location provider: work Persons participating in the virtual visit: patient, provider.   I discussed the limitations, risks, security and privacy concerns of performing an evaluation and management service by telephone and the availability of in person appointments. I also discussed with the patient that there may be a patient responsible charge related to this service. The patient expressed understanding and verbally consented to this telephonic visit.    Interactive audio and video telecommunications were attempted between this provider and patient, however failed, due to patient having technical difficulties OR patient did not have access to video capability.  We continued and completed visit with audio only.     Review of Systems    N/A  Cardiac Risk Factors include: advanced age (>44men, >36 women);dyslipidemia     Objective:    Today's Vitals   There is no height or weight on file to calculate BMI.  Advanced Directives 06/19/2020 01/21/2020 10/22/2019 09/02/2018 02/12/2018 02/11/2018 02/11/2018  Does Patient Have a Medical Advance Directive? Yes Yes No;Yes Yes Yes Yes Yes  Type of Paramedic of [place];Living will [place];Living will [place];Living will [place];Living will [employer];Living will Healthcare Power of [place]  Does patient want to make changes to medical advance directive? No - Patient declined - No - Patient declined - No - Patient declined No - Patient declined -  Copy of [place] in Chart? No - copy requested No - copy requested No - copy  requested - No - copy requested - -    Current Medications (verified) Outpatient Encounter Medications as of 06/19/2020  Medication Sig  . BILE SALTS-CAPS-CASC-PHENOLPTH PO Take by mouth 3 (three) times daily. Once after each meal   . cholecalciferol (VITAMIN D3) 25 MCG (1000 UT) tablet Take 1,000 Units by mouth daily.  . diclofenac sodium (VOLTAREN) 1 % GEL APPLY 2 G TOPICALLY 4 (FOUR) TIMES DAILY AS NEEDED FOR KNEE PAIN  . rosuvastatin (CRESTOR) 10 MG tablet Take 1 tablet (10 mg total) by mouth daily.  . vitamin B-12 (CYANOCOBALAMIN) 1000 MCG tablet Take 1,000 mcg by mouth daily.  . vitamin C (ASCORBIC ACID) 500 MG tablet Take 500 mg by mouth daily.  . [DISCONTINUED] amLODipine (NORVASC) 2.5 MG tablet Take 1 tablet (2.5 mg total) by mouth daily. (Patient not taking: Reported on 05/18/2020)  . [DISCONTINUED] traMADol (ULTRAM) 50 MG tablet Take 1-2 tablets (50-100 mg total) by mouth 2 (two) times daily as needed.   No facility-administered encounter medications on file as of 06/19/2020.    Allergies (verified) Patient has no known allergies.   History: Past Medical History:  Diagnosis Date  . Arthritis    left hip and knee  . Hyperlipidemia   . Hypertension    Past Surgical History:  Procedure Laterality Date  . ABDOMINAL HYSTERECTOMY  1997  . ANAL RECTAL MANOMETRY N/A 08/09/2019   Procedure: ANO RECTAL MANOMETRY;  Surgeon: Thornton Park, MD;  Location: [facility];  Service: Gastroenterology;  Laterality: N/A;  . BREAST SURGERY  2002   left cyst removal   . CHOLECYSTECTOMY N/A 02/12/2018   Procedure: LAPAROSCOPIC CHOLECYSTECTOMY;  Surgeon: Leighton Ruff, MD;  Location: [facility];  Service: General;  Laterality: N/A;  . COLONOSCOPY    . POLYPECTOMY     Family History  Problem Relation Age of Onset  . Diabetes Mother   . Hypertension Mother   . Thyroid disease Mother   . Vascular Disease Mother   . Lung cancer Father   . Colon cancer Maternal Uncle   . Colon polyps Neg  Hx   . Esophageal cancer Neg Hx   . Stomach cancer Neg Hx   . Rectal cancer Neg Hx   . Breast cancer Neg Hx    Social History   Socioeconomic History  . Marital status: Divorced    Spouse name: Not on file  . Number of children: Not on file  . Years of education: Not on file  . Highest education level: Not on file  Occupational History  . Not on file  Tobacco Use  . Smoking status: Never Smoker  . Smokeless tobacco: Never Used  Vaping Use  . Vaping Use: Never used  Substance and Sexual Activity  . Alcohol use: Never  . Drug use: Not Currently  . Sexual activity: Not on file  Other Topics Concern  . Not on file  Social History Narrative  . Not on file   Social Determinants of Health   Financial Resource Strain: Low Risk   . Difficulty of Paying Living Expenses: Not hard at all  Food Insecurity: No Food Insecurity  . Worried About Charity fundraiser in the Last Year: Never true  . Ran Out of Food in the Last Year: Never true  Transportation Needs: No Transportation Needs  . Lack of Transportation (Medical): No  . Lack of Transportation (Non-Medical): No  Physical Activity: Sufficiently Active  . Days of Exercise per Week: 7 days  . Minutes of Exercise per Session: 30 min  Stress: No Stress Concern Present  . Feeling of Stress : Not at all  Social Connections: Moderately Integrated  . Frequency of Communication with Friends and Family: More than three times a week  . Frequency of Social Gatherings with Friends and Family: Once a week  . Attends Religious Services: More than 4 times per year  . Active Member of Clubs or Organizations: Yes  . Attends Archivist Meetings: More than 4 times per year  . Marital Status: Divorced    Tobacco Counseling Counseling given: Not Answered   Clinical Intake:  Pre-visit preparation completed: Yes  Pain : No/denies pain     Nutritional Risks: None Diabetes: No  How often do you need to have someone help you  when you read instructions, pamphlets, or other written materials from your doctor or pharmacy?: 1 - Never What is the last grade level you completed in school?: Post Graduate  Diabetic?No  Interpreter Needed?: No  Information entered by :: [place] of Daily Living In your present state of health, do you have any difficulty performing the following activities: 06/19/2020  Hearing? N  Vision? N  Difficulty concentrating or making decisions? N  Walking or climbing stairs? N  Dressing or bathing? N  Doing errands, shopping? N  Preparing Food and eating ? N  Using the Toilet? N  In the past six months, have you accidently leaked urine? N  Do you have problems with loss of bowel control? N  Managing your Medications? N  Managing your Finances? N  Housekeeping or managing your Housekeeping? N  Some recent data might be hidden    Patient Care  Team: Billie Ruddy, MD as PCP - General (Family Medicine)  Indicate any recent Medical Services you may have received from other than [facility] providers in the past year (date may be approximate).     Assessment:   This is a routine wellness examination for Nancy Christensen.  Hearing/Vision screen  Hearing Screening   125Hz  250Hz  500Hz  1000Hz  2000Hz  3000Hz  4000Hz  6000Hz  8000Hz   Right ear:           Left ear:           Vision Screening Comments: Patient states gets eyes checked annually   Dietary issues and exercise activities discussed: Current Exercise Habits: Home exercise routine, Type of exercise: walking, Time (Minutes): 30, Frequency (Times/Week): 7, Weekly Exercise (Minutes/Week): 210, Intensity: Mild, Exercise limited by: None identified  Goals    . Patient Stated     I will continue walk 2-3 miles per day, and every other day do cardio workouts      Depression Screen PHQ 2/9 Scores 06/19/2020 06/19/2019 01/03/2018  PHQ - 2 Score 0 0 0  PHQ- 9 Score 0 - -    Fall Risk Fall Risk  06/19/2020 06/19/2019 01/03/2018  Falls  in the past year? 0 0 No  Number falls in past yr: 0 - -  Injury with Fall? 0 - -  Follow up Falls evaluation completed;Falls prevention discussed - -    Any stairs in or around the home? Yes  If so, are there any without handrails? No  Home free of loose throw rugs in walkways, pet beds, electrical cords, etc? Yes  Adequate lighting in your home to reduce risk of falls? Yes   ASSISTIVE DEVICES UTILIZED TO PREVENT FALLS:  Life alert? No  Use of a cane, walker or w/c? No  Grab bars in the bathroom? No  Shower chair or bench in shower? No  Elevated toilet seat or a handicapped toilet? No   Cognitive Function:  Cognitive screening not indicated based on direct observation.      Immunizations  There is no immunization history on file for this patient.  TDAP status: Up to date Flu Vaccine status: Declined, Education has been provided regarding the importance of this vaccine but patient still declined. Advised may receive this vaccine at local pharmacy or Health Dept. Aware to provide a copy of the vaccination record if obtained from local pharmacy or Health Dept. Verbalized acceptance and understanding. Pneumococcal vaccine status: Declined,  Education has been provided regarding the importance of this vaccine but patient still declined. Advised may receive this vaccine at local pharmacy or Health Dept. Aware to provide a copy of the vaccination record if obtained from local pharmacy or Health Dept. Verbalized acceptance and understanding.  Covid-19 vaccine status: Declined, Education has been provided regarding the importance of this vaccine but patient still declined. Advised may receive this vaccine at local pharmacy or Health Dept.or vaccine clinic. Aware to provide a copy of the vaccination record if obtained from local pharmacy or Health Dept. Verbalized acceptance and understanding.  Qualifies for Shingles Vaccine? No   Zostavax completed No   Shingrix Completed?: No.     Education has been provided regarding the importance of this vaccine. Patient has been advised to call insurance company to determine out of pocket expense if they have not yet received this vaccine. Advised may also receive vaccine at local pharmacy or Health Dept. Verbalized acceptance and understanding.  Screening Tests Health Maintenance  Topic Date Due  . Hepatitis C Screening  Never done  . COVID-19 Vaccine (1) Never done  . TETANUS/TDAP  Never done  . DEXA SCAN  Never done  . PNA vac Low Risk Adult (1 of 2 - PCV13) Never done  . INFLUENZA VACCINE  Never done  . MAMMOGRAM  07/07/2021  . COLONOSCOPY  07/11/2022    Health Maintenance  Health Maintenance Due  Topic Date Due  . Hepatitis C Screening  Never done  . COVID-19 Vaccine (1) Never done  . TETANUS/TDAP  Never done  . DEXA SCAN  Never done  . PNA vac Low Risk Adult (1 of 2 - PCV13) Never done  . INFLUENZA VACCINE  Never done    Colorectal cancer screening: Completed 07/12/2019. Repeat every 3 years Mammogram status: Completed 07/08/2019. Repeat every year Bone Density status: Ordered 06/19/2020. Pt provided with contact info and advised to call to schedule appt.  Lung Cancer Screening: (Low Dose CT Chest recommended if Age 83-80 years, 30 pack-year currently smoking OR have quit w/in 15years.) does not qualify.   Lung Cancer Screening Referral: N/A  Additional Screening:  Hepatitis C Screening: does qualify;   Vision Screening: Recommended annual ophthalmology exams for early detection of glaucoma and other disorders of the eye. Is the patient up to date with their annual eye exam?  Yes  Who is the provider or what is the name of the office in which the patient attends annual eye exams? Dr. Brigitte Pulse If pt is not established with a provider, would they like to be referred to a provider to establish care? No .   Dental Screening: Recommended annual dental exams for proper oral hygiene  Community Resource Referral /  Chronic Care Management: CRR required this visit?  No   CCM required this visit?  No      Plan:     I have personally reviewed and noted the following in the patient's chart:   . Medical and social history . Use of alcohol, tobacco or illicit drugs  . Current medications and supplements . Functional ability and status . Nutritional status . Physical activity . Advanced directives . List of other physicians . Hospitalizations, surgeries, and ER visits in previous 12 months . Vitals . Screenings to include cognitive, depression, and falls . Referrals and appointments  In addition, I have reviewed and discussed with patient certain preventive protocols, quality metrics, and best practice recommendations. A written personalized care plan for preventive services as well as general preventive health recommendations were provided to patient.     Ofilia Neas, LPN   01/09/6062   Nurse Notes: None

## 2020-06-19 NOTE — Patient Instructions (Signed)
[name] , Thank you for taking time to come for your Medicare Wellness Visit. I appreciate your ongoing commitment to your health goals. Please review the following plan we discussed and let me know if I can assist you in the future.   Screening recommendations/referrals: Colonoscopy: Up to date, next due 07/11/2022 Mammogram: Up to date, next due 07/07/2020 Bone Density: Currently due, orders placed this visit Recommended yearly ophthalmology/optometry visit for glaucoma screening and checkup Recommended yearly dental visit for hygiene and checkup  Vaccinations: Influenza vaccine: patient declined Pneumococcal vaccine: Currently due, you may receive at your next office visit Tdap vaccine: If you have documentation of your last TDAP vaccine please let us know so that we can update your chart. Shingles vaccine: Currently due for Shingrix, please contact your pharmacy to discuss cost and to receive the vaccine    Advanced directives: Please bring copies of your medical Advanced Directives so that we may scan them into your chart.  Conditions/risks identified: None   Next appointment: None    Preventive Care 65 Years and Older, Female Preventive care refers to lifestyle choices and visits with your health care provider that can promote health and wellness. What does preventive care include?  A yearly physical exam. This is also called an annual well check.  Dental exams once or twice a year.  Routine eye exams. Ask your health care provider how often you should have your eyes checked.  Personal lifestyle choices, including:  Daily care of your teeth and gums.  Regular physical activity.  Eating a healthy diet.  Avoiding tobacco and drug use.  Limiting alcohol use.  Practicing safe sex.  Taking low-dose aspirin every day.  Taking vitamin and mineral supplements as recommended by your health care provider. What happens during an annual well check? The services and  screenings done by your health care provider during your annual well check will depend on your age, overall health, lifestyle risk factors, and family history of disease. Counseling  Your health care provider may ask you questions about your:  Alcohol use.  Tobacco use.  Drug use.  Emotional well-being.  Home and relationship well-being.  Sexual activity.  Eating habits.  History of falls.  Memory and ability to understand (cognition).  Work and work Statistician.  Reproductive health. Screening  You may have the following tests or measurements:  Height, weight, and BMI.  Blood pressure.  Lipid and cholesterol levels. These may be checked every 5 years, or more frequently if you are over 60 years old.  Skin check.  Lung cancer screening. You may have this screening every year starting at age 80 if you have a 30-pack-year history of smoking and currently smoke or have quit within the past 15 years.  Fecal occult blood test (FOBT) of the stool. You may have this test every year starting at age 21.  Flexible sigmoidoscopy or colonoscopy. You may have a sigmoidoscopy every 5 years or a colonoscopy every 10 years starting at age 80.  Hepatitis C blood test.  Hepatitis B blood test.  Sexually transmitted disease (STD) testing.  Diabetes screening. This is done by checking your blood sugar (glucose) after you have not eaten for a while (fasting). You may have this done every 1-3 years.  Bone density scan. This is done to screen for osteoporosis. You may have this done starting at age 75.  Mammogram. This may be done every 1-2 years. Talk to your health care provider about how often you should have  regular mammograms. Talk with your health care provider about your test results, treatment options, and if necessary, the need for more tests. Vaccines  Your health care provider may recommend certain vaccines, such as:  Influenza vaccine. This is recommended every  year.  Tetanus, diphtheria, and acellular pertussis (Tdap, Td) vaccine. You may need a Td booster every 10 years.  Zoster vaccine. You may need this after age 35.  Pneumococcal 13-valent conjugate (PCV13) vaccine. One dose is recommended after age 26.  Pneumococcal polysaccharide (PPSV23) vaccine. One dose is recommended after age 76. Talk to your health care provider about which screenings and vaccines you need and how often you need them. This information is not intended to replace advice given to you by your health care provider. Make sure you discuss any questions you have with your health care provider. Document Released: 10/09/2015 Document Revised: 06/01/2016 Document Reviewed: 07/14/2015 Elsevier Interactive Patient Education  2017 [place] Prevention in the Home Falls can cause injuries. They can happen to people of all ages. There are many things you can do to make your home safe and to help prevent falls. What can I do on the outside of my home?  Regularly fix the edges of walkways and driveways and fix any cracks.  Remove anything that might make you trip as you walk through a door, such as a raised step or threshold.  Trim any bushes or trees on the path to your home.  Use bright outdoor lighting.  Clear any walking paths of anything that might make someone trip, such as rocks or tools.  Regularly check to see if handrails are loose or broken. Make sure that both sides of any steps have handrails.  Any raised decks and porches should have guardrails on the edges.  Have any leaves, snow, or ice cleared regularly.  Use sand or salt on walking paths during winter.  Clean up any spills in your garage right away. This includes oil or grease spills. What can I do in the bathroom?  Use night lights.  Install grab bars by the toilet and in the tub and shower. Do not use towel bars as grab bars.  Use non-skid mats or decals in the tub or shower.  If you  need to sit down in the shower, use a plastic, non-slip stool.  Keep the floor dry. Clean up any water that spills on the floor as soon as it happens.  Remove soap buildup in the tub or shower regularly.  Attach bath mats securely with double-sided non-slip rug tape.  Do not have throw rugs and other things on the floor that can make you trip. What can I do in the bedroom?  Use night lights.  Make sure that you have a light by your bed that is easy to reach.  Do not use any sheets or blankets that are too big for your bed. They should not hang down onto the floor.  Have a firm chair that has side arms. You can use this for support while you get dressed.  Do not have throw rugs and other things on the floor that can make you trip. What can I do in the kitchen?  Clean up any spills right away.  Avoid walking on wet floors.  Keep items that you use a lot in easy-to-reach places.  If you need to reach something above you, use a strong step stool that has a grab bar.  Keep electrical cords out of the  way.  Do not use floor polish or wax that makes floors slippery. If you must use wax, use non-skid floor wax.  Do not have throw rugs and other things on the floor that can make you trip. What can I do with my stairs?  Do not leave any items on the stairs.  Make sure that there are handrails on both sides of the stairs and use them. Fix handrails that are broken or loose. Make sure that handrails are as long as the stairways.  Check any carpeting to make sure that it is firmly attached to the stairs. Fix any carpet that is loose or worn.  Avoid having throw rugs at the top or bottom of the stairs. If you do have throw rugs, attach them to the floor with carpet tape.  Make sure that you have a light switch at the top of the stairs and the bottom of the stairs. If you do not have them, ask someone to add them for you. What else can I do to help prevent falls?  Wear shoes  that:  Do not have high heels.  Have rubber bottoms.  Are comfortable and fit you well.  Are closed at the toe. Do not wear sandals.  If you use a stepladder:  Make sure that it is fully opened. Do not climb a closed stepladder.  Make sure that both sides of the stepladder are locked into place.  Ask someone to hold it for you, if possible.  Clearly mark and make sure that you can see:  Any grab bars or handrails.  First and last steps.  Where the edge of each step is.  Use tools that help you move around (mobility aids) if they are needed. These include:  Canes.  Walkers.  Scooters.  Crutches.  Turn on the lights when you go into a dark area. Replace any light bulbs as soon as they burn out.  Set up your furniture so you have a clear path. Avoid moving your furniture around.  If any of your floors are uneven, fix them.  If there are any pets around you, be aware of where they are.  Review your medicines with your doctor. Some medicines can make you feel dizzy. This can increase your chance of falling. Ask your doctor what other things that you can do to help prevent falls. This information is not intended to replace advice given to you by your health care provider. Make sure you discuss any questions you have with your health care provider. Document Released: 07/09/2009 Document Revised: 02/18/2016 Document Reviewed: 10/17/2014 Elsevier Interactive Patient Education  2017 [employer].

## 2020-07-02 DIAGNOSIS — Z6837 Body mass index (BMI) 37.0-37.9, adult: Secondary | ICD-10-CM | POA: Diagnosis not present

## 2020-07-02 DIAGNOSIS — Z01419 Encounter for gynecological examination (general) (routine) without abnormal findings: Secondary | ICD-10-CM | POA: Diagnosis not present

## 2020-07-08 ENCOUNTER — Other Ambulatory Visit: Payer: Self-pay | Admitting: Obstetrics & Gynecology

## 2020-07-08 DIAGNOSIS — N644 Mastodynia: Secondary | ICD-10-CM

## 2020-07-10 ENCOUNTER — Ambulatory Visit: Payer: Medicare Other

## 2020-07-28 ENCOUNTER — Ambulatory Visit
Admission: RE | Admit: 2020-07-28 | Discharge: 2020-07-28 | Disposition: A | Discharge status: Home / Self Care | Payer: Medicare Other | Source: Ambulatory Visit | Attending: Obstetrics & Gynecology | Admitting: Obstetrics & Gynecology

## 2020-07-28 ENCOUNTER — Other Ambulatory Visit: Payer: Self-pay

## 2020-07-28 DIAGNOSIS — N644 Mastodynia: Secondary | ICD-10-CM

## 2020-07-29 DIAGNOSIS — H40013 Open angle with borderline findings, low risk, bilateral: Secondary | ICD-10-CM | POA: Diagnosis not present

## 2020-08-14 DIAGNOSIS — M79605 Pain in left leg: Secondary | ICD-10-CM | POA: Diagnosis not present

## 2020-08-14 DIAGNOSIS — I87323 Chronic venous hypertension (idiopathic) with inflammation of bilateral lower extremity: Secondary | ICD-10-CM | POA: Diagnosis not present

## 2020-08-14 DIAGNOSIS — M79604 Pain in right leg: Secondary | ICD-10-CM | POA: Diagnosis not present

## 2020-08-31 ENCOUNTER — Other Ambulatory Visit: Payer: Self-pay | Admitting: Family Medicine

## 2020-08-31 DIAGNOSIS — E785 Hyperlipidemia, unspecified: Secondary | ICD-10-CM

## 2020-09-05 DIAGNOSIS — Z20822 Contact with and (suspected) exposure to covid-19: Secondary | ICD-10-CM | POA: Diagnosis not present

## 2020-09-05 DIAGNOSIS — Z03818 Encounter for observation for suspected exposure to other biological agents ruled out: Secondary | ICD-10-CM | POA: Diagnosis not present

## 2020-09-08 DIAGNOSIS — M79604 Pain in right leg: Secondary | ICD-10-CM | POA: Diagnosis not present

## 2020-09-08 DIAGNOSIS — M79605 Pain in left leg: Secondary | ICD-10-CM | POA: Diagnosis not present

## 2020-09-17 DIAGNOSIS — Z20822 Contact with and (suspected) exposure to covid-19: Secondary | ICD-10-CM | POA: Diagnosis not present

## 2020-09-22 ENCOUNTER — Other Ambulatory Visit: Payer: Self-pay | Admitting: Family Medicine

## 2020-09-22 DIAGNOSIS — E2839 Other primary ovarian failure: Secondary | ICD-10-CM

## 2020-09-29 ENCOUNTER — Other Ambulatory Visit: Payer: Medicare Other

## 2020-09-29 DIAGNOSIS — I8312 Varicose veins of left lower extremity with inflammation: Secondary | ICD-10-CM | POA: Diagnosis not present

## 2020-10-06 ENCOUNTER — Other Ambulatory Visit: Payer: Self-pay

## 2020-10-06 DIAGNOSIS — I10 Essential (primary) hypertension: Secondary | ICD-10-CM | POA: Insufficient documentation

## 2020-10-06 DIAGNOSIS — E785 Hyperlipidemia, unspecified: Secondary | ICD-10-CM | POA: Insufficient documentation

## 2020-10-06 DIAGNOSIS — M1990 Unspecified osteoarthritis, unspecified site: Secondary | ICD-10-CM | POA: Insufficient documentation

## 2020-10-08 ENCOUNTER — Ambulatory Visit (INDEPENDENT_AMBULATORY_CARE_PROVIDER_SITE_OTHER): Payer: Medicare Other | Admitting: Cardiology

## 2020-10-08 ENCOUNTER — Other Ambulatory Visit: Payer: Self-pay

## 2020-10-08 ENCOUNTER — Encounter: Payer: Self-pay | Admitting: Cardiology

## 2020-10-08 VITALS — BP 180/110 | HR 95 | Ht 64.5 in | Wt 205.0 lb

## 2020-10-08 DIAGNOSIS — R001 Bradycardia, unspecified: Secondary | ICD-10-CM | POA: Diagnosis not present

## 2020-10-08 DIAGNOSIS — I10 Essential (primary) hypertension: Secondary | ICD-10-CM

## 2020-10-08 DIAGNOSIS — R079 Chest pain, unspecified: Secondary | ICD-10-CM

## 2020-10-08 DIAGNOSIS — R0789 Other chest pain: Secondary | ICD-10-CM

## 2020-10-08 NOTE — Progress Notes (Signed)
Cardiology Office Note:    Date:  10/08/2020   ID:  [name], DOB 1951/07/11, MRN [id number]  PCP:  Billie Ruddy, MD  Cardiologist:  Jenne Campus, MD    Referring MD: Billie Ruddy, MD   Chief Complaint  Patient presents with  . Chest Pain  I have a chest pain  History of Present Illness:    Nancy Christensen is a 70 y.o. female with past medical history significant for essential hypertension, sinus bradycardia, dyslipidemia.  She been following up with [place] with first-degree AV block and sinus bradycardia likely asymptomatic no dizziness no passing out.  She actually came today because of chest pain she localized pain to the left upper portion of her chest is kind of sharp stabbing without radiation and sometimes pinches sometimes pain is in the left shoulder sometimes pain in the right shoulder last typically for few seconds, she said when she twist and move her body a certain weight may be worse.  Interestingly she walk and exercise on the regular basis she does do some back exercises as well as walking she has never had pain in the chest when she does this.  Usually can happen when she is at rest I have admitted the pain is very atypical.  No dizziness no syncope  Past Medical History:  Diagnosis Date  . Arthritis    left hip and knee  . Atypical chest pain 10/25/2018  . Cholecystitis with cholelithiasis 02/11/2018  . Chronic right shoulder pain 07/04/2018  . 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 Dizziness 10/25/2018  . Dyslipidemia 10/25/2018  . Essential hypertension 04/28/2018   Changed arb to CCB 04/24/2018 due to cough x one month trial   . Hyperlipidemia   . Hypertension   . Hypertensive disorder 04/18/2018  . Multiple pulmonary nodules determined by computed tomography of lung 01/26/2018   Passive smoke exp/ remote  Chest CT  09/24/17 MPN's  Largest 4 x 6 mm R laterally  > rec f/u 09/14/18 (reminder file)   As pt reports neg CT 2015 (not avail)  - ESR  01/25/2018 = 35  - CT chest 07/11/18 =  5 mm > no f/u needed   . Primary osteoarthritis of left hip 11/26/2019  . Primary osteoarthritis of left knee 03/11/2020  . Rectal pain   . Sinus bradycardia 10/25/2018  . Unilateral primary osteoarthritis, left knee 06/14/2018  . Upper airway cough syndrome 01/25/2018   Onset 1980 CT head 12/22/17 neg sinus dz FENO 01/25/2018  =   21 - Allergy profile 01/25/2018 >  Eos 0.1 /  IgE  17 RAST neg  - flare 02/12/18 p et  - gabapentin 100 mg tid 03/23/2018 > increased to max of 300 tid 04/24/2018 > improved so increased to 300 qid 06/05/2018 > d/c'd around 02/2019 s flare     Past Surgical History:  Procedure Laterality Date  . ABDOMINAL HYSTERECTOMY  1997  . ANAL RECTAL MANOMETRY N/A 08/09/2019   Procedure: ANO RECTAL MANOMETRY;  Surgeon: Thornton Park, MD;  Location: [facility];  Service: Gastroenterology;  Laterality: N/A;  . BREAST SURGERY  2002   left cyst removal   . CHOLECYSTECTOMY N/A 02/12/2018   Procedure: LAPAROSCOPIC CHOLECYSTECTOMY;  Surgeon: Leighton Ruff, MD;  Location: [facility];  Service: General;  Laterality: N/A;  . COLONOSCOPY    . POLYPECTOMY      Current Medications: Current Meds  Medication Sig  . BILE SALTS-CAPS-CASC-PHENOLPTH PO Take by mouth 3 (three)  times daily. Once after each meal   . cholecalciferol (VITAMIN D3) 25 MCG (1000 UT) tablet Take 1,000 Units by mouth daily.  . diclofenac sodium (VOLTAREN) 1 % GEL APPLY 2 G TOPICALLY 4 (FOUR) TIMES DAILY AS NEEDED FOR KNEE PAIN  . rosuvastatin (CRESTOR) 10 MG tablet TAKE 1 TABLET BY MOUTH EVERY DAY  . vitamin B-12 (CYANOCOBALAMIN) 1000 MCG tablet Take 1,000 mcg by mouth daily.  . vitamin C (ASCORBIC ACID) 500 MG tablet Take 500 mg by mouth daily.  . [DISCONTINUED] amLODipine (NORVASC) 5 MG tablet Take 1 tablet by mouth daily.     Allergies:   Patient has no known allergies.   Social History   Socioeconomic History  . Marital status: Divorced    Spouse name: Not on file  . Number of  children: Not on file  . Years of education: Not on file  . Highest education level: Not on file  Occupational History  . Not on file  Tobacco Use  . Smoking status: Never Smoker  . Smokeless tobacco: Never Used  Vaping Use  . Vaping Use: Never used  Substance and Sexual Activity  . Alcohol use: Never  . Drug use: Not Currently  . Sexual activity: Not on file  Other Topics Concern  . Not on file  Social History Narrative  . Not on file   Social Determinants of Health   Financial Resource Strain: Low Risk   . Difficulty of Paying Living Expenses: Not hard at all  Food Insecurity: No Food Insecurity  . Worried About Charity fundraiser in the Last Year: Never true  . Ran Out of Food in the Last Year: Never true  Transportation Needs: No Transportation Needs  . Lack of Transportation (Medical): No  . Lack of Transportation (Non-Medical): No  Physical Activity: Sufficiently Active  . Days of Exercise per Week: 7 days  . Minutes of Exercise per Session: 30 min  Stress: No Stress Concern Present  . Feeling of Stress : Not at all  Social Connections: Moderately Integrated  . Frequency of Communication with Friends and Family: More than three times a week  . Frequency of Social Gatherings with Friends and Family: Once a week  . Attends Religious Services: More than 4 times per year  . Active Member of Clubs or Organizations: Yes  . Attends Archivist Meetings: More than 4 times per year  . Marital Status: Divorced     Family History: The patient's family history includes Colon cancer in her maternal uncle; Diabetes in her mother; Hypertension in her mother; Lung cancer in her father; Thyroid disease in her mother; Vascular Disease in her mother. There is no history of Colon polyps, Esophageal cancer, Stomach cancer, Rectal cancer, or Breast cancer. ROS:   Please see the history of present illness.    All 14 point review of systems negative except as described per  history of present illness  EKGs/Labs/Other Studies Reviewed:      Recent Labs: No results found for requested labs within last 8760 hours.  Recent Lipid Panel    Component Value Date/Time   CHOL 212 (H) 04/03/2020 1115   CHOL 272 (H) 12/04/2018 1119   TRIG 71 04/03/2020 1115   HDL 55 04/03/2020 1115   HDL 56 12/04/2018 1119   CHOLHDL 3.9 04/03/2020 1115   VLDL 26.6 06/19/2019 0849   LDLCALC 140 (H) 04/03/2020 1115    Physical Exam:    VS:  BP (!) 180/110 (BP Location:  Right Arm, Patient Position: Sitting)   Pulse 95   Ht 5' 4.5" (1.638 m)   Wt 205 lb (93 kg)   SpO2 96%   BMI 34.64 kg/m     Wt Readings from Last 3 Encounters:  10/08/20 205 lb (93 kg)  05/12/20 210 lb (95.3 kg)  04/03/20 206 lb (93.4 kg)     GEN:  Well nourished, well developed in no acute distress HEENT: Normal NECK: No JVD; No carotid bruits LYMPHATICS: No lymphadenopathy CARDIAC: RRR, no murmurs, no rubs, no gallops RESPIRATORY:  Clear to auscultation without rales, wheezing or rhonchi  ABDOMEN: Soft, non-tender, non-distended MUSCULOSKELETAL:  No edema; No deformity  SKIN: Warm and dry LOWER EXTREMITIES: no swelling NEUROLOGIC:  Alert and oriented x 3 PSYCHIATRIC:  Normal affect   ASSESSMENT:    1. Chest pain of uncertain etiology   2. Sinus bradycardia   3. Essential hypertension   4. Atypical chest pain    PLAN:    In order of problems listed above:  1. Chest pain which is very atypical doubt very much this is cardiac related on top of that on physical examination pressing the chest wall we will treat as partially pain.  I recommend nonsteroidal anti-inflammatory medications ibuprofen 200 mg 3 times daily for 5 days and see if that helps. 2. Sinus bradycardia no symptoms no dizziness no passing out.  We will continue monitoring. 3. Essential hypertension blood pressure elevated today but she check her blood pressure at home is always very good with typical numbers between 1 79-4 25  systolic.  Therefore, we will continue present management. 4. Dyslipidemia: I will check her fasting lipid profile.  She is on Crestor already.  I did review her K PN from summer which showed LDL of 140 and HDL 55   Medication Adjustments/Labs and Tests Ordered: Current medicines are reviewed at length with the patient today.  Concerns regarding medicines are outlined above.  Orders Placed This Encounter  Procedures  . EKG 12-Lead   Medication changes: No orders of the defined types were placed in this encounter.   Signed, Park Liter, MD, [facility] 10/08/2020 2:22 PM    [facility]

## 2020-10-08 NOTE — Patient Instructions (Signed)
Medication Instructions:  Your physician has recommended you make the following change in your medication:   TAKE: Ibuprofen 200mg  daily for 5 days   *If you need a refill on your cardiac medications before your next appointment, please call your pharmacy*   Lab Work: Your physician recommends that you return for lab work today: lipid  If you have labs (blood work) drawn today and your tests are completely normal, you will receive your results only by: Marland Kitchen MyChart Message (if you have MyChart) OR . A paper copy in the mail If you have any lab test that is abnormal or we need to change your treatment, we will call you to review the results.   Testing/Procedures: None   Follow-Up: At [facility], you and your health needs are our priority.  As part of our continuing mission to provide you with exceptional heart care, we have created designated Provider Care Teams.  These Care Teams include your primary Cardiologist (physician) and Advanced Practice Providers (APPs -  Physician Assistants and Nurse Practitioners) who all work together to provide you with the care you need, when you need it.  We recommend signing up for the patient portal called "MyChart".  Sign up information is provided on this After Visit Summary.  MyChart is used to connect with patients for Virtual Visits (Telemedicine).  Patients are able to view lab/test results, encounter notes, upcoming appointments, etc.  Non-urgent messages can be sent to your provider as well.   To learn more about what you can do with MyChart, go to [email or web address].    Your next appointment:   5 month(s)  The format for your next appointment:   In Person  Provider:   Jenne Campus, MD   Other Instructions

## 2020-10-09 LAB — LIPID PANEL
Chol/HDL Ratio: 3.6 ratio (ref 0.0–4.4)
Cholesterol, Total: 279 mg/dL — ABNORMAL HIGH (ref 100–199)
HDL: 77 mg/dL (ref >39)
LDL Chol Calc (NIH): 183 mg/dL — ABNORMAL HIGH (ref 0–99)
Triglycerides: 112 mg/dL (ref 0–149)
VLDL Cholesterol Cal: 19 mg/dL (ref 5–40)

## 2020-10-13 ENCOUNTER — Telehealth: Payer: Self-pay | Admitting: Emergency Medicine

## 2020-10-13 DIAGNOSIS — I8312 Varicose veins of left lower extremity with inflammation: Secondary | ICD-10-CM | POA: Diagnosis not present

## 2020-10-13 DIAGNOSIS — E785 Hyperlipidemia, unspecified: Secondary | ICD-10-CM

## 2020-10-13 MED ORDER — ROSUVASTATIN CALCIUM 20 MG PO TABS: 20.0000 mg | ORAL_TABLET | Freq: Every day | ORAL | 1 refills | Status: DC

## 2020-10-13 NOTE — Telephone Encounter (Signed)
Called patient informed her of results. Advised her to increase crestor to 20 mg daily and repeat labs in 6 weeks. She verbally understood no further questions.

## 2020-10-13 NOTE — Telephone Encounter (Signed)
-----   Message from Park Liter, MD sent at 10/09/2020  9:14 AM EST ----- Cholesterol still not well controlled.  I recommend doubling the dose of Crestor to 20 mg daily, fasting lipid profile AST ALT in 6 weeks

## 2020-10-21 IMAGING — MR MR SHOULDER*R* W/O CM
4 of 5 series · 21 of 40 positions shown · non-contrast
Comparison: None.

CLINICAL DATA: Right shoulder pain radiating into the upper arm for
3-5 years.

EXAM:
MRI OF THE RIGHT SHOULDER WITHOUT CONTRAST
TECHNIQUE: Multiplanar, multisequence MR imaging of the shoulder was performed.
No intravenous contrast was administered.

[Series 5: PD fat-sat · axial · right · 4.0mm · 0.44mm/px · z∈[-43,+61]mm · 8 of 23 slices shown (1 of 2)]
[im 1/23]
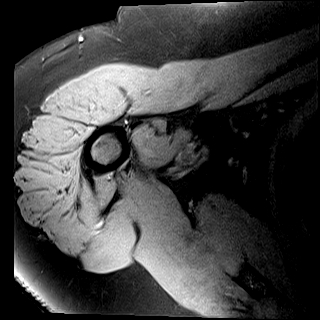
[im 4/23]
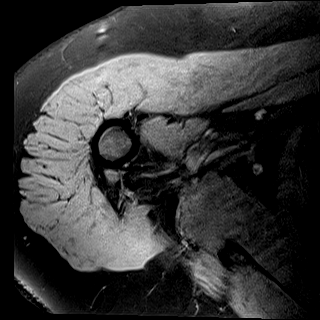
[im 7/23]
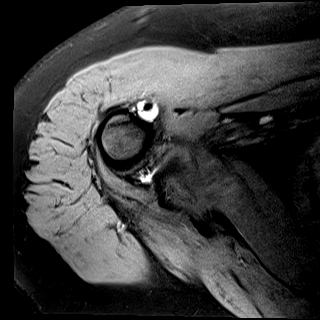
[im 10/23]
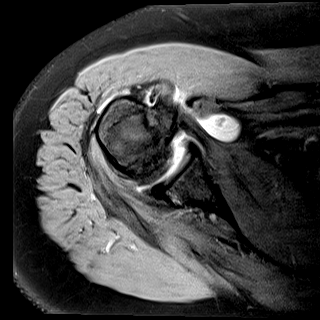
[im 13/23]
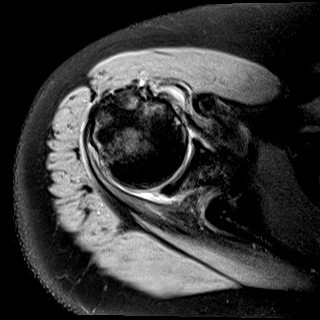
[im 16/23]
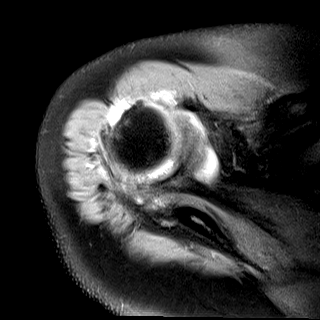
[im 19/23]
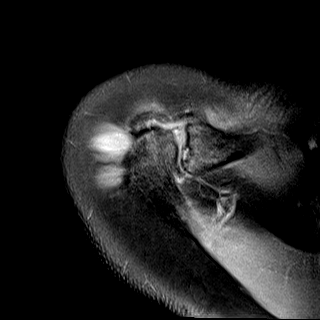
[im 23/23]
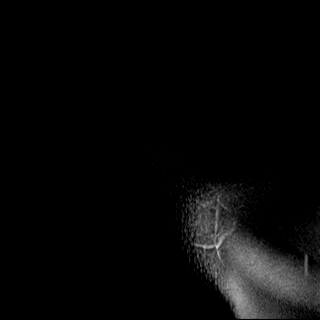

[Series 6: T2 fat-sat · oblique · right · 4.0mm · 0.22mm/px · 3 of 21 slices shown (1 of 2)]
[im 3/21]
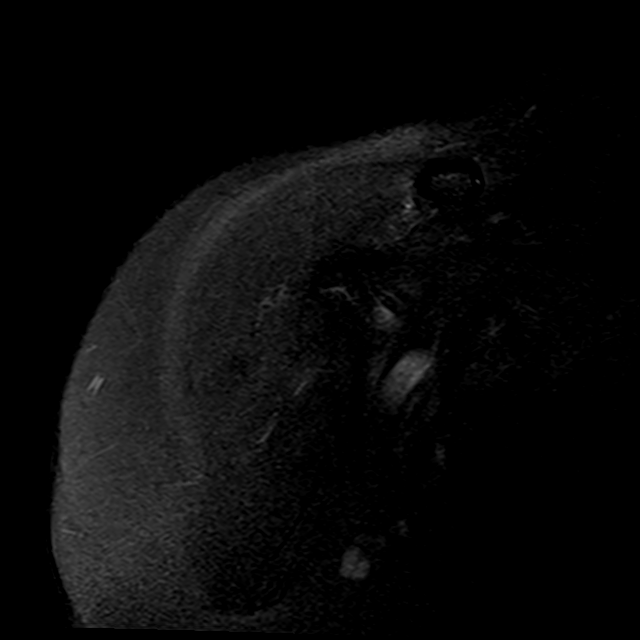
[im 12/21]
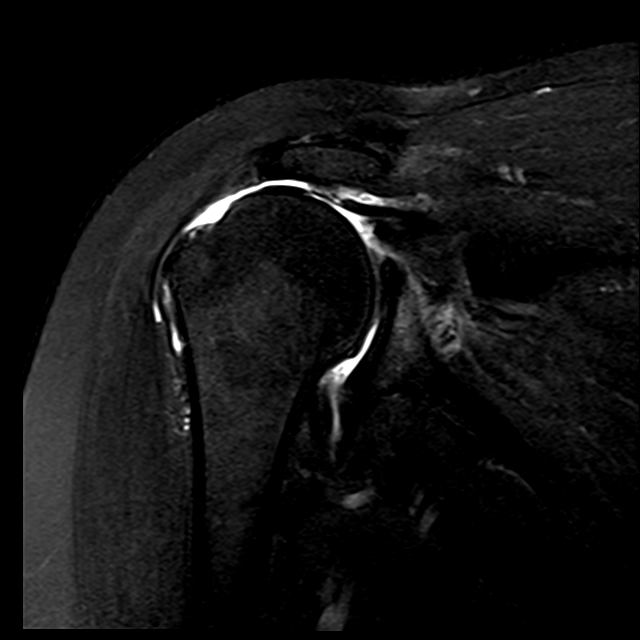
[im 18/21]
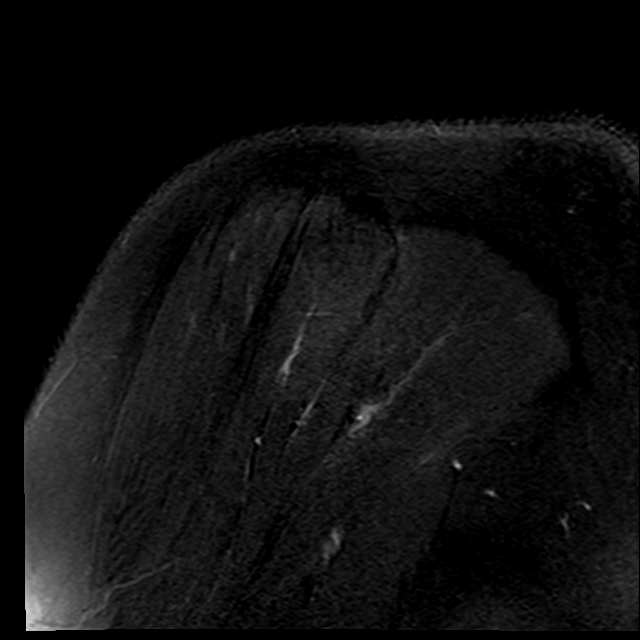

[Series 7: PD fat-sat · oblique · right · 4.0mm · 0.22mm/px · 7 of 21 slices shown (2 of 2)]
[im 1/21]
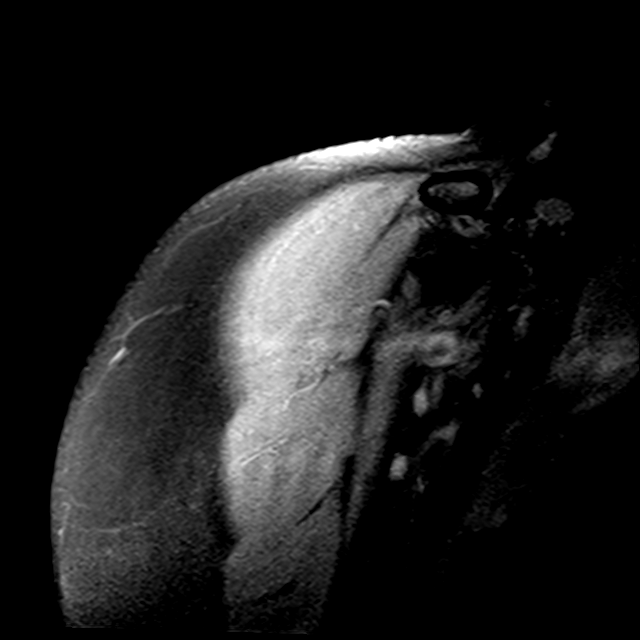
[im 3/21]
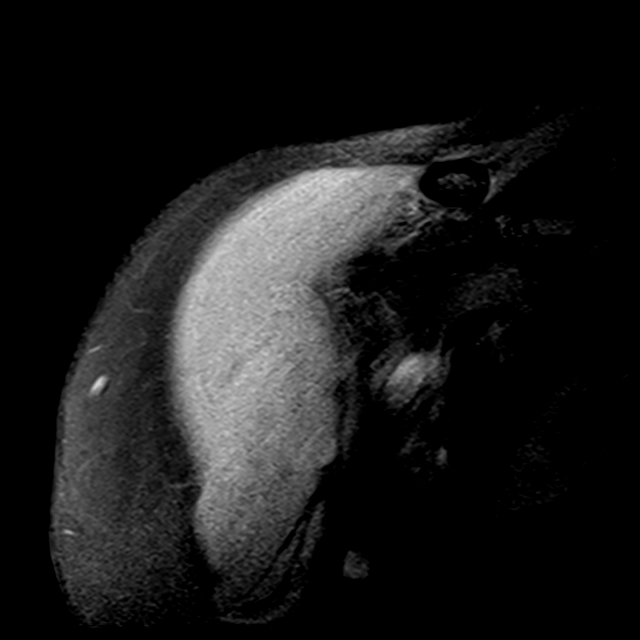
[im 6/21]
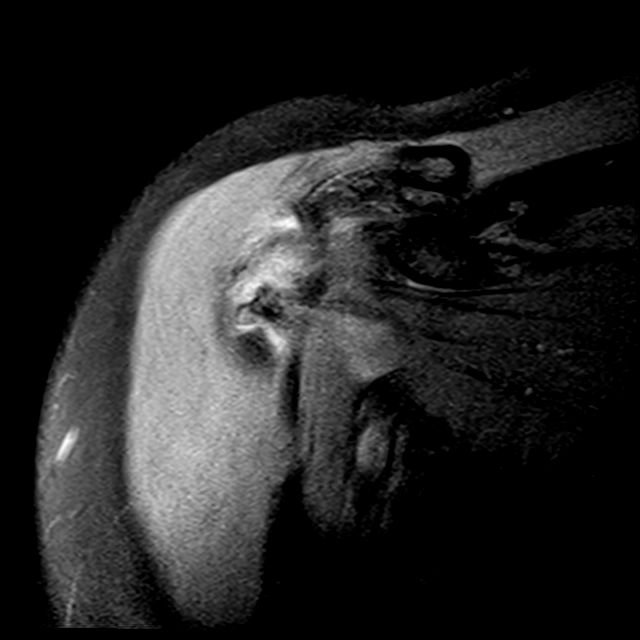
[im 9/21]
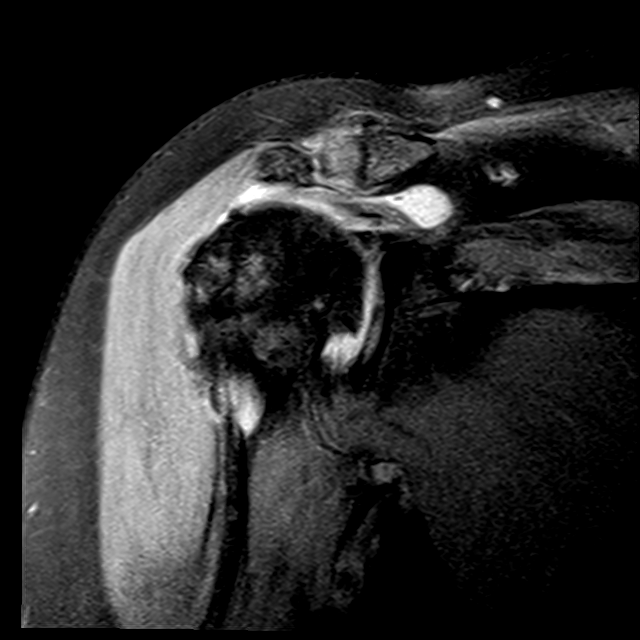
[im 12/21]
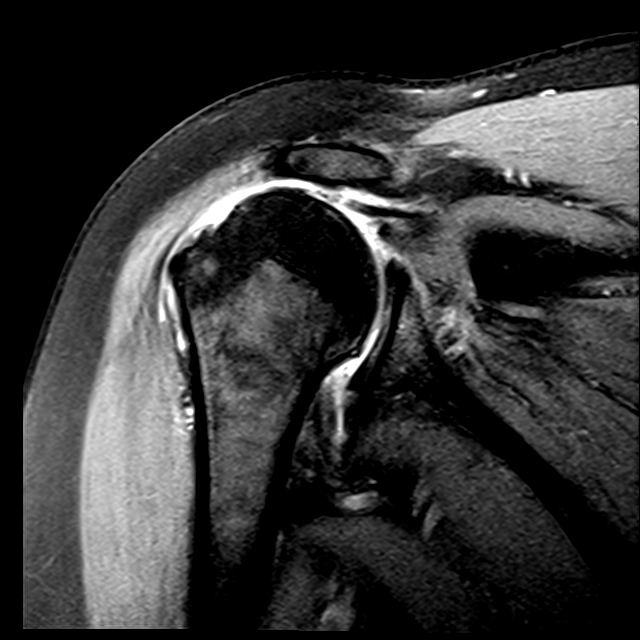
[im 15/21]
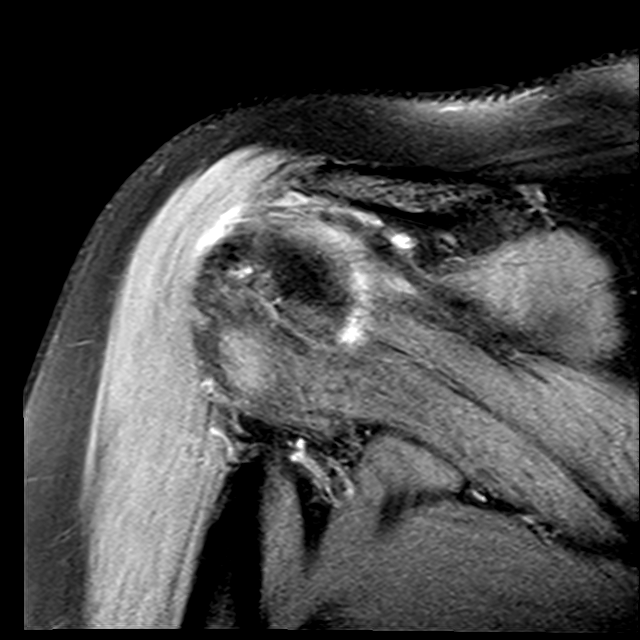
[im 18/21]
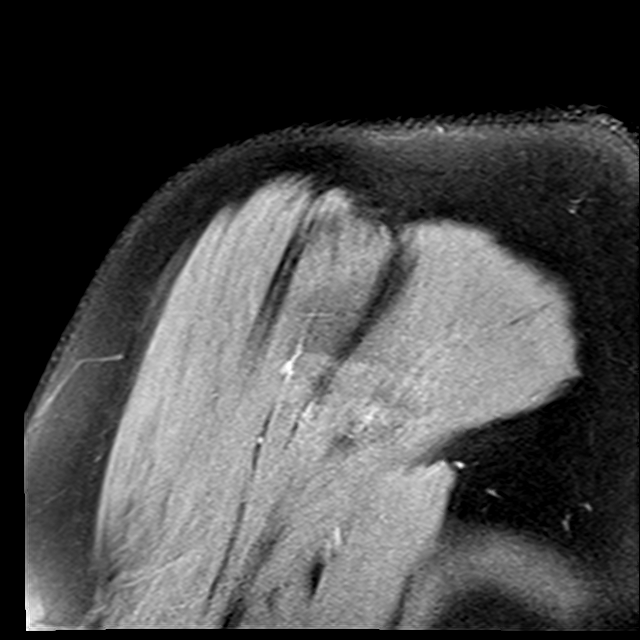

[Series 8: T2 fat-sat · oblique · right · 4.0mm · 0.44mm/px · 3 of 23 slices shown (2 of 2)]
[im 4/23]
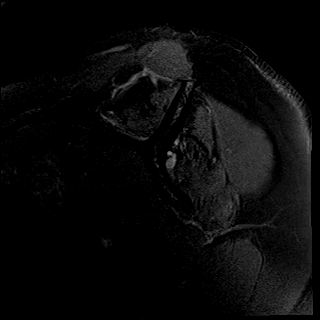
[im 13/23]
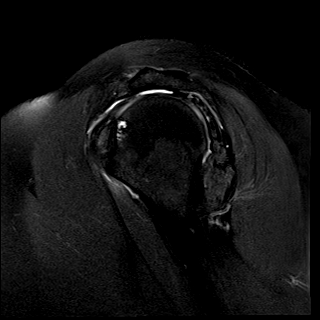
[im 19/23]
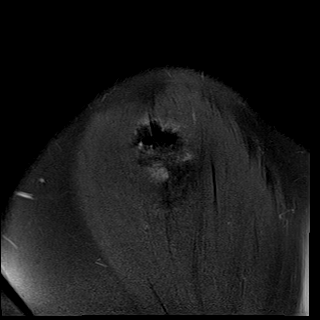

[21 of 40 positions shown; findings below may reference images not displayed]

FINDINGS: Rotator cuff: The patient has a complete supraspinatus tear
extending into the leading edge of the infraspinatus. Retraction is
at and medial to the top of the humeral head, 3-4 cm. Also seen is
severe subscapularis tendinopathy with a longitudinal split tear of
the tendon.

Muscles: Mild supraspinatus and infraspinatus atrophy is present. No
focal lesion.

Biceps long head: Intact. Tendinopathy in the bicipital interval is
noted.

Acromioclavicular Joint: Moderate osteoarthritis is present. Type 2
acromion. The patient has a pre acromion type os acromiale. There is
some fluid in the subacromial/subdeltoid bursa.

Glenohumeral Joint: Mild degenerative change is seen.

Labrum:  Intact.

Bones:  No fracture or worrisome lesion.

Other: None.
IMPRESSION: Severe rotator cuff tendinopathy with a complete tear of the
supraspinatus extending into the leading edge of the infraspinatus.
Retraction is 3-4 cm and there is mild atrophy of both muscle
bellies. Large longitudinal split tear of the subscapularis is also
identified.

Tendinopathy of the long head of biceps in the bicipital interval.
No tear.

Moderate acromioclavicular osteoarthritis. Pre acromion type os
acromiale noted.

Subacromial/subdeltoid fluid compatible with bursitis.

## 2020-10-27 DIAGNOSIS — I8312 Varicose veins of left lower extremity with inflammation: Secondary | ICD-10-CM | POA: Diagnosis not present

## 2020-11-02 ENCOUNTER — Encounter: Payer: Self-pay | Admitting: Family Medicine

## 2020-11-02 ENCOUNTER — Telehealth (INDEPENDENT_AMBULATORY_CARE_PROVIDER_SITE_OTHER): Payer: Medicare Other | Admitting: Family Medicine

## 2020-11-02 DIAGNOSIS — G4700 Insomnia, unspecified: Secondary | ICD-10-CM

## 2020-11-02 DIAGNOSIS — M791 Myalgia, unspecified site: Secondary | ICD-10-CM | POA: Diagnosis not present

## 2020-11-02 DIAGNOSIS — I10 Essential (primary) hypertension: Secondary | ICD-10-CM | POA: Diagnosis not present

## 2020-11-02 MED ORDER — IRBESARTAN 75 MG PO TABS: 37.5000 mg | ORAL_TABLET | Freq: Every day | ORAL | 2 refills | Status: DC

## 2020-11-02 NOTE — Progress Notes (Signed)
Virtual Visit via Video Note  I connected with Nancy Christensen on 11/02/20 at 11:00 AM EST by a video enabled telemedicine application 2/2 [id number] pandemic and verified that I am speaking with the correct person using two identifiers.  Location patient: home Location provider:work or home office Persons participating in the virtual visit: patient, provider  I discussed the limitations of evaluation and management by telemedicine and the availability of in person appointments. The patient expressed understanding and agreed to proceed.   HPI: Pt picked up a few lbs over the holidays.  Pt notes pain in bicep of LUE and RUE.  A throbbing sensation started 2 wks ago.  Pt denies joint pain.  Using voltaren gel.  Notes elevation in bp 147/89 and HA.  Drinking more water, at least 60 oz. Typically 118/65-67.  Pt has not been exercising as much, under some family stress, and not eating healthy.  Pt trying to do more exercise around the house.  Norvasc caused edema.  Diltiazem caused bradycardia.   Losartan d/c'd 2/2 concerns about vascular permability and NO2 pathways.  [place] seemed to cause bronchitis like sx.  Pt with insomnia.  Mind racing at night.  ROS: See pertinent positives and negatives per HPI.  Past Medical History:  Diagnosis Date  . Arthritis    left hip and knee  . Atypical chest pain 10/25/2018  . Cholecystitis with cholelithiasis 02/11/2018  . Chronic right shoulder pain 07/04/2018  . 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 Dizziness 10/25/2018  . Dyslipidemia 10/25/2018  . Essential hypertension 04/28/2018   Changed arb to CCB 04/24/2018 due to cough x one month trial   . Hyperlipidemia   . Hypertension   . Hypertensive disorder 04/18/2018  . Multiple pulmonary nodules determined by computed tomography of lung 01/26/2018   Passive smoke exp/ remote  Chest CT  09/24/17 MPN's  Largest 4 x 6 mm R laterally  > rec f/u 09/14/18 (reminder file)   As pt  reports neg CT 2015 (not avail)  - ESR 01/25/2018 = 35  - CT chest 07/11/18 =  5 mm > no f/u needed   . Primary osteoarthritis of left hip 11/26/2019  . Primary osteoarthritis of left knee 03/11/2020  . Rectal pain   . Sinus bradycardia 10/25/2018  . Unilateral primary osteoarthritis, left knee 06/14/2018  . Upper airway cough syndrome 01/25/2018   Onset 1980 CT head 12/22/17 neg sinus dz FENO 01/25/2018  =   21 - Allergy profile 01/25/2018 >  Eos 0.1 /  IgE  17 RAST neg  - flare 02/12/18 p et  - gabapentin 100 mg tid 03/23/2018 > increased to max of 300 tid 04/24/2018 > improved so increased to 300 qid 06/05/2018 > d/c'd around 02/2019 s flare     Past Surgical History:  Procedure Laterality Date  . ABDOMINAL HYSTERECTOMY  1997  . ANAL RECTAL MANOMETRY N/A 08/09/2019   Procedure: ANO RECTAL MANOMETRY;  Surgeon: Thornton Park, MD;  Location: [facility];  Service: Gastroenterology;  Laterality: N/A;  . BREAST SURGERY  2002   left cyst removal   . CHOLECYSTECTOMY N/A 02/12/2018   Procedure: LAPAROSCOPIC CHOLECYSTECTOMY;  Surgeon: Leighton Ruff, MD;  Location: [facility];  Service: General;  Laterality: N/A;  . COLONOSCOPY    . POLYPECTOMY      Family History  Problem Relation Age of Onset  . Diabetes Mother   . Hypertension Mother   . Thyroid disease Mother   .  Vascular Disease Mother   . Lung cancer Father   . Colon cancer Maternal Uncle   . Colon polyps Neg Hx   . Esophageal cancer Neg Hx   . Stomach cancer Neg Hx   . Rectal cancer Neg Hx   . Breast cancer Neg Hx      Current Outpatient Medications:  .  BILE SALTS-CAPS-CASC-PHENOLPTH PO, Take by mouth 3 (three) times daily. Once after each meal , Disp: , Rfl:  .  cholecalciferol (VITAMIN D3) 25 MCG (1000 UT) tablet, Take 1,000 Units by mouth daily., Disp: , Rfl:  .  diclofenac sodium (VOLTAREN) 1 % GEL, APPLY 2 G TOPICALLY 4 (FOUR) TIMES DAILY AS NEEDED FOR KNEE PAIN, Disp: , Rfl:  .  rosuvastatin (CRESTOR) 20 MG tablet, Take 1 tablet (20  mg total) by mouth daily., Disp: 90 tablet, Rfl: 1 .  vitamin B-12 (CYANOCOBALAMIN) 1000 MCG tablet, Take 1,000 mcg by mouth daily., Disp: , Rfl:  .  vitamin C (ASCORBIC ACID) 500 MG tablet, Take 500 mg by mouth daily., Disp: , Rfl:   EXAM:  VITALS per patient if applicable: RR between 44-31 bpm  GENERAL: alert, oriented, appears well and in no acute distress  HEENT: atraumatic, conjunctiva clear, no obvious abnormalities on inspection of external nose and ears  NECK: normal movements of the head and neck  LUNGS: on inspection no signs of respiratory distress, breathing rate appears normal, no obvious gross SOB, gasping or wheezing  CV: no obvious cyanosis  MS: moves all visible extremities without noticeable abnormality  PSYCH/NEURO: pleasant and cooperative, no obvious depression or anxiety, speech and thought processing grossly intact  ASSESSMENT AND PLAN:  Discussed the following assessment and plan:  Myalgia -Discussed possible causes including statin. -Will do a trial discontinuation of Crestor 20 mg daily to see if myalgias improve. For improvement in symptoms discussed alternative ways to manage cholesterol including biweekly or 3 times weekly statin dosing versus using other medications. -For continued symptoms despite discontinuation discussed obtaining labs -Given precautions  Insomnia, unspecified type -Discussed sleep hygiene -Anxiety likely contributing -Discussed self-care -We will follow up in the next few weeks for continued symptoms  Essential hypertension  -Elevated -Discussed lifestyle modifications including decreasing sodium intake and increasing p.o. intake of water -We will do a trial of irbesartan 37.5 mg daily. For new or increased respiratory symptoms will need to change medication to another class. -Continue to monitor BP at home and keep a log to bring with you to clinic -Given strict precautions - Plan: irbesartan (AVAPRO) 75 MG  tablet  Follow-up in 2-4 weeks, sooner if needed   I discussed the assessment and treatment plan with the patient. The patient was provided an opportunity to ask questions and all were answered. The patient agreed with the plan and demonstrated an understanding of the instructions.   The patient was advised to call back or seek an in-person evaluation if the symptoms worsen or if the condition fails to improve as anticipated.    Billie Ruddy, MD

## 2020-11-04 ENCOUNTER — Ambulatory Visit: Payer: Medicare Other | Admitting: Family Medicine

## 2020-11-05 ENCOUNTER — Ambulatory Visit: Payer: Medicare Other | Admitting: Family Medicine

## 2020-11-05 DIAGNOSIS — Z0289 Encounter for other administrative examinations: Secondary | ICD-10-CM

## 2020-11-17 DIAGNOSIS — I8312 Varicose veins of left lower extremity with inflammation: Secondary | ICD-10-CM | POA: Diagnosis not present

## 2020-11-18 ENCOUNTER — Encounter: Payer: Self-pay | Admitting: Family Medicine

## 2020-11-30 ENCOUNTER — Other Ambulatory Visit: Payer: Self-pay | Admitting: Family Medicine

## 2020-11-30 DIAGNOSIS — E785 Hyperlipidemia, unspecified: Secondary | ICD-10-CM

## 2020-12-15 DIAGNOSIS — M79605 Pain in left leg: Secondary | ICD-10-CM | POA: Diagnosis not present

## 2020-12-15 DIAGNOSIS — I8312 Varicose veins of left lower extremity with inflammation: Secondary | ICD-10-CM | POA: Diagnosis not present

## 2020-12-16 ENCOUNTER — Other Ambulatory Visit: Payer: Self-pay | Admitting: Family Medicine

## 2020-12-16 DIAGNOSIS — E785 Hyperlipidemia, unspecified: Secondary | ICD-10-CM

## 2021-01-14 ENCOUNTER — Other Ambulatory Visit: Payer: Medicare Other

## 2021-02-01 ENCOUNTER — Other Ambulatory Visit: Payer: Self-pay | Admitting: Family Medicine

## 2021-02-01 DIAGNOSIS — I10 Essential (primary) hypertension: Secondary | ICD-10-CM

## 2021-03-02 DIAGNOSIS — E785 Hyperlipidemia, unspecified: Secondary | ICD-10-CM | POA: Diagnosis not present

## 2021-03-03 ENCOUNTER — Telehealth: Payer: Self-pay

## 2021-03-03 LAB — HEPATIC FUNCTION PANEL
ALT: 12 IU/L (ref 0–32)
AST: 17 IU/L (ref 0–40)
Albumin: 4.5 g/dL (ref 3.8–4.8)
Alkaline Phosphatase: 79 IU/L (ref 44–121)
Bilirubin Total: 0.6 mg/dL (ref 0.0–1.2)
Bilirubin, Direct: 0.17 mg/dL (ref 0.00–0.40)
Total Protein: 7.2 g/dL (ref 6.0–8.5)

## 2021-03-03 LAB — LIPID PANEL
Chol/HDL Ratio: 5.1 ratio — ABNORMAL HIGH (ref 0.0–4.4)
Cholesterol, Total: 267 mg/dL — ABNORMAL HIGH (ref 100–199)
HDL: 52 mg/dL (ref >39)
LDL Chol Calc (NIH): 199 mg/dL — ABNORMAL HIGH (ref 0–99)
Triglycerides: 95 mg/dL (ref 0–149)
VLDL Cholesterol Cal: 16 mg/dL (ref 5–40)

## 2021-03-03 NOTE — Telephone Encounter (Signed)
-----   Message from Park Liter, MD sent at 03/03/2021  2:23 PM EDT ----- Health cholesterol still poorly controlled.  Her LDL is 199!Nancy Christensen  Please make sure she takes her medication if she does we need to go up with Crestor to 40 mg daily, she is to have a fasting lipid profile, AST LT done within the next 6 weeks

## 2021-03-03 NOTE — Telephone Encounter (Signed)
Spoke with the patient she confirms she is only taking mg of Crestor but it's due to severe muscle cramps. Patient asked if her med can change. Message forward to Dr. Raliegh Ip.

## 2021-03-09 ENCOUNTER — Ambulatory Visit: Payer: Medicare Other | Admitting: Podiatry

## 2021-03-09 ENCOUNTER — Other Ambulatory Visit: Payer: Self-pay

## 2021-03-09 ENCOUNTER — Ambulatory Visit (INDEPENDENT_AMBULATORY_CARE_PROVIDER_SITE_OTHER): Payer: Medicare Other | Admitting: Cardiology

## 2021-03-09 ENCOUNTER — Encounter: Payer: Self-pay | Admitting: Cardiology

## 2021-03-09 ENCOUNTER — Other Ambulatory Visit: Payer: Self-pay | Admitting: Cardiology

## 2021-03-09 VITALS — BP 160/92 | HR 56 | Ht 64.5 in | Wt 199.0 lb

## 2021-03-09 DIAGNOSIS — R739 Hyperglycemia, unspecified: Secondary | ICD-10-CM | POA: Diagnosis not present

## 2021-03-09 DIAGNOSIS — R3589 Other polyuria: Secondary | ICD-10-CM | POA: Diagnosis not present

## 2021-03-09 DIAGNOSIS — R001 Bradycardia, unspecified: Secondary | ICD-10-CM

## 2021-03-09 DIAGNOSIS — R0789 Other chest pain: Secondary | ICD-10-CM | POA: Diagnosis not present

## 2021-03-09 DIAGNOSIS — E785 Hyperlipidemia, unspecified: Secondary | ICD-10-CM

## 2021-03-09 DIAGNOSIS — I10 Essential (primary) hypertension: Secondary | ICD-10-CM | POA: Diagnosis not present

## 2021-03-09 DIAGNOSIS — R631 Polydipsia: Secondary | ICD-10-CM | POA: Diagnosis not present

## 2021-03-09 DIAGNOSIS — Z131 Encounter for screening for diabetes mellitus: Secondary | ICD-10-CM | POA: Diagnosis not present

## 2021-03-09 MED ORDER — EZETIMIBE 10 MG PO TABS: 10.0000 mg | ORAL_TABLET | Freq: Every day | ORAL | 1 refills | Status: DC

## 2021-03-09 NOTE — Addendum Note (Signed)
Addended by: Senaida Ores on: 03/09/2021 02:28 PM   Modules accepted: Orders

## 2021-03-09 NOTE — Patient Instructions (Signed)
Medication Instructions:  Your physician has recommended you make the following change in your medication:  START: Zetia 10 mg daily   *If you need a refill on your cardiac medications before your next appointment, please call your pharmacy*   Lab Work: Your physician recommends that you return for lab work in 6 weeks: lipid  If you have labs (blood work) drawn today and your tests are completely normal, you will receive your results only by: [facility] (if you have MyChart) OR A paper copy in the mail If you have any lab test that is abnormal or we need to change your treatment, we will call you to review the results.   Testing/Procedures: None   Follow-Up: At [facility], you and your health needs are our priority.  As part of our continuing mission to provide you with exceptional heart care, we have created designated Provider Care Teams.  These Care Teams include your primary Cardiologist (physician) and Advanced Practice Providers (APPs -  Physician Assistants and Nurse Practitioners) who all work together to provide you with the care you need, when you need it.  We recommend signing up for the patient portal called "MyChart".  Sign up information is provided on this After Visit Summary.  MyChart is used to connect with patients for Virtual Visits (Telemedicine).  Patients are able to view lab/test results, encounter notes, upcoming appointments, etc.  Non-urgent messages can be sent to your provider as well.   To learn more about what you can do with MyChart, go to [email or web address].    Your next appointment:   6 month(s)  The format for your next appointment:   In Person  Provider:   Jenne Campus, MD   Other Instructions Ezetimibe Tablets What is this medication? EZETIMIBE (ez ET i mibe) treats high cholesterol. It works by reducing the amount of cholesterol absorbed from the food you eat. This decreases the amount of bad cholesterol (such as LDL) in  your blood. Changes to diet and exerciseare often combined with this medication. This medicine may be used for other purposes; ask your health care provider orpharmacist if you have questions. COMMON BRAND NAME(S): Zetia What should I tell my care team before I take this medication? They need to know if you have any of these conditions: Kidney disease Liver disease Muscle cramps, pain Muscle injury Thyroid disease An unusual or allergic reaction to ezetimibe, other medications, foods, dyes, or preservatives Pregnant or trying to get pregnant Breast-feeding How should I use this medication? Take this medication by mouth. Take it as directed on the prescription label at the same time every day. You can take it with or without food. If it upsets your stomach, take it with food. Keep taking it unless your care team tells youto stop. Take bile acid sequestrants at a different time of day than this medication.Take this medication 2 hours BEFORE or 4 hours AFTER bile acid sequestrants. Talk to your care team about the use of this medication in children. While it may be prescribed for children as young as 10 for selected conditions,precautions do apply. Overdosage: If you think you have taken too much of this medicine contact apoison control center or emergency room at once. NOTE: This medicine is only for you. Do not share this medicine with others. What if I miss a dose? If you miss a dose, take it as soon as you can. If it is almost time for yournext dose, take only that dose. Do not take double  or extra doses. What may interact with this medication? Do not take this medication with any of the following: Fenofibrate Gemfibrozil This medication may also interact with the following: Antacids Cyclosporine Herbal medications like red yeast rice Other medications to lower cholesterol or triglycerides This list may not describe all possible interactions. Give your health care provider a list of  all the medicines, herbs, non-prescription drugs, or dietary supplements you use. Also tell them if you smoke, drink alcohol, or use illegaldrugs. Some items may interact with your medicine. What should I watch for while using this medication? Visit your care team for regular checks on your progress. Tell your care teamif your symptoms do not start to get better or if they get worse. Your care team may tell you to stop taking this medication if you develop muscle problems. If your muscle problems do not go away after stopping thismedication, contact your care team. Do not become pregnant while taking this medication. Women should inform their care team if they wish to become pregnant or think they might be pregnant. There is potential for serious harm to an unborn child. Talk to your care teamfor more information. Do not breast-feed an infant while taking this medication. Taking this medication is only part of a total heart healthy program. Your care team may give you a special diet to follow. Avoid alcohol. Avoid smoking. Askyour care team how much you should exercise. What side effects may I notice from receiving this medication? Side effects that you should report to your doctor or health care provider assoon as possible: Allergic reactions-skin rash, itching or hives, swelling of the face, lips, tongue, or throat Side effects that usually do not require medical attention (report to yourdoctor or health care provider if they continue or are bothersome): Diarrhea Joint pain This list may not describe all possible side effects. Call your doctor for medical advice about side effects. You may report side effects to FDA at1-800-FDA-1088. Where should I keep my medication? Keep out of the reach of children and pets. Store at room temperature between 15 and 30 degrees C (59 and 86 degrees F). Protect from moisture. Get rid of any unused medication after the expirationdate. NOTE: This sheet is a summary.  It may not cover all possible information. If you have questions about this medicine, talk to your doctor, pharmacist, orhealth care provider.  2022 Elsevier/Gold Standard (2020-10-09 12:29:13)

## 2021-03-09 NOTE — Addendum Note (Signed)
Addended by: Senaida Ores on: 03/09/2021 01:54 PM   Modules accepted: Orders

## 2021-03-09 NOTE — Progress Notes (Signed)
Cardiology Office Note:    Date:  03/09/2021   ID:  [name], DOB April 19, 1951, MRN [id number]  PCP:  Billie Ruddy, MD  Cardiologist:  Jenne Campus, MD    Referring MD: Billie Ruddy, MD   Chief Complaint  Patient presents with   Follow-up  I am doing well  History of Present Illness:    Nancy Christensen is a 70 y.o. female who was referred to [place] because of bradycardia noted on the physical exam Sorell is some first-degree AV block.  After that she wore a monitor which showed average heart rate of 51 minimum heart rate 31, but she is completely asymptomatic.  She is very active.  She walks on the regular basis and have no difficulty doing it.  Interestingly on the monitor she was also find to have nonsustained ventricular tachycardia but only 6 beats at rate of 109 and relatively narrow QRS complex.  Overall she is doing great she denies have any chest pain tightness squeezing pressure burning chest.  Last time we spoke she got some what appears to be musculoskeletal pain treated with nonsteroidal anti-inflammatory medication with good response she walks on the regular basis that she has no difficulty doing it she feels good she is doing well  Past Medical History:  Diagnosis Date   Arthritis    left hip and knee   Atypical chest pain 10/25/2018   Cholecystitis with cholelithiasis 02/11/2018   Chronic right shoulder pain 07/04/2018   Constipation due to outlet dysfunction    Dizziness 10/25/2018   Dyslipidemia 10/25/2018   Essential hypertension 04/28/2018   Changed arb to CCB 04/24/2018 due to cough x one month trial    Hyperlipidemia    Hypertension    Hypertensive disorder 04/18/2018   Multiple pulmonary nodules determined by computed tomography of lung 01/26/2018   Passive smoke exp/ remote  Chest CT  09/24/17 MPN's  Largest 4 x 6 mm R laterally  > rec f/u 09/14/18 (reminder file)   As pt reports neg CT 2015 (not avail)  - ESR 01/25/2018 = 35  - CT chest 07/11/18 =  5  mm > no f/u needed    Primary osteoarthritis of left hip 11/26/2019   Primary osteoarthritis of left knee 03/11/2020   Rectal pain    Sinus bradycardia 10/25/2018   Unilateral primary osteoarthritis, left knee 06/14/2018   Upper airway cough syndrome 01/25/2018   Onset 1980 CT head 12/22/17 neg sinus dz FENO 01/25/2018  =   21 - Allergy profile 01/25/2018 >  Eos 0.1 /  IgE  17 RAST neg  - flare 02/12/18 p et  - gabapentin 100 mg tid 03/23/2018 > increased to max of 300 tid 04/24/2018 > improved so increased to 300 qid 06/05/2018 > d/c'd around 02/2019 s flare     Past Surgical History:  Procedure Laterality Date   ABDOMINAL HYSTERECTOMY  1997   ANAL RECTAL MANOMETRY N/A 08/09/2019   Procedure: ANO RECTAL MANOMETRY;  Surgeon: Thornton Park, MD;  Location: [facility];  Service: Gastroenterology;  Laterality: N/A;   BREAST SURGERY  2002   left cyst removal    CHOLECYSTECTOMY N/A 02/12/2018   Procedure: LAPAROSCOPIC CHOLECYSTECTOMY;  Surgeon: Leighton Ruff, MD;  Location: [facility];  Service: General;  Laterality: N/A;   COLONOSCOPY     POLYPECTOMY      Current Medications: Current Meds  Medication Sig   BILE SALTS-CAPS-CASC-PHENOLPTH PO Take by mouth 3 (three) times daily. Once after each  meal    cholecalciferol (VITAMIN D3) 25 MCG (1000 UT) tablet Take 1,000 Units by mouth daily.   diclofenac sodium (VOLTAREN) 1 % GEL APPLY 2 G TOPICALLY 4 (FOUR) TIMES DAILY AS NEEDED FOR KNEE PAIN   irbesartan (AVAPRO) 75 MG tablet TAKE 1/2 TABLETS BY MOUTH DAILY.   rosuvastatin (CRESTOR) 10 MG tablet Take 5 mg by mouth daily.   vitamin B-12 (CYANOCOBALAMIN) 1000 MCG tablet Take 1,000 mcg by mouth daily.   vitamin C (ASCORBIC ACID) 500 MG tablet Take 500 mg by mouth daily.     Allergies:   Norvasc [amlodipine besylate]   Social History   Socioeconomic History   Marital status: Divorced    Spouse name: Not on file   Number of children: Not on file   Years of education: Not on file   Highest education  level: Not on file  Occupational History   Not on file  Tobacco Use   Smoking status: Never   Smokeless tobacco: Never  Vaping Use   Vaping Use: Never used  Substance and Sexual Activity   Alcohol use: Never   Drug use: Not Currently   Sexual activity: Not on file  Other Topics Concern   Not on file  Social History Narrative   Not on file   Social Determinants of Health   Financial Resource Strain: Low Risk    Difficulty of Paying Living Expenses: Not hard at all  Food Insecurity: No Food Insecurity   Worried About Charity fundraiser in the Last Year: Never true   [place] in the Last Year: Never true  Transportation Needs: No Transportation Needs   Lack of Transportation (Medical): No   Lack of Transportation (Non-Medical): No  Physical Activity: Sufficiently Active   Days of Exercise per Week: 7 days   Minutes of Exercise per Session: 30 min  Stress: No Stress Concern Present   Feeling of Stress : Not at all  Social Connections: Moderately Integrated   Frequency of Communication with Friends and Family: More than three times a week   Frequency of Social Gatherings with Friends and Family: Once a week   Attends Religious Services: More than 4 times per year   Active Member of [employer] or Organizations: Yes   Attends Music therapist: More than 4 times per year   Marital Status: Divorced     Family History: The patient's family history includes Colon cancer in her maternal uncle; Diabetes in her mother; Hypertension in her mother; Lung cancer in her father; Thyroid disease in her mother; Vascular Disease in her mother. There is no history of Colon polyps, Esophageal cancer, Stomach cancer, Rectal cancer, or Breast cancer. ROS:   Please see the history of present illness.    All 14 point review of systems negative except as described per history of present illness  EKGs/Labs/Other Studies Reviewed:      Recent Labs: 03/02/2021: ALT 12  Recent Lipid  Panel    Component Value Date/Time   CHOL 267 (H) 03/02/2021 1024   TRIG 95 03/02/2021 1024   HDL 52 03/02/2021 1024   CHOLHDL 5.1 (H) 03/02/2021 1024   CHOLHDL 3.9 04/03/2020 1115   VLDL 26.6 06/19/2019 0849   LDLCALC 199 (H) 03/02/2021 1024   LDLCALC 140 (H) 04/03/2020 1115    Physical Exam:    VS:  BP (!) 160/92 (BP Location: Right Arm, Patient Position: Sitting, Cuff Size: Normal)   Pulse (!) 56   Ht 5'  4.5" (1.638 m)   Wt 199 lb (90.3 kg)   SpO2 99%   BMI 33.63 kg/m     Wt Readings from Last 3 Encounters:  03/09/21 199 lb (90.3 kg)  10/08/20 205 lb (93 kg)  05/12/20 210 lb (95.3 kg)     GEN:  Well nourished, well developed in no acute distress HEENT: Normal NECK: No JVD; No carotid bruits LYMPHATICS: No lymphadenopathy CARDIAC: RRR, no murmurs, no rubs, no gallops RESPIRATORY:  Clear to auscultation without rales, wheezing or rhonchi  ABDOMEN: Soft, non-tender, non-distended MUSCULOSKELETAL:  No edema; No deformity  SKIN: Warm and dry LOWER EXTREMITIES: no swelling NEUROLOGIC:  Alert and oriented x 3 PSYCHIATRIC:  Normal affect   ASSESSMENT:    1. Sinus bradycardia   2. Essential hypertension   3. Dyslipidemia   4. Atypical chest pain    PLAN:    In order of problems listed above:  Sinus bradycardia no dizziness no passing out overall doing well.  Monitor reviewed.  Average heart rate in 24 hours 51, however patient is completely asymptomatic. Essential hypertension.  Blood pressure high in the office but she tells me it always when she is in my office her blood pressure is elevated she showed me her blood pressure results from home 124/70 that is the usual blood pressure.  Will not change any medications.  She also describes situation but the one time she was giving some extra medication for blood pressure and she could not function with it I would suspect blood pressure was too low. Dyslipidemia uncontrolled I did review her recent fasting lipid profile  with LDL of 199 HDL 52.  She is taking Crestor every other day she does have some muscle aches with it.  I will add Zetia 10 mg daily to her medical regimen, will repeat her fasting lipid profile in 6 weeks. Atypical chest pain: Completely gone we will continue present management. Nonsustained ventricular tachycardia only 6 beats for narrow complex at rate of 109.  He does not have any dizziness no passing out, preserved left ventricle ejection fraction on stress echocardiogram done in 2020.  She is completely asymptomatic with aggressive exercises.  We will continue monitoring.  I asked her to let me know if she gets dizzy or pass out   Medication Adjustments/Labs and Tests Ordered: Current medicines are reviewed at length with the patient today.  Concerns regarding medicines are outlined above.  No orders of the defined types were placed in this encounter.  Medication changes: No orders of the defined types were placed in this encounter.   Signed, Park Liter, MD, [facility] 03/09/2021 1:48 PM     [facility]

## 2021-03-10 LAB — HEMOGLOBIN A1C
Est. average glucose Bld gHb Est-mCnc: 114 mg/dL
Hgb A1c MFr Bld: 5.6 % (ref 4.8–5.6)

## 2021-03-16 ENCOUNTER — Ambulatory Visit (INDEPENDENT_AMBULATORY_CARE_PROVIDER_SITE_OTHER): Payer: Medicare Other | Admitting: Podiatry

## 2021-03-16 ENCOUNTER — Other Ambulatory Visit: Payer: Self-pay

## 2021-03-16 ENCOUNTER — Ambulatory Visit: Payer: Medicare Other

## 2021-03-16 DIAGNOSIS — L600 Ingrowing nail: Secondary | ICD-10-CM | POA: Diagnosis not present

## 2021-03-16 DIAGNOSIS — M79674 Pain in right toe(s): Secondary | ICD-10-CM

## 2021-03-16 NOTE — Addendum Note (Signed)
Addended by: Judy Pimple D on: 03/16/2021 01:29 PM   Modules accepted: Orders

## 2021-03-16 NOTE — Progress Notes (Signed)
  Subjective:  Patient ID: Nancy Christensen, female    DOB: 1950-12-17,  MRN: [id number]  Chief Complaint  Patient presents with   Foot Problem    Right lateral hallux pains/discoloration. Ingrown nails 04/2019.    70 y.o. female presents with the above complaint. History confirmed with patient.   Objective:  Physical Exam: warm, good capillary refill, no trophic changes or ulcerative lesions, normal DP and PT pulses, and normal sensory exam.  Painful ingrowing nail at lateral border of the right, hallux  Assessment:   1. Ingrown nail    Plan:  Patient was evaluated and treated and all questions answered.  Ingrown Nail, right -Palliative debridement of nail fragments in the lateral border without true ingrown nail. Should issues persist consider further nail excision but I think we can hold off for now.  No follow-ups on file.

## 2021-03-17 ENCOUNTER — Telehealth: Payer: Self-pay

## 2021-03-17 NOTE — Telephone Encounter (Signed)
-----   Message from Park Liter, MD sent at 03/03/2021  2:23 PM EDT ----- Health cholesterol still poorly controlled.  Her LDL is 199!Nancy Christensen  Please make sure she takes her medication if she does we need to go up with Crestor to 40 mg daily, she is to have a fasting lipid profile, AST LT done within the next 6 weeks

## 2021-03-17 NOTE — Telephone Encounter (Signed)
Spoke with patient, she states on last visit Dr. Agustin Cree started her on Zetia which she has pick up yesterday and will start this. There is an active lipid order on file from 03/09/2021.

## 2021-04-06 DIAGNOSIS — M6289 Other specified disorders of muscle: Secondary | ICD-10-CM | POA: Diagnosis not present

## 2021-04-09 DIAGNOSIS — Z20822 Contact with and (suspected) exposure to covid-19: Secondary | ICD-10-CM | POA: Diagnosis not present

## 2021-04-29 ENCOUNTER — Other Ambulatory Visit: Payer: Self-pay

## 2021-04-29 ENCOUNTER — Ambulatory Visit
Admission: RE | Admit: 2021-04-29 | Discharge: 2021-04-29 | Disposition: A | Discharge status: Home / Self Care | Payer: Medicare Other | Source: Ambulatory Visit | Attending: Family Medicine | Admitting: Family Medicine

## 2021-04-29 DIAGNOSIS — E2839 Other primary ovarian failure: Secondary | ICD-10-CM

## 2021-04-29 DIAGNOSIS — Z780 Asymptomatic menopausal state: Secondary | ICD-10-CM | POA: Diagnosis not present

## 2021-05-25 ENCOUNTER — Ambulatory Visit (INDEPENDENT_AMBULATORY_CARE_PROVIDER_SITE_OTHER): Payer: Medicare Other | Admitting: Podiatry

## 2021-05-25 ENCOUNTER — Telehealth: Payer: Self-pay | Admitting: Podiatry

## 2021-05-25 ENCOUNTER — Other Ambulatory Visit: Payer: Self-pay

## 2021-05-25 DIAGNOSIS — L600 Ingrowing nail: Secondary | ICD-10-CM | POA: Diagnosis not present

## 2021-05-25 NOTE — Progress Notes (Signed)
  Subjective:  Patient ID: Nancy Christensen, female    DOB: 14-Sep-1951,  MRN: [id number]  Chief Complaint  Patient presents with   Toe Pain    Follow up right hallux pain. Pt states she is still having throbbing shooting pains in entire toe.    70 y.o. female presents with the above complaint. History confirmed with patient.   Objective:  Physical Exam: warm, good capillary refill, no trophic changes or ulcerative lesions, normal DP and PT pulses, and normal sensory exam.  Pain to palpation lateral border of the right, hallux with small area of lysis no large aspect of ingrowing nail noted.  Assessment:   1. Ingrown nail     Plan:  Patient was evaluated and treated and all questions answered.  Ingrown Nail, right -Debrided in slant-back fashion again to patient relief, advised that there does not appear to be a significant portion of ingrown nail; there is a small area of lysis. Should pain persist would consider further partial nail excision as previously discussed.  No follow-ups on file.

## 2021-05-25 NOTE — Telephone Encounter (Signed)
I can do 130 - there was a cancellation

## 2021-05-25 NOTE — Telephone Encounter (Signed)
Patient called and stated she is having severe pain where the ingrown was done and wanted to know if she can be worked in today. She stated Friday was to long.

## 2021-07-05 ENCOUNTER — Other Ambulatory Visit: Payer: Self-pay | Admitting: Obstetrics & Gynecology

## 2021-07-08 ENCOUNTER — Other Ambulatory Visit: Payer: Self-pay

## 2021-07-09 ENCOUNTER — Encounter: Payer: Self-pay | Admitting: Family Medicine

## 2021-07-09 ENCOUNTER — Ambulatory Visit (INDEPENDENT_AMBULATORY_CARE_PROVIDER_SITE_OTHER): Payer: Medicare Other | Admitting: Family Medicine

## 2021-07-09 VITALS — BP 134/82 | HR 72 | Temp 98.9°F | Wt 201.6 lb

## 2021-07-09 DIAGNOSIS — N6012 Diffuse cystic mastopathy of left breast: Secondary | ICD-10-CM

## 2021-07-09 DIAGNOSIS — M79605 Pain in left leg: Secondary | ICD-10-CM | POA: Diagnosis not present

## 2021-07-09 DIAGNOSIS — N644 Mastodynia: Secondary | ICD-10-CM | POA: Diagnosis not present

## 2021-07-09 NOTE — Progress Notes (Signed)
Subjective:    Patient ID: Nancy Christensen, female    DOB: 01-Dec-1950, 70 y.o.   MRN: [id number]  Chief Complaint  Patient presents with   Breast Pain    Benign cyst 20 years ago. Left breast is painful and tender, has been ongoing.    HPI Patient was seen today for ongoing concern.  Pt with a h/o fibrodense breast tissue.  Pt endorses continuous L breast pain x a month.  Denies any erythema, edema,  masses, nipple d/c.  Had a biopsy of L breast several yrs ago.  At times has pain at biopsy site due to scar tissue.  No fam h/o breast ca.   Past Medical History:  Diagnosis Date   Arthritis    left hip and knee   Atypical chest pain 10/25/2018   Cholecystitis with cholelithiasis 02/11/2018   Chronic right shoulder pain 07/04/2018   Constipation due to outlet dysfunction    Dizziness 10/25/2018   Dyslipidemia 10/25/2018   Essential hypertension 04/28/2018   Changed arb to CCB 04/24/2018 due to cough x one month trial    Hyperlipidemia    Hypertension    Hypertensive disorder 04/18/2018   Multiple pulmonary nodules determined by computed tomography of lung 01/26/2018   Passive smoke exp/ remote  Chest CT  09/24/17 MPN's  Largest 4 x 6 mm R laterally  > rec f/u 09/14/18 (reminder file)   As pt reports neg CT 2015 (not avail)  - ESR 01/25/2018 = 35  - CT chest 07/11/18 =  5 mm > no f/u needed    Primary osteoarthritis of left hip 11/26/2019   Primary osteoarthritis of left knee 03/11/2020   Rectal pain    Sinus bradycardia 10/25/2018   Unilateral primary osteoarthritis, left knee 06/14/2018   Upper airway cough syndrome 01/25/2018   Onset 1980 CT head 12/22/17 neg sinus dz FENO 01/25/2018  =   21 - Allergy profile 01/25/2018 >  Eos 0.1 /  IgE  17 RAST neg  - flare 02/12/18 p et  - gabapentin 100 mg tid 03/23/2018 > increased to max of 300 tid 04/24/2018 > improved so increased to 300 qid 06/05/2018 > d/c'd around 02/2019 s flare     Allergies  Allergen Reactions   Norvasc [Amlodipine Besylate]     edema     ROS General: Denies fever, chills, night sweats, changes in weight, changes in appetite HEENT: Denies headaches, ear pain, changes in vision, rhinorrhea, sore throat CV: Denies CP, palpitations, SOB, orthopnea Pulm: Denies SOB, cough, wheezing GI: Denies abdominal pain, nausea, vomiting, diarrhea, constipation GU: Denies dysuria, hematuria, frequency, vaginal discharge +L breast pain Msk: Denies muscle cramps, joint pains Neuro: Denies weakness, numbness, tingling Skin: Denies rashes, bruising Psych: Denies depression, anxiety, hallucinations     Objective:    Blood pressure 134/82, pulse 72, temperature 98.9 F (37.2 C), temperature source Oral, weight 201 lb 9.6 oz (91.4 kg), SpO2 97 %.  Gen. Pleasant, well-nourished, in no distress, normal affect   HEENT: [place]/AT, face symmetric, conjunctiva clear, no scleral icterus, PERRLA, EOMI, nares patent without drainage Lungs: no accessory muscle use Cardiovascular: RRR, no m/r/g, no peripheral edema GU: Normal-appearing breast bilaterally.  Accessory breast tissue in right axilla TTP.  Scattered cystic like masses in bilateral breast, tender to deep palpation.  Left breast with well-healed surgical incision and TTP throughout.  RLQ of L breast at inferior edge/breast fold with 8 mm cystic mass TTP.  L breast LUQ 12 o'clock position just superior  to areola with cystic mass. Musculoskeletal: No deformities, no cyanosis or clubbing, normal tone Neuro:  A&Ox3, CN II-XII intact, normal gait Skin:  Warm, no lesions/ rash   Wt Readings from Last 3 Encounters:  07/09/21 201 lb 9.6 oz (91.4 kg)  03/09/21 199 lb (90.3 kg)  10/08/20 205 lb (93 kg)    Lab Results  Component Value Date   WBC 7.5 06/19/2019   HGB 14.2 06/19/2019   HCT 42.2 06/19/2019   PLT 208.0 06/19/2019   GLUCOSE 94 06/19/2019   CHOL 267 (H) 03/02/2021   TRIG 95 03/02/2021   HDL 52 03/02/2021   LDLCALC 199 (H) 03/02/2021   ALT 12 03/02/2021   AST 17 03/02/2021   NA  137 06/19/2019   K 3.8 06/19/2019   CL 100 06/19/2019   CREATININE 1.15 06/19/2019   BUN 13 06/19/2019   CO2 28 06/19/2019   HGBA1C 5.6 03/09/2021    Assessment/Plan:  Breast pain, left - Plan: MM Digital Diagnostic Bilat  Fibrocystic changes of left breast - Plan: MM Digital Diagnostic Bilat  Given history of fibroid dense breast tissue with previous biopsy and current pain orders placed for diagnostic mammogram.  Discussed supportive care.  Monthly self breast exams encouraged.  NSAIDs or Tylenol as needed for pain/discomfort.  Given precautions.  Given handout.  F/u prn  Grier Mitts, MD

## 2021-07-19 ENCOUNTER — Other Ambulatory Visit: Payer: Self-pay | Admitting: Family Medicine

## 2021-07-19 DIAGNOSIS — N644 Mastodynia: Secondary | ICD-10-CM

## 2021-07-19 DIAGNOSIS — N6012 Diffuse cystic mastopathy of left breast: Secondary | ICD-10-CM

## 2021-07-30 DIAGNOSIS — H40013 Open angle with borderline findings, low risk, bilateral: Secondary | ICD-10-CM | POA: Diagnosis not present

## 2021-07-31 ENCOUNTER — Other Ambulatory Visit: Payer: Self-pay | Admitting: Cardiology

## 2021-08-09 ENCOUNTER — Telehealth (INDEPENDENT_AMBULATORY_CARE_PROVIDER_SITE_OTHER): Payer: Medicare Other | Admitting: Family Medicine

## 2021-08-09 ENCOUNTER — Encounter: Payer: Self-pay | Admitting: Family Medicine

## 2021-08-09 VITALS — BP 117/72 | HR 56 | Ht 64.5 in

## 2021-08-09 DIAGNOSIS — M545 Low back pain, unspecified: Secondary | ICD-10-CM | POA: Diagnosis not present

## 2021-08-09 DIAGNOSIS — R0989 Other specified symptoms and signs involving the circulatory and respiratory systems: Secondary | ICD-10-CM | POA: Diagnosis not present

## 2021-08-09 DIAGNOSIS — G8929 Other chronic pain: Secondary | ICD-10-CM

## 2021-08-09 DIAGNOSIS — R059 Cough, unspecified: Secondary | ICD-10-CM

## 2021-08-09 MED ORDER — TIZANIDINE HCL 4 MG PO TABS: 4.0000 mg | ORAL_TABLET | Freq: Four times a day (QID) | ORAL | 0 refills | Status: DC | PRN

## 2021-08-09 MED ORDER — BENZONATATE 100 MG PO CAPS: 200.0000 mg | ORAL_CAPSULE | Freq: Two times a day (BID) | ORAL | 0 refills | Status: AC | PRN

## 2021-08-09 MED ORDER — TRAMADOL HCL 50 MG PO TABS: 25.0000 mg | ORAL_TABLET | Freq: Two times a day (BID) | ORAL | 0 refills | Status: AC | PRN

## 2021-08-09 NOTE — Progress Notes (Signed)
Virtual Visit via Video Note I connected with Nancy Christensen on 08/09/21 by a video enabled telemedicine application and verified that I am speaking with the correct person using two identifiers.  Location patient: home Location provider:work office Persons participating in the virtual visit: patient, provider  I discussed the limitations of evaluation and management by telemedicine and the availability of in person appointments. The patient expressed understanding and agreed to proceed.  Chief Complaint  Patient presents with   Back Pain   HPI: Nancy Christensen is a 70 year old female with history of OA, hypertension, and hyperlipidemia complaining of 4 days of right-sided back pain. Pain is not radiated, achy-like pain, 8/10, constant. Spasm like sensation, "fluttery" on lower back,bilateral. She has not noted skin changes on affected area.  Negative for saddle anesthesia, lower extremity numbness/tingling, or bowel/bladder dysfunction. She is concerned about possible kidney problem causing pain.  Denies dysuria, urinary frequency, gross hematuria, foamy urine, or decreased urine output.  Exacerbated by movement and alleviated by rest. She has not taking OTC analgesics. She has had intermittent lower back pain for years. No history of trauma or unusual physical activity. Lumbar x-ray on 10/15/19 showed diffuse multilevel degenerative change.  No acute abnormality.  Lab Results  Component Value Date   CREATININE 1.15 06/19/2019   BUN 13 06/19/2019   NA 137 06/19/2019   K 3.8 06/19/2019   CL 100 06/19/2019   CO2 28 06/19/2019   She is also having upper respiratory symptoms that is started 5 days ago. She has not had fever, she has chills x1. Nasal congestion, rhinorrhea, and postnasal drainage. Productive cough with clearish sputum. She is taking OTC Coricidin. Negative for CP, dyspnea, wheezing, abdominal pain, changes in bowel habits, N/V, or skin rash. Negative for sick  contact. She has had 2 COVID-19 test done at home, 4 days ago and today, both negative.  ROS: See pertinent positives and negatives per HPI.  Past Medical History:  Diagnosis Date   Arthritis    left hip and knee   Atypical chest pain 10/25/2018   Cholecystitis with cholelithiasis 02/11/2018   Chronic right shoulder pain 07/04/2018   Constipation due to outlet dysfunction    Dizziness 10/25/2018   Dyslipidemia 10/25/2018   Essential hypertension 04/28/2018   Changed arb to CCB 04/24/2018 due to cough x one month trial    Hyperlipidemia    Hypertension    Hypertensive disorder 04/18/2018   Multiple pulmonary nodules determined by computed tomography of lung 01/26/2018   Passive smoke exp/ remote  Chest CT  09/24/17 MPN's  Largest 4 x 6 mm R laterally  > rec f/u 09/14/18 (reminder file)   As pt reports neg CT 2015 (not avail)  - ESR 01/25/2018 = 35  - CT chest 07/11/18 =  5 mm > no f/u needed    Primary osteoarthritis of left hip 11/26/2019   Primary osteoarthritis of left knee 03/11/2020   Rectal pain    Sinus bradycardia 10/25/2018   Unilateral primary osteoarthritis, left knee 06/14/2018   Upper airway cough syndrome 01/25/2018   Onset 1980 CT head 12/22/17 neg sinus dz FENO 01/25/2018  =   21 - Allergy profile 01/25/2018 >  Eos 0.1 /  IgE  17 RAST neg  - flare 02/12/18 p et  - gabapentin 100 mg tid 03/23/2018 > increased to max of 300 tid 04/24/2018 > improved so increased to 300 qid 06/05/2018 > d/c'd around 02/2019 s flare     Past Surgical History:  Procedure Laterality Date   ABDOMINAL HYSTERECTOMY  1997   ANAL RECTAL MANOMETRY N/A 08/09/2019   Procedure: ANO RECTAL MANOMETRY;  Surgeon: Thornton Park, MD;  Location: [facility];  Service: Gastroenterology;  Laterality: N/A;   BREAST SURGERY  2002   left cyst removal    CHOLECYSTECTOMY N/A 02/12/2018   Procedure: LAPAROSCOPIC CHOLECYSTECTOMY;  Surgeon: Leighton Ruff, MD;  Location: [facility];  Service: General;  Laterality: N/A;   COLONOSCOPY      POLYPECTOMY      Family History  Problem Relation Age of Onset   Diabetes Mother    Hypertension Mother    Thyroid disease Mother    Vascular Disease Mother    Lung cancer Father    Colon cancer Maternal Uncle    Colon polyps Neg Hx    Esophageal cancer Neg Hx    Stomach cancer Neg Hx    Rectal cancer Neg Hx    Breast cancer Neg Hx     Social History   Socioeconomic History   Marital status: Divorced    Spouse name: Not on file   Number of children: Not on file   Years of education: Not on file   Highest education level: Not on file  Occupational History   Not on file  Tobacco Use   Smoking status: Never   Smokeless tobacco: Never  Vaping Use   Vaping Use: Never used  Substance and Sexual Activity   Alcohol use: Never   Drug use: Not Currently   Sexual activity: Not on file  Other Topics Concern   Not on file  Social History Narrative   Not on file   Social Determinants of Health   Financial Resource Strain: Not on file  Food Insecurity: Not on file  Transportation Needs: Not on file  Physical Activity: Not on file  Stress: Not on file  Social Connections: Not on file  Intimate Partner Violence: Not on file    Current Outpatient Medications:    BILE SALTS-CAPS-CASC-PHENOLPTH PO, Take by mouth 3 (three) times daily. Once after each meal , Disp: , Rfl:    cholecalciferol (VITAMIN D3) 25 MCG (1000 UT) tablet, Take 1,000 Units by mouth daily., Disp: , Rfl:    diclofenac sodium (VOLTAREN) 1 % GEL, APPLY 2 G TOPICALLY 4 (FOUR) TIMES DAILY AS NEEDED FOR KNEE PAIN, Disp: , Rfl:    ezetimibe (ZETIA) 10 MG tablet, TAKE 1 TABLET BY MOUTH EVERY DAY, Disp: 90 tablet, Rfl: 1   irbesartan (AVAPRO) 75 MG tablet, TAKE 1/2 TABLETS BY MOUTH DAILY., Disp: 30 tablet, Rfl: 2   vitamin B-12 (CYANOCOBALAMIN) 1000 MCG tablet, Take 1,000 mcg by mouth daily., Disp: , Rfl:    vitamin C (ASCORBIC ACID) 500 MG tablet, Take 500 mg by mouth daily., Disp: , Rfl:   EXAM:  VITALS per  patient if applicable:BP 323/55   Pulse (!) 56   Ht 5' 4.5" (1.638 m)   BMI 34.07 kg/m   GENERAL: alert, oriented, appears well and in no acute distress  HEENT: atraumatic, conjunctiva clear, no obvious abnormalities on inspection of external nose and ears  NECK: normal movements of the head and neck  LUNGS: on inspection no signs of respiratory distress, breathing rate appears normal, no obvious gross SOB, gasping or wheezing. Productive cough x 2 during visit.  CV: no obvious cyanosis  MS: moves all visible extremities without noticeable abnormality  PSYCH/NEURO: pleasant and cooperative, no obvious depression or anxiety, speech and thought processing grossly intact  ASSESSMENT AND PLAN:  Discussed the following assessment and plan:  Chronic right-sided low back pain without sciatica - Plan: traMADol (ULTRAM) 50 MG tablet, tiZANidine (ZANAFLEX) 4 MG tablet Chronic with acute exacerbation. Hx does not suggest a serious process. Because abnormal e GFR in 05/2019 (56.5), I do not recommend NSAID's. Tramadol and Zanaflex recommended, side effects discussed. Fall precautions. PT can be considered if pain still present in 2 weeks.  Cough, unspecified type - Plan: benzonatate (TESSALON) 100 MG capsule Benzonatate for symptomatic treatment. I do not think imaging is needed at this time. Monitor for new symptoms.  Upper respiratory symptom Mild. Differential dx's include allergies and viral URI. Negative COVID 19 test x 2. Continue symptomatic treatment. Monitor for worsening symptoms or fever. Instructed about warning signs.  We discussed possible serious and likely etiologies, options for evaluation and workup, limitations of telemedicine visit vs in person visit, treatment, treatment risks and precautions.  I discussed the assessment and treatment plan with the patient. Nancy Christensen was provided an opportunity to ask questions and all were answered. The patient agreed with the  plan and demonstrated an understanding of the instructions.  Return if symptoms worsen or fail to improve. Beverly Suriano G. Martin[place], MD  [facility]. [place] office.

## 2021-08-25 ENCOUNTER — Other Ambulatory Visit: Payer: Self-pay

## 2021-08-25 ENCOUNTER — Ambulatory Visit: Payer: Medicare Other

## 2021-08-25 ENCOUNTER — Ambulatory Visit
Admission: RE | Admit: 2021-08-25 | Discharge: 2021-08-25 | Disposition: A | Discharge status: Home / Self Care | Payer: Medicare Other | Source: Ambulatory Visit | Attending: Family Medicine | Admitting: Family Medicine

## 2021-08-25 DIAGNOSIS — N644 Mastodynia: Secondary | ICD-10-CM | POA: Diagnosis not present

## 2021-08-25 DIAGNOSIS — R922 Inconclusive mammogram: Secondary | ICD-10-CM | POA: Diagnosis not present

## 2021-08-25 DIAGNOSIS — N6012 Diffuse cystic mastopathy of left breast: Secondary | ICD-10-CM

## 2021-09-02 DIAGNOSIS — Z20822 Contact with and (suspected) exposure to covid-19: Secondary | ICD-10-CM | POA: Diagnosis not present

## 2021-09-14 ENCOUNTER — Ambulatory Visit: Payer: Medicare Other | Admitting: Cardiology

## 2021-09-21 NOTE — Progress Notes (Signed)
ACUTE VISIT Chief Complaint  Patient presents with   Back Pain    Same back pain as on 11/14.   HPI: [name] is a 70 y.o. female, who is here today complaining of 4 days of right-sided back. She was seen for acute visit on 08/09/21, virtually and for same problem. Problem has been intermittently for a couple years but having more frequent exacerbation and pain seems more intense. Spasm like pain. Constant. Now it is 5/10, it was 10/10.  Back Pain This is a recurrent problem. The problem occurs constantly. The problem has been gradually improving since onset. The quality of the pain is described as aching and cramping. The pain does not radiate. The pain is moderate. The symptoms are aggravated by bending, standing and twisting. Stiffness is present In the morning. Pertinent negatives include no abdominal pain, bladder incontinence, bowel incontinence, chest pain, fever, headaches, leg pain, numbness, paresis, paresthesias, pelvic pain, perianal numbness, tingling, weakness or weight loss.   Negative for recent injury or unusual level of activity.  Exacerbated by movement and deep breathing. Alleviated by rest.  Tramadol was recommended last visit, it did not help.  Lumbar X ray 08/2020:Diffuse multilevel degenerative change.   No acute abnormality.  HTN on Avapro 75 mg aily. She is not checking BP at home. Negative for gross hematuria,foam in urine,or decreased urine output. A few days ago she noted "strong" urine. Negative for dysuria or urinary frequency.  Lab Results  Component Value Date   CREATININE 1.15 06/19/2019   BUN 13 06/19/2019   NA 137 06/19/2019   K 3.8 06/19/2019   CL 100 06/19/2019   CO2 28 06/19/2019   Lab Results  Component Value Date   WBC 7.5 06/19/2019   HGB 14.2 06/19/2019   HCT 42.2 06/19/2019   MCV 84.7 06/19/2019   PLT 208.0 06/19/2019   Review of Systems  Constitutional:  Positive for activity change and fatigue. Negative for  appetite change, chills, fever and weight loss.  HENT:  Negative for mouth sores and sore throat.   Respiratory:  Negative for cough, shortness of breath and wheezing.   Cardiovascular:  Negative for chest pain and palpitations.  Gastrointestinal:  Negative for abdominal pain, bowel incontinence, nausea and vomiting.       Negative for changes in bowel habits.  Genitourinary:  Negative for bladder incontinence and pelvic pain.  Musculoskeletal:  Positive for back pain.  Skin:  Negative for pallor and rash.  Neurological:  Negative for tingling, syncope, weakness, numbness, headaches and paresthesias.  Rest see pertinent positives and negatives per HPI.  Current Outpatient Medications on File Prior to Visit  Medication Sig Dispense Refill   BILE SALTS-CAPS-CASC-PHENOLPTH PO Take by mouth 3 (three) times daily. Once after each meal      cholecalciferol (VITAMIN D3) 25 MCG (1000 UT) tablet Take 1,000 Units by mouth daily.     ezetimibe (ZETIA) 10 MG tablet TAKE 1 TABLET BY MOUTH EVERY DAY 90 tablet 1   irbesartan (AVAPRO) 75 MG tablet TAKE 1/2 TABLETS BY MOUTH DAILY. 30 tablet 2   No current facility-administered medications on file prior to visit.   Past Medical History:  Diagnosis Date   Arthritis    left hip and knee   Atypical chest pain 10/25/2018   Cholecystitis with cholelithiasis 02/11/2018   Chronic right shoulder pain 07/04/2018   Constipation due to outlet dysfunction    Dizziness 10/25/2018   Dyslipidemia 10/25/2018   Essential hypertension 04/28/2018  Changed arb to CCB 04/24/2018 due to cough x one month trial    Hyperlipidemia    Hypertension    Hypertensive disorder 04/18/2018   Multiple pulmonary nodules determined by computed tomography of lung 01/26/2018   Passive smoke exp/ remote  Chest CT  09/24/17 MPN's  Largest 4 x 6 mm R laterally  > rec f/u 09/14/18 (reminder file)   As pt reports neg CT 2015 (not avail)  - ESR 01/25/2018 = 35  - CT chest 07/11/18 =  5 mm > no f/u  needed    Primary osteoarthritis of left hip 11/26/2019   Primary osteoarthritis of left knee 03/11/2020   Rectal pain    Sinus bradycardia 10/25/2018   Unilateral primary osteoarthritis, left knee 06/14/2018   Upper airway cough syndrome 01/25/2018   Onset 1980 CT head 12/22/17 neg sinus dz FENO 01/25/2018  =   21 - Allergy profile 01/25/2018 >  Eos 0.1 /  IgE  17 RAST neg  - flare 02/12/18 p et  - gabapentin 100 mg tid 03/23/2018 > increased to max of 300 tid 04/24/2018 > improved so increased to 300 qid 06/05/2018 > d/c'd around 02/2019 s flare    No Known Allergies  Social History   Socioeconomic History   Marital status: Divorced    Spouse name: Not on file   Number of children: Not on file   Years of education: Not on file   Highest education level: Not on file  Occupational History   Not on file  Tobacco Use   Smoking status: Never   Smokeless tobacco: Never  Vaping Use   Vaping Use: Never used  Substance and Sexual Activity   Alcohol use: Never   Drug use: Not Currently   Sexual activity: Not on file  Other Topics Concern   Not on file  Social History Narrative   Not on file   Social Determinants of Health   Financial Resource Strain: Not on file  Food Insecurity: Not on file  Transportation Needs: Not on file  Physical Activity: Not on file  Stress: Not on file  Social Connections: Not on file   Vitals:   09/22/21 1000  BP: 128/80  Pulse: (!) 58  Resp: 16  SpO2: 98%   Body mass index is 33.63 kg/m.  Physical Exam Vitals and nursing note reviewed.  Constitutional:      General: She is not in acute distress.    Appearance: She is well-developed.  HENT:     Head: Normocephalic and atraumatic.     Mouth/Throat:     Mouth: Mucous membranes are moist.     Pharynx: Oropharynx is clear.  Eyes:     Conjunctiva/sclera: Conjunctivae normal.  Cardiovascular:     Rate and Rhythm: Regular rhythm. Bradycardia present.     Pulses:          Dorsalis pedis pulses are 2+ on  the right side and 2+ on the left side.     Heart sounds: No murmur heard.    Comments: Trace pitting LE edema, bilateral. Pulmonary:     Effort: Pulmonary effort is normal. No respiratory distress.     Breath sounds: Normal breath sounds.  Abdominal:     Palpations: Abdomen is soft. There is no hepatomegaly or mass.     Tenderness: There is no abdominal tenderness.  Musculoskeletal:     Lumbar back: Tenderness present. No bony tenderness. Decreased range of motion. Negative right straight leg raise test and negative  left straight leg raise test.       Back:     Right lower leg: No edema.     Left lower leg: No edema.  Skin:    General: Skin is warm.     Findings: No erythema or rash.  Neurological:     General: No focal deficit present.     Mental Status: She is alert and oriented to person, place, and time.     Comments: Antalgic gait, not assisted.  Psychiatric:     Comments: Well groomed, good eye contact.   ASSESSMENT AND PLAN:  [name] was seen today for back pain.  Diagnoses and all orders for this visit: Orders Placed This Encounter  Procedures   DG Lumbar Spine Complete   Basic metabolic panel   CBC   Urinalysis, Routine w reflex microscopic   Ambulatory referral to Physical Therapy   Lab Results  Component Value Date   CREATININE 1.00 09/22/2021   BUN 14 09/22/2021   NA 140 09/22/2021   K 3.8 09/22/2021   CL 103 09/22/2021   CO2 29 09/22/2021   Lab Results  Component Value Date   WBC 8.7 09/22/2021   HGB 12.9 09/22/2021   HCT 39.5 09/22/2021   MCV 86.2 09/22/2021   PLT 223.0 09/22/2021   Chronic right-sided low back pain without sciatica Acute exacerbation. Problem seems to be getting worse. Lumbar X ray ordered today. After discussion of some side effects she agrees with trying Celebrex for 7-10 days. PT will be arranged.  -     celecoxib (CELEBREX) 100 MG capsule; Take 1 capsule (100 mg total) by mouth 2 (two) times daily for 10  days.  Essential hypertension BP adequately controlled. Monitor BP regularly while on NSAID's. Continue Avapro 75 mg daily and low salt diet.  Bad odor of urine No other associated urinary symptom. Hx does not suggest UTI. Monitor for new symptoms. Recommend increasing fluid intake. Further recommendations according to UA results.  Return if symptoms worsen or fail to improve.   Drue Harr G. Martinique, MD  [facility]. [place] office.

## 2021-09-22 ENCOUNTER — Encounter: Payer: Self-pay | Admitting: Family Medicine

## 2021-09-22 ENCOUNTER — Ambulatory Visit (INDEPENDENT_AMBULATORY_CARE_PROVIDER_SITE_OTHER): Payer: Medicare Other

## 2021-09-22 ENCOUNTER — Other Ambulatory Visit: Payer: Self-pay

## 2021-09-22 ENCOUNTER — Ambulatory Visit (INDEPENDENT_AMBULATORY_CARE_PROVIDER_SITE_OTHER): Payer: Medicare Other | Admitting: Family Medicine

## 2021-09-22 VITALS — BP 128/80 | HR 58 | Resp 16 | Ht 64.5 in | Wt 199.0 lb

## 2021-09-22 DIAGNOSIS — M545 Low back pain, unspecified: Secondary | ICD-10-CM

## 2021-09-22 DIAGNOSIS — R8290 Unspecified abnormal findings in urine: Secondary | ICD-10-CM | POA: Diagnosis not present

## 2021-09-22 DIAGNOSIS — G8929 Other chronic pain: Secondary | ICD-10-CM | POA: Diagnosis not present

## 2021-09-22 DIAGNOSIS — M47816 Spondylosis without myelopathy or radiculopathy, lumbar region: Secondary | ICD-10-CM | POA: Diagnosis not present

## 2021-09-22 DIAGNOSIS — I10 Essential (primary) hypertension: Secondary | ICD-10-CM | POA: Diagnosis not present

## 2021-09-22 LAB — URINALYSIS, ROUTINE W REFLEX MICROSCOPIC
Bilirubin Urine: NEGATIVE
Hgb urine dipstick: NEGATIVE
Ketones, ur: NEGATIVE
Leukocytes,Ua: NEGATIVE
Nitrite: NEGATIVE
RBC / HPF: NONE SEEN (ref 0–?)
Specific Gravity, Urine: <=1.005 — AB (ref 1.000–1.030)
Total Protein, Urine: NEGATIVE
Urine Glucose: NEGATIVE
Urobilinogen, UA: 0.2 (ref 0.0–1.0)
WBC, UA: NONE SEEN (ref 0–?)
pH: 6.5 (ref 5.0–8.0)

## 2021-09-22 LAB — BASIC METABOLIC PANEL
BUN: 14 mg/dL (ref 6–23)
CO2: 29 mEq/L (ref 19–32)
Calcium: 10 mg/dL (ref 8.4–10.5)
Chloride: 103 mEq/L (ref 96–112)
Creatinine, Ser: 1 mg/dL (ref 0.40–1.20)
GFR: 56.73 mL/min — ABNORMAL LOW (ref >60.00)
Glucose, Bld: 77 mg/dL (ref 70–99)
Potassium: 3.8 mEq/L (ref 3.5–5.1)
Sodium: 140 mEq/L (ref 135–145)

## 2021-09-22 LAB — CBC
HCT: 39.5 % (ref 36.0–46.0)
Hemoglobin: 12.9 g/dL (ref 12.0–15.0)
MCHC: 32.8 g/dL (ref 30.0–36.0)
MCV: 86.2 fl (ref 78.0–100.0)
Platelets: 223 10*3/uL (ref 150.0–400.0)
RBC: 4.58 Mil/uL (ref 3.87–5.11)
RDW: 14.8 % (ref 11.5–15.5)
WBC: 8.7 10*3/uL (ref 4.0–10.5)

## 2021-09-22 MED ORDER — CELECOXIB 100 MG PO CAPS: 100.0000 mg | ORAL_CAPSULE | Freq: Two times a day (BID) | ORAL | 0 refills | Status: AC

## 2021-09-22 NOTE — Patient Instructions (Addendum)
A few things to remember from today's visit:  Chronic right-sided low back pain without sciatica - Plan: CBC, DG Lumbar Spine Complete, celecoxib (CELEBREX) 100 MG capsule, Ambulatory referral to Physical Therapy  Essential hypertension - Plan: Basic metabolic panel  Bad odor of urine - Plan: Urinalysis, Routine w reflex microscopic  If you need refills please call your pharmacy. Do not use My Chart to request refills or for acute issues that need immediate attention.  Continue monitoring blood pressure. Celebrex for 7-10 days. PT will be arranged.  Please be sure medication list is accurate. If a new problem present, please set up appointment sooner than planned today.

## 2021-10-05 ENCOUNTER — Encounter: Payer: Self-pay | Admitting: Physical Therapy

## 2021-10-05 ENCOUNTER — Other Ambulatory Visit: Payer: Self-pay

## 2021-10-05 ENCOUNTER — Ambulatory Visit: Payer: Medicare Other | Attending: Family Medicine | Admitting: Physical Therapy

## 2021-10-05 DIAGNOSIS — G8929 Other chronic pain: Secondary | ICD-10-CM | POA: Insufficient documentation

## 2021-10-05 DIAGNOSIS — M6281 Muscle weakness (generalized): Secondary | ICD-10-CM | POA: Insufficient documentation

## 2021-10-05 DIAGNOSIS — M545 Low back pain, unspecified: Secondary | ICD-10-CM | POA: Insufficient documentation

## 2021-10-05 NOTE — Patient Instructions (Signed)
Access Code: [email or web address] URL: [email or web address] Date: 10/05/2021 Prepared by: Venetia Night Rasmus Preusser  Exercises Standing Hip Flexor Stretch - 2 x daily - 7 x weekly - 1 sets - 2 reps - 20 hold TL Sidebending Stretch - Single Arm Overhead - 2 x daily - 7 x weekly - 1 sets - 2 reps - 15 hold

## 2021-10-05 NOTE — Therapy (Signed)
[facility] @ [address] [place], [place], [address] Phone: [phone]   Fax:  [phone]  Physical Therapy Evaluation  Patient Details  Name: Nancy Christensen MRN: [id number] Date of Birth: 1951/03/08 Referring Provider (PT): Martinique, Betty G, MD   Encounter Date: 10/05/2021   PT End of Session - 10/05/21 1354     Visit Number 1    Date for PT Re-Evaluation 11/16/21    Authorization Type Medicare Part A/B, KX at visit 15    Progress Note Due on Visit 10    PT Start Time 0932    PT Stop Time 6834    PT Time Calculation (min) 43 min    Activity Tolerance Patient tolerated treatment well    Behavior During Therapy [facility] for tasks assessed/performed             Past Medical History:  Diagnosis Date   Arthritis    left hip and knee   Atypical chest pain 10/25/2018   Cholecystitis with cholelithiasis 02/11/2018   Chronic right shoulder pain 07/04/2018   Constipation due to outlet dysfunction    Dizziness 10/25/2018   Dyslipidemia 10/25/2018   Essential hypertension 04/28/2018   Changed arb to CCB 04/24/2018 due to cough x one month trial    Hyperlipidemia    Hypertension    Hypertensive disorder 04/18/2018   Multiple pulmonary nodules determined by computed tomography of lung 01/26/2018   Passive smoke exp/ remote  Chest CT  09/24/17 MPN's  Largest 4 x 6 mm R laterally  > rec f/u 09/14/18 (reminder file)   As pt reports neg CT 2015 (not avail)  - ESR 01/25/2018 = 35  - CT chest 07/11/18 =  5 mm > no f/u needed    Primary osteoarthritis of left hip 11/26/2019   Primary osteoarthritis of left knee 03/11/2020   Rectal pain    Sinus bradycardia 10/25/2018   Unilateral primary osteoarthritis, left knee 06/14/2018   Upper airway cough syndrome 01/25/2018   Onset 1980 CT head 12/22/17 neg sinus dz FENO 01/25/2018  =   21 - Allergy profile 01/25/2018 >  Eos 0.1 /  IgE  17 RAST neg  - flare 02/12/18 p et  - gabapentin 100 mg tid 03/23/2018 >  increased to max of 300 tid 04/24/2018 > improved so increased to 300 qid 06/05/2018 > d/c'd around 02/2019 s flare     Past Surgical History:  Procedure Laterality Date   ABDOMINAL HYSTERECTOMY  1997   ANAL RECTAL MANOMETRY N/A 08/09/2019   Procedure: ANO RECTAL MANOMETRY;  Surgeon: Thornton Park, MD;  Location: [facility];  Service: Gastroenterology;  Laterality: N/A;   BREAST SURGERY  2002   left cyst removal    CHOLECYSTECTOMY N/A 02/12/2018   Procedure: LAPAROSCOPIC CHOLECYSTECTOMY;  Surgeon: Leighton Ruff, MD;  Location: [facility];  Service: General;  Laterality: N/A;   COLONOSCOPY     POLYPECTOMY      There were no vitals filed for this visit.    Subjective Assessment - 10/05/21 0933     Subjective Pt referred to OPPT for recurring Rt >Lt LBP since 2021.  No pattern of onset of pain but keeps happening.  Pain lasts 1-2 mos each episode.  Pt uses heat and OTC meds to help manage.  It eventually goes away but then comes back.  Last flare up was 2 weeks ago and pain is slowly improving.    Pertinent History OA Lt knee, hip pain -  has done PT for Lt knee and hip    Limitations Sitting;Lifting;Standing;Walking;House hold activities;Other (comment)   breathing   Patient Stated Goals understand where pain is coming from and prevent it in future    Currently in Pain? Yes    Pain Score 3    gets up to a 10/10   Pain Location Back    Pain Orientation Right    Pain Descriptors / Indicators Sharp;Aching;Dull;Tightness;Heaviness   sharp when flared up   Pain Type Chronic pain;Acute pain    Pain Radiating Towards to Lt side of back    Pain Onset 1 to 4 weeks ago   chronic with acute flare ups   Pain Frequency Constant    Aggravating Factors  when acutely flared everything even breathing    Pain Relieving Factors heat, meds    Effect of Pain on Daily Activities all activity when acutely flared up                [facility] PT Assessment - 10/05/21 0001       Assessment   Medical  Diagnosis M54.50,G89.29 (ICD-10-CM) - Chronic right-sided low back pain without sciatica    Referring Provider (PT) Martinique, Betty G, MD    Onset Date/Surgical Date --   chronic x 1-2 years with acute flare ups   Next MD Visit as needed    Prior Therapy for Lt hip and knee      Precautions   Precautions None      Restrictions   Weight Bearing Restrictions No      Balance Screen   Has the patient fallen in the past 6 months No    Has the patient had a decrease in activity level because of a fear of falling?  No    Is the patient reluctant to leave their home because of a fear of falling?  No      Home Environment   Living Environment Private residence    Living Arrangements Alone    Type of [place] to enter    Entrance Stairs-Number of Steps [place] One level      Prior Function   Level of Independence Independent    Vocation Part time employment    Vocation Requirements standing, sitting    Leisure reading, gardening w pots on balcony, walking, aerobics 2x/week      Cognition   Overall Cognitive Status Within Functional Limits for tasks assessed      Observation/Other Assessments   Focus on Therapeutic Outcomes (FOTO)  83% (goal 79%)      Functional Tests   Functional tests Single leg stance;Squat      Squat   Comments WNL      Single Leg Stance   Comments Lt trendelenburg, good balance on Rt/Lt      Posture/Postural Control   Posture/Postural Control Postural limitations    Postural Limitations Decreased thoracic kyphosis      ROM / Strength   AROM / PROM / Strength AROM;Strength      AROM   AROM Assessment Site Lumbar    Lumbar Flexion full    Lumbar Extension full    Lumbar - Right Side Bend end range limited and Rt side "pinch"    Lumbar - Left Side Bend good Rt sided stretch    Lumbar - Right Rotation mild end range pain    Lumbar - Left Rotation WNL/stretch on Rt side  Strength   Overall Strength Comments  5/5 bil LEs with exception of Lt hip 4/5, Lt knee 4/5      Flexibility   Soft Tissue Assessment /Muscle Length yes    Quadriceps hip flexor and quad limited end range Rt >Lt 20%    Quadratus Lumborum Rt limited 25%      Palpation   Spinal mobility limited mid-thoracic rib springing Lt    Palpation comment Rt QL, Rt glute med, proximal hip flexors and adductors on Rt                        Objective measurements completed on examination: See above findings.       [facility] Adult PT Treatment/Exercise - 10/05/21 0001       Exercises   Exercises Lumbar      Lumbar Exercises: Stretches   Other Lumbar Stretch Exercise QL and hip flexor combo in doorframe, Rt leg back Rt arm reach up and over 2x20 sec    Other Lumbar Stretch Exercise standing Lt SB with Rt UE reach up and over, VC to fix Rt foot to floor 2x15"                       PT Short Term Goals - 10/05/21 1405       PT SHORT TERM GOAL #1   Title be independent in intial HEP    Time 3    Period Weeks    Status New    Target Date 10/26/21      PT SHORT TERM GOAL #2   Title -      PT SHORT TERM GOAL #3   Title -               PT Long Term Goals - 10/05/21 1406       PT LONG TERM GOAL #1   Title Pt will demo full trunk Rt SB and Rt Rot with min or no pain in Rt lumbar spine    Time 6    Period Weeks    Status New    Target Date 11/16/21      PT LONG TERM GOAL #2   Title Normalize flexibility of proximal Rt hip flexor, quad and adductors to reduce anterior pull on pelvis.    Baseline -    Time 6    Period Weeks    Status New    Target Date 11/16/21      PT LONG TERM GOAL #3   Title Pt will achieve at least 4+/5 strength in Lt hip abd and quad for improved closed chain stability for stairs, gait and squatting    Baseline -    Time 6    Period Weeks    Status New    Target Date 11/16/21      PT LONG TERM GOAL #4   Title Pt will be able to demo core and trunk control for  proper technique with bend/lift and carry to reduce risk of acute LBP with functional tasks.    Baseline -    Time 6    Period Weeks    Status New    Target Date 11/16/21      PT LONG TERM GOAL #5   Title maintain FOTO score of 83% or better which was answered per current improved pain state.    Baseline 83% (goal 79%)    Time 6    Period Weeks  Status New    Target Date 11/16/21                    Plan - 10/05/21 1355     Clinical Impression Statement Pt is a pleasant 71yo female with history of multiple acute episodes of Rt LBP.  When pain comes on it reaches 10/10 and limits all physical activity especially twisting, reaching, and breathing.  Pain sometimes radiates to Lt lumbar region.  Episodes started in 2021 and can last up to 2 months.  She cannot identify a pattern of cause for onset of pain.  Most recent flare up was 2 weeks ago but pain is now down to a 3/10.  Pt presents with hypermobility of bil hips and trunk flexion/extension.  She has full length of bil hamstrings but is limited in Rt QL, Rt>Lt hip flexors, adductors and quads with tenderness proximally at Rt anterior and medial hip and groin.  She has end range pain into Rt SB and Rt Rot, with pain in Rt QL.  LE strength is 5/5 on Rt, 4/5 Lt hip abd and knee ext, and core and trunk are 4/5.  Lt mid-thoracic stiffness is present along ribcage.  Pt will benefit from skilled PT to address findings from evaluation to optimize mobility and stability to break pattern of recurring episodes of LBP.    Personal Factors and Comorbidities Time since onset of injury/illness/exacerbation    Examination-Activity Limitations Locomotion Level;Transfers;Bend;Reach Overhead;Carry;Sit;Sleep;Squat;Stairs;Stand;Lift;Dressing   when pain is bad   Examination-Participation Restrictions Cleaning;Laundry;Community Activity;Shop;Meal Prep   when pain is bad   Stability/Clinical Decision Making Stable/Uncomplicated    Clinical Decision  Making Low    Rehab Potential Excellent    PT Frequency 2x / week    PT Duration 6 weeks    PT Treatment/Interventions ADLs/Self Care Home Management;Moist Heat;Functional mobility training;Therapeutic activities;Therapeutic exercise;Neuromuscular re-education;Patient/family education;Manual techniques;Dry needling;Taping;Joint Manipulations;Spinal Manipulations    PT Next Visit Plan f/u on HEP, intro core, Lt thoracic mobs in prone, STM Rt QL, proximal hip flexors/quads/adductors on Rt    PT Home Exercise Plan Access Code: [email or web address]    Consulted and Agree with Plan of Care Patient             Patient will benefit from skilled therapeutic intervention in order to improve the following deficits and impairments:  Decreased strength, Impaired flexibility, Pain, Postural dysfunction, Increased muscle spasms  Visit Diagnosis: Right-sided low back pain without sciatica, unspecified chronicity - Plan: PT plan of care cert/re-cert  Muscle weakness (generalized) - Plan: PT plan of care cert/re-cert     Problem List Patient Active Problem List   Diagnosis Date Noted   Hypertension    Hyperlipidemia    Arthritis    Primary osteoarthritis of left knee 03/11/2020   Primary osteoarthritis of left hip 11/26/2019   Constipation due to outlet dysfunction    Rectal pain    Dizziness 10/25/2018   Sinus bradycardia 10/25/2018   Atypical chest pain 10/25/2018   Dyslipidemia 10/25/2018   Chronic right shoulder pain 07/04/2018   Unilateral primary osteoarthritis, left knee 06/14/2018   Essential hypertension 04/28/2018   Hypertensive disorder 04/18/2018   Cholecystitis with cholelithiasis 02/11/2018   Multiple pulmonary nodules determined by computed tomography of lung 01/26/2018   Upper airway cough syndrome 01/25/2018    Baruch Merl, PT 10/05/21 2:12 PM   [facility] @ [address] [place], [place], [address] Phone:  [phone]   Fax:  [phone]  Name: Nancy Christensen MRN: [id number] Date of Birth: 09-13-1951

## 2021-10-07 ENCOUNTER — Ambulatory Visit: Payer: Medicare Other | Admitting: Physical Therapy

## 2021-10-07 ENCOUNTER — Encounter: Payer: Self-pay | Admitting: Physical Therapy

## 2021-10-07 ENCOUNTER — Other Ambulatory Visit: Payer: Self-pay

## 2021-10-07 DIAGNOSIS — M6281 Muscle weakness (generalized): Secondary | ICD-10-CM | POA: Diagnosis not present

## 2021-10-07 DIAGNOSIS — M545 Low back pain, unspecified: Secondary | ICD-10-CM

## 2021-10-07 DIAGNOSIS — G8929 Other chronic pain: Secondary | ICD-10-CM | POA: Diagnosis not present

## 2021-10-07 NOTE — Therapy (Signed)
[facility] @ [facility] [place] [place], [place], [address] Phone: [phone]   Fax:  [phone]  Physical Therapy Treatment  Patient Details  Name: Nancy Christensen MRN: [id number] Date of Birth: 1951-08-29 Referring Provider (PT): Martinique, Betty G, MD   Encounter Date: 10/07/2021   PT End of Session - 10/07/21 1106     Visit Number 2    Date for PT Re-Evaluation 11/16/21    Authorization Type Medicare Part A/B, KX at visit 15    Progress Note Due on Visit 10    PT Start Time 1100    PT Stop Time 1145    PT Time Calculation (min) 45 min    Activity Tolerance Patient tolerated treatment well    Behavior During Therapy [facility] for tasks assessed/performed             Past Medical History:  Diagnosis Date   Arthritis    left hip and knee   Atypical chest pain 10/25/2018   Cholecystitis with cholelithiasis 02/11/2018   Chronic right shoulder pain 07/04/2018   Constipation due to outlet dysfunction    Dizziness 10/25/2018   Dyslipidemia 10/25/2018   Essential hypertension 04/28/2018   Changed arb to CCB 04/24/2018 due to cough x one month trial    Hyperlipidemia    Hypertension    Hypertensive disorder 04/18/2018   Multiple pulmonary nodules determined by computed tomography of lung 01/26/2018   Passive smoke exp/ remote  Chest CT  09/24/17 MPN's  Largest 4 x 6 mm R laterally  > rec f/u 09/14/18 (reminder file)   As pt reports neg CT 2015 (not avail)  - ESR 01/25/2018 = 35  - CT chest 07/11/18 =  5 mm > no f/u needed    Primary osteoarthritis of left hip 11/26/2019   Primary osteoarthritis of left knee 03/11/2020   Rectal pain    Sinus bradycardia 10/25/2018   Unilateral primary osteoarthritis, left knee 06/14/2018   Upper airway cough syndrome 01/25/2018   Onset 1980 CT head 12/22/17 neg sinus dz FENO 01/25/2018  =   21 - Allergy profile 01/25/2018 >  Eos 0.1 /  IgE  17 RAST neg  - flare 02/12/18 p et  - gabapentin 100 mg tid 03/23/2018 >  increased to max of 300 tid 04/24/2018 > improved so increased to 300 qid 06/05/2018 > d/c'd around 02/2019 s flare     Past Surgical History:  Procedure Laterality Date   ABDOMINAL HYSTERECTOMY  1997   ANAL RECTAL MANOMETRY N/A 08/09/2019   Procedure: ANO RECTAL MANOMETRY;  Surgeon: Thornton Park, MD;  Location: [facility];  Service: Gastroenterology;  Laterality: N/A;   BREAST SURGERY  2002   left cyst removal    CHOLECYSTECTOMY N/A 02/12/2018   Procedure: LAPAROSCOPIC CHOLECYSTECTOMY;  Surgeon: Leighton Ruff, MD;  Location: [facility];  Service: General;  Laterality: N/A;   COLONOSCOPY     POLYPECTOMY      There were no vitals filed for this visit.   Subjective Assessment - 10/07/21 1104     Subjective I got a little sore this morning after my stretches.    Pertinent History OA Lt knee, hip pain - has done PT for Lt knee and hip    Limitations Sitting;Lifting;Standing;Walking;House hold activities;Other (comment)    Patient Stated Goals understand where pain is coming from and prevent it in future    Currently in Pain? Yes    Pain Score 3  Pain Location Back    Pain Orientation Right    Pain Descriptors / Indicators Tightness;Heaviness    Pain Type Chronic pain    Pain Onset 1 to 4 weeks ago    Pain Frequency Intermittent                               [facility] Adult PT Treatment/Exercise - 10/07/21 0001       Exercises   Exercises Lumbar;Knee/Hip;Shoulder      Lumbar Exercises: Aerobic   Nustep L4 seat 78 (old model) x 4' PT present to review status      Lumbar Exercises: Quadruped   [place]cat/Old Horse 5 reps    Opposite Arm/Leg Raise Right arm/Left leg;Left arm/Right leg    Opposite Arm/Leg Raise Limitations 2 rounds, 5 sec    Other Quadruped Lumbar Exercises child's pose 3-way 2 rounds 10 sec holds each    Other Quadruped Lumbar Exercises quadruped TA 2x5" holds      Knee/Hip Exercises: Stretches   Hip Flexor Stretch Right;2 reps;20 seconds     Hip Flexor Stretch Limitations breathe into stretch, doorway with overhe    Other Knee/Hip Stretches standing SB with overhead reach Rt QL stretch x 15 sec      Knee/Hip Exercises: Seated   Long Arc Quad Strengthening;Both;1 set;10 reps;Weights    Long Arc Quad Weight --   2.5     Shoulder Exercises: Standing   Extension Strengthening;Both;5 reps;Theraband    Theraband Level (Shoulder Extension) Level 3 (Green)    Extension Limitations 5 sec holds for core awareness    Row Strengthening;Both;15 reps;Theraband    Theraband Level (Shoulder Row) Level 3 (Green)      Manual Therapy   Manual Therapy Soft tissue mobilization    Soft tissue mobilization Addaday assisted STM Rt proximal hip flexors                       PT Short Term Goals - 10/05/21 1405       PT SHORT TERM GOAL #1   Title be independent in intial HEP    Time 3    Period Weeks    Status New    Target Date 10/26/21      PT SHORT TERM GOAL #2   Title -      PT SHORT TERM GOAL #3   Title -               PT Long Term Goals - 10/05/21 1406       PT LONG TERM GOAL #1   Title Pt will demo full trunk Rt SB and Rt Rot with min or no pain in Rt lumbar spine    Time 6    Period Weeks    Status New    Target Date 11/16/21      PT LONG TERM GOAL #2   Title Normalize flexibility of proximal Rt hip flexor, quad and adductors to reduce anterior pull on pelvis.    Baseline -    Time 6    Period Weeks    Status New    Target Date 11/16/21      PT LONG TERM GOAL #3   Title Pt will achieve at least 4+/5 strength in Lt hip abd and quad for improved closed chain stability for stairs, gait and squatting    Baseline -    Time 6  Period Weeks    Status New    Target Date 11/16/21      PT LONG TERM GOAL #4   Title Pt will be able to demo core and trunk control for proper technique with bend/lift and carry to reduce risk of acute LBP with functional tasks.    Baseline -    Time 6    Period  Weeks    Status New    Target Date 11/16/21      PT LONG TERM GOAL #5   Title maintain FOTO score of 83% or better which was answered per current improved pain state.    Baseline 83% (goal 79%)    Time 6    Period Weeks    Status New    Target Date 11/16/21                   Plan - 10/07/21 1339     Clinical Impression Statement Session spent building HEP for trunk flexibility, core, postural strength and LEs.  Pt was educated throughout session about postural alignment targeting sternum over pubic bone.  Pt with good TA activation in quadruped, SL and standing today.  Addaday assisted STM used end of session for Rt proximal hip flexors and quads.  Ran out of time for more manual therapy to Rt QL and Lt thoracic spine/ribcage so will plan for that next session.    Rehab Potential Excellent    PT Frequency 2x / week    PT Duration 6 weeks    PT Treatment/Interventions ADLs/Self Care Home Management;Moist Heat;Functional mobility training;Therapeutic activities;Therapeutic exercise;Neuromuscular re-education;Patient/family education;Manual techniques;Dry needling;Taping;Joint Manipulations;Spinal Manipulations    PT Next Visit Plan f/u on HEP, add weighted sit to stand for quad strength, review bird dog, Rt QL STM, Lt thoracic mobs    PT Home Exercise Plan Access Code: [email or web address]    Consulted and Agree with Plan of Care Patient             Patient will benefit from skilled therapeutic intervention in order to improve the following deficits and impairments:     Visit Diagnosis: Right-sided low back pain without sciatica, unspecified chronicity  Muscle weakness (generalized)     Problem List Patient Active Problem List   Diagnosis Date Noted   Hypertension    Hyperlipidemia    Arthritis    Primary osteoarthritis of left knee 03/11/2020   Primary osteoarthritis of left hip 11/26/2019   Constipation due to outlet dysfunction    Rectal pain    Dizziness 10/25/2018    Sinus bradycardia 10/25/2018   Atypical chest pain 10/25/2018   Dyslipidemia 10/25/2018   Chronic right shoulder pain 07/04/2018   Unilateral primary osteoarthritis, left knee 06/14/2018   Essential hypertension 04/28/2018   Hypertensive disorder 04/18/2018   Cholecystitis with cholelithiasis 02/11/2018   Multiple pulmonary nodules determined by computed tomography of lung 01/26/2018   Upper airway cough syndrome 01/25/2018    Baruch Merl, PT 10/07/21 1:43 PM   [facility] @ [address] [place], [place], [address] Phone: [phone]   Fax:  [phone]  Name: Nancy Christensen MRN: [id number] Date of Birth: 1951-05-13

## 2021-10-07 NOTE — Patient Instructions (Signed)
Access Code: [email or web address] URL: [email or web address] Date: 10/07/2021 Prepared by: Venetia Night Evander Macaraeg  Exercises Standing Hip Flexor Stretch - 2 x daily - 7 x weekly - 1 sets - 2 reps - 20 hold TL Sidebending Stretch - Single Arm Overhead - 2 x daily - 7 x weekly - 1 sets - 2 reps - 15 hold Cat Cow - 1 x daily - 7 x weekly - 1 sets - 5 reps Child's Pose Stretch - 1 x daily - 7 x weekly - 1 sets - 3 reps - 10 hold Child's Pose with Sidebending - 1 x daily - 7 x weekly - 1 sets - 3 reps - 10 hold Bird Dog - 1 x daily - 7 x weekly - 1 sets - 3 reps - 5 hold Clamshell - 1 x daily - 7 x weekly - 3 sets - 5 reps Sidelying Hip Abduction - 1 x daily - 7 x weekly - 3 sets - 5 reps Standing Row with Anchored Resistance - 1 x daily - 7 x weekly - 1 sets - 15 reps Standing Shoulder Extension with Resistance - 1 x daily - 7 x weekly - 1 sets - 5 reps - 5 hold

## 2021-10-14 ENCOUNTER — Ambulatory Visit: Payer: Medicare Other | Admitting: Physical Therapy

## 2021-10-14 ENCOUNTER — Encounter: Payer: Self-pay | Admitting: Physical Therapy

## 2021-10-14 ENCOUNTER — Other Ambulatory Visit: Payer: Self-pay

## 2021-10-14 DIAGNOSIS — M545 Low back pain, unspecified: Secondary | ICD-10-CM

## 2021-10-14 DIAGNOSIS — M6281 Muscle weakness (generalized): Secondary | ICD-10-CM

## 2021-10-14 DIAGNOSIS — G8929 Other chronic pain: Secondary | ICD-10-CM | POA: Diagnosis not present

## 2021-10-14 DIAGNOSIS — R252 Cramp and spasm: Secondary | ICD-10-CM

## 2021-10-14 NOTE — Therapy (Signed)
[facility] [place], [place], [address] Phone: [phone]   Fax:  [phone]  Physical Therapy Treatment  Patient Details  Name: Nancy Christensen MRN: [id number] Date of Birth: Oct 26, 1950 Referring Provider (PT): Martinique, Betty G, MD   Encounter Date: 10/14/2021   PT End of Session - 10/14/21 1057     Visit Number 3    Date for PT Re-Evaluation 11/16/21    Authorization Type Medicare Part A/B, KX at visit 15    Progress Note Due on Visit 10    PT Start Time 1018    PT Stop Time 1058    PT Time Calculation (min) 40 min    Activity Tolerance Patient tolerated treatment well    Behavior During Therapy [facility] for tasks assessed/performed             Past Medical History:  Diagnosis Date   Arthritis    left hip and knee   Atypical chest pain 10/25/2018   Cholecystitis with cholelithiasis 02/11/2018   Chronic right shoulder pain 07/04/2018   Constipation due to outlet dysfunction    Dizziness 10/25/2018   Dyslipidemia 10/25/2018   Essential hypertension 04/28/2018   Changed arb to CCB 04/24/2018 due to cough x one month trial    Hyperlipidemia    Hypertension    Hypertensive disorder 04/18/2018   Multiple pulmonary nodules determined by computed tomography of lung 01/26/2018   Passive smoke exp/ remote  Chest CT  09/24/17 MPN's  Largest 4 x 6 mm R laterally  > rec f/u 09/14/18 (reminder file)   As pt reports neg CT 2015 (not avail)  - ESR 01/25/2018 = 35  - CT chest 07/11/18 =  5 mm > no f/u needed    Primary osteoarthritis of left hip 11/26/2019   Primary osteoarthritis of left knee 03/11/2020   Rectal pain    Sinus bradycardia 10/25/2018   Unilateral primary osteoarthritis, left knee 06/14/2018   Upper airway cough syndrome 01/25/2018   Onset 1980 CT head 12/22/17 neg sinus dz FENO 01/25/2018  =   21 - Allergy profile 01/25/2018 >  Eos 0.1 /  IgE  17 RAST neg  - flare 02/12/18 p et  - gabapentin 100 mg tid 03/23/2018 >  increased to max of 300 tid 04/24/2018 > improved so increased to 300 qid 06/05/2018 > d/c'd around 02/2019 s flare     Past Surgical History:  Procedure Laterality Date   ABDOMINAL HYSTERECTOMY  1997   ANAL RECTAL MANOMETRY N/A 08/09/2019   Procedure: ANO RECTAL MANOMETRY;  Surgeon: Thornton Park, MD;  Location: [facility];  Service: Gastroenterology;  Laterality: N/A;   BREAST SURGERY  2002   left cyst removal    CHOLECYSTECTOMY N/A 02/12/2018   Procedure: LAPAROSCOPIC CHOLECYSTECTOMY;  Surgeon: Leighton Ruff, MD;  Location: [facility];  Service: General;  Laterality: N/A;   COLONOSCOPY     POLYPECTOMY      There were no vitals filed for this visit.   Subjective Assessment - 10/14/21 1021     Subjective Pain went to a 5/10 after last session - just sore from the exercises.  It is back to a 4/10 now.  I can now do the HEP and not have pain go up.    Pertinent History OA Lt knee, hip pain - has done PT for Lt knee and hip    Limitations Sitting;Lifting;Standing;Walking;House hold activities;Other (comment)    Patient Stated Goals understand where pain  is coming from and prevent it in future    Currently in Pain? Yes    Pain Score 4     Pain Location Back    Pain Orientation Right    Pain Descriptors / Indicators Tightness;Heaviness    Pain Type Chronic pain    Pain Radiating Towards to Lt side of back when it gets bad    Pain Onset More than a month ago    Pain Frequency Intermittent                               [facility] Adult PT Treatment/Exercise - 10/14/21 0001       Self-Care   Self-Care Other Self-Care Comments    Other Self-Care Comments  verbal review of HEP and discussed appropriate frequency to allow for muscles to adjust and meet demands of ther ex (Pt was doing 3x/day)      Exercises   Exercises Lumbar;Knee/Hip;Shoulder      Lumbar Exercises: Aerobic   Nustep L4 x 5' seat 9 new model, PT present to discuss symptoms      Shoulder Exercises:  ROM/Strengthening   UBE (Upper Arm Bike) L2 2x2 fwd/bwd PT present to monitor tolerance      Manual Therapy   Manual Therapy Joint mobilization;Soft tissue mobilization    Joint Mobilization Rt thoracic rib springing and UPAs T7-T10    Soft tissue mobilization Rt thoracic paraspinals                       PT Short Term Goals - 10/05/21 1405       PT SHORT TERM GOAL #1   Title be independent in intial HEP    Time 3    Period Weeks    Status New    Target Date 10/26/21      PT SHORT TERM GOAL #2   Title -      PT SHORT TERM GOAL #3   Title -               PT Long Term Goals - 10/05/21 1406       PT LONG TERM GOAL #1   Title Pt will demo full trunk Rt SB and Rt Rot with min or no pain in Rt lumbar spine    Time 6    Period Weeks    Status New    Target Date 11/16/21      PT LONG TERM GOAL #2   Title Normalize flexibility of proximal Rt hip flexor, quad and adductors to reduce anterior pull on pelvis.    Baseline -    Time 6    Period Weeks    Status New    Target Date 11/16/21      PT LONG TERM GOAL #3   Title Pt will achieve at least 4+/5 strength in Lt hip abd and quad for improved closed chain stability for stairs, gait and squatting    Baseline -    Time 6    Period Weeks    Status New    Target Date 11/16/21      PT LONG TERM GOAL #4   Title Pt will be able to demo core and trunk control for proper technique with bend/lift and carry to reduce risk of acute LBP with functional tasks.    Baseline -    Time 6    Period Weeks    Status  New    Target Date 11/16/21      PT LONG TERM GOAL #5   Title maintain FOTO score of 83% or better which was answered per current improved pain state.    Baseline 83% (goal 79%)    Time 6    Period Weeks    Status New    Target Date 11/16/21                   Plan - 10/14/21 1057     Clinical Impression Statement Pt reported increased pain from 3/5 to 5/5 initially with HEP but admitted  she is an "over-doer" and was performing 3x/day.  Since tapering to 2x/day pain is 4/10.  Session focused on manual techniques to improve soft tissue and joint mobility of stiff Rt thoracic segments T7-T10.  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  Pt with more localized soreness end of session with feeling of movement with greater ease.  PT plans to explore ribcage fascia and diaphragm next visit on Rt side.    PT Frequency 2x / week    PT Duration 6 weeks    PT Treatment/Interventions ADLs/Self Care Home Management;Moist Heat;Functional mobility training;Therapeutic activities;Therapeutic exercise;Neuromuscular re-education;Patient/family education;Manual techniques;Dry needling;Taping;Joint Manipulations;Spinal Manipulations    PT Next Visit Plan STM ribcage fascia and diaphragm next visit on Rt side    PT Home Exercise Plan Access Code: [email or web address]    Consulted and Agree with Plan of Care Patient             Patient will benefit from skilled therapeutic intervention in order to improve the following deficits and impairments:     Visit Diagnosis: Right-sided low back pain without sciatica, unspecified chronicity  Muscle weakness (generalized)  Cramp and spasm     Problem List Patient Active Problem List   Diagnosis Date Noted   Hypertension    Hyperlipidemia    Arthritis    Primary osteoarthritis of left knee 03/11/2020   Primary osteoarthritis of left hip 11/26/2019   Constipation due to outlet dysfunction    Rectal pain    Dizziness 10/25/2018   Sinus bradycardia 10/25/2018   Atypical chest pain 10/25/2018   Dyslipidemia 10/25/2018   Chronic right shoulder pain 07/04/2018   Unilateral primary osteoarthritis, left knee 06/14/2018   Essential hypertension 04/28/2018   Hypertensive disorder 04/18/2018   Cholecystitis with cholelithiasis 02/11/2018   Multiple pulmonary nodules determined  by computed tomography of lung 01/26/2018   Upper airway cough syndrome 01/25/2018    Baruch Merl, PT 10/14/21 11:01 AM   [facility] @ [address] [place], [place], [address] Phone: [phone]   Fax:  [phone]  Name: ALSACE DOWD MRN: [id number] Date of Birth: [birth year]

## 2021-10-19 ENCOUNTER — Ambulatory Visit: Payer: Medicare Other | Admitting: Physical Therapy

## 2021-10-19 ENCOUNTER — Emergency Department (HOSPITAL_COMMUNITY)
Admission: EM | Admit: 2021-10-19 | Discharge: 2021-10-19 | Disposition: A | Discharge status: Home / Self Care | Payer: Medicare Other | Attending: Emergency Medicine | Admitting: Emergency Medicine

## 2021-10-19 ENCOUNTER — Other Ambulatory Visit: Payer: Self-pay

## 2021-10-19 ENCOUNTER — Encounter (HOSPITAL_COMMUNITY): Payer: Self-pay

## 2021-10-19 ENCOUNTER — Telehealth (HOSPITAL_COMMUNITY): Payer: Self-pay | Admitting: Student

## 2021-10-19 DIAGNOSIS — R040 Epistaxis: Secondary | ICD-10-CM | POA: Insufficient documentation

## 2021-10-19 DIAGNOSIS — R000 Tachycardia, unspecified: Secondary | ICD-10-CM | POA: Diagnosis not present

## 2021-10-19 DIAGNOSIS — H9223 Otorrhagia, bilateral: Secondary | ICD-10-CM | POA: Diagnosis not present

## 2021-10-19 DIAGNOSIS — Z79899 Other long term (current) drug therapy: Secondary | ICD-10-CM | POA: Diagnosis not present

## 2021-10-19 DIAGNOSIS — I10 Essential (primary) hypertension: Secondary | ICD-10-CM | POA: Insufficient documentation

## 2021-10-19 DIAGNOSIS — Z743 Need for continuous supervision: Secondary | ICD-10-CM | POA: Diagnosis not present

## 2021-10-19 DIAGNOSIS — R58 Hemorrhage, not elsewhere classified: Secondary | ICD-10-CM | POA: Diagnosis not present

## 2021-10-19 DIAGNOSIS — R231 Pallor: Secondary | ICD-10-CM | POA: Diagnosis not present

## 2021-10-19 LAB — COMPREHENSIVE METABOLIC PANEL
ALT: 15 U/L (ref 0–44)
AST: 18 U/L (ref 15–41)
Albumin: 3.8 g/dL (ref 3.5–5.0)
Alkaline Phosphatase: 52 U/L (ref 38–126)
Anion gap: 7 (ref 5–15)
BUN: 26 mg/dL — ABNORMAL HIGH (ref 8–23)
CO2: 26 mmol/L (ref 22–32)
Calcium: 9.2 mg/dL (ref 8.9–10.3)
Chloride: 104 mmol/L (ref 98–111)
Creatinine, Ser: 1.23 mg/dL — ABNORMAL HIGH (ref 0.44–1.00)
GFR, Estimated: 47 mL/min — ABNORMAL LOW (ref >60)
Glucose, Bld: 124 mg/dL — ABNORMAL HIGH (ref 70–99)
Potassium: 3.9 mmol/L (ref 3.5–5.1)
Sodium: 137 mmol/L (ref 135–145)
Total Bilirubin: 0.6 mg/dL (ref 0.3–1.2)
Total Protein: 6.8 g/dL (ref 6.5–8.1)

## 2021-10-19 LAB — CBC
HCT: 40.8 % (ref 36.0–46.0)
Hemoglobin: 13.4 g/dL (ref 12.0–15.0)
MCH: 28.8 pg (ref 26.0–34.0)
MCHC: 32.8 g/dL (ref 30.0–36.0)
MCV: 87.7 fL (ref 80.0–100.0)
Platelets: 236 10*3/uL (ref 150–400)
RBC: 4.65 MIL/uL (ref 3.87–5.11)
RDW: 14.2 % (ref 11.5–15.5)
WBC: 8.9 10*3/uL (ref 4.0–10.5)
nRBC: 0 % (ref 0.0–0.2)

## 2021-10-19 LAB — PROTIME-INR
INR: 0.9 (ref 0.8–1.2)
Prothrombin Time: 12.2 seconds (ref 11.4–15.2)

## 2021-10-19 MED ORDER — OXYMETAZOLINE HCL 0.05 % NA SOLN
1.0000 | Freq: Once | NASAL | Status: AC
  Administered 2021-10-19: 04:00:00 1 via NASAL
  Filled 2021-10-19: qty 30

## 2021-10-19 MED ORDER — TRANEXAMIC ACID FOR EPISTAXIS
500.0000 mg | Freq: Once | TOPICAL | Status: AC
  Administered 2021-10-19: 04:00:00 500 mg via TOPICAL
  Filled 2021-10-19: qty 10

## 2021-10-19 NOTE — Discharge Instructions (Signed)
Your left nasal is packed, please leave and do not try to remove.  Please try not to sneeze, blow your nose, increase pressure with your head this will cause it to bleed again.  Please remember to take all your medication as prescribed.    I would like to follow-up with ENT, should be removed in the next 5 days.  Come back to the emergency department if you develop chest pain, shortness of breath, severe abdominal pain, uncontrolled nausea, vomiting, diarrhea.

## 2021-10-19 NOTE — ED Notes (Signed)
Pt sitting up in bed, pt coughed up a aprox 3 inch long clot, pt has very scant blood dripping from R nares, rhino rocket in L nares.  Pt reports slight headache.

## 2021-10-19 NOTE — Telephone Encounter (Signed)
Patient was seen in the ED earlier in the day with epistaxis, a rhino rocket was placed and was DC home with follow up at her PCP and or  ENT, spoke with patient, states she is doing alright still  cough up some blood clots but denies nasal bleeding. I explained to the patient that if she continues to cough up clots or symptoms are not improving she should come back to ED for revaluations as the packing may have shifted. Patient voiced understanding and states that she is following up with here PCP tomorrow and Seeing ENT next week.

## 2021-10-19 NOTE — ED Provider Notes (Signed)
[facility] DEPT Provider Note   CSN: [id number] Arrival date & time: 10/19/21  0272     History  Chief Complaint  Patient presents with   Epistaxis    Nancy Christensen is a 71 y.o. female.  HPI  Patient with medical history including hypertension, hyperlipidemia, presents with complaints of a nosebleed.  Patient states nosebleed started around 2 AM yesterday, has been consistent, she is trying to stop the bleeding without much success.  She has no history of nosebleeds in the past, she denies history of abnormal bleeding, she denies abnormal bruising, no history of nosebleed in the past, no history of sinusitis, no recent traumas, not on anticoagulant.  She denies any alleviating factors has only complaints at this time.  She does endorse headaches, lightheadedness, dizziness, chest pain, shortness of breath fatigue.  Home Medications Prior to Admission medications   Medication Sig Start Date End Date Taking? Authorizing Provider  BILE SALTS-CAPS-CASC-PHENOLPTH PO Take by mouth daily as needed (for digestion).   Yes [provider]  cholecalciferol (VITAMIN D3) 25 MCG (1000 UT) tablet Take 1,000 Units by mouth daily.   Yes [provider]  ezetimibe (ZETIA) 10 MG tablet TAKE 1 TABLET BY MOUTH EVERY DAY Patient taking differently: Take 10 mg by mouth daily. 08/02/21  Yes Park Liter, MD  irbesartan (AVAPRO) 75 MG tablet TAKE 1/2 TABLETS BY MOUTH DAILY. Patient taking differently: Take 75 mg by mouth daily. 02/01/21   Billie Ruddy, MD      Allergies    Patient has no known allergies.    Review of Systems   Review of Systems  Constitutional:  Negative for chills and fever.  HENT:  Positive for nosebleeds. Negative for congestion and tinnitus.   Respiratory:  Negative for shortness of breath.   Cardiovascular:  Negative for chest pain.  Gastrointestinal:  Negative for abdominal pain.  Neurological:  Negative for  headaches.   Physical Exam Updated Vital Signs BP (!) 164/131    Pulse 86    Resp (!) 22    Ht 5\' 4"  (1.626 m)    Wt 91.6 kg    SpO2 99%    BMI 34.67 kg/m  Physical Exam Vitals and nursing note reviewed.  Constitutional:      General: She is not in acute distress.    Appearance: She is not ill-appearing.  HENT:     Head: Normocephalic and atraumatic.     Right Ear: Tympanic membrane, ear canal and external ear normal.     Left Ear: Tympanic membrane, ear canal and external ear normal.     Ears:     Comments: Patient had middle ear effusions bilaterally.  No bulging, no signs of infection present.    Nose: No congestion.     Comments: On exam patient had active bleeding from the left nare, but appears to come from the posterior aspect, cannot identify an active source, there is no abrasions or trauma noted in the anterior aspect of the nostril.    Mouth/Throat:     Mouth: Mucous membranes are moist.     Pharynx: Oropharynx is clear. No oropharyngeal exudate or posterior oropharyngeal erythema.  Eyes:     Conjunctiva/sclera: Conjunctivae normal.  Cardiovascular:     Rate and Rhythm: Normal rate and regular rhythm.     Pulses: Normal pulses.     Heart sounds: No murmur heard.   No friction rub. No gallop.  Pulmonary:     Effort:  No respiratory distress.     Breath sounds: No wheezing, rhonchi or rales.  Musculoskeletal:     Right lower leg: No edema.     Left lower leg: No edema.  Skin:    General: Skin is warm and dry.     Coloration: Skin is not jaundiced.     Findings: No bruising.  Neurological:     Mental Status: She is alert.  Psychiatric:        Mood and Affect: Mood normal.    ED Results / Procedures / Treatments   Labs (all labs ordered are listed, but only abnormal results are displayed) Labs Reviewed  COMPREHENSIVE METABOLIC PANEL - Abnormal; Notable for the following components:      Result Value   Glucose, Bld 124 (*)    BUN 26 (*)    Creatinine, Ser  1.23 (*)    GFR, Estimated 47 (*)    All other components within normal limits  CBC  PROTIME-INR    EKG None  Radiology No results found.  Procedures .Epistaxis Management  Date/Time: 10/19/2021 6:55 AM Performed by: Marcello Fennel, PA-C Authorized by: Marcello Fennel, PA-C   Consent:    Consent obtained:  Verbal   Consent given by:  Patient   Risks, benefits, and alternatives were discussed: yes     Risks discussed:  Bleeding, pain, infection and nasal injury   Alternatives discussed:  No treatment, delayed treatment, alternative treatment, observation and referral Universal protocol:    Patient identity confirmed:  Verbally with patient Anesthesia:    Anesthesia method:  None Procedure details:    Treatment site:  L anterior   Treatment method:  Anterior pack and nasal balloon   Treatment complexity:  Extensive   Treatment episode: initial   Post-procedure details:    Assessment:  Bleeding stopped   Procedure completion:  Tolerated    Medications Ordered in ED Medications  oxymetazoline (AFRIN) 0.05 % nasal spray 1 spray (1 spray Each Nare Given 10/19/21 0354)  tranexamic acid (CYKLOKAPRON) 1000 MG/10ML topical solution 500 mg (500 mg Topical Given 10/19/21 0404)    ED Course/ Medical Decision Making/ A&P                           Medical Decision Making Amount and/or Complexity of Data Reviewed Labs: ordered.  Risk OTC drugs.   This patient presents to the ED for concern of nosebleed, this involves an extensive number of treatment options, and is a complaint that carries with it a high risk of complications and morbidity.  The differential diagnosis includes anemia, bleeding disorder    Additional history obtained:  Additional history obtained from electronic medical record  Co morbidities that complicate the patient evaluation  N/A  Social Determinants of Health:  N/A    Lab Tests:  I Ordered, and personally interpreted labs.  The  pertinent results include: CBC unremarkable, CMP shows glucose 124 BUN 26 creatinine 1.23 prothrombin time INR unremarkable     Medicines ordered and prescription drug management:  I ordered medication including TXA nasal packing for epistasis I have reviewed the patients home medicines and have made adjustments as needed    Reevaluation:  On exam patient had active nosebleed, had the patient hold her nose for 10 minutes but was unsuccessful.  Packed left nostril soaked in TXA tolerated procedure well  will continue to monitor  Patient reassessed packing was removed bleeding has resolved will monitor.  Reassessed and fortunately bleeding had recurred recommend Rhino Rocket patient was agreed in this plan, attempted to use the large [employer] but patient not tolerate this, used a small Rhino Rocket tolerated well inflated balloon with 4 cc of air.  Reassessed still tolerating well,  bleeding has since resolved patient agreeable for discharge.  Rule out Low suspicion patient would need blood transfusion as she is hemodynamically stable hemoglobin within normal limits.  Low suspicion patient has a bleeding disorder as her prothrombin time and liver enzymes all within normal limits.    Dispostion and problem list  After consideration of the diagnostic results and the patients response to treatment, I feel that the patent would benefit from   1.  Epistasis-likely posterior, Rhino Rocket in place, will have her follow-up with ENT for further evaluation.  Gave strict return precautions.            Final Clinical Impression(s) / ED Diagnoses Final diagnoses:  Epistaxis    Rx / DC Orders ED Discharge Orders          Ordered    Ambulatory referral to ENT        10/19/21 0702              Marcello Fennel, PA-C 10/19/21 4315    Ripley Fraise, MD 10/19/21 [phone]

## 2021-10-19 NOTE — ED Triage Notes (Signed)
Arrives EMS from home with nosebleed beginning at 0204 this morning. Denies blood thinners. Takes ASA. Per EMS approx 500cc volume lost with large clots.

## 2021-10-19 NOTE — ED Notes (Signed)
Pt in bed, pt states that she will take a taxi home, d/c iv times two, cath intact, dressings placed, pt verbalized understanding d/c instructions and follow up.  Advised to return for any concerns or worsening symptoms.  Pt ambulatory from dpt, provided numbers for taxi.

## 2021-10-20 ENCOUNTER — Encounter: Payer: Self-pay | Admitting: Family Medicine

## 2021-10-20 ENCOUNTER — Ambulatory Visit (INDEPENDENT_AMBULATORY_CARE_PROVIDER_SITE_OTHER): Payer: Medicare Other | Admitting: Family Medicine

## 2021-10-20 VITALS — BP 130/80 | HR 86 | Resp 18 | Wt 193.0 lb

## 2021-10-20 DIAGNOSIS — I10 Essential (primary) hypertension: Secondary | ICD-10-CM

## 2021-10-20 DIAGNOSIS — R040 Epistaxis: Secondary | ICD-10-CM

## 2021-10-20 NOTE — Progress Notes (Signed)
Subjective:    Patient ID: Nancy Christensen, female    DOB: 1951/09/02, 71 y.o.   MRN: [id number]  Chief Complaint  Patient presents with   Epistaxis    Onset: 1 week -- 4 episodes , occur in AM     HPI Patient was seen today for follow-up on acute concern.  Patient seen 10/19/2021 in the ED for epistaxis.  Patient diagnosed with posterior nosebleed.  Rhino rocket placed in left naris until patient can be seen by ENT on Monday 1/30.  Patient notes bleeding has stopped.  Today is the first day she has been able to talk as she had lots of large clots in her throat.  Pt denies dizziness.  Had HAs.  Endorses drinking water, but not really eating.  Patient feeling a little bit better.  Has not gotten much sleep.  Afraid to check her blood pressure as she thought it may cause rebleeding as it did in the ED.  Patient notes prior to ED visit several episodes of epistaxis over the last 2 weeks.  Patient states she was eating out more than she typically does and noticed that things seemed a little saltier.  BP has been elevated systolic 979G.    Past Medical History:  Diagnosis Date   Arthritis    left hip and knee   Atypical chest pain 10/25/2018   Cholecystitis with cholelithiasis 02/11/2018   Chronic right shoulder pain 07/04/2018   Constipation due to outlet dysfunction    Dizziness 10/25/2018   Dyslipidemia 10/25/2018   Essential hypertension 04/28/2018   Changed arb to CCB 04/24/2018 due to cough x one month trial    Hyperlipidemia    Hypertension    Hypertensive disorder 04/18/2018   Multiple pulmonary nodules determined by computed tomography of lung 01/26/2018   Passive smoke exp/ remote  Chest CT  09/24/17 MPN's  Largest 4 x 6 mm R laterally  > rec f/u 09/14/18 (reminder file)   As pt reports neg CT 2015 (not avail)  - ESR 01/25/2018 = 35  - CT chest 07/11/18 =  5 mm > no f/u needed    Primary osteoarthritis of left hip 11/26/2019   Primary osteoarthritis of left knee 03/11/2020   Rectal pain    Sinus  bradycardia 10/25/2018   Unilateral primary osteoarthritis, left knee 06/14/2018   Upper airway cough syndrome 01/25/2018   Onset 1980 CT head 12/22/17 neg sinus dz FENO 01/25/2018  =   21 - Allergy profile 01/25/2018 >  Eos 0.1 /  IgE  17 RAST neg  - flare 02/12/18 p et  - gabapentin 100 mg tid 03/23/2018 > increased to max of 300 tid 04/24/2018 > improved so increased to 300 qid 06/05/2018 > d/c'd around 02/2019 s flare     No Known Allergies  ROS General: Denies fever, chills, night sweats, changes in weight, changes in appetite HEENT: Denies headaches, ear pain, changes in vision, rhinorrhea, sore throat +nose bleeds CV: Denies CP, palpitations, SOB, orthopnea Pulm: Denies SOB, cough, wheezing GI: Denies abdominal pain, nausea, vomiting, diarrhea, constipation GU: Denies dysuria, hematuria, frequency, vaginal discharge Msk: Denies muscle cramps, joint pains Neuro: Denies weakness, numbness, tingling Skin: Denies rashes, bruising Psych: Denies depression, anxiety, hallucinations     Objective:    Blood pressure 130/80, pulse 86, resp. rate 18, weight 193 lb (87.5 kg), SpO2 100 %.  Gen. Pleasant, well-nourished, in no distress, normal affect   HEENT: [place]/AT, face symmetric, conjunctiva clear, no scleral icterus, PERRLA, EOMI,  R nares patent with clear/yellowish drainage,  L nares with Rhino rocket in place. pharynx without erythema, exudate, bleeding or visible clots. Neck: No JVD, no thyromegaly, Lungs: no accessory muscle use, CTAB, no wheezes or rales Cardiovascular: RRR, no m/r/g, no peripheral edema Musculoskeletal: No deformities, no cyanosis or clubbing, normal tone Neuro:  A&Ox3, CN II-XII intact, normal gait Skin:  Warm, no lesions/ rash   Wt Readings from Last 3 Encounters:  10/20/21 193 lb (87.5 kg)  10/19/21 202 lb (91.6 kg)  09/22/21 199 lb (90.3 kg)    Lab Results  Component Value Date   WBC 8.9 10/19/2021   HGB 13.4 10/19/2021   HCT 40.8 10/19/2021   PLT 236 10/19/2021    GLUCOSE 124 (H) 10/19/2021   CHOL 267 (H) 03/02/2021   TRIG 95 03/02/2021   HDL 52 03/02/2021   LDLCALC 199 (H) 03/02/2021   ALT 15 10/19/2021   AST 18 10/19/2021   NA 137 10/19/2021   K 3.9 10/19/2021   CL 104 10/19/2021   CREATININE 1.23 (H) 10/19/2021   BUN 26 (H) 10/19/2021   CO2 26 10/19/2021   INR 0.9 10/19/2021   HGBA1C 5.6 03/09/2021    Assessment/Plan:  Epistaxis -controlled with Rhino rocket in place in L nare -likely due to elevated blood pressure and dryness in air -Rhino Rocket to stay in place at least 4-5 days or until ENT appointment on Monday 1/30. -Patient given strict precautions.  Advised to avoid aspirin. -Consider OTC Ayr for right nare dryness -Given handouts  Essential hypertension -Elevated -Pt typically well controlled.  Likely elevated to recent increase sodium intake. -Continue p.o. hydration and lifestyle modifications -Continue current medications including irbesartan 75 mg daily  F/u as needed  Grier Mitts, MD

## 2021-10-20 NOTE — Patient Instructions (Signed)
Ayr nasal saline nasal gel can be found over-the-counter at your local drugstore.  You can help provide some moisture in your nose so that it does not feel as dry.  For any rebleeding proceed to nearest emergency department.  Remember to decrease your sodium intake and increase intake of water and fluids.

## 2021-10-21 ENCOUNTER — Encounter: Payer: Medicare Other | Admitting: Physical Therapy

## 2021-10-25 DIAGNOSIS — R040 Epistaxis: Secondary | ICD-10-CM | POA: Diagnosis not present

## 2021-10-26 ENCOUNTER — Encounter: Payer: Medicare Other | Admitting: Physical Therapy

## 2021-10-28 ENCOUNTER — Encounter: Payer: Medicare Other | Admitting: Physical Therapy

## 2021-10-28 IMAGING — DX DG CHEST 2V
2 series · 2 of 2 positions shown · non-contrast
Comparison: Chest CT September 10, 2018; chest radiograph December 22, 2017

CLINICAL DATA: Cough.  Nodular opacity seen on prior chest CT

EXAM:
CHEST - 2 VIEW

[chest pa]
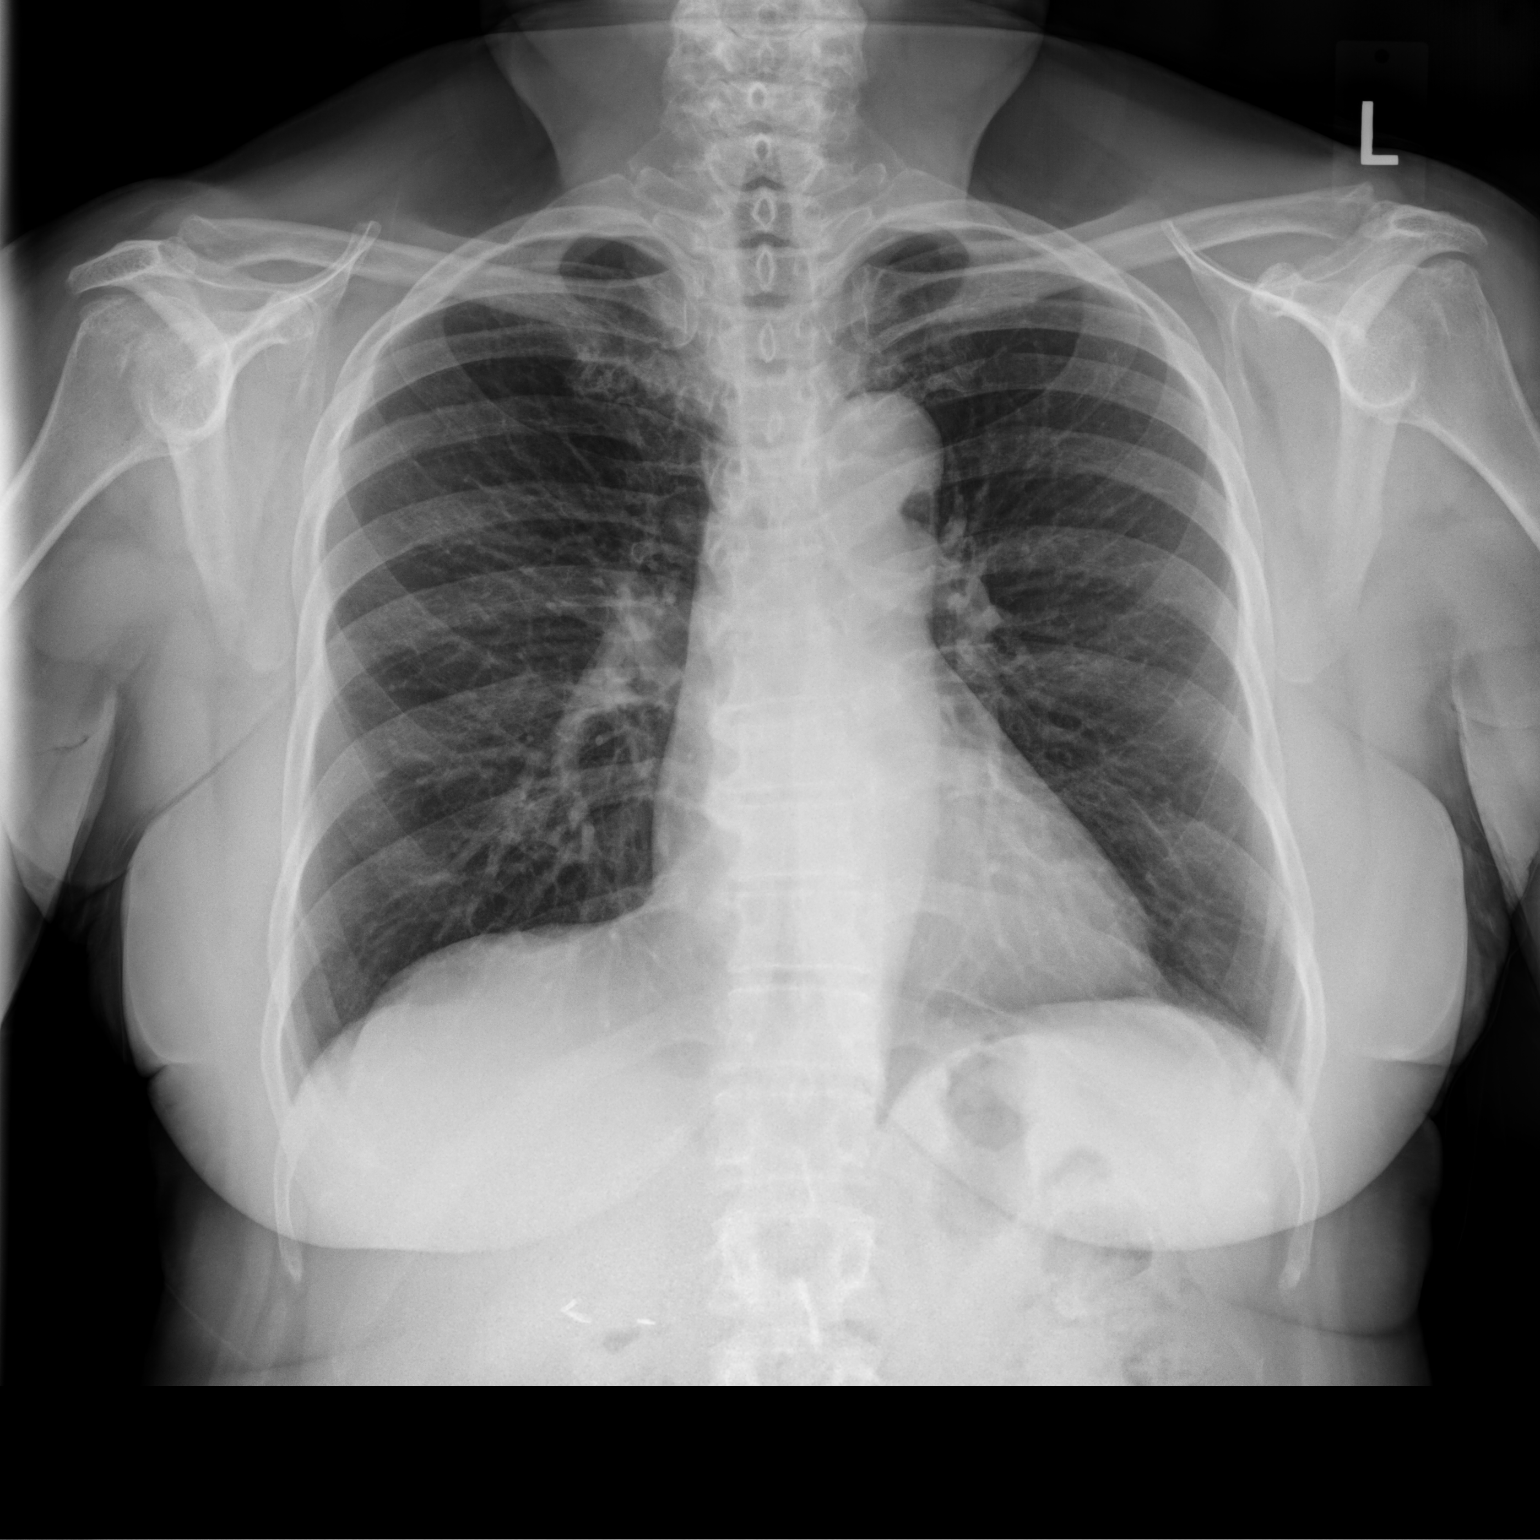

[chest lat]
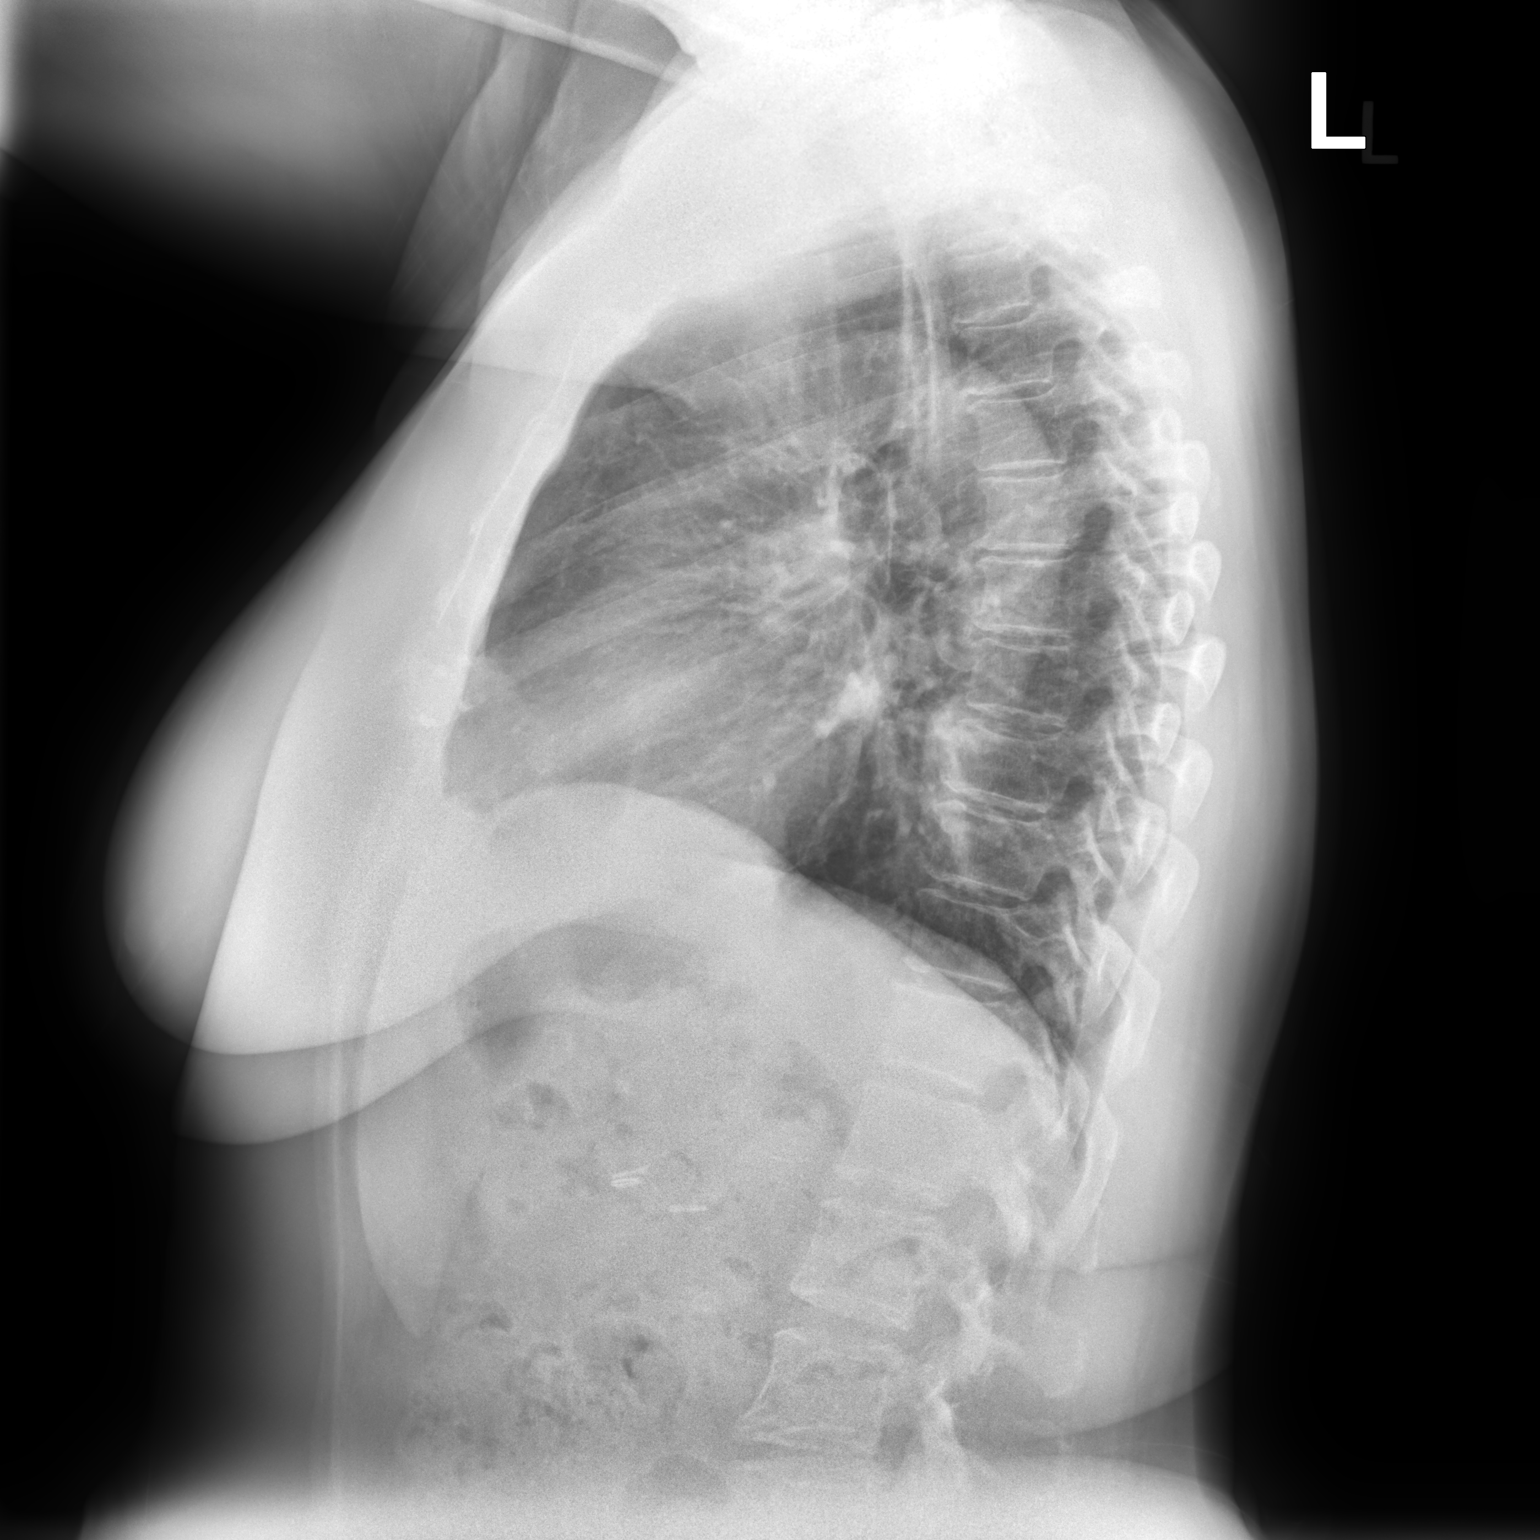

[2 of 2 positions shown; findings below may reference images not displayed]

FINDINGS: There is no edema or consolidation. The small nodular opacity seen
on chest CT are not appreciable by radiography. Heart size and
pulmonary vascularity are normal. No adenopathy. No bone lesions.
IMPRESSION: No edema or consolidation. The nodular opacities seen on prior CT
are not appreciable by radiography.

Heart size normal.  No adenopathy.

## 2021-11-02 ENCOUNTER — Encounter: Payer: Medicare Other | Admitting: Physical Therapy

## 2021-11-04 ENCOUNTER — Other Ambulatory Visit: Payer: Self-pay

## 2021-11-04 ENCOUNTER — Encounter: Payer: Self-pay | Admitting: Physical Therapy

## 2021-11-04 ENCOUNTER — Ambulatory Visit: Payer: Medicare Other | Attending: Family Medicine | Admitting: Physical Therapy

## 2021-11-04 DIAGNOSIS — M6281 Muscle weakness (generalized): Secondary | ICD-10-CM | POA: Diagnosis not present

## 2021-11-04 DIAGNOSIS — M545 Low back pain, unspecified: Secondary | ICD-10-CM | POA: Insufficient documentation

## 2021-11-04 DIAGNOSIS — M25552 Pain in left hip: Secondary | ICD-10-CM | POA: Insufficient documentation

## 2021-11-04 DIAGNOSIS — R252 Cramp and spasm: Secondary | ICD-10-CM | POA: Insufficient documentation

## 2021-11-04 NOTE — Therapy (Signed)
[facility] @ [place], [place], [address] Phone: [phone]   Fax:  [phone]  Physical Therapy Treatment  Patient Details  Name: Nancy Christensen MRN: [id number] Date of Birth: 06-21-1951 Referring Provider (PT): Martinique, Betty G, MD   Encounter Date: 11/04/2021   PT End of Session - 11/04/21 1013     Visit Number 4    Date for PT Re-Evaluation 11/16/21    Authorization Type Medicare Part A/B, KX at visit 15    Progress Note Due on Visit 10    PT Start Time 1448    PT Stop Time 1055    PT Time Calculation (min) 40 min    Activity Tolerance Patient tolerated treatment well    Behavior During Therapy [facility] for tasks assessed/performed             Past Medical History:  Diagnosis Date   Arthritis    left hip and knee   Atypical chest pain 10/25/2018   Cholecystitis with cholelithiasis 02/11/2018   Chronic right shoulder pain 07/04/2018   Constipation due to outlet dysfunction    Dizziness 10/25/2018   Dyslipidemia 10/25/2018   Essential hypertension 04/28/2018   Changed arb to CCB 04/24/2018 due to cough x one month trial    Hyperlipidemia    Hypertension    Hypertensive disorder 04/18/2018   Multiple pulmonary nodules determined by computed tomography of lung 01/26/2018   Passive smoke exp/ remote  Chest CT  09/24/17 MPN's  Largest 4 x 6 mm R laterally  > rec f/u 09/14/18 (reminder file)   As pt reports neg CT 2015 (not avail)  - ESR 01/25/2018 = 35  - CT chest 07/11/18 =  5 mm > no f/u needed    Primary osteoarthritis of left hip 11/26/2019   Primary osteoarthritis of left knee 03/11/2020   Rectal pain    Sinus bradycardia 10/25/2018   Unilateral primary osteoarthritis, left knee 06/14/2018   Upper airway cough syndrome 01/25/2018   Onset 1980 CT head 12/22/17 neg sinus dz FENO 01/25/2018  =   21 - Allergy profile 01/25/2018 >  Eos 0.1 /  IgE  17 RAST neg  - flare 02/12/18 p et  - gabapentin 100 mg tid 03/23/2018 > increased  to max of 300 tid 04/24/2018 > improved so increased to 300 qid 06/05/2018 > d/c'd around 02/2019 s flare     Past Surgical History:  Procedure Laterality Date   ABDOMINAL HYSTERECTOMY  1997   ANAL RECTAL MANOMETRY N/A 08/09/2019   Procedure: ANO RECTAL MANOMETRY;  Surgeon: Thornton Park, MD;  Location: [facility];  Service: Gastroenterology;  Laterality: N/A;   BREAST SURGERY  2002   left cyst removal    CHOLECYSTECTOMY N/A 02/12/2018   Procedure: LAPAROSCOPIC CHOLECYSTECTOMY;  Surgeon: Leighton Ruff, MD;  Location: [facility];  Service: General;  Laterality: N/A;   COLONOSCOPY     POLYPECTOMY      There were no vitals filed for this visit.   Subjective Assessment - 11/04/21 1014     Subjective Pt reports she needed to cancel previous appointment due to 4 hour long nose bleed and MD requesting no activity until this week to prevent risk or returning.    Pertinent History OA Lt knee, hip pain - has done PT for Lt knee and hip    Limitations Sitting;Lifting;Standing;Walking;House hold activities;Other (comment)    Patient Stated Goals understand where pain is coming from and prevent it in  future    Currently in Pain? No/denies    Pain Score 0-No pain    Pain Onset More than a month ago                               [facility] Adult PT Treatment/Exercise - 11/04/21 0001       Lumbar Exercises: Aerobic   Nustep L5 x 5' seat 9 new model, PT present to discuss symptoms      Shoulder Exercises: ROM/Strengthening   UBE (Upper Arm Bike) L2 2x2 fwd/bwd PT present to monitor tolerance      Manual Therapy   Manual Therapy Joint mobilization;Soft tissue mobilization    Joint Mobilization Rt thoracic rib springing and UPAs T7-T10    Soft tissue mobilization Rt thoracic paraspinals, Rt T8-T10 at rib 11-12, suction cup completed at Rt thoracic paraspinals as well. Also in supine rib mobility for expansion completed x7 with deep breathing with Rt rib cage demonstrated  decreased mobility initially and improved mobility reported by pt.                     PT Education - 11/04/21 1013     Education Details Pt educated on proper technqiue for all exercises and posture    Person(s) Educated Patient    Methods Explanation;Demonstration;Tactile cues;Verbal cues    Comprehension Returned demonstration;Verbalized understanding              PT Short Term Goals - 11/04/21 1055       PT SHORT TERM GOAL #1   Title be independent in intial HEP    Time 3    Period Weeks    Status On-going    Target Date 10/26/21      PT SHORT TERM GOAL #2   Title -      PT SHORT TERM GOAL #3   Title -               PT Long Term Goals - 10/05/21 1406       PT LONG TERM GOAL #1   Title Pt will demo full trunk Rt SB and Rt Rot with min or no pain in Rt lumbar spine    Time 6    Period Weeks    Status New    Target Date 11/16/21      PT LONG TERM GOAL #2   Title Normalize flexibility of proximal Rt hip flexor, quad and adductors to reduce anterior pull on pelvis.    Baseline -    Time 6    Period Weeks    Status New    Target Date 11/16/21      PT LONG TERM GOAL #3   Title Pt will achieve at least 4+/5 strength in Lt hip abd and quad for improved closed chain stability for stairs, gait and squatting    Baseline -    Time 6    Period Weeks    Status New    Target Date 11/16/21      PT LONG TERM GOAL #4   Title Pt will be able to demo core and trunk control for proper technique with bend/lift and carry to reduce risk of acute LBP with functional tasks.    Baseline -    Time 6    Period Weeks    Status New    Target Date 11/16/21      PT LONG TERM GOAL #  5   Title maintain FOTO score of 83% or better which was answered per current improved pain state.    Baseline 83% (goal 79%)    Time 6    Period Weeks    Status New    Target Date 11/16/21                   Plan - 11/04/21 1014     Clinical Impression Statement Pt  reports she was limited in the past couple of weeks due to having a 4 hour long noes bleed and needing to call EMS to have it stopped, MD had her on low activity restrictions until this week and cleared her to return. Pt session focused on manual work at [place] thoracic paraspinals and improving Rt rib mobility with therapist providing manual work and use of suction cup to improve with pt reporting feeling much better and "I feel like I have more room to breath" at end of session. Pt tolerated well without complaints of pain or discomfort. Pt would benefit from additional PT to further improve pain levels and mobility.    Personal Factors and Comorbidities Time since onset of injury/illness/exacerbation    Examination-Activity Limitations Locomotion Level;Transfers;Bend;Reach Overhead;Carry;Sit;Sleep;Squat;Stairs;Stand;Lift;Dressing    Examination-Participation Restrictions Cleaning;Laundry;Community Activity;Shop;Meal Prep    Stability/Clinical Decision Making Stable/Uncomplicated    Clinical Decision Making Low    Rehab Potential Excellent    PT Frequency 2x / week    PT Duration 6 weeks    PT Treatment/Interventions ADLs/Self Care Home Management;Moist Heat;Functional mobility training;Therapeutic activities;Therapeutic exercise;Neuromuscular re-education;Patient/family education;Manual techniques;Dry needling;Taping;Joint Manipulations;Spinal Manipulations    PT Next Visit Plan STM ribcage fascia and diaphragm next visit on Rt side    PT Home Exercise Plan Access Code: [email or web address]    Consulted and Agree with Plan of Care Patient             Patient will benefit from skilled therapeutic intervention in order to improve the following deficits and impairments:  Decreased strength, Impaired flexibility, Pain, Postural dysfunction, Increased muscle spasms  Visit Diagnosis: Right-sided low back pain without sciatica, unspecified chronicity  Cramp and spasm     Problem List Patient Active  Problem List   Diagnosis Date Noted   Hypertension    Hyperlipidemia    Arthritis    Primary osteoarthritis of left knee 03/11/2020   Primary osteoarthritis of left hip 11/26/2019   Constipation due to outlet dysfunction    Rectal pain    Dizziness 10/25/2018   Sinus bradycardia 10/25/2018   Atypical chest pain 10/25/2018   Dyslipidemia 10/25/2018   Chronic right shoulder pain 07/04/2018   Unilateral primary osteoarthritis, left knee 06/14/2018   Essential hypertension 04/28/2018   Hypertensive disorder 04/18/2018   Cholecystitis with cholelithiasis 02/11/2018   Multiple pulmonary nodules determined by computed tomography of lung 01/26/2018   Upper airway cough syndrome 01/25/2018    Stacy Gardner, PT, DPT 11/04/2308:57 AM   [facility] @ [address] [place], [place], [address] Phone: [phone]   Fax:  [phone]  Name: Nancy Christensen MRN: [id number] Date of Birth: Mar 22, 1951

## 2021-11-09 ENCOUNTER — Encounter: Payer: Medicare Other | Admitting: Physical Therapy

## 2021-11-10 ENCOUNTER — Other Ambulatory Visit: Payer: Self-pay

## 2021-11-10 ENCOUNTER — Ambulatory Visit: Payer: Medicare Other | Admitting: Physical Therapy

## 2021-11-10 ENCOUNTER — Encounter: Payer: Self-pay | Admitting: Physical Therapy

## 2021-11-10 DIAGNOSIS — R252 Cramp and spasm: Secondary | ICD-10-CM

## 2021-11-10 DIAGNOSIS — M25552 Pain in left hip: Secondary | ICD-10-CM | POA: Diagnosis not present

## 2021-11-10 DIAGNOSIS — M545 Low back pain, unspecified: Secondary | ICD-10-CM

## 2021-11-10 DIAGNOSIS — M6281 Muscle weakness (generalized): Secondary | ICD-10-CM

## 2021-11-10 NOTE — Therapy (Signed)
[facility] @ [address] [place], [place], [address] Phone: [phone]   Fax:  [phone]  Physical Therapy Treatment  Patient Details  Name: Nancy Christensen MRN: [id number] Date of Birth: [birth year] Referring Provider (PT): Martinique, Betty G, MD   Encounter Date: 11/10/2021   PT End of Session - 11/10/21 1107     Visit Number 5    Date for PT Re-Evaluation 11/16/21    Authorization Type Medicare Part A/B, KX at visit 15    Progress Note Due on Visit 10    PT Start Time 1103    PT Stop Time 1145    PT Time Calculation (min) 42 min    Activity Tolerance Patient tolerated treatment well    Behavior During Therapy [facility] for tasks assessed/performed             Past Medical History:  Diagnosis Date   Arthritis    left hip and knee   Atypical chest pain 10/25/2018   Cholecystitis with cholelithiasis 02/11/2018   Chronic right shoulder pain 07/04/2018   Constipation due to outlet dysfunction    Dizziness 10/25/2018   Dyslipidemia 10/25/2018   Essential hypertension 04/28/2018   Changed arb to CCB 04/24/2018 due to cough x one month trial    Hyperlipidemia    Hypertension    Hypertensive disorder 04/18/2018   Multiple pulmonary nodules determined by computed tomography of lung 01/26/2018   Passive smoke exp/ remote  Chest CT  09/24/17 MPN's  Largest 4 x 6 mm R laterally  > rec f/u 09/14/18 (reminder file)   As pt reports neg CT 2015 (not avail)  - ESR 01/25/2018 = 35  - CT chest 07/11/18 =  5 mm > no f/u needed    Primary osteoarthritis of left hip 11/26/2019   Primary osteoarthritis of left knee 03/11/2020   Rectal pain    Sinus bradycardia 10/25/2018   Unilateral primary osteoarthritis, left knee 06/14/2018   Upper airway cough syndrome 01/25/2018   Onset 1980 CT head 12/22/17 neg sinus dz FENO 01/25/2018  =   21 - Allergy profile 01/25/2018 >  Eos 0.1 /  IgE  17 RAST neg  - flare 02/12/18 p et  - gabapentin 100 mg tid 03/23/2018 >  increased to max of 300 tid 04/24/2018 > improved so increased to 300 qid 06/05/2018 > d/c'd around 02/2019 s flare     Past Surgical History:  Procedure Laterality Date   ABDOMINAL HYSTERECTOMY  1997   ANAL RECTAL MANOMETRY N/A 08/09/2019   Procedure: ANO RECTAL MANOMETRY;  Surgeon: Thornton Park, MD;  Location: [facility];  Service: Gastroenterology;  Laterality: N/A;   BREAST SURGERY  2002   left cyst removal    CHOLECYSTECTOMY N/A 02/12/2018   Procedure: LAPAROSCOPIC CHOLECYSTECTOMY;  Surgeon: Leighton Ruff, MD;  Location: [facility];  Service: General;  Laterality: N/A;   COLONOSCOPY     POLYPECTOMY      There were no vitals filed for this visit.   Subjective Assessment - 11/10/21 1109     Subjective I am feeling 20-25% better.  The hands-on work feels so good.  HEP going well.    Pertinent History OA Lt knee, hip pain - has done PT for Lt knee and hip    Limitations Sitting;Lifting;Standing;Walking;House hold activities;Other (comment)    Patient Stated Goals understand where pain is coming from and prevent it in future    Currently in Pain? Yes    Pain  Score 3     Pain Location Back    Pain Orientation Right    Pain Descriptors / Indicators Tightness    Pain Type Chronic pain    Pain Onset More than a month ago    Pain Frequency Intermittent    Aggravating Factors  stays stable 0-3/10, no triggers unless acutely flared up    Pain Relieving Factors PT, heat, meds                               [facility] Adult PT Treatment/Exercise - 11/10/21 0001       Exercises   Exercises Lumbar;Knee/Hip;Shoulder      Lumbar Exercises: Aerobic   Nustep L5 x 6' seat 9 new model, PT present to discuss symptoms      Lumbar Exercises: Standing   Other Standing Lumbar Exercises pallof press x 10, green, each way      Lumbar Exercises: Seated   Other Seated Lumbar Exercises on 1/2 BOSU on mat table reverse sit ups holding blue plyo ball at chest x 15 reps    Other  Seated Lumbar Exercises seated trunk diagonals with 5lb dumbbell chop and lift      Knee/Hip Exercises: Standing   Lateral Step Up Left;1 set;10 reps;Step Height: 6";Hand Hold: 1;Right    Walking with Sports Cord bwd x 5 reps 15lb pulleys      Shoulder Exercises: ROM/Strengthening   UBE (Upper Arm Bike) L2.5 2x2 fwd/bwd PT present to monitor tolerance      Manual Therapy   Manual Therapy Joint mobilization;Soft tissue mobilization    Joint Mobilization Rt thoracic rib springing and UPAs T7-T10    Soft tissue mobilization Rt thoracic paraspinals, Rt T8-T10 at rib 11-12, obliques on Rt, diaphragm anteriorly on Rt (good mobility)                       PT Short Term Goals - 11/04/21 1055       PT SHORT TERM GOAL #1   Title be independent in intial HEP    Time 3    Period Weeks    Status On-going    Target Date 10/26/21      PT SHORT TERM GOAL #2   Title -      PT SHORT TERM GOAL #3   Title -               PT Long Term Goals - 10/05/21 1406       PT LONG TERM GOAL #1   Title Pt will demo full trunk Rt SB and Rt Rot with min or no pain in Rt lumbar spine    Time 6    Period Weeks    Status New    Target Date 11/16/21      PT LONG TERM GOAL #2   Title Normalize flexibility of proximal Rt hip flexor, quad and adductors to reduce anterior pull on pelvis.    Baseline -    Time 6    Period Weeks    Status New    Target Date 11/16/21      PT LONG TERM GOAL #3   Title Pt will achieve at least 4+/5 strength in Lt hip abd and quad for improved closed chain stability for stairs, gait and squatting    Baseline -    Time 6    Period Weeks    Status New  Target Date 11/16/21      PT LONG TERM GOAL #4   Title Pt will be able to demo core and trunk control for proper technique with bend/lift and carry to reduce risk of acute LBP with functional tasks.    Baseline -    Time 6    Period Weeks    Status New    Target Date 11/16/21      PT LONG TERM GOAL  #5   Title maintain FOTO score of 83% or better which was answered per current improved pain state.    Baseline 83% (goal 79%)    Time 6    Period Weeks    Status New    Target Date 11/16/21                   Plan - 11/10/21 1328     Clinical Impression Statement Pt with pain rating 2-3/10 on arrival which reduced with targeted warm up and core/upper body/trunk ther ex today.  She continues to have some restriction along Rt ribcage and thoracic paraspinals which benefit from [facility] and joint mobs.  Dynamic rotation and resisted trunk ther ex added today with good response.  ERO next visit with plan to extend PT given missed 3 weeks due to medical set back.    PT Frequency 2x / week    PT Duration 6 weeks    PT Next Visit Plan ERO, continue dynamic core/trunk ther ex and aerobic ther ex, STM ribcage, thoracic paraspinals, obliques Rt side    PT Home Exercise Plan Access Code: [email or web address]    Consulted and Agree with Plan of Care Patient             Patient will benefit from skilled therapeutic intervention in order to improve the following deficits and impairments:     Visit Diagnosis: Right-sided low back pain without sciatica, unspecified chronicity  Cramp and spasm  Muscle weakness (generalized)     Problem List Patient Active Problem List   Diagnosis Date Noted   Hypertension    Hyperlipidemia    Arthritis    Primary osteoarthritis of left knee 03/11/2020   Primary osteoarthritis of left hip 11/26/2019   Constipation due to outlet dysfunction    Rectal pain    Dizziness 10/25/2018   Sinus bradycardia 10/25/2018   Atypical chest pain 10/25/2018   Dyslipidemia 10/25/2018   Chronic right shoulder pain 07/04/2018   Unilateral primary osteoarthritis, left knee 06/14/2018   Essential hypertension 04/28/2018   Hypertensive disorder 04/18/2018   Cholecystitis with cholelithiasis 02/11/2018   Multiple pulmonary nodules determined by computed tomography of lung  01/26/2018   Upper airway cough syndrome 01/25/2018    Baruch Merl, PT 11/10/21 1:32 PM   [facility] @ [address] [place], [place], [address] Phone: [phone]   Fax:  [phone]  Name: ZAKIYAH DIOP MRN: [id number] Date of Birth: Sep 29, 1950

## 2021-11-16 ENCOUNTER — Ambulatory Visit: Payer: Medicare Other | Admitting: Physical Therapy

## 2021-11-16 ENCOUNTER — Other Ambulatory Visit: Payer: Self-pay

## 2021-11-16 ENCOUNTER — Encounter: Payer: Self-pay | Admitting: Physical Therapy

## 2021-11-16 DIAGNOSIS — M545 Low back pain, unspecified: Secondary | ICD-10-CM | POA: Diagnosis not present

## 2021-11-16 DIAGNOSIS — M6281 Muscle weakness (generalized): Secondary | ICD-10-CM | POA: Diagnosis not present

## 2021-11-16 DIAGNOSIS — M25552 Pain in left hip: Secondary | ICD-10-CM | POA: Diagnosis not present

## 2021-11-16 DIAGNOSIS — R252 Cramp and spasm: Secondary | ICD-10-CM | POA: Diagnosis not present

## 2021-11-16 NOTE — Therapy (Signed)
[facility] @ [place], [place], [address] Phone: [phone]   Fax:  [phone]  Physical Therapy Treatment  Patient Details  Name: Nancy Christensen MRN: [id number] Date of Birth: 25-Sep-1951 Referring Provider (PT): Martinique, Betty G, MD   Encounter Date: 11/16/2021   PT End of Session - 11/16/21 1022     Visit Number 6    Date for PT Re-Evaluation 12/14/21    Authorization Type Medicare Part A/B, KX at visit 15    Progress Note Due on Visit 10    PT Start Time 1019    Activity Tolerance Patient tolerated treatment well    Behavior During Therapy [facility] for tasks assessed/performed             Past Medical History:  Diagnosis Date   Arthritis    left hip and knee   Atypical chest pain 10/25/2018   Cholecystitis with cholelithiasis 02/11/2018   Chronic right shoulder pain 07/04/2018   Constipation due to outlet dysfunction    Dizziness 10/25/2018   Dyslipidemia 10/25/2018   Essential hypertension 04/28/2018   Changed arb to CCB 04/24/2018 due to cough x one month trial    Hyperlipidemia    Hypertension    Hypertensive disorder 04/18/2018   Multiple pulmonary nodules determined by computed tomography of lung 01/26/2018   Passive smoke exp/ remote  Chest CT  09/24/17 MPN's  Largest 4 x 6 mm R laterally  > rec f/u 09/14/18 (reminder file)   As pt reports neg CT 2015 (not avail)  - ESR 01/25/2018 = 35  - CT chest 07/11/18 =  5 mm > no f/u needed    Primary osteoarthritis of left hip 11/26/2019   Primary osteoarthritis of left knee 03/11/2020   Rectal pain    Sinus bradycardia 10/25/2018   Unilateral primary osteoarthritis, left knee 06/14/2018   Upper airway cough syndrome 01/25/2018   Onset 1980 CT head 12/22/17 neg sinus dz FENO 01/25/2018  =   21 - Allergy profile 01/25/2018 >  Eos 0.1 /  IgE  17 RAST neg  - flare 02/12/18 p et  - gabapentin 100 mg tid 03/23/2018 > increased to max of 300 tid 04/24/2018 > improved so increased to  300 qid 06/05/2018 > d/c'd around 02/2019 s flare     Past Surgical History:  Procedure Laterality Date   ABDOMINAL HYSTERECTOMY  1997   ANAL RECTAL MANOMETRY N/A 08/09/2019   Procedure: ANO RECTAL MANOMETRY;  Surgeon: Thornton Park, MD;  Location: [facility];  Service: Gastroenterology;  Laterality: N/A;   BREAST SURGERY  2002   left cyst removal    CHOLECYSTECTOMY N/A 02/12/2018   Procedure: LAPAROSCOPIC CHOLECYSTECTOMY;  Surgeon: Leighton Ruff, MD;  Location: [facility];  Service: General;  Laterality: N/A;   COLONOSCOPY     POLYPECTOMY      There were no vitals filed for this visit.   Subjective Assessment - 11/16/21 1024     Subjective I am improving.  About 30% better.  I am getting stronger and having less pain but I did have the Lt hip give out on me 2x since last visit when walking.    Pertinent History OA Lt knee, hip pain - has done PT for Lt knee and hip    Limitations Sitting;Lifting;Standing;Walking;House hold activities;Other (comment)    Patient Stated Goals understand where pain is coming from and prevent it in future    Currently in Pain? Yes  Pain Score 1     Pain Location Back    Pain Orientation Right    Pain Descriptors / Indicators Tightness    Pain Type Chronic pain    Pain Onset More than a month ago    Pain Frequency Intermittent    Aggravating Factors  pain range has lowered to 0-2/10, when walking hip can give out    Pain Relieving Factors PT, heat, meds                [facility] PT Assessment - 11/16/21 0001       Assessment   Medical Diagnosis M54.50,G89.29 (ICD-10-CM) - Chronic right-sided low back pain without sciatica    Referring Provider (PT) Martinique, Betty G, MD    Onset Date/Surgical Date --   chronic x 1-2 years with acute flare ups   Next MD Visit as needed    Prior Therapy for Lt hip and knee      Observation/Other Assessments   Focus on Therapeutic Outcomes (FOTO)  83% (goal 79%)      Functional Tests   Functional tests  Single Leg Squat      Single Leg Stance   Comments mild Lt Trendelenburg, improving      Posture/Postural Control   Posture/Postural Control No significant limitations      AROM   Lumbar Flexion full    Lumbar Extension full    Lumbar - Right Side Bend WNL    Lumbar - Left Side Bend WNL    Lumbar - Right Rotation mild pinch Rt side end range    Lumbar - Left Rotation WNL      Strength   Overall Strength Comments Lt hip and knee      Flexibility   Quadriceps mild end range restriction Lt>Rt    Quadratus Lumborum WNL      Palpation   Palpation comment Rt obliuqes/QL at floating ribs, Lt glut min and med, mild tenderness                           [facility] Adult PT Treatment/Exercise - 11/16/21 0001       Exercises   Exercises Lumbar;Knee/Hip;Shoulder      Lumbar Exercises: Stretches   Other Lumbar Stretch Exercise seated rotation 2x10" each way      Lumbar Exercises: Aerobic   Nustep L5 x 6' seat 9 new model, PT present to discuss symptoms      Lumbar Exercises: Standing   Other Standing Lumbar Exercises 1/4 squat row bil UEs 5lb x 10      Lumbar Exercises: Seated   Other Seated Lumbar Exercises seated trunk diagonals with 5lb dumbbell chop and row with rotation      Knee/Hip Exercises: Machines for Strengthening   Total Gym Leg Press 2x20 reps 70lb bil LE, 35lb single leg Rt/Lt 2x10 each    Hip Cybex 25lb 1x10 flexion, extension, abd Rt/Lt each      Knee/Hip Exercises: Standing   Lateral Step Up Left;1 set;10 reps;Step Height: 6";Hand Hold: 1;Right    SLS Lt LE on foam, slow motion march Rt LE, focus on glut med on Lt, 5 reps    SLS with Vectors Lt SLS on 4" step, move Rt LE quick/small movements back/forth and side/side x 20 perturbations each way      Shoulder Exercises: Standing   Other Standing Exercises cross body punch with trunk rotation, 3lb bil dumbbells alt Rt/Lt x 20 reps  PT Short Term Goals -  11/16/21 1050       PT SHORT TERM GOAL #1   Title be independent in intial HEP    Status Achieved               PT Long Term Goals - 11/16/21 1051       PT LONG TERM GOAL #1   Title Pt will demo full trunk Rt SB and Rt Rot with min or no pain in Rt lumbar spine    Status Achieved      PT LONG TERM GOAL #2   Title Normalize flexibility of proximal Rt hip flexor, quad and adductors to reduce anterior pull on pelvis.    Baseline mild end range restriction Lt>Rt quad    Status On-going      PT LONG TERM GOAL #3   Title Pt will achieve at least 4+/5 strength in Lt hip abd and quad for improved closed chain stability for stairs, gait and squatting    Baseline ongoing closed chain Lt hip abductor weakness 4/5, able to correct but fatigues and returns to Trendelenburg    Status On-going      PT LONG TERM GOAL #4   Title Pt will be able to demo core and trunk control for proper technique with bend/lift and carry to reduce risk of acute LBP with functional tasks.    Baseline demo'd good 1/4 squat today with bil row, ready to progress to bend/lift/transfer    Status On-going      PT LONG TERM GOAL #5   Title maintain FOTO score of 83% or better which was answered per current improved pain state.    Baseline 83% (goal 79%), d/c'd FOTO    Status Achieved                   Plan - 11/16/21 1110     Clinical Impression Statement Pt reports 30% improvment since starting PT.  She has full trunk ROM and nearly full LE flexibility.  She reported Lt hip giving way 2x while walking since last here.  Knee strength has improved to 5/5 on Lt.  Hip abduction on Lt is 4/5 with weakness noted in closed chain but this is improving but quick to fatigue with closed chain ther ex.  Pt has mild tenderness remaining in Rt QL and obliques around floating ribs but mobility of thoracic spine has greatly improved.  Pt tolerates ther ex well within sessions.  She is compliant with HEP.  She demo'd  good body mechanics today with 1/4 squat and bent over row.  She will benefit from progression of functional body mechanics with bend/lift/transfer for heavier household tasks.  Pt missed several weeks of PT within last cert period due to ER visit/severe epitaxis and was told not to exercise following x 2 weeks to prevent recurrence.  PT recommends continue PT 2x/week x 4 more weeks to progress strength and stabilization of Lt hip and trunk.    Personal Factors and Comorbidities Time since onset of injury/illness/exacerbation    Examination-Activity Limitations Locomotion Level;Transfers;Bend;Reach Overhead;Carry;Sit;Sleep;Squat;Stairs;Stand;Lift;Dressing    Examination-Participation Restrictions Cleaning;Laundry;Community Activity;Shop;Meal Prep    Stability/Clinical Decision Making Stable/Uncomplicated    Clinical Decision Making Low    Rehab Potential Excellent    PT Frequency 2x / week    PT Duration 4 weeks    PT Treatment/Interventions ADLs/Self Care Home Management;Moist Heat;Functional mobility training;Therapeutic activities;Therapeutic exercise;Neuromuscular re-education;Patient/family education;Manual techniques;Dry needling;Taping;Joint Manipulations;Spinal Manipulations    PT Next Visit  Plan NuStep L5 x 6', UBE L2.5 2x2, Lt hip strength (lateral step ups, SLS with vectors), body mechanics bend/lift/transfer 10lb box from floor, hip cybex and leg press    PT Home Exercise Plan Access Code: [email or web address]    Consulted and Agree with Plan of Care Patient             Patient will benefit from skilled therapeutic intervention in order to improve the following deficits and impairments:  Decreased strength, Impaired flexibility, Pain, Postural dysfunction, Increased muscle spasms  Visit Diagnosis: Right-sided low back pain without sciatica, unspecified chronicity  Cramp and spasm  Muscle weakness (generalized)     Problem List Patient Active Problem List   Diagnosis Date Noted    Hypertension    Hyperlipidemia    Arthritis    Primary osteoarthritis of left knee 03/11/2020   Primary osteoarthritis of left hip 11/26/2019   Constipation due to outlet dysfunction    Rectal pain    Dizziness 10/25/2018   Sinus bradycardia 10/25/2018   Atypical chest pain 10/25/2018   Dyslipidemia 10/25/2018   Chronic right shoulder pain 07/04/2018   Unilateral primary osteoarthritis, left knee 06/14/2018   Essential hypertension 04/28/2018   Hypertensive disorder 04/18/2018   Cholecystitis with cholelithiasis 02/11/2018   Multiple pulmonary nodules determined by computed tomography of lung 01/26/2018   Upper airway cough syndrome 01/25/2018    Baruch Merl, PT 11/16/21 11:17 AM   [facility] @ [place], [place], [address] Phone: [phone]   Fax:  [phone]  Name: Nancy Christensen MRN: [id number] Date of Birth: 1951/07/30

## 2021-11-18 ENCOUNTER — Ambulatory Visit: Payer: Medicare Other | Admitting: Cardiology

## 2021-11-18 ENCOUNTER — Encounter: Payer: Medicare Other | Admitting: Physical Therapy

## 2021-11-18 DIAGNOSIS — Z20828 Contact with and (suspected) exposure to other viral communicable diseases: Secondary | ICD-10-CM | POA: Diagnosis not present

## 2021-11-23 ENCOUNTER — Other Ambulatory Visit: Payer: Self-pay

## 2021-11-23 ENCOUNTER — Ambulatory Visit: Payer: Medicare Other | Admitting: Physical Therapy

## 2021-11-23 ENCOUNTER — Encounter: Payer: Self-pay | Admitting: Physical Therapy

## 2021-11-23 DIAGNOSIS — M6281 Muscle weakness (generalized): Secondary | ICD-10-CM | POA: Diagnosis not present

## 2021-11-23 DIAGNOSIS — M25552 Pain in left hip: Secondary | ICD-10-CM | POA: Diagnosis not present

## 2021-11-23 DIAGNOSIS — R252 Cramp and spasm: Secondary | ICD-10-CM | POA: Diagnosis not present

## 2021-11-23 DIAGNOSIS — M545 Low back pain, unspecified: Secondary | ICD-10-CM

## 2021-11-23 NOTE — Therapy (Signed)
[facility] @ [place], [place], [address] Phone: [phone]   Fax:  [phone]  Physical Therapy Treatment  Patient Details  Name: Nancy Christensen MRN: [id number] Date of Birth: [birth year] Referring Provider (PT): Martinique, Betty G, MD   Encounter Date: 11/23/2021   PT End of Session - 11/23/21 1021     Visit Number 7    Date for PT Re-Evaluation 12/14/21    Authorization Type Medicare Part A/B, KX at visit 15    Progress Note Due on Visit 10    PT Start Time 1018    PT Stop Time 1059    PT Time Calculation (min) 41 min    Activity Tolerance Patient tolerated treatment well    Behavior During Therapy [facility] for tasks assessed/performed             Past Medical History:  Diagnosis Date   Arthritis    left hip and knee   Atypical chest pain 10/25/2018   Cholecystitis with cholelithiasis 02/11/2018   Chronic right shoulder pain 07/04/2018   Constipation due to outlet dysfunction    Dizziness 10/25/2018   Dyslipidemia 10/25/2018   Essential hypertension 04/28/2018   Changed arb to CCB 04/24/2018 due to cough x one month trial    Hyperlipidemia    Hypertension    Hypertensive disorder 04/18/2018   Multiple pulmonary nodules determined by computed tomography of lung 01/26/2018   Passive smoke exp/ remote  Chest CT  09/24/17 MPN's  Largest 4 x 6 mm R laterally  > rec f/u 09/14/18 (reminder file)   As pt reports neg CT 2015 (not avail)  - ESR 01/25/2018 = 35  - CT chest 07/11/18 =  5 mm > no f/u needed    Primary osteoarthritis of left hip 11/26/2019   Primary osteoarthritis of left knee 03/11/2020   Rectal pain    Sinus bradycardia 10/25/2018   Unilateral primary osteoarthritis, left knee 06/14/2018   Upper airway cough syndrome 01/25/2018   Onset 1980 CT head 12/22/17 neg sinus dz FENO 01/25/2018  =   21 - Allergy profile 01/25/2018 >  Eos 0.1 /  IgE  17 RAST neg  - flare 02/12/18 p et  - gabapentin 100 mg tid 03/23/2018 >  increased to max of 300 tid 04/24/2018 > improved so increased to 300 qid 06/05/2018 > d/c'd around 02/2019 s flare     Past Surgical History:  Procedure Laterality Date   ABDOMINAL HYSTERECTOMY  1997   ANAL RECTAL MANOMETRY N/A 08/09/2019   Procedure: ANO RECTAL MANOMETRY;  Surgeon: Thornton Park, MD;  Location: [facility];  Service: Gastroenterology;  Laterality: N/A;   BREAST SURGERY  2002   left cyst removal    CHOLECYSTECTOMY N/A 02/12/2018   Procedure: LAPAROSCOPIC CHOLECYSTECTOMY;  Surgeon: Leighton Ruff, MD;  Location: [facility];  Service: General;  Laterality: N/A;   COLONOSCOPY     POLYPECTOMY      There were no vitals filed for this visit.                      [facility] Adult PT Treatment/Exercise - 11/23/21 0001       Exercises   Exercises Shoulder;Lumbar;Knee/Hip      Lumbar Exercises: Aerobic   Nustep L5 x 6' seat 9 new model, PT present to discuss symptoms      Lumbar Exercises: Standing   Other Standing Lumbar Exercises plank series: chair and wall plank with  red ball on wall (d/c'd chair due to shoulder pain), move red ball Lt/Rt within plank, 2x30"    Other Standing Lumbar Exercises 1/4 squat row bil UEs 5lb 4x5 reps each squat      Knee/Hip Exercises: Machines for Strengthening   Total Gym Leg Press 1x20 70lb bil LEs, 35lb 1x10    Hip Cybex 25lb 1x10 flexion, extension, abd Rt/Lt each      Knee/Hip Exercises: Standing   Forward Lunges Limitations bwd lunges, slider x 5 each    Side Lunges Both;5 reps    Side Lunges Limitations slider      Shoulder Exercises: Seated   Row Strengthening;Both;20 reps;Theraband    Theraband Level (Shoulder Row) Level 4 (Blue)    Row Limitations seated on blue theraball      Shoulder Exercises: Standing   Other Standing Exercises standing on airex pad double green band pallof press x 15 each      Shoulder Exercises: ROM/Strengthening   UBE (Upper Arm Bike) L2.5 2x2 fwd/bwd PT present to monitor tolerance                        PT Short Term Goals - 11/16/21 1050       PT SHORT TERM GOAL #1   Title be independent in intial HEP    Status Achieved               PT Long Term Goals - 11/16/21 1051       PT LONG TERM GOAL #1   Title Pt will demo full trunk Rt SB and Rt Rot with min or no pain in Rt lumbar spine    Status Achieved      PT LONG TERM GOAL #2   Title Normalize flexibility of proximal Rt hip flexor, quad and adductors to reduce anterior pull on pelvis.    Baseline mild end range restriction Lt>Rt quad    Status On-going      PT LONG TERM GOAL #3   Title Pt will achieve at least 4+/5 strength in Lt hip abd and quad for improved closed chain stability for stairs, gait and squatting    Baseline ongoing closed chain Lt hip abductor weakness 4/5, able to correct but fatigues and returns to Trendelenburg    Status On-going      PT LONG TERM GOAL #4   Title Pt will be able to demo core and trunk control for proper technique with bend/lift and carry to reduce risk of acute LBP with functional tasks.    Baseline demo'd good 1/4 squat today with bil row, ready to progress to bend/lift/transfer    Status On-going      PT LONG TERM GOAL #5   Title maintain FOTO score of 83% or better which was answered per current improved pain state.    Baseline 83% (goal 79%), d/c'd FOTO    Status Achieved                   Plan - 11/23/21 1055     Clinical Impression Statement Pt arrived painfree for first time since starting PT.  She reports she feels same benefit of loosening up using ther ex as she used to with massage, indicating good prognosis for ongoing HEP and progress ther ex.  Pt was able to progress stabilization to include wall plank hands on red therapy ball and standing functional closed chain strength with slider lateral and posterior lunges.  She needs  intermittent cueing for squat form to avoid excessive lumbar lordosis.  Continue along POC.    PT  Frequency 2x / week    PT Duration 4 weeks    PT Treatment/Interventions ADLs/Self Care Home Management;Moist Heat;Functional mobility training;Therapeutic activities;Therapeutic exercise;Neuromuscular re-education;Patient/family education;Manual techniques;Dry needling;Taping;Joint Manipulations;Spinal Manipulations    PT Next Visit Plan did Pt look at equipment in fitness center where she lives, continue wall plank with red ball, slider lunge review, NuStep L5 x 6', UBE L2.5 2x2, Lt hip strength (lateral step ups, SLS with vectors), body mechanics bend/lift/transfer 10lb box from floor, hip cybex and leg press    PT Home Exercise Plan Access Code: [email or web address]    Consulted and Agree with Plan of Care Patient             Patient will benefit from skilled therapeutic intervention in order to improve the following deficits and impairments:     Visit Diagnosis: Right-sided low back pain without sciatica, unspecified chronicity  Cramp and spasm  Muscle weakness (generalized)  Pain in left hip     Problem List Patient Active Problem List   Diagnosis Date Noted   Hypertension    Hyperlipidemia    Arthritis    Primary osteoarthritis of left knee 03/11/2020   Primary osteoarthritis of left hip 11/26/2019   Constipation due to outlet dysfunction    Rectal pain    Dizziness 10/25/2018   Sinus bradycardia 10/25/2018   Atypical chest pain 10/25/2018   Dyslipidemia 10/25/2018   Chronic right shoulder pain 07/04/2018   Unilateral primary osteoarthritis, left knee 06/14/2018   Essential hypertension 04/28/2018   Hypertensive disorder 04/18/2018   Cholecystitis with cholelithiasis 02/11/2018   Multiple pulmonary nodules determined by computed tomography of lung 01/26/2018   Upper airway cough syndrome 01/25/2018    Baruch Merl, PT 11/23/21 11:00 AM   [facility] @ [place], [place], [address] Phone:  [phone]   Fax:  [phone]  Name: SIGNA CHEEK MRN: [id number] Date of Birth: October 08, 1950

## 2021-11-25 ENCOUNTER — Ambulatory Visit (INDEPENDENT_AMBULATORY_CARE_PROVIDER_SITE_OTHER): Payer: Medicare Other

## 2021-11-25 ENCOUNTER — Encounter: Payer: Medicare Other | Admitting: Physical Therapy

## 2021-11-25 VITALS — BP 117/72 | Ht 64.0 in | Wt 188.0 lb

## 2021-11-25 DIAGNOSIS — Z0000 Encounter for general adult medical examination without abnormal findings: Secondary | ICD-10-CM

## 2021-11-25 DIAGNOSIS — H40013 Open angle with borderline findings, low risk, bilateral: Secondary | ICD-10-CM | POA: Diagnosis not present

## 2021-11-25 DIAGNOSIS — H43393 Other vitreous opacities, bilateral: Secondary | ICD-10-CM | POA: Diagnosis not present

## 2021-11-25 NOTE — Patient Instructions (Addendum)
Nancy Christensen , Thank you for taking time to come for your Medicare Wellness Visit. I appreciate your ongoing commitment to your health goals. Please review the following plan we discussed and let me know if I can assist you in the future.   These are the goals we discussed:  Goals       Patient Stated (pt-stated)      I will continue walk 2-3 miles per day, and every other day do cardio workouts        This is a list of the screening recommended for you and due dates:  Health Maintenance  Topic Date Due   COVID-19 Vaccine (1) 12/11/2021*   Flu Shot  12/24/2021*   Zoster (Shingles) Vaccine (1 of 2) 02/25/2022*   Pneumonia Vaccine (1 - PCV) 11/26/2022*   Tetanus Vaccine  11/26/2022*   Hepatitis C Screening: USPSTF Recommendation to screen - Ages 18-79 yo.  11/26/2022*   Colon Cancer Screening  07/11/2022   Mammogram  08/26/2023   DEXA scan (bone density measurement)  Completed   HPV Vaccine  Aged Out  *Topic was postponed. The date shown is not the original due date.   Advanced directives: Yes Patient will bring copy  Conditions/risks identified: None  Next appointment: Follow up in one year for your annual wellness visit    Preventive Care 65 Years and Older, Female Preventive care refers to lifestyle choices and visits with your health care provider that can promote health and wellness. What does preventive care include? A yearly physical exam. This is also called an annual well check. Dental exams once or twice a year. Routine eye exams. Ask your health care provider how often you should have your eyes checked. Personal lifestyle choices, including: Daily care of your teeth and gums. Regular physical activity. Eating a healthy diet. Avoiding tobacco and drug use. Limiting alcohol use. Practicing safe sex. Taking low-dose aspirin every day. Taking vitamin and mineral supplements as recommended by your health care provider. What happens during an annual well  check? The services and screenings done by your health care provider during your annual well check will depend on your age, overall health, lifestyle risk factors, and family history of disease. Counseling  Your health care provider may ask you questions about your: Alcohol use. Tobacco use. Drug use. Emotional well-being. Home and relationship well-being. Sexual activity. Eating habits. History of falls. Memory and ability to understand (cognition). Work and work Statistician. Reproductive health. Screening  You may have the following tests or measurements: Height, weight, and BMI. Blood pressure. Lipid and cholesterol levels. These may be checked every 5 years, or more frequently if you are over 59 years old. Skin check. Lung cancer screening. You may have this screening every year starting at age 52 if you have a 30-pack-year history of smoking and currently smoke or have quit within the past 15 years. Fecal occult blood test (FOBT) of the stool. You may have this test every year starting at age 65. Flexible sigmoidoscopy or colonoscopy. You may have a sigmoidoscopy every 5 years or a colonoscopy every 10 years starting at age 47. Hepatitis C blood test. Hepatitis B blood test. Sexually transmitted disease (STD) testing. Diabetes screening. This is done by checking your blood sugar (glucose) after you have not eaten for a while (fasting). You may have this done every 1-3 years. Bone density scan. This is done to screen for osteoporosis. You may have this done starting at age 66. Mammogram. This may be done  every 1-2 years. Talk to your health care provider about how often you should have regular mammograms. Talk with your health care provider about your test results, treatment options, and if necessary, the need for more tests. Vaccines  Your health care provider may recommend certain vaccines, such as: Influenza vaccine. This is recommended every year. Tetanus, diphtheria, and  acellular pertussis (Tdap, Td) vaccine. You may need a Td booster every 10 years. Zoster vaccine. You may need this after age 15. Pneumococcal 13-valent conjugate (PCV13) vaccine. One dose is recommended after age 68. Pneumococcal polysaccharide (PPSV23) vaccine. One dose is recommended after age 49. Talk to your health care provider about which screenings and vaccines you need and how often you need them. This information is not intended to replace advice given to you by your health care provider. Make sure you discuss any questions you have with your health care provider. Document Released: 10/09/2015 Document Revised: 06/01/2016 Document Reviewed: 07/14/2015 Elsevier Interactive Patient Education  2017 [place] Prevention in the Home Falls can cause injuries. They can happen to people of all ages. There are many things you can do to make your home safe and to help prevent falls. What can I do on the outside of my home? Regularly fix the edges of walkways and driveways and fix any cracks. Remove anything that might make you trip as you walk through a door, such as a raised step or threshold. Trim any bushes or trees on the path to your home. Use bright outdoor lighting. Clear any walking paths of anything that might make someone trip, such as rocks or tools. Regularly check to see if handrails are loose or broken. Make sure that both sides of any steps have handrails. Any raised decks and porches should have guardrails on the edges. Have any leaves, snow, or ice cleared regularly. Use sand or salt on walking paths during winter. Clean up any spills in your garage right away. This includes oil or grease spills. What can I do in the bathroom? Use night lights. Install grab bars by the toilet and in the tub and shower. Do not use towel bars as grab bars. Use non-skid mats or decals in the tub or shower. If you need to sit down in the shower, use a plastic, non-slip stool. Keep  the floor dry. Clean up any water that spills on the floor as soon as it happens. Remove soap buildup in the tub or shower regularly. Attach bath mats securely with double-sided non-slip rug tape. Do not have throw rugs and other things on the floor that can make you trip. What can I do in the bedroom? Use night lights. Make sure that you have a light by your bed that is easy to reach. Do not use any sheets or blankets that are too big for your bed. They should not hang down onto the floor. Have a firm chair that has side arms. You can use this for support while you get dressed. Do not have throw rugs and other things on the floor that can make you trip. What can I do in the kitchen? Clean up any spills right away. Avoid walking on wet floors. Keep items that you use a lot in easy-to-reach places. If you need to reach something above you, use a strong step stool that has a grab bar. Keep electrical cords out of the way. Do not use floor polish or wax that makes floors slippery. If you must use wax, use  non-skid floor wax. Do not have throw rugs and other things on the floor that can make you trip. What can I do with my stairs? Do not leave any items on the stairs. Make sure that there are handrails on both sides of the stairs and use them. Fix handrails that are broken or loose. Make sure that handrails are as long as the stairways. Check any carpeting to make sure that it is firmly attached to the stairs. Fix any carpet that is loose or worn. Avoid having throw rugs at the top or bottom of the stairs. If you do have throw rugs, attach them to the floor with carpet tape. Make sure that you have a light switch at the top of the stairs and the bottom of the stairs. If you do not have them, ask someone to add them for you. What else can I do to help prevent falls? Wear shoes that: Do not have high heels. Have rubber bottoms. Are comfortable and fit you well. Are closed at the toe. Do not  wear sandals. If you use a stepladder: Make sure that it is fully opened. Do not climb a closed stepladder. Make sure that both sides of the stepladder are locked into place. Ask someone to hold it for you, if possible. Clearly mark and make sure that you can see: Any grab bars or handrails. First and last steps. Where the edge of each step is. Use tools that help you move around (mobility aids) if they are needed. These include: Canes. Walkers. Scooters. Crutches. Turn on the lights when you go into a dark area. Replace any light bulbs as soon as they burn out. Set up your furniture so you have a clear path. Avoid moving your furniture around. If any of your floors are uneven, fix them. If there are any pets around you, be aware of where they are. Review your medicines with your doctor. Some medicines can make you feel dizzy. This can increase your chance of falling. Ask your doctor what other things that you can do to help prevent falls. This information is not intended to replace advice given to you by your health care provider. Make sure you discuss any questions you have with your health care provider. Document Released: 07/09/2009 Document Revised: 02/18/2016 Document Reviewed: 10/17/2014 Elsevier Interactive Patient Education  2017 [employer].

## 2021-11-25 NOTE — Progress Notes (Signed)
Subjective:   Nancy Christensen is a 71 y.o. female who presents for Medicare Annual (Subsequent) preventive examination.  Review of Systems    Virtual Visit via Telephone Note  I connected with  Kym Groom on 11/25/21 at  8:15 AM EST by telephone and verified that I am speaking with the correct person using two identifiers.  Location: Patient: Home  Provider: Office Persons participating in the virtual visit: patient/Nurse Health Advisor   I discussed the limitations, risks, security and privacy concerns of performing an evaluation and management service by telephone and the availability of in person appointments. The patient expressed understanding and agreed to proceed.  Interactive audio and video telecommunications were attempted between this nurse and patient, however failed, due to patient having technical difficulties OR patient did not have access to video capability.  We continued and completed visit with audio only.  Some vital signs may be absent or patient reported.   Criselda Peaches, LPN  Cardiac Risk Factors include: advanced age (>75mn, >>57women);hypertension     Objective:    Today's Vitals   11/25/21 0836  BP: 117/72  Weight: 188 lb (85.3 kg)  Height: _0  (1.626 m)   Body mass index is 32.27 kg/m.  Advanced Directives 11/25/2021 10/19/2021 10/05/2021 06/19/2020 01/21/2020 10/22/2019 09/02/2018  Does Patient Have a Medical Advance Directive? _1  No;Yes Yes  Type of AParamedicof [place]lower Mound[place] will Living will;Healthcare Power of Attorney Living will;Healthcare Power of AP[place]Living will [place] will [place] will [place]ia FallsLiving will  Does patient want to make changes to medical advance directive? No - Patient declined No - Patient declined No - Patient declined No - Patient declined - No - Patient declined -   Copy of HMel[place] Villagein Chart? No - copy requested No - copy requested No - copy requested No - copy requested No - copy requested No - copy requested -    Current Medications (verified) Outpatient Encounter Medications as of 11/25/2021  Medication Sig   BILE SALTS-CAPS-CASC-PHENOLPTH PO Take by mouth daily as needed (for digestion).   cholecalciferol (VITAMIN D3) 25 MCG (1000 UT) tablet Take 1,000 Units by mouth daily.   ezetimibe (ZETIA) 10 MG tablet TAKE 1 TABLET BY MOUTH EVERY DAY (Patient taking differently: Take 10 mg by mouth daily.)   irbesartan (AVAPRO) 75 MG tablet TAKE 1/2 TABLETS BY MOUTH DAILY. (Patient taking differently: Take 75 mg by mouth daily.)   No facility-administered encounter medications on file as of 11/25/2021.    Allergies (verified) Patient has no known allergies.   History: Past Medical History:  Diagnosis Date   Arthritis    left hip and knee   Atypical chest pain 10/25/2018   Cholecystitis with cholelithiasis 02/11/2018   Chronic right shoulder pain 07/04/2018   Constipation due to outlet dysfunction    Dizziness 10/25/2018   Dyslipidemia 10/25/2018   Essential hypertension 04/28/2018   Changed arb to CCB 04/24/2018 due to cough x one month trial    Hyperlipidemia    Hypertension    Hypertensive disorder 04/18/2018   Multiple pulmonary nodules determined by computed tomography of lung 01/26/2018   Passive smoke exp/ remote  Chest CT  09/24/17 MPN's  Largest 4 x 6 mm R laterally  > rec f/u 09/14/18 (reminder file)   As pt reports neg CT 2015 (not avail)  - ESR 01/25/2018 = 35  -  CT chest 07/11/18 =  5 mm > no f/u needed    Primary osteoarthritis of left hip 11/26/2019   Primary osteoarthritis of left knee 03/11/2020   Rectal pain    Sinus bradycardia 10/25/2018   Unilateral primary osteoarthritis, left knee 06/14/2018   Upper airway cough syndrome 01/25/2018   Onset 1980 CT head 12/22/17 neg sinus dz FENO 01/25/2018  =   21 - Allergy profile 01/25/2018 >   Eos 0.1 /  IgE  17 RAST neg  - flare 02/12/18 p et  - gabapentin 100 mg tid 03/23/2018 > increased to max of 300 tid 04/24/2018 > improved so increased to 300 qid 06/05/2018 > d/c'd around 02/2019 s flare    Past Surgical History:  Procedure Laterality Date   ABDOMINAL HYSTERECTOMY  1997   ANAL RECTAL MANOMETRY N/A 08/09/2019   Procedure: ANO RECTAL MANOMETRY;  Surgeon: Thornton Park, MD;  Location: [facility];  Service: Gastroenterology;  Laterality: N/A;   BREAST SURGERY  2002   left cyst removal    CHOLECYSTECTOMY N/A 02/12/2018   Procedure: LAPAROSCOPIC CHOLECYSTECTOMY;  Surgeon: Leighton Ruff, MD;  Location: [facility];  Service: General;  Laterality: N/A;   COLONOSCOPY     POLYPECTOMY     Family History  Problem Relation Age of Onset   Diabetes Mother    Hypertension Mother    Thyroid disease Mother    Vascular Disease Mother    Lung cancer Father    Colon cancer Maternal Uncle    Colon polyps Neg Hx    Esophageal cancer Neg Hx    Stomach cancer Neg Hx    Rectal cancer Neg Hx    Breast cancer Neg Hx    Social History   Socioeconomic History   Marital status: Divorced    Spouse name: Not on file   Number of children: Not on file   Years of education: Not on file   Highest education level: Not on file  Occupational History   Not on file  Tobacco Use   Smoking status: Never   Smokeless tobacco: Never  Vaping Use   Vaping Use: Never used  Substance and Sexual Activity   Alcohol use: Never   Drug use: Not Currently   Sexual activity: Not on file  Other Topics Concern   Not on file  Social History Narrative   Not on file   Social Determinants of Health   Financial Resource Strain: Low Risk    Difficulty of Paying Living Expenses: Not hard at all  Food Insecurity: No Food Insecurity   Worried About Charity fundraiser in the Last Year: Never true   Ran Out of Food in the Last Year: Never true  Transportation Needs: No Transportation Needs   Lack of  Transportation (Medical): No   Lack of Transportation (Non-Medical): No  Physical Activity: Sufficiently Active   Days of Exercise per Week: 4 days   Minutes of Exercise per Session: 50 min  Stress: No Stress Concern Present   Feeling of Stress : Not at all  Social Connections: Moderately Integrated   Frequency of Communication with Friends and Family: More than three times a week   Frequency of Social Gatherings with Friends and Family: More than three times a week   Attends Religious Services: More than 4 times per year   Active Member of [employer] or Organizations: Yes   Attends Archivist Meetings: Patient refused   Marital Status: Divorced     Clinical Intake:  How often do you need to have someone help you when you read instructions, pamphlets, or other written materials from your doctor or pharmacy?: 1 - Never  Diabetic?  No   Activities of Daily Living In your present state of health, do you have any difficulty performing the following activities: 11/25/2021 11/25/2021  Hearing? N N  Vision? N N  Difficulty concentrating or making decisions? N N  Walking or climbing stairs? N N  Dressing or bathing? N N  Doing errands, shopping? N N  Preparing Food and eating ? N N  Using the Toilet? N N  In the past six months, have you accidently leaked urine? N N  Do you have problems with loss of bowel control? N N  Managing your Medications? N N  Managing your Finances? N N  Housekeeping or managing your Housekeeping? N N  Some recent data might be hidden    Patient Care Team: Billie Ruddy, MD as PCP - General (Family Medicine)  Indicate any recent Medical Services you may have received from other than [facility] providers in the past year (date may be approximate).     Assessment:   This is a routine wellness examination for [place].  Hearing/Vision screen Hearing Screening - Comments:: No difficulty hearing Vision Screening - Comments:: Wears glasses. Followed by  Dr Alois Cliche  Dietary issues and exercise activities discussed: Exercise limited by: None identified   Goals Addressed               This Visit's Progress     Patient Stated (pt-stated)        I will continue walk 2-3 miles per day, and every other day do cardio workouts       Depression Screen PHQ 2/9 Scores 11/25/2021 10/20/2021 09/22/2021 06/19/2020 06/19/2019 01/03/2018  PHQ - 2 Score 0 0 0 0 0 0  PHQ- 9 Score - - - 0 - -    Fall Risk Fall Risk  11/25/2021 11/25/2021 10/20/2021 07/09/2021 06/19/2020  Falls in the past year? 0 0 0 0 0  Number falls in past yr: 0 0 0 - 0  Injury with Fall? 0 0 0 - 0  Risk for fall due to : No Fall Risks - - - -  Follow up - - Falls evaluation completed - Falls evaluation completed;Falls prevention discussed    FALL RISK PREVENTION PERTAINING TO THE HOME:  Any stairs in or around the home? Yes  If so, are there any without handrails? No  Home free of loose throw rugs in walkways, pet beds, electrical cords, etc? Yes  Adequate lighting in your home to reduce risk of falls? Yes   ASSISTIVE DEVICES UTILIZED TO PREVENT FALLS:  Life alert? No Use of a cane, walker or w/c? No  Grab bars in the bathroom? No  Shower chair or bench in shower? No  Elevated toilet seat or a handicapped toilet? No   TIMED UP AND GO:  Was the test performed? No . Audio Visit  Cognitive Function:     6CIT Screen 11/25/2021  What Year? 0 points  What month? 0 points  What time? 0 points  Count back from 20 0 points  Months in reverse 0 points  Repeat phrase 0 points  Total Score 0    Immunizations  There is no immunization history on file for this patient.  TDAP status: Due, Education has been provided regarding the importance of this vaccine. Advised may receive this vaccine at local  pharmacy or Health Dept. Aware to provide a copy of the vaccination record if obtained from local pharmacy or Health Dept. Verbalized acceptance and understanding.  Flu  Vaccine status: Declined, Education has been provided regarding the importance of this vaccine but patient still declined. Advised may receive this vaccine at local pharmacy or Health Dept. Aware to provide a copy of the vaccination record if obtained from local pharmacy or Health Dept. Verbalized acceptance and understanding.  Pneumococcal vaccine status: Due, Education has been provided regarding the importance of this vaccine. Advised may receive this vaccine at local pharmacy or Health Dept. Aware to provide a copy of the vaccination record if obtained from local pharmacy or Health Dept. Verbalized acceptance and understanding.  Covid-19 vaccine status: Declined, Education has been provided regarding the importance of this vaccine but patient still declined. Advised may receive this vaccine at local pharmacy or Health Dept.or vaccine clinic. Aware to provide a copy of the vaccination record if obtained from local pharmacy or Health Dept. Verbalized acceptance and understanding.  Qualifies for Shingles Vaccine? Yes Zostavax completed No   Shingrix Completed?: No.    Education has been provided regarding the importance of this vaccine. Patient has been advised to call insurance company to determine out of pocket expense if they have not yet received this vaccine. Advised may also receive vaccine at local pharmacy or Health Dept. Verbalized acceptance and understanding.  Screening Tests Health Maintenance  Topic Date Due   COVID-19 Vaccine (1) 12/11/2021 (Originally 11/03/1951)   INFLUENZA VACCINE  12/24/2021 (Originally 04/26/2021)   Zoster Vaccines- Shingrix (1 of 2) 02/25/2022 (Originally 05/03/2000)   Pneumonia Vaccine 61+ Years old (1 - PCV) 11/26/2022 (Originally 05/04/2015)   TETANUS/TDAP  11/26/2022 (Originally 05/03/1970)   Hepatitis C Screening  11/26/2022 (Originally 05/03/1969)   COLONOSCOPY (Pts 45-36yr Insurance coverage will need to be confirmed)  07/11/2022   MAMMOGRAM  08/26/2023   DEXA  SCAN  Completed   HPV VACCINES  Aged Out    Health Maintenance  There are no preventive care reminders to display for this patient.   Colorectal cancer screening: Type of screening: Colonoscopy. Completed 07/12/19. Repeat every 3 years  Mammogram status: Completed 08/25/21. Repeat every year  Bone Density status: Completed 04/29/21. Results reflect: Bone density results: OSTEOPOROSIS. Repeat every 2 years.  Lung Cancer Screening: (Low Dose CT Chest recommended if Age 72-80years, 30 pack-year currently smoking OR have quit w/in 15years.) does not qualify.  Additional Screening:  Hepatitis C Screening: does qualify; Completed Patient Deferred  Vision Screening: Recommended annual ophthalmology exams for early detection of glaucoma and other disorders of the eye. Is the patient up to date with their annual eye exam?  Yes  Who is the provider or what is the name of the office in which the patient attends annual eye exams? Dr PAlois ClicheIf pt is not established with a provider, would they like to be referred to a provider to establish care? No .   Dental Screening: Recommended annual dental exams for proper oral hygiene  Community Resource Referral / Chronic Care Management:  CRR required this visit?  No   CCM required this visit?  No      Plan:     I have personally reviewed and noted the following in the patients chart:   Medical and social history Use of alcohol, tobacco or illicit drugs  Current medications and supplements including opioid prescriptions.  Functional ability and status Nutritional status Physical activity Advanced directives List of  other physicians Hospitalizations, surgeries, and ER visits in previous 12 months Vitals Screenings to include cognitive, depression, and falls Referrals and appointments  In addition, I have reviewed and discussed with patient certain preventive protocols, quality metrics, and best practice recommendations. A written  personalized care plan for preventive services as well as general preventive health recommendations were provided to patient.     ARLETH MCCULLAR, LPN   11/28/7423   Nurse Notes: None

## 2021-11-30 ENCOUNTER — Ambulatory Visit: Payer: Medicare Other | Attending: Family Medicine | Admitting: Physical Therapy

## 2021-11-30 ENCOUNTER — Encounter: Payer: Self-pay | Admitting: Physical Therapy

## 2021-11-30 ENCOUNTER — Other Ambulatory Visit: Payer: Self-pay

## 2021-11-30 DIAGNOSIS — R252 Cramp and spasm: Secondary | ICD-10-CM | POA: Diagnosis not present

## 2021-11-30 DIAGNOSIS — M545 Low back pain, unspecified: Secondary | ICD-10-CM | POA: Insufficient documentation

## 2021-11-30 DIAGNOSIS — M6281 Muscle weakness (generalized): Secondary | ICD-10-CM | POA: Insufficient documentation

## 2021-11-30 NOTE — Therapy (Signed)
[facility] @ [place], [place], [address] Phone: [phone]   Fax:  [phone]  Physical Therapy Treatment  Patient Details  Name: Nancy Christensen MRN: [id number] Date of Birth: [birth year] Referring Provider (PT): Martinique, Betty G, MD   Encounter Date: 11/30/2021   PT End of Session - 11/30/21 1018     Visit Number 8    Date for PT Re-Evaluation 12/14/21    Authorization Type Medicare Part A/B, KX at visit 15    Progress Note Due on Visit 10    PT Start Time 1016    PT Stop Time 1055    PT Time Calculation (min) 39 min    Activity Tolerance Patient tolerated treatment well    Behavior During Therapy [facility] for tasks assessed/performed             Past Medical History:  Diagnosis Date   Arthritis    left hip and knee   Atypical chest pain 10/25/2018   Cholecystitis with cholelithiasis 02/11/2018   Chronic right shoulder pain 07/04/2018   Constipation due to outlet dysfunction    Dizziness 10/25/2018   Dyslipidemia 10/25/2018   Essential hypertension 04/28/2018   Changed arb to CCB 04/24/2018 due to cough x one month trial    Hyperlipidemia    Hypertension    Hypertensive disorder 04/18/2018   Multiple pulmonary nodules determined by computed tomography of lung 01/26/2018   Passive smoke exp/ remote  Chest CT  09/24/17 MPN's  Largest 4 x 6 mm R laterally  > rec f/u 09/14/18 (reminder file)   As pt reports neg CT 2015 (not avail)  - ESR 01/25/2018 = 35  - CT chest 07/11/18 =  5 mm > no f/u needed    Primary osteoarthritis of left hip 11/26/2019   Primary osteoarthritis of left knee 03/11/2020   Rectal pain    Sinus bradycardia 10/25/2018   Unilateral primary osteoarthritis, left knee 06/14/2018   Upper airway cough syndrome 01/25/2018   Onset 1980 CT head 12/22/17 neg sinus dz FENO 01/25/2018  =   21 - Allergy profile 01/25/2018 >  Eos 0.1 /  IgE  17 RAST neg  - flare 02/12/18 p et  - gabapentin 100 mg tid 03/23/2018 > increased  to max of 300 tid 04/24/2018 > improved so increased to 300 qid 06/05/2018 > d/c'd around 02/2019 s flare     Past Surgical History:  Procedure Laterality Date   ABDOMINAL HYSTERECTOMY  1997   ANAL RECTAL MANOMETRY N/A 08/09/2019   Procedure: ANO RECTAL MANOMETRY;  Surgeon: Thornton Park, MD;  Location: [facility];  Service: Gastroenterology;  Laterality: N/A;   BREAST SURGERY  2002   left cyst removal    CHOLECYSTECTOMY N/A 02/12/2018   Procedure: LAPAROSCOPIC CHOLECYSTECTOMY;  Surgeon: Leighton Ruff, MD;  Location: [facility];  Service: General;  Laterality: N/A;   COLONOSCOPY     POLYPECTOMY      There were no vitals filed for this visit.   Subjective Assessment - 11/30/21 1019     Subjective I have a catch in my Rt low back.  Started yesterday when I went to get up after being seated reading.  Was a 4/10 yesterday and today down to 2-3/10.  Worse when I try to stand more upright.    Pertinent History OA Lt knee, hip pain - has done PT for Lt knee and hip    Limitations Sitting;Lifting;Standing;Walking;House hold activities;Other (comment)  Patient Stated Goals understand where pain is coming from and prevent it in future    Currently in Pain? Yes    Pain Score 3     Pain Location Back    Pain Orientation Right;Mid;Lower    Pain Descriptors / Indicators Tightness;Sharp    Pain Type Chronic pain                               [facility] Adult PT Treatment/Exercise - 11/30/21 0001       Exercises   Exercises Shoulder;Lumbar;Knee/Hip      Lumbar Exercises: Stretches   Other Lumbar Stretch Exercise 3-way seated ball rollouts trunk flexion and flexion/SB combo      Lumbar Exercises: Aerobic   Nustep L5 x 5' seat 8 new model, PT present to discuss symptoms and moitor      Lumbar Exercises: Standing   Other Standing Lumbar Exercises wall plank forearms on red ball move ball up/down/Lt/Rt x 20 each      Knee/Hip Exercises: Machines for Strengthening   Total  Gym Leg Press 1x20 70lb bil LEs, 35lb 1x10    Hip Cybex 30lb 1x10 each: abd, ext, flexion      Knee/Hip Exercises: Standing   Lateral Step Up Both;1 set;Hand Hold: 0;Step Height: 6"    Lateral Step Up Limitations x12 each      Shoulder Exercises: Standing   Extension Strengthening;20 reps;Theraband    Theraband Level (Shoulder Extension) Level 2 (Red)    Row Strengthening;Both;Theraband;15 reps    Theraband Level (Shoulder Row) Level 4 (Blue)      Manual Therapy   Manual Therapy Soft tissue mobilization    Soft tissue mobilization Rt QL, lower thoracic and upper lumbar paraspinals, prone                       PT Short Term Goals - 11/16/21 1050       PT SHORT TERM GOAL #1   Title be independent in intial HEP    Status Achieved               PT Long Term Goals - 11/16/21 1051       PT LONG TERM GOAL #1   Title Pt will demo full trunk Rt SB and Rt Rot with min or no pain in Rt lumbar spine    Status Achieved      PT LONG TERM GOAL #2   Title Normalize flexibility of proximal Rt hip flexor, quad and adductors to reduce anterior pull on pelvis.    Baseline mild end range restriction Lt>Rt quad    Status On-going      PT LONG TERM GOAL #3   Title Pt will achieve at least 4+/5 strength in Lt hip abd and quad for improved closed chain stability for stairs, gait and squatting    Baseline ongoing closed chain Lt hip abductor weakness 4/5, able to correct but fatigues and returns to Trendelenburg    Status On-going      PT LONG TERM GOAL #4   Title Pt will be able to demo core and trunk control for proper technique with bend/lift and carry to reduce risk of acute LBP with functional tasks.    Baseline demo'd good 1/4 squat today with bil row, ready to progress to bend/lift/transfer    Status On-going      PT LONG TERM GOAL #5   Title maintain  FOTO score of 83% or better which was answered per current improved pain state.    Baseline 83% (goal 79%), d/c'd  FOTO    Status Achieved                   Plan - 11/30/21 1022     Clinical Impression Statement Pt experienced a sharp onset of pain and spasm yesterday in Rt lumbar soft tissues when rising from sitting which has reduced to tight ache today. Pain was worse with attempts to stand tall.  Warm up, light stretching and neutral spine UE/LE strengthening continued to improve Pt's pain today.  PT performed STM to Rt lower thoracic and upper lumbar paraspinals and Rt QL given remaining sublte spasm present end of session.  Continue along POC.    Rehab Potential Excellent    PT Duration 4 weeks    PT Next Visit Plan improved LBP since last time?  did Pt look at equipment in fitness center where she lives, continue wall plank with red ball, slider lunge review, NuStep L5 x 6', UBE L2.5 2x2, Lt hip strength (lateral step ups, SLS with vectors), body mechanics bend/lift/transfer 10lb box from floor, hip cybex and leg press    PT Home Exercise Plan Access Code: [email or web address]    Consulted and Agree with Plan of Care Patient             Patient will benefit from skilled therapeutic intervention in order to improve the following deficits and impairments:     Visit Diagnosis: Right-sided low back pain without sciatica, unspecified chronicity  Cramp and spasm  Muscle weakness (generalized)     Problem List Patient Active Problem List   Diagnosis Date Noted   Hypertension    Hyperlipidemia    Arthritis    Primary osteoarthritis of left knee 03/11/2020   Primary osteoarthritis of left hip 11/26/2019   Constipation due to outlet dysfunction    Rectal pain    Dizziness 10/25/2018   Sinus bradycardia 10/25/2018   Atypical chest pain 10/25/2018   Dyslipidemia 10/25/2018   Chronic right shoulder pain 07/04/2018   Unilateral primary osteoarthritis, left knee 06/14/2018   Essential hypertension 04/28/2018   Hypertensive disorder 04/18/2018   Cholecystitis with cholelithiasis 02/11/2018    Multiple pulmonary nodules determined by computed tomography of lung 01/26/2018   Upper airway cough syndrome 01/25/2018    Baruch Merl, PT 11/30/21 10:54 AM   [facility] @ [place] [place], [place], [address] Phone: [phone]   Fax:  [phone]  Name: Nancy Christensen MRN: [id number] Date of Birth: 1951-02-10

## 2021-12-02 ENCOUNTER — Encounter: Payer: Medicare Other | Admitting: Physical Therapy

## 2021-12-09 ENCOUNTER — Other Ambulatory Visit: Payer: Self-pay

## 2021-12-09 ENCOUNTER — Ambulatory Visit: Payer: Medicare Other | Admitting: Physical Therapy

## 2021-12-09 DIAGNOSIS — M6281 Muscle weakness (generalized): Secondary | ICD-10-CM

## 2021-12-09 DIAGNOSIS — M545 Low back pain, unspecified: Secondary | ICD-10-CM | POA: Diagnosis not present

## 2021-12-09 DIAGNOSIS — R252 Cramp and spasm: Secondary | ICD-10-CM

## 2021-12-09 NOTE — Therapy (Signed)
[facility] ?[facility] @ [address] ?[place], [place], [address] ?Phone: [phone]   Fax:  [phone] ? ?Physical Therapy Treatment ? ?Patient Details  ?Name: Nancy Christensen ?MRN: [id number] ?Date of Birth: 1951/07/14 ?Referring Provider (PT): Martinique, Betty G, MD ? ? ?Encounter Date: 12/09/2021 ? ? PT End of Session - 12/09/21 1021   ? ? Visit Number 9   ? Date for PT Re-Evaluation 12/14/21   ? Authorization Type Medicare Part A/B, KX at visit 15   ? Progress Note Due on Visit 10   ? PT Start Time 1015   ? PT Stop Time 1058   ? PT Time Calculation (min) 43 min   ? Activity Tolerance Patient tolerated treatment well   ? ?  ?  ? ?  ? ? ?Past Medical History:  ?Diagnosis Date  ? Arthritis   ? left hip and knee  ? Atypical chest pain 10/25/2018  ? Cholecystitis with cholelithiasis 02/11/2018  ? Chronic right shoulder pain 07/04/2018  ? Constipation due to outlet dysfunction   ? Dizziness 10/25/2018  ? Dyslipidemia 10/25/2018  ? Essential hypertension 04/28/2018  ? Changed arb to CCB 04/24/2018 due to cough x one month trial   ? Hyperlipidemia   ? Hypertension   ? Hypertensive disorder 04/18/2018  ? Multiple pulmonary nodules determined by computed tomography of lung 01/26/2018  ? Passive smoke exp/ remote  Chest CT  09/24/17 MPN's  Largest 4 x 6 mm R laterally  > rec f/u 09/14/18 (reminder file)   As pt reports neg CT 2015 (not avail)  - ESR 01/25/2018 = 35  - CT chest 07/11/18 =  5 mm > no f/u needed   ? Primary osteoarthritis of left hip 11/26/2019  ? Primary osteoarthritis of left knee 03/11/2020  ? Rectal pain   ? Sinus bradycardia 10/25/2018  ? Unilateral primary osteoarthritis, left knee 06/14/2018  ? Upper airway cough syndrome 01/25/2018  ? Onset 1980 CT head 12/22/17 neg sinus dz FENO 01/25/2018  =   21 - Allergy profile 01/25/2018 >  Eos 0.1 /  IgE  17 RAST neg  - flare 02/12/18 p et  - gabapentin 100 mg tid 03/23/2018 > increased to max of 300 tid 04/24/2018 > improved so increased to 300  qid 06/05/2018 > d/c'd around 02/2019 s flare   ? ? ?Past Surgical History:  ?Procedure Laterality Date  ? ABDOMINAL HYSTERECTOMY  1997  ? ANAL RECTAL MANOMETRY N/A 08/09/2019  ? Procedure: ANO RECTAL MANOMETRY;  Surgeon: Thornton Park, MD;  Location: [facility];  Service: Gastroenterology;  Laterality: N/A;  ? BREAST SURGERY  2002  ? left cyst removal   ? CHOLECYSTECTOMY N/A 02/12/2018  ? Procedure: LAPAROSCOPIC CHOLECYSTECTOMY;  Surgeon: Leighton Ruff, MD;  Location: [facility];  Service: General;  Laterality: N/A;  ? COLONOSCOPY    ? POLYPECTOMY    ? ? ?There were no vitals filed for this visit. ? ? Subjective Assessment - 12/09/21 1023   ? ? Subjective I've been doing fine. I feel better after PT.    I brought pictures of the equipment at my fitness center: kettlebells, elliptical, BOSU, balls, knee extension machine, cable pull, recumbent bike.   ? Pertinent History OA Lt knee, hip pain - has done PT for Lt knee and hip   ? Limitations Sitting;Lifting;Standing;Walking;House hold activities;Other (comment)   ? Patient Stated Goals understand where pain is coming from and prevent it in future   ? Currently  in Pain? No/denies   ? Pain Score 0-No pain   ? Pain Location Back   ? ?  ?  ? ?  ? ? ? ? ? ? ? ? ? ? ? ? ? ? ? ? ? ? ? ? [facility] Adult PT Treatment/Exercise - 12/09/21 0001   ? ?  ? Lumbar Exercises: Aerobic  ? Nustep L2 x 5' seat 82 old model, PT present to discuss symptoms and moitor   ?  ? Lumbar Exercises: Standing  ? Lifting Limitations hip hinge with golf club; 10# kettlebell dead lifts 15x   ? Other Standing Lumbar Exercises snatch and press overhead 5# kettlebell   ? Other Standing Lumbar Exercises standing cable pull downs 25# 20x   ?  ? Lumbar Exercises: Seated  ? Sit to Stand Limitations with 10# kettlebell 15x   ? Other Seated Lumbar Exercises discussion of ex machines at her fitness center   ?  ? Knee/Hip Exercises: Machines for Strengthening  ? Total Gym Leg Press bil 80# 20x; 40# single leg 20x each  leg   ? ?  ?  ? ?  ? ? ? ? ? ? ? ? ? ? ? ? PT Short Term Goals - 11/16/21 1050   ? ?  ? PT SHORT TERM GOAL #1  ? Title be independent in intial HEP   ? Status Achieved   ? ?  ?  ? ?  ? ? ? ? PT Long Term Goals - 11/16/21 1051   ? ?  ? PT LONG TERM GOAL #1  ? Title Pt will demo full trunk Rt SB and Rt Rot with min or no pain in Rt lumbar spine   ? Status Achieved   ?  ? PT LONG TERM GOAL #2  ? Title Normalize flexibility of proximal Rt hip flexor, quad and adductors to reduce anterior pull on pelvis.   ? Baseline mild end range restriction Lt>Rt quad   ? Status On-going   ?  ? PT LONG TERM GOAL #3  ? Title Pt will achieve at least 4+/5 strength in Lt hip abd and quad for improved closed chain stability for stairs, gait and squatting   ? Baseline ongoing closed chain Lt hip abductor weakness 4/5, able to correct but fatigues and returns to Trendelenburg   ? Status On-going   ?  ? PT LONG TERM GOAL #4  ? Title Pt will be able to demo core and trunk control for proper technique with bend/lift and carry to reduce risk of acute LBP with functional tasks.   ? Baseline demo'd good 1/4 squat today with bil row, ready to progress to bend/lift/transfer   ? Status On-going   ?  ? PT LONG TERM GOAL #5  ? Title maintain FOTO score of 83% or better which was answered per current improved pain state.   ? Baseline 83% (goal 79%), d/c'd FOTO   ? Status Achieved   ? ?  ?  ? ?  ? ? ? ? ? ? ? ? Plan - 12/09/21 1238   ? ? Clinical Impression Statement Discussed equipment available to her at her fitness center as well as ex's with weights she has at home.  Very receptive to hip hinging technique and initiated dead lifts and snatch/press with pt demonstrating excellent technique.  She has no pain during treatment session.   ? Examination-Activity Limitations Locomotion Level;Transfers;Bend;Reach Overhead;Carry;Sit;Sleep;Squat;Stairs;Stand;Lift;Dressing   ? Rehab Potential Excellent   ? PT Frequency  2x / week   ? PT Duration 4 weeks   ? PT  Treatment/Interventions ADLs/Self Care Home Management;Moist Heat;Functional mobility training;Therapeutic activities;Therapeutic exercise;Neuromuscular re-education;Patient/family education;Manual techniques;Dry needling;Taping;Joint Manipulations;Spinal Manipulations   ? PT Next Visit Plan check progress toward goals next visit for ERO vs. discharge;  review dead lift as needed;  fitness center where she lives, continue wall plank with red ball, slider lunge review, NuStep L5 x 6', UBE L2.5 2x2, Lt hip strength (lateral step ups, SLS with vectors), body mechanics bend/lift/transfer 10lb box from floor, hip cybex and leg press   ? PT Home Exercise Plan Access Code: [email or web address]   ? ?  ?  ? ?  ? ? ?Patient will benefit from skilled therapeutic intervention in order to improve the following deficits and impairments:  Decreased strength, Impaired flexibility, Pain, Postural dysfunction, Increased muscle spasms ? ?Visit Diagnosis: ?Right-sided low back pain without sciatica, unspecified chronicity ? ?Cramp and spasm ? ?Muscle weakness (generalized) ? ? ? ? ?Problem List ?Patient Active Problem List  ? Diagnosis Date Noted  ? Hypertension   ? Hyperlipidemia   ? Arthritis   ? Primary osteoarthritis of left knee 03/11/2020  ? Primary osteoarthritis of left hip 11/26/2019  ? Constipation due to outlet dysfunction   ? Rectal pain   ? Dizziness 10/25/2018  ? Sinus bradycardia 10/25/2018  ? Atypical chest pain 10/25/2018  ? Dyslipidemia 10/25/2018  ? Chronic right shoulder pain 07/04/2018  ? Unilateral primary osteoarthritis, left knee 06/14/2018  ? Essential hypertension 04/28/2018  ? Hypertensive disorder 04/18/2018  ? Cholecystitis with cholelithiasis 02/11/2018  ? Multiple pulmonary nodules determined by computed tomography of lung 01/26/2018  ? Upper airway cough syndrome 01/25/2018  ? ?Ruben Im, PT ?12/09/21 12:43 PM ?Phone: [phone] ?Fax: [phone]  ?Alvera Singh, PT ?12/09/2021, 12:43 PM ? ?[facility] ?[facility] @ [place] ?[place], [place], [address] ?Phone: [phone]   Fax:  [phone] ? ?Name: TIFFINIE CAILLIER ?MRN: [id number] ?Date of Birth: [birth year]

## 2021-12-11 DIAGNOSIS — Z20822 Contact with and (suspected) exposure to covid-19: Secondary | ICD-10-CM | POA: Diagnosis not present

## 2021-12-14 ENCOUNTER — Ambulatory Visit: Payer: Medicare Other | Admitting: Physical Therapy

## 2021-12-14 ENCOUNTER — Other Ambulatory Visit: Payer: Self-pay

## 2021-12-14 ENCOUNTER — Ambulatory Visit (INDEPENDENT_AMBULATORY_CARE_PROVIDER_SITE_OTHER): Payer: Medicare Other | Admitting: Cardiology

## 2021-12-14 VITALS — BP 176/100 | HR 55 | Ht 64.5 in | Wt 203.0 lb

## 2021-12-14 DIAGNOSIS — R252 Cramp and spasm: Secondary | ICD-10-CM | POA: Diagnosis not present

## 2021-12-14 DIAGNOSIS — M545 Low back pain, unspecified: Secondary | ICD-10-CM

## 2021-12-14 DIAGNOSIS — M6281 Muscle weakness (generalized): Secondary | ICD-10-CM

## 2021-12-14 DIAGNOSIS — I10 Essential (primary) hypertension: Secondary | ICD-10-CM

## 2021-12-14 DIAGNOSIS — R001 Bradycardia, unspecified: Secondary | ICD-10-CM | POA: Diagnosis not present

## 2021-12-14 DIAGNOSIS — E785 Hyperlipidemia, unspecified: Secondary | ICD-10-CM

## 2021-12-14 NOTE — Addendum Note (Signed)
Addended by: Edwyna Shell I on: 12/14/2021 04:25 PM ? ? Modules accepted: Orders ? ?

## 2021-12-14 NOTE — Patient Instructions (Signed)

## 2021-12-14 NOTE — Progress Notes (Signed)
?Cardiology Office Note:   ? ?Date:  12/14/2021  ? ?ID:  Kym Groom, DOB 18-Oct-1950, MRN [id number] ? ?PCP:  Billie Ruddy, MD  ?Cardiologist:  Jenne Campus, MD   ? ?Referring MD: Billie Ruddy, MD  ? ?Chief Complaint  ?Patient presents with  ? Follow-up  ? ? ?History of Present Illness:   ? ?Nancy Christensen is a 71 y.o. female with past medical history significant for sinus bradycardia she did wear a monitor which showed minimal heart rate of 31 average 51 but she is being completely asymptomatic.  There is no dizziness no passing out no decreased exercise tolerance.  She also has history of dyslipidemia, essential hypertension. ?She is in my office today for follow-up.  She just finished doing rehab for some pain in the right lower back her blood pressure is elevated today my office.  She denies have any chest pain tightness squeezing pressure burning chest no palpitations dizziness swelling of lower extremities ? ?Past Medical History:  ?Diagnosis Date  ? Arthritis   ? left hip and knee  ? Atypical chest pain 10/25/2018  ? Cholecystitis with cholelithiasis 02/11/2018  ? Chronic right shoulder pain 07/04/2018  ? Constipation due to outlet dysfunction   ? Dizziness 10/25/2018  ? Dyslipidemia 10/25/2018  ? Essential hypertension 04/28/2018  ? Changed arb to CCB 04/24/2018 due to cough x one month trial   ? Hyperlipidemia   ? Hypertension   ? Hypertensive disorder 04/18/2018  ? Multiple pulmonary nodules determined by computed tomography of lung 01/26/2018  ? Passive smoke exp/ remote  Chest CT  09/24/17 MPN's  Largest 4 x 6 mm R laterally  > rec f/u 09/14/18 (reminder file)   As pt reports neg CT 2015 (not avail)  - ESR 01/25/2018 = 35  - CT chest 07/11/18 =  5 mm > no f/u needed   ? Primary osteoarthritis of left hip 11/26/2019  ? Primary osteoarthritis of left knee 03/11/2020  ? Rectal pain   ? Sinus bradycardia 10/25/2018  ? Unilateral primary osteoarthritis, left knee 06/14/2018  ? Upper airway cough syndrome  01/25/2018  ? Onset 1980 CT head 12/22/17 neg sinus dz FENO 01/25/2018  =   21 - Allergy profile 01/25/2018 >  Eos 0.1 /  IgE  17 RAST neg  - flare 02/12/18 p et  - gabapentin 100 mg tid 03/23/2018 > increased to max of 300 tid 04/24/2018 > improved so increased to 300 qid 06/05/2018 > d/c'd around 02/2019 s flare   ? ? ?Past Surgical History:  ?Procedure Laterality Date  ? ABDOMINAL HYSTERECTOMY  1997  ? ANAL RECTAL MANOMETRY N/A 08/09/2019  ? Procedure: ANO RECTAL MANOMETRY;  Surgeon: Thornton Park, MD;  Location: [facility];  Service: Gastroenterology;  Laterality: N/A;  ? BREAST SURGERY  2002  ? left cyst removal   ? CHOLECYSTECTOMY N/A 02/12/2018  ? Procedure: LAPAROSCOPIC CHOLECYSTECTOMY;  Surgeon: Leighton Ruff, MD;  Location: [facility];  Service: General;  Laterality: N/A;  ? COLONOSCOPY    ? POLYPECTOMY    ? ? ?Current Medications: ?Current Meds  ?Medication Sig  ? BILE SALTS-CAPS-CASC-PHENOLPTH PO Take by mouth daily as needed (for digestion).  ? cholecalciferol (VITAMIN D3) 25 MCG (1000 UT) tablet Take 1,000 Units by mouth daily.  ? ezetimibe (ZETIA) 10 MG tablet TAKE 1 TABLET BY MOUTH EVERY DAY (Patient taking differently: Take 10 mg by mouth daily.)  ? irbesartan (AVAPRO) 75 MG tablet TAKE 1/2 TABLETS BY MOUTH DAILY. (Patient  taking differently: Take 75 mg by mouth daily.)  ?  ? ?Allergies:   Patient has no known allergies.  ? ?Social History  ? ?Socioeconomic History  ? Marital status: Divorced  ?  Spouse name: Not on file  ? Number of children: Not on file  ? Years of education: Not on file  ? Highest education level: Not on file  ?Occupational History  ? Not on file  ?Tobacco Use  ? Smoking status: Never  ? Smokeless tobacco: Never  ?Vaping Use  ? Vaping Use: Never used  ?Substance and Sexual Activity  ? Alcohol use: Never  ? Drug use: Not Currently  ? Sexual activity: Not on file  ?Other Topics Concern  ? Not on file  ?Social History Narrative  ? Not on file  ? ?Social Determinants of Health  ? ?Financial  Resource Strain: Low Risk   ? Difficulty of Paying Living Expenses: Not hard at all  ?Food Insecurity: No Food Insecurity  ? Worried About Charity fundraiser in the Last Year: Never true  ? Ran Out of Food in the Last Year: Never true  ?Transportation Needs: No Transportation Needs  ? Lack of Transportation (Medical): No  ? Lack of Transportation (Non-Medical): No  ?Physical Activity: Sufficiently Active  ? Days of Exercise per Week: 4 days  ? Minutes of Exercise per Session: 50 min  ?Stress: No Stress Concern Present  ? Feeling of Stress : Not at all  ?Social Connections: Moderately Integrated  ? Frequency of Communication with Friends and Family: More than three times a week  ? Frequency of Social Gatherings with Friends and Family: More than three times a week  ? Attends Religious Services: More than 4 times per year  ? Active Member of Clubs or Organizations: Yes  ? Attends Archivist Meetings: Patient refused  ? Marital Status: Divorced  ?  ? ?Family History: ?The patient's family history includes Colon cancer in her maternal uncle; Diabetes in her mother; Hypertension in her mother; Lung cancer in her father; Thyroid disease in her mother; Vascular Disease in her mother. There is no history of Colon polyps, Esophageal cancer, Stomach cancer, Rectal cancer, or Breast cancer. ?ROS:   ?Please see the history of present illness.    ?All 14 point review of systems negative except as described per history of present illness ? ?EKGs/Labs/Other Studies Reviewed:   ? ? ? ?Recent Labs: ?10/19/2021: ALT 15; BUN 26; Creatinine, Ser 1.23; Hemoglobin 13.4; Platelets 236; Potassium 3.9; Sodium 137  ?Recent Lipid Panel ?   ?Component Value Date/Time  ? CHOL 267 (H) 03/02/2021 1024  ? TRIG 95 03/02/2021 1024  ? HDL 52 03/02/2021 1024  ? CHOLHDL 5.1 (H) 03/02/2021 1024  ? CHOLHDL 3.9 04/03/2020 1115  ? VLDL 26.6 06/19/2019 0849  ? LDLCALC 199 (H) 03/02/2021 1024  ? LDLCALC 140 (H) 04/03/2020 1115  ? ? ?Physical Exam:    ? ?VS:  BP (!) 176/100 (BP Location: Right Arm, Patient Position: Sitting)   Pulse (!) 55   Ht 5' 4.5" (1.638 m)   Wt 203 lb (92.1 kg)   SpO2 98%   BMI 34.31 kg/m?    ? ?Wt Readings from Last 3 Encounters:  ?12/14/21 203 lb (92.1 kg)  ?11/25/21 188 lb (85.3 kg)  ?10/20/21 193 lb (87.5 kg)  ?  ? ?GEN:  Well nourished, well developed in no acute distress ?HEENT: Normal ?NECK: No JVD; No carotid bruits ?LYMPHATICS: No lymphadenopathy ?CARDIAC: RRR, no  murmurs, no rubs, no gallops ?RESPIRATORY:  Clear to auscultation without rales, wheezing or rhonchi  ?ABDOMEN: Soft, non-tender, non-distended ?MUSCULOSKELETAL:  No edema; No deformity  ?SKIN: Warm and dry ?LOWER EXTREMITIES: no swelling ?NEUROLOGIC:  Alert and oriented x 3 ?PSYCHIATRIC:  Normal affect  ? ?ASSESSMENT:   ? ?1. Essential hypertension   ?2. Sinus bradycardia   ?3. Primary hypertension   ?4. Dyslipidemia   ? ?PLAN:   ? ?In order of problems listed above: ? ?Sinus bradycardia seems to be completely absolutely asymptomatic and warned her about signs and symptoms of worsening of this condition any dizziness passing out or decreased exercise tolerance asked her to let me know if this develops ?Essential hypertension blood pressure elevated today but again she just finished doing cardiac rehab on top of that she tells me she had a very rough night could not sleep well although still probably contributing to her high blood pressure being today. ?Dyslipidemia I did review her K PN which show me LDL of 199 this is from summer of last year.  We will recheck her fasting lipid profile she is only on Zetia ? ? ?Medication Adjustments/Labs and Tests Ordered: ?Current medicines are reviewed at length with the patient today.  Concerns regarding medicines are outlined above.  ?No orders of the defined types were placed in this encounter. ? ?Medication changes: No orders of the defined types were placed in this encounter. ? ? ?Signed, ?Park Liter, MD,  [facility] ?12/14/2021 4:17 PM    ?[place] ?

## 2021-12-14 NOTE — Therapy (Signed)
[place] ?[facility] ?[place], [place], [address] ?Phone: [phone]   Fax:  [phone] ? ?Physical Therapy Treatment/Discharge Summary ? ?Patient Details  ?Name: Nancy Christensen ?MRN: [id number] ?Date of Birth: 1950/10/04 ?Referring Provider (PT): Martinique, Betty G, MD ? ?Progress Note ?Reporting Period 10/05/21 to 12/14/21 ? ?See note below for Objective Data and Assessment of Progress/Goals.  ? ?  ?Encounter Date: 12/14/2021 ? ? PT End of Session - 12/14/21 1517   ? ? Visit Number 10   ? Date for PT Re-Evaluation 12/14/21   ? Authorization Type Medicare Part A/B, KX at visit 15   ? Progress Note Due on Visit 10   ? PT Start Time 9741   ? PT Stop Time 1510   has another appt to get to  ? PT Time Calculation (min) 25 min   ? Activity Tolerance Patient tolerated treatment well   ? ?  ?  ? ?  ? ? ?Past Medical History:  ?Diagnosis Date  ? Arthritis   ? left hip and knee  ? Atypical chest pain 10/25/2018  ? Cholecystitis with cholelithiasis 02/11/2018  ? Chronic right shoulder pain 07/04/2018  ? Constipation due to outlet dysfunction   ? Dizziness 10/25/2018  ? Dyslipidemia 10/25/2018  ? Essential hypertension 04/28/2018  ? Changed arb to CCB 04/24/2018 due to cough x one month trial   ? Hyperlipidemia   ? Hypertension   ? Hypertensive disorder 04/18/2018  ? Multiple pulmonary nodules determined by computed tomography of lung 01/26/2018  ? Passive smoke exp/ remote  Chest CT  09/24/17 MPN's  Largest 4 x 6 mm R laterally  > rec f/u 09/14/18 (reminder file)   As pt reports neg CT 2015 (not avail)  - ESR 01/25/2018 = 35  - CT chest 07/11/18 =  5 mm > no f/u needed   ? Primary osteoarthritis of left hip 11/26/2019  ? Primary osteoarthritis of left knee 03/11/2020  ? Rectal pain   ? Sinus bradycardia 10/25/2018  ? Unilateral primary osteoarthritis, left knee 06/14/2018  ? Upper airway cough syndrome 01/25/2018  ? Onset 1980 CT head 12/22/17 neg sinus dz FENO 01/25/2018  =   21 - Allergy  profile 01/25/2018 >  Eos 0.1 /  IgE  17 RAST neg  - flare 02/12/18 p et  - gabapentin 100 mg tid 03/23/2018 > increased to max of 300 tid 04/24/2018 > improved so increased to 300 qid 06/05/2018 > d/c'd around 02/2019 s flare   ? ? ?Past Surgical History:  ?Procedure Laterality Date  ? ABDOMINAL HYSTERECTOMY  1997  ? ANAL RECTAL MANOMETRY N/A 08/09/2019  ? Procedure: ANO RECTAL MANOMETRY;  Surgeon: Thornton Park, MD;  Location: [facility];  Service: Gastroenterology;  Laterality: N/A;  ? BREAST SURGERY  2002  ? left cyst removal   ? CHOLECYSTECTOMY N/A 02/12/2018  ? Procedure: LAPAROSCOPIC CHOLECYSTECTOMY;  Surgeon: Leighton Ruff, MD;  Location: [facility];  Service: General;  Laterality: N/A;  ? COLONOSCOPY    ? POLYPECTOMY    ? ? ?There were no vitals filed for this visit. ? ? Subjective Assessment - 12/14/21 1450   ? ? Subjective Did well after last time.  No pain today.  I want to review that (hip hinge) with the golf club.   ? Pertinent History OA Lt knee, hip pain - has done PT for Lt knee and hip   ? Limitations Sitting;Lifting;Standing;Walking;House hold activities;Other (comment)   ? Patient  Stated Goals understand where pain is coming from and prevent it in future   ? Currently in Pain? No/denies   ? Pain Score 0-No pain   ? Pain Type Chronic pain   ? ?  ?  ? ?  ? ? ? ? ? [facility] PT Assessment - 12/14/21 0001   ? ?  ? Assessment  ? Medical Diagnosis M54.50,G89.29 (ICD-10-CM) - Chronic right-sided low back pain without sciatica   ? Referring Provider (PT) Martinique, Betty G, MD   ? Onset Date/Surgical Date --   chronic x 1-2 years with acute flare ups  ? Next MD Visit as needed   ? Prior Therapy for Lt hip and knee   ?  ? AROM  ? Overall AROM Comments all ROM painfree   ? Lumbar Flexion full   ? Lumbar Extension full   ? Lumbar - Right Side Bend WNL   ? Lumbar - Left Side Bend WNLs   ? Lumbar - Left Rotation WNL   ?  ? Strength  ? Overall Strength Comments Lt hip and knee 5/5;  SLS > 20 sec bil   ?  ? Flexibility  ?  Quadriceps WNLs hip flexors, quads, HS   ? Quadratus Lumborum WNL   ? ?  ?  ? ?  ? ? ? ? ? ? ? ? ? ? ? ? ? ? ? ? [facility] Treatment/Exercise - 12/14/21 0001   ? ?  ? Lumbar Exercises: Standing  ? Lifting Limitations hip hinge with golf club; 10# kettlebell dead lifts 15x   ? Other Standing Lumbar Exercises snatch and press overhead 5# kettlebell 5-10x each side   bend knees to press overhead to help right shoulder  ? Other Standing Lumbar Exercises 10# kettlebell dead lifts 10x   ?  ? Lumbar Exercises: Seated  ? Sit to Stand Limitations with 10# kettlebell 15x   ? Other Seated Lumbar Exercises discussed circuit style with dry erase board with built in rest breaks   ? Other Seated Lumbar Exercises review of HEP   ? ?  ?  ? ?  ? ? ? ? ? ? ? ? ? ? ? ? PT Short Term Goals - 11/16/21 1050   ? ?  ? PT SHORT TERM GOAL #1  ? Title be independent in intial HEP   ? Status Achieved   ? ?  ?  ? ?  ? ? ? ? PT Long Term Goals - 12/14/21 1458   ? ?  ? PT LONG TERM GOAL #1  ? Title Pt will demo full trunk Rt SB and Rt Rot with min or no pain in Rt lumbar spine   ? Status Achieved   ?  ? PT LONG TERM GOAL #2  ? Title Normalize flexibility of proximal Rt hip flexor, quad and adductors to reduce anterior pull on pelvis.   ? Status Achieved   ?  ? PT LONG TERM GOAL #3  ? Title Pt will achieve at least 4+/5 strength in Lt hip abd and quad for improved closed chain stability for stairs, gait and squatting   ? Status Achieved   ?  ? PT LONG TERM GOAL #4  ? Title Pt will be able to demo core and trunk control for proper technique with bend/lift and carry to reduce risk of acute LBP with functional tasks.   ? Status Achieved   ?  ? PT LONG TERM GOAL #5  ? Title  maintain FOTO score of 83% or better which was answered per current improved pain state.   ? Status Achieved   ? ?  ?  ? ?  ? ? ? ? ? ? ? ? Plan - 12/14/21 1521   ? ? Clinical Impression Statement The patient has responded exceptionally well to PT.  Her spinal and LE ROM is full  and painfree.  Normal flexibility.  Lumbopelvic/hip strength is grossly 5/5 throughout.  All rehab goals have been met.  She has been instructed in a comprehensive HEP and basic gym equipment use for her fitness facility.  We discussed a progression over time and variations for additional challenge.  Recommend discharge from PT at this time.   ? ?  ?  ? ?  ? ? ?Patient will benefit from skilled therapeutic intervention in order to improve the following deficits and impairments:    ? ?Visit Diagnosis: ?Right-sided low back pain without sciatica, unspecified chronicity ? ?Cramp and spasm ? ?Muscle weakness (generalized) ? ?PHYSICAL THERAPY DISCHARGE SUMMARY ? ?Visits from Start of Care: 10 ? ?Current functional level related to goals / functional outcomes: ?See clinical impressions above ?  ?Remaining deficits: ?As above ?  ?Education / Equipment: ?Comprehensive HEP  ? ?Patient agrees to discharge. Patient goals were met. Patient is being discharged due to meeting the stated rehab goals.  ? ? ?Problem List ?Patient Active Problem List  ? Diagnosis Date Noted  ? Hypertension   ? Hyperlipidemia   ? Arthritis   ? Primary osteoarthritis of left knee 03/11/2020  ? Primary osteoarthritis of left hip 11/26/2019  ? Constipation due to outlet dysfunction   ? Rectal pain   ? Dizziness 10/25/2018  ? Sinus bradycardia 10/25/2018  ? Atypical chest pain 10/25/2018  ? Dyslipidemia 10/25/2018  ? Chronic right shoulder pain 07/04/2018  ? Unilateral primary osteoarthritis, left knee 06/14/2018  ? Essential hypertension 04/28/2018  ? Hypertensive disorder 04/18/2018  ? Cholecystitis with cholelithiasis 02/11/2018  ? Multiple pulmonary nodules determined by computed tomography of lung 01/26/2018  ? Upper airway cough syndrome 01/25/2018  ? ?Ruben Im, PT ?12/14/21 3:25 PM ?Phone: [phone] ?Fax: [phone]  ?Alvera Singh, PT ?12/14/2021, 3:24 PM ? ?[facility] ?[place], [place], [address] ?Phone: [phone]   Fax:  [phone] ? ?Name: Nancy Christensen ?MRN: [id number] ?Date of Birth: June 02, 1951 ? ? ? ?

## 2021-12-15 LAB — LDL CHOLESTEROL, DIRECT: LDL Direct: 155 mg/dL — ABNORMAL HIGH (ref 0–99)

## 2021-12-16 ENCOUNTER — Encounter: Payer: Medicare Other | Admitting: Physical Therapy

## 2021-12-17 ENCOUNTER — Telehealth: Payer: Self-pay

## 2021-12-17 DIAGNOSIS — E785 Hyperlipidemia, unspecified: Secondary | ICD-10-CM

## 2021-12-17 MED ORDER — NEXLETOL 180 MG PO TABS: ORAL_TABLET | ORAL | 1 refills | Status: DC

## 2021-12-17 NOTE — Telephone Encounter (Signed)
-----   Message from Park Liter, MD sent at 12/15/2021  7:55 PM EDT ----- ?Cholesterol much better than before but still high I would like to add Nexcletol 1 tablet daily if she agree fasting lipid profile, AST LT 6 weeks ?

## 2021-12-17 NOTE — Telephone Encounter (Signed)
Patient aware of results and recommendation and agreed with plan. Rx sent. Lab order on file. Patient aware to be fasting.  ?

## 2021-12-21 ENCOUNTER — Telehealth: Payer: Self-pay

## 2021-12-21 ENCOUNTER — Encounter: Payer: Medicare Other | Admitting: Physical Therapy

## 2021-12-21 NOTE — Telephone Encounter (Signed)
Prior Auth for [name] Nexletol approved thru cover my meds. Letter processed to scan. Pharmacy notified.  ? ? ?Nancy Christensen (Key: [id number]) ? ?This request has received an approval. View the bottom of the request for an electronic copy of the approval letter.Marland Kitchen  ?

## 2021-12-23 ENCOUNTER — Encounter: Payer: Medicare Other | Admitting: Physical Therapy

## 2021-12-24 DIAGNOSIS — I8312 Varicose veins of left lower extremity with inflammation: Secondary | ICD-10-CM | POA: Diagnosis not present

## 2022-01-06 ENCOUNTER — Other Ambulatory Visit: Payer: Self-pay | Admitting: Family Medicine

## 2022-01-06 DIAGNOSIS — I10 Essential (primary) hypertension: Secondary | ICD-10-CM

## 2022-01-10 DIAGNOSIS — Z20822 Contact with and (suspected) exposure to covid-19: Secondary | ICD-10-CM | POA: Diagnosis not present

## 2022-01-29 DIAGNOSIS — Z20822 Contact with and (suspected) exposure to covid-19: Secondary | ICD-10-CM | POA: Diagnosis not present

## 2022-01-31 DIAGNOSIS — Z20822 Contact with and (suspected) exposure to covid-19: Secondary | ICD-10-CM | POA: Diagnosis not present

## 2022-02-24 ENCOUNTER — Encounter: Payer: Self-pay | Admitting: Family Medicine

## 2022-02-24 ENCOUNTER — Ambulatory Visit (INDEPENDENT_AMBULATORY_CARE_PROVIDER_SITE_OTHER): Payer: Medicare Other | Admitting: Family Medicine

## 2022-02-24 VITALS — BP 122/88 | HR 54 | Temp 98.1°F | Wt 208.0 lb

## 2022-02-24 DIAGNOSIS — M542 Cervicalgia: Secondary | ICD-10-CM | POA: Diagnosis not present

## 2022-02-24 DIAGNOSIS — H6982 Other specified disorders of Eustachian tube, left ear: Secondary | ICD-10-CM | POA: Diagnosis not present

## 2022-02-24 DIAGNOSIS — I10 Essential (primary) hypertension: Secondary | ICD-10-CM

## 2022-02-24 MED ORDER — IRBESARTAN 75 MG PO TABS: ORAL_TABLET | ORAL | 3 refills | Status: DC

## 2022-02-24 MED ORDER — FLUTICASONE PROPIONATE 50 MCG/ACT NA SUSP: 1.0000 | Freq: Every day | NASAL | 0 refills | Status: DC

## 2022-02-24 NOTE — Progress Notes (Signed)
Subjective:    Patient ID: Nancy Christensen, female    DOB: Oct 10, 1950, 70 y.o.   MRN: [id number]  Chief Complaint  Patient presents with   Ear Pain    Pain and tenderness from left ear down to left side of throat. Started Sunday morning, took tylenol, and tried an Administrator plus felt like it was a cold. Was at an event on Saturday and was sitting under a vent, feels this may be the cause, has not had a sore throat, no sneezing, no coughing.    HPI Patient was seen today for acute concern. Pt with from left ear down to neck which started on Sunday.  Patient tried Tylenol and Alka-Seltzer plus for symptoms.  Patient denies sneezing, sore throat, cough, rhinorrhea, ear pain/pressure, muffled hearing, hoarseness.  Patient thinks symptoms may have started after sitting under a vent blowing cool air on her on Saturday at an event.  Past Medical History:  Diagnosis Date   Arthritis    left hip and knee   Atypical chest pain 10/25/2018   Cholecystitis with cholelithiasis 02/11/2018   Chronic right shoulder pain 07/04/2018   Constipation due to outlet dysfunction    Dizziness 10/25/2018   Dyslipidemia 10/25/2018   Essential hypertension 04/28/2018   Changed arb to CCB 04/24/2018 due to cough x one month trial    Hyperlipidemia    Hypertension    Hypertensive disorder 04/18/2018   Multiple pulmonary nodules determined by computed tomography of lung 01/26/2018   Passive smoke exp/ remote  Chest CT  09/24/17 MPN's  Largest 4 x 6 mm R laterally  > rec f/u 09/14/18 (reminder file)   As pt reports neg CT 2015 (not avail)  - ESR 01/25/2018 = 35  - CT chest 07/11/18 =  5 mm > no f/u needed    Primary osteoarthritis of left hip 11/26/2019   Primary osteoarthritis of left knee 03/11/2020   Rectal pain    Sinus bradycardia 10/25/2018   Unilateral primary osteoarthritis, left knee 06/14/2018   Upper airway cough syndrome 01/25/2018   Onset 1980 CT head 12/22/17 neg sinus dz FENO 01/25/2018  =   21 - Allergy profile 01/25/2018  >  Eos 0.1 /  IgE  17 RAST neg  - flare 02/12/18 p et  - gabapentin 100 mg tid 03/23/2018 > increased to max of 300 tid 04/24/2018 > improved so increased to 300 qid 06/05/2018 > d/c'd around 02/2019 s flare     No Known Allergies  ROS General: Denies fever, chills, night sweats, changes in weight, changes in appetite HEENT: Denies headaches, ear pain, changes in vision, rhinorrhea, sore throat   CV: Denies CP, palpitations, SOB, orthopnea Pulm: Denies SOB, cough, wheezing GI: Denies abdominal pain, nausea, vomiting, diarrhea, constipation GU: Denies dysuria, hematuria, frequency, vaginal discharge Msk: Denies muscle cramps, joint pains  + left-sided neck pain Neuro: Denies weakness, numbness, tingling Skin: Denies rashes, bruising Psych: Denies depression, anxiety, hallucinations     Objective:    Blood pressure 122/88, pulse (!) 54, temperature 98.1 F (36.7 C), temperature source Oral, weight 208 lb (94.3 kg), SpO2 99 %.  Gen. Pleasant, well-nourished, in no distress, normal affect   HEENT: [place]/AT, face symmetric, conjunctiva clear, no scleral icterus, PERRLA, EOMI, nares patent without drainage, pharynx without erythema or exudate.  Right TM full.  Left TM full without erythema or effusion.  No cervical lymphadenopathy. Lungs: no accessory muscle use, CTAB, no wheezes or rales Cardiovascular: RRR, no m/r/g, no peripheral edema  Musculoskeletal: No deformities, no cyanosis or clubbing, normal tone Neuro:  A&Ox3, CN II-XII intact, normal gait Skin:  Warm, no lesions/ rash   Wt Readings from Last 3 Encounters:  02/24/22 208 lb (94.3 kg)  12/14/21 203 lb (92.1 kg)  11/25/21 188 lb (85.3 kg)    Lab Results  Component Value Date   WBC 8.9 10/19/2021   HGB 13.4 10/19/2021   HCT 40.8 10/19/2021   PLT 236 10/19/2021   GLUCOSE 124 (H) 10/19/2021   CHOL 267 (H) 03/02/2021   TRIG 95 03/02/2021   HDL 52 03/02/2021   LDLDIRECT 155 (H) 12/14/2021   LDLCALC 199 (H) 03/02/2021   ALT 15  10/19/2021   AST 18 10/19/2021   NA 137 10/19/2021   K 3.9 10/19/2021   CL 104 10/19/2021   CREATININE 1.23 (H) 10/19/2021   BUN 26 (H) 10/19/2021   CO2 26 10/19/2021   INR 0.9 10/19/2021   HGBA1C 5.6 03/09/2021    Assessment/Plan:  Dysfunction of left eustachian tube  -Discussed using OTC antihistamine and/or nasal spray to help relieve symptoms. -Pt to try Allegra and Flonase that she has at home -Given handout -Given precautions. For continued or worsening symptoms contact clinic as may be developing AOM. - Plan: fluticasone (FLONASE) 50 MCG/ACT nasal spray  Neck pain on left side -2/2 Eustachian tube dysfunction -Tylenol prn  Essential hypertension -well controlled -continue avapro 75 mg daily -continue lifestyle modifications  - Plan: irbesartan (AVAPRO) 75 MG tablet  F/u prn  Grier Mitts, MD

## 2022-03-18 ENCOUNTER — Other Ambulatory Visit: Payer: Self-pay | Admitting: Family Medicine

## 2022-03-18 DIAGNOSIS — H6982 Other specified disorders of Eustachian tube, left ear: Secondary | ICD-10-CM

## 2022-04-01 ENCOUNTER — Other Ambulatory Visit: Payer: Self-pay | Admitting: Family Medicine

## 2022-04-01 DIAGNOSIS — H6982 Other specified disorders of Eustachian tube, left ear: Secondary | ICD-10-CM

## 2022-05-06 ENCOUNTER — Ambulatory Visit (INDEPENDENT_AMBULATORY_CARE_PROVIDER_SITE_OTHER): Payer: Medicare Other | Admitting: Family Medicine

## 2022-05-06 ENCOUNTER — Encounter: Payer: Self-pay | Admitting: Family Medicine

## 2022-05-06 VITALS — BP 124/82 | HR 67 | Temp 98.3°F | Ht 64.5 in | Wt 213.6 lb

## 2022-05-06 DIAGNOSIS — S91132A Puncture wound without foreign body of left great toe without damage to nail, initial encounter: Secondary | ICD-10-CM | POA: Diagnosis not present

## 2022-05-06 DIAGNOSIS — Z23 Encounter for immunization: Secondary | ICD-10-CM | POA: Diagnosis not present

## 2022-05-06 DIAGNOSIS — S91232A Puncture wound without foreign body of left great toe with damage to nail, initial encounter: Secondary | ICD-10-CM | POA: Diagnosis not present

## 2022-05-06 DIAGNOSIS — S91139A Puncture wound without foreign body of unspecified toe(s) without damage to nail, initial encounter: Secondary | ICD-10-CM | POA: Insufficient documentation

## 2022-05-06 NOTE — Progress Notes (Unsigned)
   Established Patient Office Visit  Subjective   Patient ID: Nancy Christensen, female    DOB: 1951-03-11  Age: 71 y.o. MRN: [id number]  Chief Complaint  Patient presents with   Foot Injury    Patient complains of pain noted in the left great toe since 1pm today after stepping onto a rusty tack, while barefoot on the balcony    Pt reports she stepped on a rusty tack outside  around noon today. Reports that it did get stuck in her left great toe and she had to pull it out. She soaked her toe in water and used soap to clean the area. Pt reports she does not remember her last tetanus shot. She reports there is pain in her great toe but no redness, drainage, bleeding, or swelling.  Foot Injury  The incident occurred 6 to 12 hours ago. The incident occurred in the street. Injury mechanism: left great toe -- rusty take puncture. She reports no foreign bodies present.     Review of Systems  Constitutional:  Negative for chills and fever.      Objective:     BP 124/82 (BP Location: Left Arm, Patient Position: Sitting, Cuff Size: Large)   Pulse 67   Temp 98.3 F (36.8 C) (Oral)   Ht 5' 4.5" (1.638 m)   Wt 213 lb 9.6 oz (96.9 kg)   SpO2 98%   BMI 36.10 kg/m  {Vitals History (Optional):23777}  Physical Exam Cardiovascular:     Pulses:          Dorsalis pedis pulses are 2+ on the left side.       Posterior tibial pulses are 2+ on the left side.  Musculoskeletal:       Feet:  Feet:     Left foot:     Skin integrity: No ulcer, blister or skin breakdown.     Comments: Small single puncture site on the great toe, there is no surrounding edema, erythema, heat, or red streaking.     No results found for any visits on 05/06/22.  {Labs (Optional):23779}  The 10-year ASCVD risk score (Arnett DK, et al., 2019) is: 16%    Assessment & Plan:   Problem List Items Addressed This Visit       Other   Puncture wound of toe of left foot    Happened several hours ago due to a rusty  nail, patient will be given her TdaP today. Currently the toe appears to be without infection,  I went over the warning signs/symptoms of infection and she will monitor over the next 2-3 days. If signs develop then she was instructed to call the office and I will call in antibiotics for her.      Other Visit Diagnoses     Puncture wound without foreign body of left great toe without damage to nail, initial encounter    -  Primary   Relevant Orders   Tdap vaccine greater than or equal to 7yo IM              No follow-ups on file.    Farrel Conners, MD

## 2022-05-06 NOTE — Assessment & Plan Note (Signed)
Happened several hours ago due to a rusty nail, patient will be given her TdaP today. Currently the toe appears to be without infection,  I went over the warning signs/symptoms of infection and she will monitor over the next 2-3 days. If signs develop then she was instructed to call the office and I will call in antibiotics for her.

## 2022-05-23 ENCOUNTER — Other Ambulatory Visit: Payer: Self-pay | Admitting: Family Medicine

## 2022-05-23 DIAGNOSIS — I10 Essential (primary) hypertension: Secondary | ICD-10-CM

## 2022-05-25 ENCOUNTER — Telehealth: Payer: Self-pay | Admitting: Family Medicine

## 2022-05-25 DIAGNOSIS — I10 Essential (primary) hypertension: Secondary | ICD-10-CM

## 2022-05-25 NOTE — Telephone Encounter (Signed)
Pt needs a Rx refill for irbesartan (AVAPRO) 75 MG tablet [facility]   [id number]  [phone] Fax:  [phone]    Pt is back to take the full 75 MG so she went through the meds faster than expected.   Please advise.

## 2022-05-27 MED ORDER — IRBESARTAN 75 MG PO TABS: ORAL_TABLET | ORAL | 3 refills | Status: DC

## 2022-05-27 NOTE — Telephone Encounter (Signed)
Spoke with pt, is aware to only take half tablet daily.

## 2022-06-02 ENCOUNTER — Encounter: Payer: Self-pay | Admitting: Cardiology

## 2022-06-02 ENCOUNTER — Ambulatory Visit: Payer: Medicare Other | Attending: Cardiology | Admitting: Cardiology

## 2022-06-02 VITALS — BP 142/90 | HR 71 | Ht 64.5 in | Wt 214.0 lb

## 2022-06-02 DIAGNOSIS — E785 Hyperlipidemia, unspecified: Secondary | ICD-10-CM | POA: Insufficient documentation

## 2022-06-02 DIAGNOSIS — I10 Essential (primary) hypertension: Secondary | ICD-10-CM | POA: Insufficient documentation

## 2022-06-02 DIAGNOSIS — R001 Bradycardia, unspecified: Secondary | ICD-10-CM | POA: Diagnosis not present

## 2022-06-02 MED ORDER — NEXLETOL 180 MG PO TABS: 1.0000 | ORAL_TABLET | Freq: Every day | ORAL | 6 refills | Status: DC

## 2022-06-02 NOTE — Progress Notes (Unsigned)
Cardiology Office Note:    Date:  06/02/2022   ID:  Nancy Christensen, DOB 15-Jan-1951, MRN [id number]  PCP:  Billie Ruddy, MD  Cardiologist:  Jenne Campus, MD    Referring MD: Billie Ruddy, MD   Chief Complaint  Patient presents with   Follow-up  Doing fine  History of Present Illness:    Nancy Christensen is a 71 y.o. female with past medical history significant for essential hypertension, dyslipidemia, sinus bradycardia.  She comes today to my office for follow-up.  Overall denies having any dizziness or passing out she complain of having some chest pain which happen typically at rest at the same time she is able to walk climb stairs with no difficulties up only 3 times once I seen her last time.  Past Medical History:  Diagnosis Date   Arthritis    left hip and knee   Atypical chest pain 10/25/2018   Cholecystitis with cholelithiasis 02/11/2018   Chronic right shoulder pain 07/04/2018   Constipation due to outlet dysfunction    Dizziness 10/25/2018   Dyslipidemia 10/25/2018   Essential hypertension 04/28/2018   Changed arb to CCB 04/24/2018 due to cough x one month trial    Hyperlipidemia    Hypertension    Hypertensive disorder 04/18/2018   Multiple pulmonary nodules determined by computed tomography of lung 01/26/2018   Passive smoke exp/ remote  Chest CT  09/24/17 MPN's  Largest 4 x 6 mm R laterally  > rec f/u 09/14/18 (reminder file)   As pt reports neg CT 2015 (not avail)  - ESR 01/25/2018 = 35  - CT chest 07/11/18 =  5 mm > no f/u needed    Primary osteoarthritis of left hip 11/26/2019   Primary osteoarthritis of left knee 03/11/2020   Rectal pain    Sinus bradycardia 10/25/2018   Unilateral primary osteoarthritis, left knee 06/14/2018   Upper airway cough syndrome 01/25/2018   Onset 1980 CT head 12/22/17 neg sinus dz FENO 01/25/2018  =   21 - Allergy profile 01/25/2018 >  Eos 0.1 /  IgE  17 RAST neg  - flare 02/12/18 p et  - gabapentin 100 mg tid 03/23/2018 > increased to max of 300  tid 04/24/2018 > improved so increased to 300 qid 06/05/2018 > d/c'd around 02/2019 s flare     Past Surgical History:  Procedure Laterality Date   ABDOMINAL HYSTERECTOMY  1997   ANAL RECTAL MANOMETRY N/A 08/09/2019   Procedure: ANO RECTAL MANOMETRY;  Surgeon: Thornton Park, MD;  Location: [facility];  Service: Gastroenterology;  Laterality: N/A;   BREAST SURGERY  2002   left cyst removal    CHOLECYSTECTOMY N/A 02/12/2018   Procedure: LAPAROSCOPIC CHOLECYSTECTOMY;  Surgeon: Leighton Ruff, MD;  Location: [facility];  Service: General;  Laterality: N/A;   COLONOSCOPY     POLYPECTOMY      Current Medications: Current Meds  Medication Sig   BILE SALTS-CAPS-CASC-PHENOLPTH PO Take by mouth daily as needed (for digestion).   cholecalciferol (VITAMIN D3) 25 MCG (1000 UT) tablet Take 1,000 Units by mouth daily.   ezetimibe (ZETIA) 10 MG tablet TAKE 1 TABLET BY MOUTH EVERY DAY (Patient taking differently: Take 10 mg by mouth daily.)   irbesartan (AVAPRO) 75 MG tablet TAKE 1/2 TABLETS BY MOUTH DAILY     Allergies:   Patient has no known allergies.   Social History   Socioeconomic History   Marital status: Divorced    Spouse name: Not on file  Number of children: Not on file   Years of education: Not on file   Highest education level: Not on file  Occupational History   Not on file  Tobacco Use   Smoking status: Never   Smokeless tobacco: Never  Vaping Use   Vaping Use: Never used  Substance and Sexual Activity   Alcohol use: Never   Drug use: Not Currently   Sexual activity: Not on file  Other Topics Concern   Not on file  Social History Narrative   Not on file   Social Determinants of Health   Financial Resource Strain: Low Risk  (02/22/2022)   Overall Financial Resource Strain (CARDIA)    Difficulty of Paying Living Expenses: Not hard at all  Food Insecurity: No Food Insecurity (02/22/2022)   Hunger Vital Sign    Worried About Running Out of Food in the Last Year: Never  true    [place] in the Last Year: Never true  Transportation Needs: No Transportation Needs (02/22/2022)   PRAPARE - Hydrologist (Medical): No    Lack of Transportation (Non-Medical): No  Physical Activity: Sufficiently Active (02/22/2022)   Exercise Vital Sign    Days of Exercise per Week: 5 days    Minutes of Exercise per Session: 50 min  Stress: No Stress Concern Present (02/22/2022)   [place]    Feeling of Stress : Not at all  Social Connections: Moderately Integrated (02/22/2022)   Social Connection and Isolation Panel [NHANES]    Frequency of Communication with Friends and Family: More than three times a week    Frequency of Social Gatherings with Friends and Family: Twice a week    Attends Religious Services: More than 4 times per year    Active Member of [employer] or Organizations: Yes    Attends Music therapist: More than 4 times per year    Marital Status: Divorced     Family History: The patient's family history includes Colon cancer in her maternal uncle; Diabetes in her mother; Hypertension in her mother; Lung cancer in her father; Thyroid disease in her mother; Vascular Disease in her mother. There is no history of Colon polyps, Esophageal cancer, Stomach cancer, Rectal cancer, or Breast cancer. ROS:   Please see the history of present illness.    All 14 point review of systems negative except as described per history of present illness  EKGs/Labs/Other Studies Reviewed:      Recent Labs: 10/19/2021: ALT 15; BUN 26; Creatinine, Ser 1.23; Hemoglobin 13.4; Platelets 236; Potassium 3.9; Sodium 137  Recent Lipid Panel    Component Value Date/Time   CHOL 267 (H) 03/02/2021 1024   TRIG 95 03/02/2021 1024   HDL 52 03/02/2021 1024   CHOLHDL 5.1 (H) 03/02/2021 1024   CHOLHDL 3.9 04/03/2020 1115   VLDL 26.6 06/19/2019 0849   LDLCALC 199 (H) 03/02/2021 1024    LDLCALC 140 (H) 04/03/2020 1115   LDLDIRECT 155 (H) 12/14/2021 1643    Physical Exam:    VS:  BP (!) 142/90 (BP Location: Left Arm, Patient Position: Sitting)   Pulse 71   Ht 5' 4.5" (1.638 m)   Wt 214 lb (97.1 kg)   SpO2 97%   BMI 36.17 kg/m     Wt Readings from Last 3 Encounters:  06/02/22 214 lb (97.1 kg)  05/06/22 213 lb 9.6 oz (96.9 kg)  02/24/22 208 lb (94.3 kg)  GEN:  Well nourished, well developed in no acute distress HEENT: Normal NECK: No JVD; No carotid bruits LYMPHATICS: No lymphadenopathy CARDIAC: RRR, no murmurs, no rubs, no gallops RESPIRATORY:  Clear to auscultation without rales, wheezing or rhonchi  ABDOMEN: Soft, non-tender, non-distended MUSCULOSKELETAL:  No edema; No deformity  SKIN: Warm and dry LOWER EXTREMITIES: no swelling NEUROLOGIC:  Alert and oriented x 3 PSYCHIATRIC:  Normal affect   ASSESSMENT:    1. Sinus bradycardia   2. Essential hypertension   3. Dyslipidemia    PLAN:    In order of problems listed above:  Sinus bradycardia not critical asymptomatic continue present management.  I want her 1 more time about signs and symptoms of worsening of the problem ask her to let me know if she get develop dizziness or passing Essential hypertension elevated in the office but she said it is good at home we will before she left the her home to come to our office her blood pressure was 130/70.  We will continue monitoring.  She actually does have a request to get blood pressure monitor from her insurance company will help her with this and hopefully she will be able to check her blood pressure on the regular basis Dyslipidemia I did review her K PN which only her LDL of 155 calculate her 10 years predicted risk which is more than 20%.  She needs to have better cholesterol control.  I will give her Nexletol and see if that helps   Medication Adjustments/Labs and Tests Ordered: Current medicines are reviewed at length with the patient today.   Concerns regarding medicines are outlined above.  No orders of the defined types were placed in this encounter.  Medication changes: No orders of the defined types were placed in this encounter.   Signed, Park Liter, MD, [facility] 06/02/2022 4:44 PM    [place]

## 2022-06-02 NOTE — Patient Instructions (Addendum)
Medication Instructions:  Your physician has recommended you make the following change in your medication:   START: Nexletol '180mg'$  1 tablet daily by mouth    Lab Work: None Ordered If you have labs (blood work) drawn today and your tests are completely normal, you will receive your results only by: [place] (if you have MyChart) OR A paper copy in the mail If you have any lab test that is abnormal or we need to change your treatment, we will call you to review the results.   Testing/Procedures: Your physician recommends that you return for lab work in: 6 weeks- after starting Nexletol You need to have labs done when you are fasting.  You can come Monday through Friday 8:30 am to 11:30 am and 1:00 to 4:30. [place] do not need to make an appointment as the order has already been placed. The labs you are going to have done are Lipid, AST, ALT   Follow-Up: At [facility], you and your health needs are our priority.  As part of our continuing mission to provide you with exceptional heart care, we have created designated Provider Care Teams.  These Care Teams include your primary Cardiologist (physician) and Advanced Practice Providers (APPs -  Physician Assistants and Nurse Practitioners) who all work together to provide you with the care you need, when you need it.  We recommend signing up for the patient portal called "MyChart".  Sign up information is provided on this After Visit Summary.  MyChart is used to connect with patients for Virtual Visits (Telemedicine).  Patients are able to view lab/test results, encounter notes, upcoming appointments, etc.  Non-urgent messages can be sent to your provider as well.   To learn more about what you can do with MyChart, go to [email or web address].    Your next appointment:   12 month(s)  The format for your next appointment:   In Person  Provider:   Jenne Campus, MD    Other  Instructions NA

## 2022-06-17 ENCOUNTER — Ambulatory Visit (INDEPENDENT_AMBULATORY_CARE_PROVIDER_SITE_OTHER): Payer: Medicare Other | Admitting: Family Medicine

## 2022-06-17 ENCOUNTER — Encounter: Payer: Self-pay | Admitting: Family Medicine

## 2022-06-17 VITALS — BP 176/104 | HR 59 | Temp 98.9°F | Wt 212.4 lb

## 2022-06-17 DIAGNOSIS — S91332A Puncture wound without foreign body, left foot, initial encounter: Secondary | ICD-10-CM

## 2022-06-17 DIAGNOSIS — I10 Essential (primary) hypertension: Secondary | ICD-10-CM | POA: Diagnosis not present

## 2022-06-17 DIAGNOSIS — R0982 Postnasal drip: Secondary | ICD-10-CM

## 2022-06-17 DIAGNOSIS — J392 Other diseases of pharynx: Secondary | ICD-10-CM

## 2022-06-17 DIAGNOSIS — L089 Local infection of the skin and subcutaneous tissue, unspecified: Secondary | ICD-10-CM

## 2022-06-17 MED ORDER — FEXOFENADINE HCL 180 MG PO TABS: 180.0000 mg | ORAL_TABLET | Freq: Every day | ORAL | 1 refills | Status: DC

## 2022-06-17 MED ORDER — CEPHALEXIN 500 MG PO CAPS: 500.0000 mg | ORAL_CAPSULE | Freq: Two times a day (BID) | ORAL | 0 refills | Status: AC

## 2022-06-17 NOTE — Progress Notes (Signed)
Subjective:    Patient ID: Nancy Christensen, female    DOB: 1951-04-29, 71 y.o.   MRN: [id number]  Chief Complaint  Patient presents with   Pain    Piercing in the left, noticed on Tuesday, was a black dot, blue-ish around it. Tender. Soaked it, tenderness eased up, on Wednesday. Yesterday noticed skin growing over the hole. Has a tingling sensation going up left calf.  Started getting congested on Monday, COVID neg. Usually gets a cough, is wanting some cough syrup with codene     HPI Patient was seen today for acute concern.  Pt endorses seeing a black spot on dorsum of L foot earlier this wk with bruising surrounding it.  Pt soaked her foot which helped with the tenderness.  Pt noticed a puncture in the center of the area which has seemed to close.  Having continued tenderness.  Pt also noticed tingling going up medial L lower leg.  Pt walks around barefoot at home.  Tdap done 05/06/22 for puncture wound on R foot s/p stepping on a tack.  Pt also endorses congestion, scratchy throat, drainage that started on Monday.  Pt tried Netti pot.  Having an occasional cough.  Concerned that she will develop increased coughing causing spasms as this typically happens when she gets sick.  Cough syrup with codeine generally helps.  Pt took bp medication today.  Past Medical History:  Diagnosis Date   Arthritis    left hip and knee   Atypical chest pain 10/25/2018   Cholecystitis with cholelithiasis 02/11/2018   Chronic right shoulder pain 07/04/2018   Constipation due to outlet dysfunction    Dizziness 10/25/2018   Dyslipidemia 10/25/2018   Essential hypertension 04/28/2018   Changed arb to CCB 04/24/2018 due to cough x one month trial    Hyperlipidemia    Hypertension    Hypertensive disorder 04/18/2018   Multiple pulmonary nodules determined by computed tomography of lung 01/26/2018   Passive smoke exp/ remote  Chest CT  09/24/17 MPN's  Largest 4 x 6 mm R laterally  > rec f/u 09/14/18 (reminder file)   As  pt reports neg CT 2015 (not avail)  - ESR 01/25/2018 = 35  - CT chest 07/11/18 =  5 mm > no f/u needed    Primary osteoarthritis of left hip 11/26/2019   Primary osteoarthritis of left knee 03/11/2020   Rectal pain    Sinus bradycardia 10/25/2018   Unilateral primary osteoarthritis, left knee 06/14/2018   Upper airway cough syndrome 01/25/2018   Onset 1980 CT head 12/22/17 neg sinus dz FENO 01/25/2018  =   21 - Allergy profile 01/25/2018 >  Eos 0.1 /  IgE  17 RAST neg  - flare 02/12/18 p et  - gabapentin 100 mg tid 03/23/2018 > increased to max of 300 tid 04/24/2018 > improved so increased to 300 qid 06/05/2018 > d/c'd around 02/2019 s flare     No Known Allergies  ROS General: Denies fever, chills, night sweats, changes in weight, changes in appetite HEENT: Denies headaches, ear pain, changes in vision, rhinorrhea, sore throat  +nasal congestion, scratchy throat, increased drainage CV: Denies CP, palpitations, SOB, orthopnea Pulm: Denies SOB, cough, wheezing +cough GI: Denies abdominal pain, nausea, vomiting, diarrhea, constipation GU: Denies dysuria, hematuria, frequency, vaginal discharge Msk: Denies muscle cramps, joint pains Neuro: Denies weakness, numbness, tingling Skin: Denies rashes, bruising  +puncture of L foot Psych: Denies depression, anxiety, hallucinations      Objective:  Blood pressure (!) 176/104, pulse (!) 59, temperature 98.9 F (37.2 C), temperature source Oral, weight 212 lb 6.4 oz (96.3 kg), SpO2 99 %.  Gen. Pleasant, well-nourished, in no distress, normal affect   HEENT: [place]/AT, face symmetric, conjunctiva clear, no scleral icterus, PERRLA, EOMI, nares patent without drainage, pharynx without erythema or exudate.  Tms normal b/l. Lungs: no accessory muscle use, CTAB, no wheezes or rales Cardiovascular: RRR, no m/r/g, no peripheral edema Neuro:  A&Ox3, CN II-XII intact, normal gait Skin:  Warm, no lesions/ rash.  Puncture on dorsum on L foot at proximal to 1st MTP joint with  surrounding induration and callous formation.  TTP of wound with scant purulent drainage expressed.  15 blade scapel used to pair down callus. Small black sliver noted underneath skin.  No foreign body removed.   Wt Readings from Last 3 Encounters:  06/17/22 212 lb 6.4 oz (96.3 kg)  06/02/22 214 lb (97.1 kg)  05/06/22 213 lb 9.6 oz (96.9 kg)    Lab Results  Component Value Date   WBC 8.9 10/19/2021   HGB 13.4 10/19/2021   HCT 40.8 10/19/2021   PLT 236 10/19/2021   GLUCOSE 124 (H) 10/19/2021   CHOL 267 (H) 03/02/2021   TRIG 95 03/02/2021   HDL 52 03/02/2021   LDLDIRECT 155 (H) 12/14/2021   LDLCALC 199 (H) 03/02/2021   ALT 15 10/19/2021   AST 18 10/19/2021   NA 137 10/19/2021   K 3.9 10/19/2021   CL 104 10/19/2021   CREATININE 1.23 (H) 10/19/2021   BUN 26 (H) 10/19/2021   CO2 26 10/19/2021   INR 0.9 10/19/2021   HGBA1C 5.6 03/09/2021    Assessment/Plan:  Puncture wound of plantar aspect of left foot, initial encounter  -likely caused by a splinter or other foreign body pt likely stepped on  - Plan: cephALEXin (KEFLEX) 500 MG capsule  Post-nasal drainage  -viral vs allergic cause -continue supportive care -given strict precautions - Plan: fexofenadine (ALLEGRA) 180 MG tablet  Throat irritation  Foreign body of toe of left foot with infection  -pt advised to avoid walking around barefoot. - Plan: cephALEXin (KEFLEX) 500 MG capsule  Essential hypertension -uncontrolled.  Possibly 2/2 pain and acute illness -typically controlled at home and in office. -per chart review elevated at Cardiology visit 06/02/22 -will have pt take an additional dose of irbesartan 37.5 mg this evening -monitor bp at home.  For continued elevation > 140/90 start taking a whole tab of irbesartan 75 mg daily.  F/u prn next wk for continued or worsened foot symptoms.  F/u for HTN in 2-4 wks.  Grier Mitts, MD

## 2022-06-17 NOTE — Patient Instructions (Signed)
You can use Epsom salt and warm water to soak your foot daily.

## 2022-06-22 ENCOUNTER — Other Ambulatory Visit: Payer: Self-pay | Admitting: Cardiology

## 2022-07-08 ENCOUNTER — Encounter: Payer: Self-pay | Admitting: Family Medicine

## 2022-07-08 ENCOUNTER — Ambulatory Visit (INDEPENDENT_AMBULATORY_CARE_PROVIDER_SITE_OTHER): Payer: Medicare Other | Admitting: Family Medicine

## 2022-07-08 VITALS — BP 140/88 | HR 67 | Temp 98.4°F | Wt 211.2 lb

## 2022-07-08 DIAGNOSIS — L299 Pruritus, unspecified: Secondary | ICD-10-CM | POA: Diagnosis not present

## 2022-07-08 DIAGNOSIS — I10 Essential (primary) hypertension: Secondary | ICD-10-CM

## 2022-07-08 DIAGNOSIS — W57XXXA Bitten or stung by nonvenomous insect and other nonvenomous arthropods, initial encounter: Secondary | ICD-10-CM

## 2022-07-08 DIAGNOSIS — S91332D Puncture wound without foreign body, left foot, subsequent encounter: Secondary | ICD-10-CM | POA: Diagnosis not present

## 2022-07-08 MED ORDER — HYDROXYZINE HCL 25 MG PO TABS: 25.0000 mg | ORAL_TABLET | Freq: Three times a day (TID) | ORAL | 0 refills | Status: DC | PRN

## 2022-07-08 MED ORDER — TRIAMCINOLONE ACETONIDE 0.1 % EX CREA: 1.0000 | TOPICAL_CREAM | Freq: Two times a day (BID) | CUTANEOUS | 0 refills | Status: DC

## 2022-07-08 NOTE — Progress Notes (Signed)
Subjective:    Patient ID: Nancy Christensen, female    DOB: 1951/06/17, 71 y.o.   MRN: [id number]  Chief Complaint  Patient presents with   Insect Bite    Went to [place],last Friday, got back Wednesday. Was informed about sand fleas while/after there (at beach). Has tried aloe vera on legs and arms, as broke out in hives this morning, due to itching so bad. Has used epsom salt , nothing has been helping. Bites on both legs and arms, and one on neck on left side.     HPI Patient was seen today for acute concern.  Patient return on Wednesday from a trip to [place].  Patient states while on the beach she was bitten by sand fleas while on lounge chair.  Other people on trip experienced similar bites.  Pt with bites on bilateral LEs and bilateral lower upper LEs.  Tried aloe vera and Epsom salt for itching without relief.  States difficult to sleep 2/2 itching.  Also notes breaking out in hives on bilateral inner upper arm this morning.  Patient endorses improvement in puncture wound of left foot.  [place] in the middle with some tenderness surrounding.  No erythema or drainage noted.  Patient was unable to expel splinter.  Patient states BP at home this morning 110/68.  Denies headaches.  Endorses BP likely elevated 2/2 itching.  Past Medical History:  Diagnosis Date   Arthritis    left hip and knee   Atypical chest pain 10/25/2018   Cholecystitis with cholelithiasis 02/11/2018   Chronic right shoulder pain 07/04/2018   Constipation due to outlet dysfunction    Dizziness 10/25/2018   Dyslipidemia 10/25/2018   Essential hypertension 04/28/2018   Changed arb to CCB 04/24/2018 due to cough x one month trial    Hyperlipidemia    Hypertension    Hypertensive disorder 04/18/2018   Multiple pulmonary nodules determined by computed tomography of lung 01/26/2018   Passive smoke exp/ remote  Chest CT  09/24/17 MPN's  Largest 4 x 6 mm R laterally  > rec f/u 09/14/18 (reminder file)   As pt reports neg CT 2015  (not avail)  - ESR 01/25/2018 = 35  - CT chest 07/11/18 =  5 mm > no f/u needed    Primary osteoarthritis of left hip 11/26/2019   Primary osteoarthritis of left knee 03/11/2020   Rectal pain    Sinus bradycardia 10/25/2018   Unilateral primary osteoarthritis, left knee 06/14/2018   Upper airway cough syndrome 01/25/2018   Onset 1980 CT head 12/22/17 neg sinus dz FENO 01/25/2018  =   21 - Allergy profile 01/25/2018 >  Eos 0.1 /  IgE  17 RAST neg  - flare 02/12/18 p et  - gabapentin 100 mg tid 03/23/2018 > increased to max of 300 tid 04/24/2018 > improved so increased to 300 qid 06/05/2018 > d/c'd around 02/2019 s flare     No Known Allergies  ROS General: Denies fever, chills, night sweats, changes in weight, changes in appetite + pruritus HEENT: Denies headaches, ear pain, changes in vision, rhinorrhea, sore throat CV: Denies CP, palpitations, SOB, orthopnea Pulm: Denies SOB, cough, wheezing GI: Denies abdominal pain, nausea, vomiting, diarrhea, constipation GU: Denies dysuria, hematuria, frequency, vaginal discharge Msk: Denies muscle cramps, joint pains Neuro: Denies weakness, numbness, tingling Skin: Denies rashes, bruising +insect bites, rash, mild tenderness of bottom of L foot Psych: Denies depression, anxiety, hallucinations     Objective:    Blood pressure (!) 148/96,  pulse 67, temperature 98.4 F (36.9 C), temperature source Oral, weight 211 lb 3.2 oz (95.8 kg), SpO2 98 %.  Gen. Pleasant, well-nourished, in no distress, normal affect   HEENT: [place]/AT, face symmetric, conjunctiva clear, no scleral icterus, PERRLA, EOMI, nares patent without drainage Lungs: no accessory muscle use, CTAB, no wheezes or rales Cardiovascular: RRR, no m/r/g, no peripheral edema Neuro:  A&Ox3, CN II-XII intact, normal gait Skin:  Warm, dry, intact.  Erythematous, raised papules on b/l LEs to knee and on b/l forearms.  2 similar lesions on dorsum R foot.  No burrows on feet of fingers.  No rash on back, thighs,  chest.  Plantar surface of L foot with callous over puncture wound, mild TTP on edge of callous.  Skin pared with a 10 blade scalpel, no drainage appreciated.  TTP improved after paring.   Wt Readings from Last 3 Encounters:  07/08/22 211 lb 3.2 oz (95.8 kg)  06/17/22 212 lb 6.4 oz (96.3 kg)  06/02/22 214 lb (97.1 kg)    Lab Results  Component Value Date   WBC 8.9 10/19/2021   HGB 13.4 10/19/2021   HCT 40.8 10/19/2021   PLT 236 10/19/2021   GLUCOSE 124 (H) 10/19/2021   CHOL 267 (H) 03/02/2021   TRIG 95 03/02/2021   HDL 52 03/02/2021   LDLDIRECT 155 (H) 12/14/2021   LDLCALC 199 (H) 03/02/2021   ALT 15 10/19/2021   AST 18 10/19/2021   NA 137 10/19/2021   K 3.9 10/19/2021   CL 104 10/19/2021   CREATININE 1.23 (H) 10/19/2021   BUN 26 (H) 10/19/2021   CO2 26 10/19/2021   INR 0.9 10/19/2021   HGBA1C 5.6 03/09/2021    Assessment/Plan:  Pruritus -New problem -2/2 fleabites -Supportive care including antihistamines and topical anti-itch creams -Advised antihistamines may cause drowsiness  - Plan: triamcinolone cream (KENALOG) 0.1 %, hydrOXYzine (ATARAX) 25 MG tablet  Flea bite of multiple sites -New problem -Supportive care -Advised lesions self-limiting -Given precautions - Plan: triamcinolone cream (KENALOG) 0.1 %  Puncture wound of left foot, subsequent encounter -Improving -Calluses pared 2/2 causing pt discomfort.  Consent obtained.  Improvement in discomfort after paring. -Epsom salt soaks to see if splinter will come out of softened skin -For continued worsening symptoms referral to podiatry  Essential hypertension -BP this morning at pt's home controlled at 110/68 -Recheck -Continue current medications including irbesartan 75 mg half tab daily -Continue lifestyle modifications  F/u prn  Grier Mitts, MD

## 2022-07-08 NOTE — Patient Instructions (Addendum)
2 prescriptions were sent to your pharmacy.  One is a topical steroid cream called triamcinolone that you can apply to particularly itchy areas twice a day if needed.  Avoid use on face as may cause skin to lighten.  A prescription for hydroxyzine was also sent to your pharmacy.  This is a pill for itching.  It will likely cause you to feel drowsy.  If 1 tab makes you feel too drowsy you can always try half a tab.  You can use it up to 3 times a day if needed.  Okay to continue using Allegra daily.

## 2022-07-10 ENCOUNTER — Other Ambulatory Visit: Payer: Self-pay | Admitting: Family Medicine

## 2022-07-10 DIAGNOSIS — R0982 Postnasal drip: Secondary | ICD-10-CM

## 2022-07-22 DIAGNOSIS — I8312 Varicose veins of left lower extremity with inflammation: Secondary | ICD-10-CM | POA: Diagnosis not present

## 2022-08-04 ENCOUNTER — Other Ambulatory Visit: Payer: Self-pay | Admitting: Family Medicine

## 2022-08-04 DIAGNOSIS — Z1231 Encounter for screening mammogram for malignant neoplasm of breast: Secondary | ICD-10-CM

## 2022-08-30 ENCOUNTER — Ambulatory Visit
Admission: RE | Admit: 2022-08-30 | Discharge: 2022-08-30 | Disposition: A | Discharge status: Home / Self Care | Payer: Medicare Other | Source: Ambulatory Visit | Attending: Family Medicine | Admitting: Family Medicine

## 2022-08-30 ENCOUNTER — Ambulatory Visit (AMBULATORY_SURGERY_CENTER): Payer: Medicare Other | Admitting: *Deleted

## 2022-08-30 ENCOUNTER — Other Ambulatory Visit: Payer: Self-pay

## 2022-08-30 ENCOUNTER — Other Ambulatory Visit: Payer: Medicare Other

## 2022-08-30 VITALS — Ht 64.5 in | Wt 204.0 lb

## 2022-08-30 DIAGNOSIS — Z1231 Encounter for screening mammogram for malignant neoplasm of breast: Secondary | ICD-10-CM

## 2022-08-30 DIAGNOSIS — Z86010 Personal history of colonic polyps: Secondary | ICD-10-CM

## 2022-08-30 MED ORDER — NA SULFATE-K SULFATE-MG SULF 17.5-3.13-1.6 GM/177ML PO SOLN: 1.0000 | Freq: Once | ORAL | 0 refills | Status: AC

## 2022-08-30 NOTE — Progress Notes (Signed)
Pre visit completed in person.  No egg or soy allergy known to patient  No issues known to pt with past sedation with any surgeries or procedures Patient denies ever being told they had issues or difficulty with intubation  No FH of Malignant Hyperthermia Pt is not on diet pills Pt is not on  home 02  Pt is not on blood thinners  Pt denies issues with constipation  No A fib or A flutter  Pt instructed to use Singlecare.com or GoodRx for a price reduction on prep   

## 2022-08-31 ENCOUNTER — Encounter: Payer: Medicare Other | Admitting: Gastroenterology

## 2022-09-05 ENCOUNTER — Telehealth: Payer: Self-pay | Admitting: Gastroenterology

## 2022-09-05 DIAGNOSIS — Z86010 Personal history of colonic polyps: Secondary | ICD-10-CM

## 2022-09-05 MED ORDER — NA SULFATE-K SULFATE-MG SULF 17.5-3.13-1.6 GM/177ML PO SOLN: 1.0000 | Freq: Once | ORAL | 0 refills | Status: AC

## 2022-09-05 NOTE — Telephone Encounter (Signed)
Inbound call from patient states she is experiencing a cold, she ran a fever over this weekend but does not have covid. Also states she is experiencing diarrhea. Doesn't know if she should proceed with procedure. Please advise.

## 2022-09-05 NOTE — Telephone Encounter (Signed)
Pt. Sick coughing while speaking on the phone,had fever over the weekend and congestion procedure rescheduled on 10/11/21,new instructions sent to pt. And prep sent to [facility] per request.

## 2022-09-05 NOTE — Telephone Encounter (Signed)
Message left with call back # to return call.

## 2022-09-05 NOTE — Telephone Encounter (Signed)
Noted. Thanks.

## 2022-09-07 ENCOUNTER — Encounter: Payer: Self-pay | Admitting: Family Medicine

## 2022-09-07 ENCOUNTER — Telehealth (INDEPENDENT_AMBULATORY_CARE_PROVIDER_SITE_OTHER): Payer: Medicare Other | Admitting: Family Medicine

## 2022-09-07 VITALS — Ht 64.5 in | Wt 204.0 lb

## 2022-09-07 DIAGNOSIS — J069 Acute upper respiratory infection, unspecified: Secondary | ICD-10-CM | POA: Diagnosis not present

## 2022-09-07 MED ORDER — BENZONATATE 100 MG PO CAPS: 100.0000 mg | ORAL_CAPSULE | Freq: Two times a day (BID) | ORAL | 0 refills | Status: DC | PRN

## 2022-09-07 NOTE — Progress Notes (Signed)
Virtual Visit via Video Note  I connected with Nancy Christensen on 09/07/22 at  9:45 AM EST by a video enabled telemedicine application 2/2 EQAST-41 pandemic and verified that I am speaking with the correct person using two identifiers.  Location patient: home Location provider:work or home office Persons participating in the virtual visit: patient, provider  I discussed the limitations of evaluation and management by telemedicine and the availability of in person appointments. The patient expressed understanding and agreed to proceed.  Chief Complaint  Patient presents with   Cough    Pt reports sx of cough, congestion, headache, sore throat, a little fatigue, low grade fever on Sat., diarrhea on Monday. Denied bodyache. Sx started on Saturday. Tested negative for covid on Monday. Pt states she is taking robitussin DM, Mucinex DM and nasal congestion.     HPI: Pt is a 71 yo female with pmh sig for OA, HLD, HTN, history of pulmonary nodules on CT who was seen for acute concern.   Pt with lightheadedness, cough, congestion, mild HA, increased phlegm production, sore throat.  Sore throat started Saturday.  Improved after gargling.  Low grade fever 99.1 F on Sunday with congestion and cough.  This am cough increased.  Loose stools on Monday am.  Denies ear pain/pressure, n/v, loss of taste or smell, SOB, sick contacts.  COVID test Negative on Monday night.     Tried robitussin DM, Mucinex DM, Vicks VapoRub.   ROS: See pertinent positives and negatives per HPI.  Past Medical History:  Diagnosis Date   Arthritis    left hip and knee   Atypical chest pain 10/25/2018   Cholecystitis with cholelithiasis 02/11/2018   Chronic right shoulder pain 07/04/2018   Constipation due to outlet dysfunction    Dizziness 10/25/2018   Dyslipidemia 10/25/2018   Essential hypertension 04/28/2018   Changed arb to CCB 04/24/2018 due to cough x one month trial    Hyperlipidemia    Hypertension    Hypertensive  disorder 04/18/2018   Multiple pulmonary nodules determined by computed tomography of lung 01/26/2018   Passive smoke exp/ remote  Chest CT  09/24/17 MPN's  Largest 4 x 6 mm R laterally  > rec f/u 09/14/18 (reminder file)   As pt reports neg CT 2015 (not avail)  - ESR 01/25/2018 = 35  - CT chest 07/11/18 =  5 mm > no f/u needed    Primary osteoarthritis of left hip 11/26/2019   Primary osteoarthritis of left knee 03/11/2020   Rectal pain    Sinus bradycardia 10/25/2018   Unilateral primary osteoarthritis, left knee 06/14/2018   Upper airway cough syndrome 01/25/2018   Onset 1980 CT head 12/22/17 neg sinus dz FENO 01/25/2018  =   21 - Allergy profile 01/25/2018 >  Eos 0.1 /  IgE  17 RAST neg  - flare 02/12/18 p et  - gabapentin 100 mg tid 03/23/2018 > increased to max of 300 tid 04/24/2018 > improved so increased to 300 qid 06/05/2018 > d/c'd around 02/2019 s flare     Past Surgical History:  Procedure Laterality Date   ABDOMINAL HYSTERECTOMY  1997   ANAL RECTAL MANOMETRY N/A 08/09/2019   Procedure: ANO RECTAL MANOMETRY;  Surgeon: Thornton Park, MD;  Location: [facility];  Service: Gastroenterology;  Laterality: N/A;   BREAST SURGERY  2002   left cyst removal    CHOLECYSTECTOMY N/A 02/12/2018   Procedure: LAPAROSCOPIC CHOLECYSTECTOMY;  Surgeon: Leighton Ruff, MD;  Location: [facility];  Service: General;  Laterality: N/A;   COLONOSCOPY     POLYPECTOMY      Family History  Problem Relation Age of Onset   Diabetes Mother    Hypertension Mother    Thyroid disease Mother    Vascular Disease Mother    Lung cancer Father    Colon cancer Maternal Uncle    Colon polyps Neg Hx    Esophageal cancer Neg Hx    Stomach cancer Neg Hx    Rectal cancer Neg Hx    Breast cancer Neg Hx     Current Outpatient Medications:    Bempedoic Acid (NEXLETOL) 180 MG TABS, Take 1 tablet by mouth daily., Disp: 96.42 tablet, Rfl: 6   cholecalciferol (VITAMIN D3) 25 MCG (1000 UT) tablet, Take 1,000 Units by mouth daily., Disp:  , Rfl:    fexofenadine (ALLEGRA) 180 MG tablet, TAKE 1 TABLET BY MOUTH EVERY DAY (Patient taking differently: Take 180 mg by mouth as needed.), Disp: 90 tablet, Rfl: 0   irbesartan (AVAPRO) 75 MG tablet, TAKE 1/2 TABLETS BY MOUTH DAILY, Disp: 45 tablet, Rfl: 3   BILE SALTS-CAPS-CASC-PHENOLPTH PO, Take by mouth daily as needed (for digestion). (Patient not taking: Reported on 09/07/2022), Disp: , Rfl:    hydrOXYzine (ATARAX) 25 MG tablet, Take 1 tablet (25 mg total) by mouth 3 (three) times daily as needed. (Patient not taking: Reported on 08/30/2022), Disp: 30 tablet, Rfl: 0   triamcinolone cream (KENALOG) 0.1 %, Apply 1 Application topically 2 (two) times daily. (Patient not taking: Reported on 09/07/2022), Disp: 30 g, Rfl: 0  EXAM:  VITALS per patient if applicable:  RR between 12-20 bpm  GENERAL: alert, oriented, appears well and in no acute distress  HEENT: atraumatic, conjunctiva clear, no obvious abnormalities on inspection of external nose and ears  NECK: normal movements of the head and neck  LUNGS: on inspection no signs of respiratory distress, breathing rate appears normal, no obvious gross SOB, gasping or wheezing  CV: no obvious cyanosis  MS: moves all visible extremities without noticeable abnormality  PSYCH/NEURO: pleasant and cooperative, no obvious depression or anxiety, speech and thought processing grossly intact  ASSESSMENT AND PLAN:  Discussed the following assessment and plan:  Viral URI with cough  -COVID testing - 09/05/2022.  Consider retesting today.  Advised symptoms could also be due to influenza virus or other viral illness. -Continue supportive care with OTC cough/cold medications.  Given history of high blood pressure discussed using medications for people with HTN such as Coricidin HBP, Mucinex HBP, etc. -Continue other supportive care including rest, hydration, gargling with warm salt water/Chloraseptic spray, Flonase or saline nasal rinse. -Tessalon  sent to pharmacy for cough -Given strict precautions - Plan: benzonatate (TESSALON) 100 MG capsule  F/u prn for continued or worsening symptoms.  Patient also advised to contact clinic if repeat COVID test positive.   I discussed the assessment and treatment plan with the patient. The patient was provided an opportunity to ask questions and all were answered. The patient agreed with the plan and demonstrated an understanding of the instructions.   The patient was advised to call back or seek an in-person evaluation if the symptoms worsen or if the condition fails to improve as anticipated.   Billie Ruddy, MD

## 2022-09-08 ENCOUNTER — Encounter: Payer: Medicare Other | Admitting: Gastroenterology

## 2022-09-08 ENCOUNTER — Telehealth: Payer: Self-pay | Admitting: Family Medicine

## 2022-09-08 NOTE — Telephone Encounter (Signed)
Pt was just seen via VV yesterday, 09/07/22.  Pt is now reporting eye redness, swelling/puffy eyelid, feeling some sort of filmy, gritty white substance, discharge.  Please advise.

## 2022-09-10 ENCOUNTER — Telehealth: Payer: Medicare Other | Admitting: Physician Assistant

## 2022-09-10 DIAGNOSIS — R0981 Nasal congestion: Secondary | ICD-10-CM | POA: Diagnosis not present

## 2022-09-10 DIAGNOSIS — H1031 Unspecified acute conjunctivitis, right eye: Secondary | ICD-10-CM

## 2022-09-10 MED ORDER — DOXYCYCLINE HYCLATE 100 MG PO TABS: 100.0000 mg | ORAL_TABLET | Freq: Two times a day (BID) | ORAL | 0 refills | Status: DC

## 2022-09-10 MED ORDER — POLYMYXIN B-TRIMETHOPRIM 10000-0.1 UNIT/ML-% OP SOLN: 2.0000 [drp] | Freq: Four times a day (QID) | OPHTHALMIC | 0 refills | Status: DC

## 2022-09-10 NOTE — Progress Notes (Signed)
Virtual Visit Consent   Nancy Christensen, you are scheduled for a virtual visit with a [facility] provider today. Just as with appointments in the office, your consent must be obtained to participate. Your consent will be active for this visit and any virtual visit you may have with one of our providers in the next 365 days. If you have a MyChart account, a copy of this consent can be sent to you electronically.  As this is a virtual visit, video technology does not allow for your provider to perform a traditional examination. This may limit your provider's ability to fully assess your condition. If your provider identifies any concerns that need to be evaluated in person or the need to arrange testing (such as labs, EKG, etc.), we will make arrangements to do so. Although advances in technology are sophisticated, we cannot ensure that it will always work on either your end or our end. If the connection with a video visit is poor, the visit may have to be switched to a telephone visit. With either a video or telephone visit, we are not always able to ensure that we have a secure connection.  By engaging in this virtual visit, you consent to the provision of healthcare and authorize for your insurance to be billed (if applicable) for the services provided during this visit. Depending on your insurance coverage, you may receive a charge related to this service.  I need to obtain your verbal consent now. Are you willing to proceed with your visit today? Nancy Christensen has provided verbal consent on 09/10/2022 for a virtual visit (video or telephone). Nancy Christensen, [place]  Date: 09/10/2022 10:30 AM  Virtual Visit via Video Note   I, Nancy Christensen, connected with  Nancy Christensen  ([id number], [birth year]) on 09/10/22 at 10:30 AM EST by a video-enabled telemedicine application and verified that I am speaking with the correct person using two identifiers.  Location: Patient: Virtual Visit Location  Patient: Home Provider: Virtual Visit Location Provider: Home Office   I discussed the limitations of evaluation and management by telemedicine and the availability of in person appointments. The patient expressed understanding and agreed to proceed.    History of Present Illness: Nancy Christensen is a 71 y.o. who identifies as a female who was assigned female at birth, and is being seen today for cough and conjunctivitis.  Patient saw her PCP on 09/07/22 for video visit for viral URI and cough. She was prescribed tessalon perles. She is taking these with moderate improvement.  Sx started Saturday (1 week ago). Some symptoms such as runny nose and appetite have improved. Cough remains the same. She has had red and mucousy eye for the past 24 - 48 hours and is concerned with this. Has done 3 COVID tests and all were negative.  Denies changes in vision or severe eye pain. No fever.  HPI: HPI  Problems:  Patient Active Problem List   Diagnosis Date Noted   Puncture wound of toe of left foot 05/06/2022   Hypertension    Hyperlipidemia    Arthritis    Primary osteoarthritis of left knee 03/11/2020   Primary osteoarthritis of left hip 11/26/2019   Constipation due to outlet dysfunction    Rectal pain    Dizziness 10/25/2018   Sinus bradycardia 10/25/2018   Atypical chest pain 10/25/2018   Dyslipidemia 10/25/2018   Chronic right shoulder pain 07/04/2018   Unilateral primary osteoarthritis, left knee 06/14/2018   Essential hypertension 04/28/2018  Hypertensive disorder 04/18/2018   Cholecystitis with cholelithiasis 02/11/2018   Multiple pulmonary nodules determined by computed tomography of lung 01/26/2018   Upper airway cough syndrome 01/25/2018    Allergies: No Known Allergies Medications:  Current Outpatient Medications:    doxycycline (VIBRA-TABS) 100 MG tablet, Take 1 tablet (100 mg total) by mouth 2 (two) times daily., Disp: 10 tablet, Rfl: 0   trimethoprim-polymyxin b  (POLYTRIM) ophthalmic solution, Place 2 drops into the right eye every 6 (six) hours., Disp: 10 mL, Rfl: 0   Bempedoic Acid (NEXLETOL) 180 MG TABS, Take 1 tablet by mouth daily., Disp: 96.42 tablet, Rfl: 6   benzonatate (TESSALON) 100 MG capsule, Take 1 capsule (100 mg total) by mouth 2 (two) times daily as needed for cough., Disp: 20 capsule, Rfl: 0   cholecalciferol (VITAMIN D3) 25 MCG (1000 UT) tablet, Take 1,000 Units by mouth daily., Disp: , Rfl:    fexofenadine (ALLEGRA) 180 MG tablet, TAKE 1 TABLET BY MOUTH EVERY DAY (Patient taking differently: Take 180 mg by mouth as needed.), Disp: 90 tablet, Rfl: 0   irbesartan (AVAPRO) 75 MG tablet, TAKE 1/2 TABLETS BY MOUTH DAILY, Disp: 45 tablet, Rfl: 3  Observations/Objective: Patient is well-developed, well-nourished in no acute distress.  Resting comfortably  at home.  R eye with injected conjunctiva and clear discharge; normal EOM Head is normocephalic, atraumatic.  No labored breathing.  Speech is clear and coherent with logical content.  Patient is alert and oriented at baseline.   Assessment and Plan: Sinus congestion; Acute conjunctivitis of right eye, unspecified acute conjunctivitis type No red flags on discussion or visual exam.  Will initiate doxycycline for sinusitis and polytrim for conjunctivitis per orders. Discussed taking medications as prescribed. Reviewed return precautions including worsening fever, SOB, worsening cough or other concerns. Push fluids and rest. I recommend that patient follow-up if symptoms worsen or persist despite treatment x 7-10 days, sooner if needed.   Follow Up Instructions: I discussed the assessment and treatment plan with the patient. The patient was provided an opportunity to ask questions and all were answered. The patient agreed with the plan and demonstrated an understanding of the instructions.  A copy of instructions were sent to the patient via MyChart unless otherwise noted below.   The  patient was advised to call back or seek an in-person evaluation if the symptoms worsen or if the condition fails to improve as anticipated.  Time:  I spent 5-10 minutes with the patient via telehealth technology discussing the above problems/concerns.    Nancy Christensen, [place]

## 2022-09-13 ENCOUNTER — Telehealth: Payer: Self-pay | Admitting: Family Medicine

## 2022-09-13 NOTE — Telephone Encounter (Signed)
Pt requesting something to help with the cough.

## 2022-09-13 NOTE — Telephone Encounter (Signed)
Pt is aware md does not work on Tuesday and will be in office tomorrow

## 2022-09-16 NOTE — Telephone Encounter (Signed)
Would suggest in person visit as pt seen virtually x 2 for symptoms and given abx.

## 2022-09-20 NOTE — Telephone Encounter (Signed)
Attempt to reach pt. Left a voicemail to call us back.  

## 2022-09-20 NOTE — Telephone Encounter (Signed)
Pt called, returning CMA's call. CMA was unavailable. Pt asked that CMA call back at their earliest convenience. 

## 2022-09-20 NOTE — Telephone Encounter (Signed)
Spoke to patient. She updates her cough has subsided. She completed abx and takes coricidin. Coughing up clear mucus. Inform her to reach out to [place] if have any questions or concerns.

## 2022-10-03 ENCOUNTER — Encounter: Payer: Self-pay | Admitting: *Deleted

## 2022-10-03 NOTE — Progress Notes (Signed)
[facility] Quality Team Note  Name: Nancy Christensen Date of Birth: 03/12/1951 MRN: [id number] Date: 10/03/2022  [facility] Quality Team has reviewed this patient's chart, please see recommendations below:  Controlling Blood Pressure; Scheduled appt with office to help close gap.  Last bp was out of range.  Routing as FYI.  Pt coming in on 10/19/2022.

## 2022-10-10 ENCOUNTER — Telehealth: Payer: Self-pay | Admitting: Gastroenterology

## 2022-10-10 NOTE — Telephone Encounter (Signed)
Spoke with pt and new prep instructions reviewed with her

## 2022-10-10 NOTE — Telephone Encounter (Signed)
Patient is calling to reschedule procedure due to failed prep, seeking a new prep sheet. Please advise

## 2022-10-11 ENCOUNTER — Encounter: Payer: Medicare Other | Admitting: Gastroenterology

## 2022-10-14 DIAGNOSIS — R253 Fasciculation: Secondary | ICD-10-CM | POA: Diagnosis not present

## 2022-10-14 DIAGNOSIS — F439 Reaction to severe stress, unspecified: Secondary | ICD-10-CM | POA: Diagnosis not present

## 2022-10-14 DIAGNOSIS — R519 Headache, unspecified: Secondary | ICD-10-CM | POA: Diagnosis not present

## 2022-10-19 ENCOUNTER — Encounter: Payer: Self-pay | Admitting: Family Medicine

## 2022-10-19 ENCOUNTER — Ambulatory Visit: Payer: Medicare Other | Admitting: Family Medicine

## 2022-10-19 ENCOUNTER — Ambulatory Visit (INDEPENDENT_AMBULATORY_CARE_PROVIDER_SITE_OTHER): Payer: Medicare Other | Admitting: Family Medicine

## 2022-10-19 VITALS — BP 161/92 | HR 52 | Temp 97.7°F | Ht 64.5 in | Wt 212.0 lb

## 2022-10-19 DIAGNOSIS — R7989 Other specified abnormal findings of blood chemistry: Secondary | ICD-10-CM | POA: Diagnosis not present

## 2022-10-19 DIAGNOSIS — I10 Essential (primary) hypertension: Secondary | ICD-10-CM | POA: Diagnosis not present

## 2022-10-19 DIAGNOSIS — F439 Reaction to severe stress, unspecified: Secondary | ICD-10-CM

## 2022-10-19 NOTE — Addendum Note (Signed)
Addended by: Octavio Manns E on: 10/19/2022 10:30 AM   Modules accepted: Orders

## 2022-10-19 NOTE — Progress Notes (Signed)
   Established Patient Office Visit   Subjective  Patient ID: Nancy Christensen, female    DOB: 04-27-1951  Age: 72 y.o. MRN: [id number]  Chief Complaint  Patient presents with   Medical Management of Chronic Issues    Follow up on BP. Went to [facility] for headache. Pt reports BP was 129/78 at urgent care.     Patient seen for follow-up on BP.  Went to UC with pt for headache.  BP was elevated.  Workup largely negative with the exception of creatinine at 1.23.  Patient notes increased stress causing her to eat.  Patient eating more processed foods including [employer], [place] fries, potato chips.      ROS Negative unless stated above    Objective:     BP (!) 161/92 (BP Location: Right Arm, Cuff Size: Large)   Pulse (!) 52   Temp 97.7 F (36.5 C) (Oral)   Ht 5' 4.5" (1.638 m)   Wt 212 lb (96.2 kg)   SpO2 99%   BMI 35.83 kg/m    Physical Exam Constitutional:      Appearance: Normal appearance.  HENT:     Head: Normocephalic and atraumatic.     Nose: Nose normal.     Mouth/Throat:     Mouth: Mucous membranes are moist.  Eyes:     Extraocular Movements: Extraocular movements intact.     Conjunctiva/sclera: Conjunctivae normal.     Pupils: Pupils are equal, round, and reactive to light.  Cardiovascular:     Rate and Rhythm: Normal rate and regular rhythm.     Pulses: Normal pulses.     Heart sounds: Normal heart sounds.  Pulmonary:     Effort: Pulmonary effort is normal.     Breath sounds: Normal breath sounds.  Skin:    General: Skin is warm and dry.  Neurological:     Mental Status: She is alert and oriented to person, place, and time. Mental status is at baseline.      No results found for any visits on 10/19/22.    Assessment & Plan:  Essential hypertension -Elevated.  Likely 2/2 increased stress and sodium intake. -Will have pt monitor bp for the next wk while making lifestyle modifications.  If consistently greater than 140/90 increase irbesartan from 75 mg  to 150 mg daily and notify clinic. -Will have patient return in 1 week for lab visit -     Basic metabolic panel; Future -     TSH; Future -     T4, free; Future  Stress -Congratulated on starting counseling -Discussed self-care -     TSH; Future -     T4, free; Future  Elevated serum creatinine -     Basic metabolic panel; Future    Return in about 6 weeks (around 11/30/2022) for blood pressure re-check.   Billie Ruddy, MD

## 2022-10-19 NOTE — Patient Instructions (Signed)
Continue to monitor your blood pressure at home.  For elevations consistently greater than 140/90 (either number) increase irbesartan from 75 mg to 150 mg daily and notify clinic.  Lab orders were placed to recheck your creatinine as well as to check thyroid to see if it is contributing to your elevation in blood pressure.  You can have set up a lab appointment to have these drawn next week.  Work to decrease your sodium intake.

## 2022-10-20 ENCOUNTER — Encounter: Payer: Medicare Other | Admitting: Gastroenterology

## 2022-10-20 ENCOUNTER — Telehealth: Payer: Self-pay | Admitting: Family Medicine

## 2022-10-20 ENCOUNTER — Other Ambulatory Visit (INDEPENDENT_AMBULATORY_CARE_PROVIDER_SITE_OTHER): Payer: Medicare Other

## 2022-10-20 DIAGNOSIS — F439 Reaction to severe stress, unspecified: Secondary | ICD-10-CM

## 2022-10-20 DIAGNOSIS — I10 Essential (primary) hypertension: Secondary | ICD-10-CM

## 2022-10-20 DIAGNOSIS — R7989 Other specified abnormal findings of blood chemistry: Secondary | ICD-10-CM | POA: Diagnosis not present

## 2022-10-20 LAB — BASIC METABOLIC PANEL
BUN: 27 mg/dL — ABNORMAL HIGH (ref 6–23)
CO2: 27 mEq/L (ref 19–32)
Calcium: 9.5 mg/dL (ref 8.4–10.5)
Chloride: 106 mEq/L (ref 96–112)
Creatinine, Ser: 1.37 mg/dL — ABNORMAL HIGH (ref 0.40–1.20)
GFR: 38.59 mL/min — ABNORMAL LOW (ref >60.00)
Glucose, Bld: 91 mg/dL (ref 70–99)
Potassium: 4.6 mEq/L (ref 3.5–5.1)
Sodium: 140 mEq/L (ref 135–145)

## 2022-10-20 LAB — TSH: TSH: 2.98 u[IU]/mL (ref 0.35–5.50)

## 2022-10-20 LAB — T4, FREE: Free T4: 0.9 ng/dL (ref 0.60–1.60)

## 2022-10-20 NOTE — Telephone Encounter (Signed)
Patient needs new prescription for irbesartan (AVAPRO) 75 MG tablet   Patient is only being given enough for half doses but she was increased to full doses.    [facility]   [id number]: [phone] Fax: [phone]       Please advise

## 2022-10-24 ENCOUNTER — Telehealth: Payer: Self-pay | Admitting: Gastroenterology

## 2022-10-24 MED ORDER — IRBESARTAN 75 MG PO TABS: ORAL_TABLET | ORAL | 3 refills | Status: DC

## 2022-10-24 NOTE — Telephone Encounter (Signed)
Called the patient back to confirm that she had the proper times for her prep for this pm and in the am as she had been rescheduled. Pt verbalized understanding.

## 2022-10-24 NOTE — Telephone Encounter (Signed)
FYI  A refill sent. Direction change to one tablet a day.

## 2022-10-24 NOTE — Telephone Encounter (Signed)
Inbound call from patient requesting a call back to discuss prep instructions for tomorrow 1/31 procedure. Please advise.

## 2022-10-24 NOTE — Telephone Encounter (Signed)
Spoke to pt. Pt reports she had started taking full tablet of 75 mg of Irbesartan on 10/20/2023. Her BP been at 140/ 82, 138/78, 140/82 and today was 130/72.   Pt had thought she was taking 150 mg when she was taking a full tablet  of 75 mg. Explain to pt, she is on '75mg'$  dose, unless she is taking 2 tablets of '75mg'$ . Patient denied taking 2 tablets and come to understand that he is on '75mg'$ . Requesting a refill.

## 2022-10-25 ENCOUNTER — Encounter: Payer: Self-pay | Admitting: Gastroenterology

## 2022-10-25 ENCOUNTER — Ambulatory Visit (AMBULATORY_SURGERY_CENTER): Payer: Medicare Other | Admitting: Gastroenterology

## 2022-10-25 VITALS — BP 117/67 | HR 50 | Temp 98.2°F | Resp 20 | Ht 65.5 in | Wt 212.0 lb

## 2022-10-25 DIAGNOSIS — E785 Hyperlipidemia, unspecified: Secondary | ICD-10-CM | POA: Diagnosis not present

## 2022-10-25 DIAGNOSIS — Z86010 Personal history of colonic polyps: Secondary | ICD-10-CM | POA: Diagnosis not present

## 2022-10-25 DIAGNOSIS — D128 Benign neoplasm of rectum: Secondary | ICD-10-CM

## 2022-10-25 DIAGNOSIS — Z09 Encounter for follow-up examination after completed treatment for conditions other than malignant neoplasm: Secondary | ICD-10-CM

## 2022-10-25 DIAGNOSIS — I10 Essential (primary) hypertension: Secondary | ICD-10-CM | POA: Diagnosis not present

## 2022-10-25 DIAGNOSIS — K621 Rectal polyp: Secondary | ICD-10-CM | POA: Diagnosis not present

## 2022-10-25 MED ORDER — SODIUM CHLORIDE 0.9 % IV SOLN: 500.0000 mL | Freq: Once | INTRAVENOUS | Status: DC

## 2022-10-25 NOTE — Progress Notes (Signed)
To recovery, /vss, care resumed by PACU RN

## 2022-10-25 NOTE — Op Note (Signed)
[place] Patient Name: Nancy Christensen Procedure Date: 10/25/2022 11:19 AM MRN: [id number] Endoscopist: Thornton Park MD, MD, [id number] Age: 72 Referring MD:  Date of Birth: Oct 04, 1950 Gender: Female Account #: [id number] Procedure:                Colonoscopy Indications:              High risk colon cancer surveillance: Personal                            history of multiple (3 or more) adenomas                           - benign colonic fold on colonoscopy in [place],                            [place] 07/30/12                           - 3 tubular adenomas on colonoscopy 07/12/19 Medicines:                Monitored Anesthesia Care Procedure:                Pre-Anesthesia Assessment:                           - Prior to the procedure, a History and Physical                            was performed, and patient medications and                            allergies were reviewed. The patient's tolerance of                            previous anesthesia was also reviewed. The risks                            and benefits of the procedure and the sedation                            options and risks were discussed with the patient.                            All questions were answered, and informed consent                            was obtained. Prior Anticoagulants: The patient has                            taken no anticoagulant or antiplatelet agents. ASA                            Grade Assessment: II - A patient with mild systemic  disease. After reviewing the risks and benefits,                            the patient was deemed in satisfactory condition to                            undergo the procedure.                           After obtaining informed consent, the colonoscope                            was passed under direct vision. Throughout the                            procedure, the patient's blood pressure, pulse, and                             oxygen saturations were monitored continuously. The                            CF [id number] #[id number] was introduced through the anus                            and advanced to the 3 cm into the ileum. A second                            forward view of the right colon was performed. The                            colonoscopy was performed without difficulty. The                            patient tolerated the procedure well. The quality                            of the bowel preparation was excellent. The                            terminal ileum, ileocecal valve, appendiceal                            orifice, and rectum were photographed. Scope In: 11:26:45 AM Scope Out: 11:42:09 AM Scope Withdrawal Time: 0 hours 11 minutes 1 second  Total Procedure Duration: 0 hours 15 minutes 24 seconds  Findings:                 Non-bleeding prolapsed internal hemorrhoids were                            found.                           Multiple medium-mouthed and small-mouthed  diverticula were found in the sigmoid colon,                            descending colon, transverse colon and ascending                            colon.                           A 3 mm polyp was found in the rectum. The polyp was                            flat. The polyp was removed with a cold snare.                            Resection and retrieval were complete. Estimated                            blood loss was minimal.                           The exam was otherwise without abnormality on                            direct and retroflexion views. Complications:            No immediate complications. Estimated Blood Loss:     Estimated blood loss was minimal. Impression:               - Non-bleeding prolapsed internal hemorrhoids.                           - Diverticulosis in the sigmoid colon, in the                            descending colon, in the transverse colon and in                             the ascending colon.                           - One 3 mm polyp in the rectum, removed with a cold                            snare. Resected and retrieved.                           - The examination was otherwise normal on direct                            and retroflexion views. Recommendation:           - Patient has a contact number available for                            emergencies. The signs and  symptoms of potential                            delayed complications were discussed with the                            patient. Return to normal activities tomorrow.                            Written discharge instructions were provided to the                            patient.                           - High fiber diet. Consider adding a daily dose of                            Metamucil or Benefiber.                           - Continue present medications.                           - Await pathology results.                           - Repeat colonoscopy in 5 years for surveillance if                            it is clinically appropriate at that time.                           - Emerging evidence supports eating a diet of                            fruits, vegetables, grains, calcium, and yogurt                            while reducing red meat and alcohol may reduce the                            risk of colon cancer.                           - Thank you for allowing me to be involved in your                            colon cancer prevention. Thornton Park MD, MD 10/25/2022 11:46:53 AM This report has been signed electronically.

## 2022-10-25 NOTE — Patient Instructions (Signed)
Handouts provided on polyps, diverticulosis and hemorrhoids.   Follow a high fiber diet (see handout). Drink at least 64 ounces of water daily. Add a daily stool bulking agent such as psyllium (an example would be Metamucil or Benefiber).  Continue present medications. Await pathology results.   Repeat colonoscopy in 5 years for surveillance if it is clinically appropriate at that time.    YOU HAD AN ENDOSCOPIC PROCEDURE TODAY AT [facility]:   Refer to the procedure report that was given to you for any specific questions about what was found during the examination.  If the procedure report does not answer your questions, please call your gastroenterologist to clarify.  If you requested that your care partner not be given the details of your procedure findings, then the procedure report has been included in a sealed envelope for you to review at your convenience later.  YOU SHOULD EXPECT: Some feelings of bloating in the abdomen. Passage of more gas than usual.  Walking can help get rid of the air that was put into your GI tract during the procedure and reduce the bloating. If you had a lower endoscopy (such as a colonoscopy or flexible sigmoidoscopy) you may notice spotting of blood in your stool or on the toilet paper. If you underwent a bowel prep for your procedure, you may not have a normal bowel movement for a few days.  Please Note:  You might notice some irritation and congestion in your nose or some drainage.  This is from the oxygen used during your procedure.  There is no need for concern and it should clear up in a day or so.  SYMPTOMS TO REPORT IMMEDIATELY:  Following lower endoscopy (colonoscopy or flexible sigmoidoscopy):  Excessive amounts of blood in the stool  Significant tenderness or worsening of abdominal pains  Swelling of the abdomen that is new, acute  Fever of 100F or higher  For urgent or emergent issues, a gastroenterologist can be reached at any  hour by calling [phone]. Do not use MyChart messaging for urgent concerns.    DIET:  We do recommend a small meal at first, but then you may proceed to your regular diet.  Drink plenty of fluids but you should avoid alcoholic beverages for 24 hours.  ACTIVITY:  You should plan to take it easy for the rest of today and you should NOT DRIVE or use heavy machinery until tomorrow (because of the sedation medicines used during the test).    FOLLOW UP: Our staff will call the number listed on your records the next business day following your procedure.  We will call around 7:15- 8:00 am to check on you and address any questions or concerns that you may have regarding the information given to you following your procedure. If we do not reach you, we will leave a message.     If any biopsies were taken you will be contacted by phone or by letter within the next 1-3 weeks.  Please call us at [phone] if you have not heard about the biopsies in 3 weeks.    SIGNATURES/CONFIDENTIALITY: You and/or your care partner have signed paperwork which will be entered into your electronic medical record.  These signatures attest to the fact that that the information above on your After Visit Summary has been reviewed and is understood.  Full responsibility of the confidentiality of this discharge information lies with you and/or your care-partner.

## 2022-10-25 NOTE — Progress Notes (Signed)
Referring Provider: Billie Ruddy, MD Primary Care Physician:  Billie Ruddy, MD  Indication for Procedure:  Colon cancer Surveillance   IMPRESSION:  Need for colon cancer surveillance Appropriate candidate for monitored anesthesia care  PLAN: Colonoscopy in the [place] today   HPI: Nancy Christensen is a 72 y.o. female presents for surveillance colonoscopy.  Prior endoscopic history:    - benign colonic fold on colonoscopy in [place], [place] 07/30/12    - 3 tubular adenomas on colonoscopy 07/12/19    - surveillance recommended in 3 years  No baseline GI symptoms.   No known family history of colon cancer or polyps. No family history of uterine/endometrial cancer, pancreatic cancer or gastric/stomach cancer.   Past Medical History:  Diagnosis Date   Arthritis    left hip and knee   Atypical chest pain 10/25/2018   Cholecystitis with cholelithiasis 02/11/2018   Chronic right shoulder pain 07/04/2018   Constipation due to outlet dysfunction    Dizziness 10/25/2018   Dyslipidemia 10/25/2018   Essential hypertension 04/28/2018   Changed arb to CCB 04/24/2018 due to cough x one month trial    Hyperlipidemia    Hypertension    Hypertensive disorder 04/18/2018   Multiple pulmonary nodules determined by computed tomography of lung 01/26/2018   Passive smoke exp/ remote  Chest CT  09/24/17 MPN's  Largest 4 x 6 mm R laterally  > rec f/u 09/14/18 (reminder file)   As pt reports neg CT 2015 (not avail)  - ESR 01/25/2018 = 35  - CT chest 07/11/18 =  5 mm > no f/u needed    Primary osteoarthritis of left hip 11/26/2019   Primary osteoarthritis of left knee 03/11/2020   Rectal pain    Sinus bradycardia 10/25/2018   Unilateral primary osteoarthritis, left knee 06/14/2018   Upper airway cough syndrome 01/25/2018   Onset 1980 CT head 12/22/17 neg sinus dz FENO 01/25/2018  =   21 - Allergy profile 01/25/2018 >  Eos 0.1 /  IgE  17 RAST neg  - flare 02/12/18 p et  - gabapentin 100 mg tid 03/23/2018 > increased  to max of 300 tid 04/24/2018 > improved so increased to 300 qid 06/05/2018 > d/c'd around 02/2019 s flare     Past Surgical History:  Procedure Laterality Date   ABDOMINAL HYSTERECTOMY  1997   ANAL RECTAL MANOMETRY N/A 08/09/2019   Procedure: ANO RECTAL MANOMETRY;  Surgeon: Thornton Park, MD;  Location: [facility];  Service: Gastroenterology;  Laterality: N/A;   BREAST SURGERY  2002   left cyst removal    CHOLECYSTECTOMY N/A 02/12/2018   Procedure: LAPAROSCOPIC CHOLECYSTECTOMY;  Surgeon: Leighton Ruff, MD;  Location: [facility];  Service: General;  Laterality: N/A;   COLONOSCOPY     POLYPECTOMY      Current Outpatient Medications  Medication Sig Dispense Refill   Bempedoic Acid (NEXLETOL) 180 MG TABS Take 1 tablet by mouth daily. 96.42 tablet 6   cholecalciferol (VITAMIN D3) 25 MCG (1000 UT) tablet Take 1,000 Units by mouth daily.     irbesartan (AVAPRO) 75 MG tablet TAKE ONE TABLETS BY MOUTH DAILY 45 tablet 3   Na Sulfate-K Sulfate-Mg Sulf 17.5-3.13-1.6 GM/177ML SOLN For colonscopy scheduled on 10/25/2022.     No current facility-administered medications for this visit.    Allergies as of 10/25/2022   (No Known Allergies)    Family History  Problem Relation Age of Onset   Diabetes Mother    Hypertension Mother  Thyroid disease Mother    Vascular Disease Mother    Lung cancer Father    Colon cancer Maternal Uncle    Colon polyps Neg Hx    Esophageal cancer Neg Hx    Stomach cancer Neg Hx    Rectal cancer Neg Hx    Breast cancer Neg Hx      Physical Exam: General:   Alert,  well-nourished, pleasant and cooperative in NAD Head:  Normocephalic and atraumatic. Eyes:  Sclera clear, no icterus.   Conjunctiva pink. Mouth:  No deformity or lesions.   Neck:  Supple; no masses or thyromegaly. Lungs:  Clear throughout to auscultation.   No wheezes. Heart:  Regular rate and rhythm; no murmurs. Abdomen:  Soft, non-tender, nondistended, normal bowel sounds, no rebound or  guarding.  Msk:  Symmetrical. No boney deformities LAD: No inguinal or umbilical LAD Extremities:  No clubbing or edema. Neurologic:  Alert and  oriented x4;  grossly nonfocal Skin:  No obvious rash or bruise. Psych:  Alert and cooperative. Normal mood and affect.     Studies/Results: No results found.    Nayleah Gamel L. Tarri Glenn, MD, MPH 10/25/2022, 10:36 AM

## 2022-10-25 NOTE — Progress Notes (Signed)
Called to room to assist during endoscopic procedure.  Patient ID and intended procedure confirmed with present staff. Received instructions for my participation in the procedure from the performing physician.  

## 2022-10-26 ENCOUNTER — Telehealth: Payer: Self-pay

## 2022-10-26 NOTE — Telephone Encounter (Signed)
  Follow up Call-     10/25/2022   10:43 AM  Call back number  Post procedure Call Back phone  # [phone]  Permission to leave phone message Yes     Patient questions:  Do you have a fever, pain , or abdominal swelling? No. Pain Score  0 *  Have you tolerated food without any problems? Yes.    Have you been able to return to your normal activities? Yes.    Do you have any questions about your discharge instructions: Diet   No. Medications  No. Follow up visit  No.  Do you have questions or concerns about your Care? No.  Actions: * If pain score is 4 or above: No action needed, pain <4.

## 2022-10-28 ENCOUNTER — Encounter: Payer: Self-pay | Admitting: Gastroenterology

## 2022-10-31 ENCOUNTER — Ambulatory Visit (INDEPENDENT_AMBULATORY_CARE_PROVIDER_SITE_OTHER): Payer: Medicare Other | Admitting: Podiatry

## 2022-10-31 ENCOUNTER — Encounter: Payer: Self-pay | Admitting: Podiatry

## 2022-10-31 DIAGNOSIS — L309 Dermatitis, unspecified: Secondary | ICD-10-CM | POA: Diagnosis not present

## 2022-10-31 DIAGNOSIS — M6740 Ganglion, unspecified site: Secondary | ICD-10-CM

## 2022-10-31 DIAGNOSIS — B351 Tinea unguium: Secondary | ICD-10-CM

## 2022-11-02 NOTE — Progress Notes (Signed)
Subjective:   Patient ID: Nancy Christensen, female   DOB: 72 y.o.   MRN: [id number]   HPI Patient presents with numerous problems with 1 being thickness of nailbeds secondarily irritation with sweating between the digits and third a small mass underneath the left foot which has been palpable.  Patient states it still not hurting but she is concerned that it may have grown in size   ROS      Objective:  Physical Exam  Neurovascular status intact muscle strength range of motion adequate with patient found to have moderate thickness of the hallux second nailbeds both feet and noted to have slight white discoloration between the lesser digits that she is using antifungal for which seems to be controlling it spray.  Small nodule plantar aspect left third metatarsal measuring about 7 mm x 7 mm appears to be movable within the subcutaneous tissue no other pathology associated with it nontender with nail disease     Assessment:  Reviewed all these different conditions we discussed the small nodule given the fact it has been there for years over 20 I do not recommend excision as it would be difficult to get this out and given that length of time that is very likely that it is benign and nonpainful so we will not remove it.  Continue topical antifungal continue nail debridements do not recommend treatment of nails currently     Plan:  Plan as listed above

## 2022-11-07 ENCOUNTER — Encounter: Payer: Self-pay | Admitting: Family Medicine

## 2022-11-07 ENCOUNTER — Ambulatory Visit (INDEPENDENT_AMBULATORY_CARE_PROVIDER_SITE_OTHER): Payer: Medicare Other | Admitting: Family Medicine

## 2022-11-07 VITALS — BP 150/80 | HR 56 | Temp 98.5°F | Ht 64.5 in | Wt 209.4 lb

## 2022-11-07 DIAGNOSIS — R7989 Other specified abnormal findings of blood chemistry: Secondary | ICD-10-CM | POA: Diagnosis not present

## 2022-11-07 DIAGNOSIS — I10 Essential (primary) hypertension: Secondary | ICD-10-CM | POA: Diagnosis not present

## 2022-11-07 LAB — BASIC METABOLIC PANEL
BUN: 20 mg/dL (ref 6–23)
CO2: 27 mEq/L (ref 19–32)
Calcium: 10.3 mg/dL (ref 8.4–10.5)
Chloride: 102 mEq/L (ref 96–112)
Creatinine, Ser: 1.29 mg/dL — ABNORMAL HIGH (ref 0.40–1.20)
GFR: 41.46 mL/min — ABNORMAL LOW (ref >60.00)
Glucose, Bld: 74 mg/dL (ref 70–99)
Potassium: 4.3 mEq/L (ref 3.5–5.1)
Sodium: 137 mEq/L (ref 135–145)

## 2022-11-07 NOTE — Progress Notes (Signed)
Established Patient Office Visit   Subjective  Patient ID: Nancy Christensen, female    DOB: 08/14/1951  Age: 72 y.o. MRN: [id number]  Chief Complaint  Patient presents with   Medical Management of Chronic Issues    Follow up on BP.     Pt is a 72 yo female seen for f/u on HTN and labs.  At last OFV, bp elevated.  Pt taking irbesartan 75 mg daily.  BP at home 137/76 this morning.  BP range 148-158/72-86.  Patient stopped drinking soda and eating processed foods.  Cooking more at home.  Drinking hibiscus tea and green tea.  Patient also seen to recheck blood work.  Serum creatinine elevated at 1.37 and GFR 38.59 and 10/20/2022.  Previously 1.23 and 47 respectively nearly a year ago.  Patient notes family history of kidney disease as her sister was on dialysis x 12 years before receiving a kidney.  Since last OFV patient had a counseling session.  During which she discovered that she has difficulty relaxing.  Pt has another session scheduled this week.      ROS Negative unless stated above    Objective:     BP (!) 150/80 (BP Location: Right Arm, Patient Position: Sitting, Cuff Size: Large)   Pulse (!) 56   Temp 98.5 F (36.9 C) (Oral)   Ht 5' 4.5" (1.638 m)   Wt 209 lb 6.4 oz (95 kg)   SpO2 99%   BMI 35.39 kg/m    Physical Exam Constitutional:      General: She is not in acute distress.    Appearance: Normal appearance.  HENT:     Head: Normocephalic and atraumatic.     Nose: Nose normal.     Mouth/Throat:     Mouth: Mucous membranes are moist.  Cardiovascular:     Rate and Rhythm: Normal rate and regular rhythm.     Heart sounds: Normal heart sounds. No murmur heard.    No gallop.  Pulmonary:     Effort: Pulmonary effort is normal. No respiratory distress.     Breath sounds: Normal breath sounds. No wheezing, rhonchi or rales.  Skin:    General: Skin is warm and dry.  Neurological:     Mental Status: She is alert and oriented to person, place, and time.        11/07/2022    2:30 PM  GAD 7 : Generalized Anxiety Score  Nervous, Anxious, on Edge 0  Control/stop worrying 0  Worry too much - different things 0  Trouble relaxing 1  Restless 0  Easily annoyed or irritable 0  Afraid - awful might happen 0  Total GAD 7 Score 1  Anxiety Difficulty Not difficult at all      11/07/2022    2:30 PM 10/19/2022    9:31 AM 05/06/2022    4:23 PM  Depression screen PHQ 2/9  Decreased Interest 0 0 0  Down, Depressed, Hopeless 0 0 0  PHQ - 2 Score 0 0 0  Altered sleeping 1 0 0  Tired, decreased energy 0 0 0  Change in appetite 0 0 0  Feeling bad or failure about yourself  1 0 0  Trouble concentrating 0 0 0  Moving slowly or fidgety/restless 0 0 0  Suicidal thoughts 0 0 0  PHQ-9 Score 2 0 0  Difficult doing work/chores Not difficult at all        No results found for any visits on 11/07/22.  Assessment & Plan:  Essential hypertension -     Basic metabolic panel  Elevated serum creatinine -     Basic metabolic panel  BP controlled at home.  A component of whitecoat hypertension likely contributing to elevation of BP in clinic as patient was holding her breath when this provider rechecked her blood pressure.  Will have patient continue irbesartan 75 mg daily.  Continue lifestyle modifications.  Continue monitoring BP at home and keep a log to bring with you to clinic.  Continue counseling as anxiety/stress may also be contributing.  Will repeat BMP.  For continued elevation in creatinine/worsening renal function place referral to nephrology.  Return in about 4 weeks (around 12/05/2022), or if symptoms worsen or fail to improve.   Billie Ruddy, MD

## 2022-11-21 NOTE — Progress Notes (Unsigned)
ACUTE VISIT No chief complaint on file.  HPI: [name] is a 72 y.o. female, who is here today complaining of *** HPI  Review of Systems See other pertinent positives and negatives in HPI.  Current Outpatient Medications on File Prior to Visit  Medication Sig Dispense Refill   Bempedoic Acid (NEXLETOL) 180 MG TABS Take 1 tablet by mouth daily. 96.42 tablet 6   cholecalciferol (VITAMIN D3) 25 MCG (1000 UT) tablet Take 1,000 Units by mouth daily.     irbesartan (AVAPRO) 75 MG tablet TAKE ONE TABLETS BY MOUTH DAILY 45 tablet 3   No current facility-administered medications on file prior to visit.    Past Medical History:  Diagnosis Date   Arthritis    left hip and knee   Atypical chest pain 10/25/2018   Cholecystitis with cholelithiasis 02/11/2018   Chronic right shoulder pain 07/04/2018   Constipation due to outlet dysfunction    Dizziness 10/25/2018   Dyslipidemia 10/25/2018   Essential hypertension 04/28/2018   Changed arb to CCB 04/24/2018 due to cough x one month trial    Hyperlipidemia    Hypertension    Hypertensive disorder 04/18/2018   Multiple pulmonary nodules determined by computed tomography of lung 01/26/2018   Passive smoke exp/ remote  Chest CT  09/24/17 MPN's  Largest 4 x 6 mm R laterally  > rec f/u 09/14/18 (reminder file)   As pt reports neg CT 2015 (not avail)  - ESR 01/25/2018 = 35  - CT chest 07/11/18 =  5 mm > no f/u needed    Primary osteoarthritis of left hip 11/26/2019   Primary osteoarthritis of left knee 03/11/2020   Rectal pain    Sinus bradycardia 10/25/2018   Unilateral primary osteoarthritis, left knee 06/14/2018   Upper airway cough syndrome 01/25/2018   Onset 1980 CT head 12/22/17 neg sinus dz FENO 01/25/2018  =   21 - Allergy profile 01/25/2018 >  Eos 0.1 /  IgE  17 RAST neg  - flare 02/12/18 p et  - gabapentin 100 mg tid 03/23/2018 > increased to max of 300 tid 04/24/2018 > improved so increased to 300 qid 06/05/2018 > d/c'd around 02/2019 s flare    No  Known Allergies  Social History   Socioeconomic History   Marital status: Divorced    Spouse name: Not on file   Number of children: Not on file   Years of education: Not on file   Highest education level: Not on file  Occupational History   Not on file  Tobacco Use   Smoking status: Never   Smokeless tobacco: Never  Vaping Use   Vaping Use: Never used  Substance and Sexual Activity   Alcohol use: Never   Drug use: Never   Sexual activity: Not Currently    Birth control/protection: Post-menopausal  Other Topics Concern   Not on file  Social History Narrative   Not on file   Social Determinants of Health   Financial Resource Strain: Low Risk  (02/22/2022)   Overall Financial Resource Strain (CARDIA)    Difficulty of Paying Living Expenses: Not hard at all  Food Insecurity: No Food Insecurity (02/22/2022)   Hunger Vital Sign    Worried About Running Out of Food in the Last Year: Never true    Ran Out of Food in the Last Year: Never true  Transportation Needs: No Transportation Needs (02/22/2022)   PRAPARE - Hydrologist (Medical): No  Lack of Transportation (Non-Medical): No  Physical Activity: Sufficiently Active (02/22/2022)   Exercise Vital Sign    Days of Exercise per Week: 5 days    Minutes of Exercise per Session: 50 min  Stress: No Stress Concern Present (02/22/2022)   [employer]    Feeling of Stress : Not at all  Social Connections: Moderately Integrated (02/22/2022)   Social Connection and Isolation Panel [NHANES]    Frequency of Communication with Friends and Family: More than three times a week    Frequency of Social Gatherings with Friends and Family: Twice a week    Attends Religious Services: More than 4 times per year    Active Member of [employer] or Organizations: Yes    Attends Music therapist: More than 4 times per year    Marital Status: Divorced     There were no vitals filed for this visit. There is no height or weight on file to calculate BMI.  Physical Exam  ASSESSMENT AND PLAN: There are no diagnoses linked to this encounter.  No follow-ups on file.  Latrail Pounders G. Martinique, MD  [facility]. [place] office.  Discharge Instructions   None

## 2022-11-22 ENCOUNTER — Encounter: Payer: Self-pay | Admitting: Family Medicine

## 2022-11-22 ENCOUNTER — Ambulatory Visit (INDEPENDENT_AMBULATORY_CARE_PROVIDER_SITE_OTHER): Payer: Medicare Other | Admitting: Family Medicine

## 2022-11-22 VITALS — BP 132/80 | HR 76 | Temp 98.5°F | Resp 16 | Ht 64.5 in | Wt 200.2 lb

## 2022-11-22 DIAGNOSIS — R110 Nausea: Secondary | ICD-10-CM

## 2022-11-22 DIAGNOSIS — R1013 Epigastric pain: Secondary | ICD-10-CM

## 2022-11-22 MED ORDER — ONDANSETRON HCL 4 MG PO TABS: 4.0000 mg | ORAL_TABLET | Freq: Three times a day (TID) | ORAL | 0 refills | Status: AC | PRN

## 2022-11-22 MED ORDER — PANTOPRAZOLE SODIUM 40 MG PO TBEC: 40.0000 mg | DELAYED_RELEASE_TABLET | Freq: Every day | ORAL | 0 refills | Status: DC

## 2022-11-22 MED ORDER — TRAMADOL HCL 50 MG PO TABS: 50.0000 mg | ORAL_TABLET | Freq: Two times a day (BID) | ORAL | 0 refills | Status: AC | PRN

## 2022-11-22 NOTE — Patient Instructions (Addendum)
A few things to remember from today's visit:  Epigastric abdominal pain - Plan: CBC, Lipase, traMADol (ULTRAM) 50 MG tablet  Nausea without vomiting - Plan: ondansetron (ZOFRAN) 4 MG tablet  Dyspepsia - Plan: pantoprazole (PROTONIX) 40 MG tablet  Protonix 30 min before breakfast. Tramadol can cause constipation.  Start Miralax at night daily as needed.  If you need refills for medications you take chronically, please call your pharmacy. Do not use My Chart to request refills or for acute issues that need immediate attention. If you send a my chart message, it may take a few days to be addressed, specially if I am not in the office.  Please be sure medication list is accurate. If a new problem present, please set up appointment sooner than planned today.

## 2022-11-23 ENCOUNTER — Ambulatory Visit (INDEPENDENT_AMBULATORY_CARE_PROVIDER_SITE_OTHER)
Admission: RE | Admit: 2022-11-23 | Discharge: 2022-11-23 | Disposition: A | Discharge status: Home / Self Care | Payer: Medicare Other | Source: Ambulatory Visit | Attending: Family Medicine | Admitting: Family Medicine

## 2022-11-23 DIAGNOSIS — R1013 Epigastric pain: Secondary | ICD-10-CM

## 2022-11-23 LAB — CBC
HCT: 38 % (ref 36.0–46.0)
Hemoglobin: 12.7 g/dL (ref 12.0–15.0)
MCHC: 33.5 g/dL (ref 30.0–36.0)
MCV: 82.6 fl (ref 78.0–100.0)
Platelets: 178 10*3/uL (ref 150.0–400.0)
RBC: 4.61 Mil/uL (ref 3.87–5.11)
RDW: 13 % (ref 11.5–15.5)
WBC: 5.7 10*3/uL (ref 4.0–10.5)

## 2022-11-23 LAB — LIPASE: Lipase: 37 U/L (ref 11.0–59.0)

## 2022-11-29 ENCOUNTER — Telehealth (INDEPENDENT_AMBULATORY_CARE_PROVIDER_SITE_OTHER): Payer: Medicare Other | Admitting: Family Medicine

## 2022-11-29 DIAGNOSIS — Z0000 Encounter for general adult medical examination without abnormal findings: Secondary | ICD-10-CM

## 2022-11-29 NOTE — Progress Notes (Signed)
PATIENT CHECK-IN and HEALTH RISK ASSESSMENT QUESTIONNAIRE:  -completed by phone/video for upcoming Medicare Preventive Visit  Pre-Visit Check-in: 1)Vitals (height, wt, BP, etc) - record in vitals section for visit on day of visit 2)Review and Update Medications, Allergies PMH, Surgeries, Social history in Epic 3)Hospitalizations in the last year with date/reason?  No 4)Review and Update Care Team (patient's specialists) in Epic 5) Complete PHQ9 in Epic  6) Complete Fall Screening in Epic 7)Review all Health Maintenance Due and order under PCP if not done.  8)Medicare Wellness Questionnaire: Answer theses question about your habits: Do you drink alcohol? No  If yes, how many drinks do you have a day?No Have you ever smoked?No Quit date if applicable?  N/A How many packs a day do/did you smoke? N/A Do you use smokeless tobacco?N/A Do you use an illicit drugs?No Do you exercises? Yes IF so, what type and how many days/minutes per week?Walking, about 3-4 days per week, 30-45 minutes. She is going to start doing strength training at a recreation center. Are you sexually active? No Number of partners?N/A Typical breakfast: fruit , tea Typical lunch:soup with crackers Typical dinner: soup, fish, stir fry vegetable  Typical snacks: granola bar,fruit, unsalted crackers  Beverages: :various tea, water  Answer theses question about you: Can you perform most household chores?Yes Do you find it hard to follow a conversation in a noisy room?No Do you often ask people to speak up or repeat themselves?No  Do you feel that you have a problem with memory?No  Do you balance your checkbook and or bank acounts?Yes Do you feel safe at home?Yes Last dentist visit?3 weeks ago Do you need assistance with any of the following: Please note if so No assistance needed with below  Driving?  Feeding yourself?  Getting from bed to chair?  Getting to the toilet?  Bathing or showering?  Dressing  yourself?  Managing money?  Climbing a flight of stairs  Preparing meals?  Do you have Advanced Directives in place (Living Will, Healthcare Power or Attorney)? Yes   Last eye Exam and location?1 year ago,Dr. Cristal Ford and [facility]    Do you currently use prescribed or non-prescribed narcotic or opioid pain medications?   previously taking Tramadol as needed  Do you have a history or close family history of breast, ovarian, tubal or peritoneal cancer or a family member with BRCA (breast cancer susceptibility 1 and 2) gene mutations?  Nurse/Assistant Credentials/time stamp:[facility]    ----------------------------------------------------------------------------------------------------------------------------------------------------------------------------------------------------------------------   MEDICARE ANNUAL PREVENTIVE VISIT WITH PROVIDER: (Welcome to Medicare, initial annual wellness or annual wellness exam)  Virtual Visit via Video Note  I connected with Nancy Christensen on 11/29/22 by a video enabled telemedicine application and verified that I am speaking with the correct person using two identifiers.  Location patient: home Location provider:work or home office Persons participating in the virtual visit: patient, provider  Concerns and/or follow up today: none - reports will be seeing her PCP for follow up later this week   See HM section in Epic for other details of completed HM.    ROS: negative for report of fevers, unintentional weight loss, vision changes, vision loss, hearing loss or change, chest pain, sob, hemoptysis, melena, hematochezia, hematuria, genital discharge or lesions, falls, bleeding or bruising, loc, thoughts of suicide or self harm, memory loss  Patient-completed extensive health risk assessment - reviewed and discussed with the patient: See Health Risk Assessment completed with patient prior to the visit either above or in recent phone  note. This  was reviewed in detailed with the patient today and appropriate recommendations, orders and referrals were placed as needed per Summary below and patient instructions.   Review of Medical History: -PMH, PSH, Family History and current specialty and care providers reviewed and updated and listed below   Patient Care Team: Billie Ruddy, MD as PCP - General (Family Medicine)   Past Medical History:  Diagnosis Date   Arthritis    left hip and knee   Atypical chest pain 10/25/2018   Cholecystitis with cholelithiasis 02/11/2018   Chronic right shoulder pain 07/04/2018   Constipation due to outlet dysfunction    Dizziness 10/25/2018   Dyslipidemia 10/25/2018   Essential hypertension 04/28/2018   Changed arb to CCB 04/24/2018 due to cough x one month trial    Hyperlipidemia    Hypertension    Hypertensive disorder 04/18/2018   Multiple pulmonary nodules determined by computed tomography of lung 01/26/2018   Passive smoke exp/ remote  Chest CT  09/24/17 MPN's  Largest 4 x 6 mm R laterally  > rec f/u 09/14/18 (reminder file)   As pt reports neg CT 2015 (not avail)  - ESR 01/25/2018 = 35  - CT chest 07/11/18 =  5 mm > no f/u needed    Primary osteoarthritis of left hip 11/26/2019   Primary osteoarthritis of left knee 03/11/2020   Rectal pain    Sinus bradycardia 10/25/2018   Unilateral primary osteoarthritis, left knee 06/14/2018   Upper airway cough syndrome 01/25/2018   Onset 1980 CT head 12/22/17 neg sinus dz FENO 01/25/2018  =   21 - Allergy profile 01/25/2018 >  Eos 0.1 /  IgE  17 RAST neg  - flare 02/12/18 p et  - gabapentin 100 mg tid 03/23/2018 > increased to max of 300 tid 04/24/2018 > improved so increased to 300 qid 06/05/2018 > d/c'd around 02/2019 s flare     Past Surgical History:  Procedure Laterality Date   ABDOMINAL HYSTERECTOMY  1997   ANAL RECTAL MANOMETRY N/A 08/09/2019   Procedure: ANO RECTAL MANOMETRY;  Surgeon: Thornton Park, MD;  Location: [facility];  Service: Gastroenterology;   Laterality: N/A;   BREAST SURGERY  2002   left cyst removal    CHOLECYSTECTOMY N/A 02/12/2018   Procedure: LAPAROSCOPIC CHOLECYSTECTOMY;  Surgeon: Leighton Ruff, MD;  Location: [facility];  Service: General;  Laterality: N/A;   COLONOSCOPY  07/12/2019   POLYPECTOMY      Social History   Socioeconomic History   Marital status: Divorced    Spouse name: Not on file   Number of children: Not on file   Years of education: Not on file   Highest education level: Not on file  Occupational History   Not on file  Tobacco Use   Smoking status: Never   Smokeless tobacco: Never  Vaping Use   Vaping Use: Never used  Substance and Sexual Activity   Alcohol use: Never   Drug use: Never   Sexual activity: Not Currently    Birth control/protection: Post-menopausal  Other Topics Concern   Not on file  Social History Narrative   Not on file   Social Determinants of Health   Financial Resource Strain: Low Risk  (02/22/2022)   Overall Financial Resource Strain (CARDIA)    Difficulty of Paying Living Expenses: Not hard at all  Food Insecurity: No Food Insecurity (02/22/2022)   Hunger Vital Sign    Worried About Running Out of Food in the Last Year:  Never true    Ran Out of Food in the Last Year: Never true  Transportation Needs: No Transportation Needs (02/22/2022)   PRAPARE - Hydrologist (Medical): No    Lack of Transportation (Non-Medical): No  Physical Activity: Sufficiently Active (02/22/2022)   Exercise Vital Sign    Days of Exercise per Week: 5 days    Minutes of Exercise per Session: 50 min  Stress: No Stress Concern Present (02/22/2022)   [place]    Feeling of Stress : Not at all  Social Connections: Moderately Integrated (02/22/2022)   Social Connection and Isolation Panel [NHANES]    Frequency of Communication with Friends and Family: More than three times a week    Frequency of Social  Gatherings with Friends and Family: Twice a week    Attends Religious Services: More than 4 times per year    Active Member of [employer] or Organizations: Yes    Attends Music therapist: More than 4 times per year    Marital Status: Divorced  Intimate Partner Violence: Not At Risk (11/25/2021)   Humiliation, Afraid, Rape, and Kick questionnaire    Fear of Current or Ex-Partner: No    Emotionally Abused: No    Physically Abused: No    Sexually Abused: No    Family History  Problem Relation Age of Onset   Diabetes Mother    Hypertension Mother    Thyroid disease Mother    Vascular Disease Mother    Lung cancer Father    Colon cancer Maternal Uncle    Colon polyps Neg Hx    Esophageal cancer Neg Hx    Stomach cancer Neg Hx    Rectal cancer Neg Hx    Breast cancer Neg Hx     Current Outpatient Medications on File Prior to Visit  Medication Sig Dispense Refill   Bempedoic Acid (NEXLETOL) 180 MG TABS Take 1 tablet by mouth daily. 96.42 tablet 6   cholecalciferol (VITAMIN D3) 25 MCG (1000 UT) tablet Take 1,000 Units by mouth daily.     irbesartan (AVAPRO) 75 MG tablet TAKE ONE TABLETS BY MOUTH DAILY 45 tablet 3   pantoprazole (PROTONIX) 40 MG tablet Take 1 tablet (40 mg total) by mouth daily. 60 tablet 0   traMADol (ULTRAM) 50 MG tablet Take 1 tablet (50 mg total) by mouth every 12 (twelve) hours as needed for up to 7 days. 14 tablet 0   No current facility-administered medications on file prior to visit.    No Known Allergies     Physical Exam There were no vitals filed for this visit. Estimated body mass index is 33.84 kg/m as calculated from the following:   Height as of 11/22/22: 5' 4.5" (1.638 m).   Weight as of 11/22/22: 200 lb 4 oz (90.8 kg).  EKG (optional): deferred due to virtual visit  GENERAL: alert, oriented, no acute distress detected, full vision exam deferred due to pandemic and/or virtual encounter  HEENT: atraumatic, conjunttiva clear, no obvious  abnormalities on inspection of external nose and ears  NECK: normal movements of the head and neck  LUNGS: on inspection no signs of respiratory distress, breathing rate appears normal, no obvious gross SOB, gasping or wheezing  CV: no obvious cyanosis  MS: moves all visible extremities without noticeable abnormality  PSYCH/NEURO: pleasant and cooperative, no obvious depression or anxiety, speech and thought processing grossly intact, Cognitive function grossly intact  Flowsheet  Row Video Visit from 11/29/2022 in [facility] [place]  PHQ-9 Total Score 0           11/29/2022   10:26 AM 11/07/2022    2:30 PM 10/19/2022    9:31 AM 05/06/2022    4:23 PM 02/24/2022    8:32 AM  Depression screen PHQ 2/9  Decreased Interest 0 0 0 0 0  Down, Depressed, Hopeless 0 0 0 0 0  PHQ - 2 Score 0 0 0 0 0  Altered sleeping 0 1 0 0 1  Tired, decreased energy 0 0 0 0 0  Change in appetite 0 0 0 0 0  Feeling bad or failure about yourself  0 1 0 0 0  Trouble concentrating 0 0 0 0 0  Moving slowly or fidgety/restless 0 0 0 0 0  Suicidal thoughts 0 0 0 0 0  PHQ-9 Score 0 2 0 0 1  Difficult doing work/chores Not difficult at all Not difficult at all   Not difficult at all       02/22/2022    8:44 PM 02/24/2022    8:32 AM 10/19/2022    9:30 AM 11/07/2022    2:29 PM 11/29/2022   10:25 AM  [place] in the past year? 0 0 0 0 0  Was there an injury with Fall?  0 0 0 0  Fall Risk Category Calculator  0 0 0 0  Fall Risk Category (Retired)  Low     (RETIRED) Patient Fall Risk Level  Low fall risk     Patient at Risk for Falls Due to  No Fall Risks No Fall Risks No Fall Risks   Fall risk Follow up  Falls evaluation completed Falls evaluation completed Falls evaluation completed      SUMMARY AND PLAN:  Encounter for Medicare annual wellness exam    Discussed applicable health maintenance/preventive health measures and advised and referred or ordered per patient  preferences: Patient declined all vaccines due, she did not have any questions regarding them. She is considering the hepatitis C screening.  Health Maintenance  Topic Date Due   Hepatitis C Screening  Never done   Pneumonia Vaccine 37+ Years old (1 of 1 - PCV) 11/29/2023 (Originally 05/04/2015)   Zoster Vaccines- Shingrix (1 of 2) 11/29/2023 (Originally 05/03/2000)   COVID-19 Vaccine (1) 12/15/2023 (Originally 11/03/1951)   INFLUENZA VACCINE  12/25/2023 (Originally 04/26/2022)   Medicare Annual Wellness (AWV)  11/29/2023   MAMMOGRAM  08/30/2024   COLONOSCOPY (Pts 45-40yr Insurance coverage will need to be confirmed)  10/26/2027   DTaP/Tdap/Td (2 - Tdap) 05/06/2032   DEXA SCAN  Completed   HPV VACCINES  Aged [employer]Corpand counseling on the following was provided based on the above review of health and a plan/checklist for the patient, along with additional information discussed, was provided for the patient in the patient instructions :  -Advised and counseled on maintaining healthy weight and healthy lifestyle - including the importance of a healthy diet, regular physical activity, social connections and stress management. -Advised and counseled on a whole foods based healthy diet and regular exercise: discussed a heart healthy whole foods based diet at length. A summary of a healthy diet was provided in the Patient Instructions. Recommended regular exercise and discussed exercise guidelines and options within the community. Discussed lifestyle changes for HLD, HTN, renal disease.  -Advise yearly dental visits at minimum and regular eye exams  Follow up: see patient instructions     Patient Instructions  I really enjoyed getting to talk with you today! I am available on Tuesdays and Thursdays for virtual visits if you have any questions or concerns, or if I can be of any further assistance.   CHECKLIST FROM ANNUAL WELLNESS VISIT:  -Follow up (please call to schedule if not  scheduled after visit):  -Inperson visit with your Primary Doctor office: as planned -yearly for annual wellness visit with primary care office  Here is a list of your preventive care/health maintenance measures and the plan for each if any are due:  Health Maintenance  Topic Date Due   Hepatitis C Screening  Never done, may request with labs if desired   Pneumonia Vaccine 62+ Years old (1 of 1 - PCV) 11/29/2023 (Originally 05/04/2015), declined by patient   Zoster Vaccines- Shingrix (1 of 2) 11/29/2023 (Originally 05/03/2000) declined by patient   COVID-19 Vaccine (1) 12/15/2023 (Originally 11/03/1951) declined by patient   INFLUENZA VACCINE  12/25/2023 (Originally 04/26/2022) declined by patient   Medicare Annual Wellness (AWV)  11/29/2023   MAMMOGRAM  08/30/2024   COLONOSCOPY (Pts 45-65yr Insurance coverage will need to be confirmed)  10/26/2027   DTaP/Tdap/Td (2 - Tdap) 05/06/2032   DEXA SCAN  Completed   HPV VACCINES  Aged Out    -See a dentist at least yearly  -Get your eyes checked and then per your eye specialist's recommendations  -Other issues addressed today:  -I have included below further information regarding a healthy whole foods based diet, physical activity guidelines for adults, stress management and opportunities for social connections. I hope you find this information useful.   -----------------------------------------------------------------------------------------------------------------------------------------------------------------------------------------------------------------------------------------------------------  NUTRITION: -eat real food: lots of colorful vegetables (half the plate) and fruits -5-7 servings of vegetables and fruits per day (fresh or steamed is best), exp. 2 servings of vegetables with lunch and dinner and 2 servings of fruit per day. Berries and greens such as kale and collards are great choices.  -consume on a regular basis: whole  grains (make sure first ingredient on label contains the word "whole"), fresh fruits, fish, nuts, seeds, healthy oils (such as olive oil, avocado oil, grape seed oil) -may eat small amounts of dairy and lean meat on occasion, but avoid processed meats such as ham, bacon, lunch meat, etc. -drink water -try to avoid fast food and pre-packaged foods, processed meat -most experts advise limiting sodium to < '2300mg'$  per day, should limit further is any chronic conditions such as high blood pressure, heart disease, diabetes, etc. The American Heart Association advised that < '1500mg'$  is is ideal -try to avoid foods that contain any ingredients with names you do not recognize  -try to avoid sugar/sweets (except for the natural sugar that occurs in fresh fruit) -try to avoid sweet drinks -try to avoid white rice, white bread, pasta (unless whole grain), white or yellow potatoes  EXERCISE GUIDELINES FOR ADULTS: -if you wish to increase your physical activity, do so gradually and with the approval of your doctor -STOP and seek medical care immediately if you have any chest pain, chest discomfort or trouble breathing when starting or increasing exercise  -move and stretch your body, legs, feet and arms when sitting for long periods -Physical activity guidelines for optimal health in adults: -least 150 minutes per week of aerobic exercise (can talk, but not sing) once approved by your doctor, 20-30 minutes of sustained activity or two 10 minute episodes of sustained activity every day.  -  resistance training at least 2 days per week if approved by your doctor -balance exercises 3+ days per week:   Stand somewhere where you have something sturdy to hold onto if you lose balance.    1) lift up on toes, start with 5x per day and work up to 20x   2) stand and lift on leg straight out to the side so that foot is a few inches of the floor, start with 5x each side and work up to 20x each side   3) stand on one foot,  start with 5 seconds each side and work up to 20 seconds on each side  If you need ideas or help with getting more active:  -Silver sneakers https://tools.silversneakers.com  -Walk with a Doc: http://stephens-thompson.biz/  -try to include resistance (weight lifting/strength building) and balance exercises twice per week: or the following link for ideas: ChessContest.fr  UpdateClothing.com.cy  STRESS MANAGEMENT: -can try meditating, or just sitting quietly with deep breathing while intentionally relaxing all parts of your body for 5 minutes daily -if you need further help with stress, anxiety or depression please follow up with your primary doctor or contact the wonderful folks at [place]: [place]: -options in [place] if you wish to engage in more social and exercise related activities:  -Silver sneakers https://tools.silversneakers.com  -Walk with a Doc: http://stephens-thompson.biz/  -Check out the [place] 50+ section on the [place] [employer] (hiking clubs, book clubs, cards and games, chess, exercise classes, aquatic classes and much more) - see the website for details: https://www.[place].gov/departments/parks-recreation/active-adults50  -YouTube has lots of exercise videos for different ages and abilities as well  -[place] (a variety of indoor and outdoor inperson activities for adults). 902-233-3896. [address]  -Virtual Online Classes (a variety of topics): see seniorplanet.org or call [phone]  -consider volunteering at a school, hospice center, church, senior center or elsewhere           Lucretia Kern, DO

## 2022-11-29 NOTE — Patient Instructions (Signed)
I really enjoyed getting to talk with you today! I am available on Tuesdays and Thursdays for virtual visits if you have any questions or concerns, or if I can be of any further assistance.   CHECKLIST FROM ANNUAL WELLNESS VISIT:  -Follow up (please call to schedule if not scheduled after visit):  -Inperson visit with your Primary Doctor office: as planned -yearly for annual wellness visit with primary care office  Here is a list of your preventive care/health maintenance measures and the plan for each if any are due:  Health Maintenance  Topic Date Due   Hepatitis C Screening  Never done, may request with labs if desired   Pneumonia Vaccine 55+ Years old (1 of 1 - PCV) 11/29/2023 (Originally 05/04/2015), declined by patient   Zoster Vaccines- Shingrix (1 of 2) 11/29/2023 (Originally 05/03/2000) declined by patient   COVID-19 Vaccine (1) 12/15/2023 (Originally 11/03/1951) declined by patient   INFLUENZA VACCINE  12/25/2023 (Originally 04/26/2022) declined by patient   Medicare Annual Wellness (AWV)  11/29/2023   MAMMOGRAM  08/30/2024   COLONOSCOPY (Pts 45-78yr Insurance coverage will need to be confirmed)  10/26/2027   DTaP/Tdap/Td (2 - Tdap) 05/06/2032   DEXA SCAN  Completed   HPV VACCINES  Aged Out    -See a dentist at least yearly  -Get your eyes checked and then per your eye specialist's recommendations  -Other issues addressed today:  -I have included below further information regarding a healthy whole foods based diet, physical activity guidelines for adults, stress management and opportunities for social connections. I hope you find this information useful.   -----------------------------------------------------------------------------------------------------------------------------------------------------------------------------------------------------------------------------------------------------------  NUTRITION: -eat real food: lots of colorful vegetables (half the plate)  and fruits -5-7 servings of vegetables and fruits per day (fresh or steamed is best), exp. 2 servings of vegetables with lunch and dinner and 2 servings of fruit per day. Berries and greens such as kale and collards are great choices.  -consume on a regular basis: whole grains (make sure first ingredient on label contains the word "whole"), fresh fruits, fish, nuts, seeds, healthy oils (such as olive oil, avocado oil, grape seed oil) -may eat small amounts of dairy and lean meat on occasion, but avoid processed meats such as ham, bacon, lunch meat, etc. -drink water -try to avoid fast food and pre-packaged foods, processed meat -most experts advise limiting sodium to < '2300mg'$  per day, should limit further is any chronic conditions such as high blood pressure, heart disease, diabetes, etc. The American Heart Association advised that < '1500mg'$  is is ideal -try to avoid foods that contain any ingredients with names you do not recognize  -try to avoid sugar/sweets (except for the natural sugar that occurs in fresh fruit) -try to avoid sweet drinks -try to avoid white rice, white bread, pasta (unless whole grain), white or yellow potatoes  EXERCISE GUIDELINES FOR ADULTS: -if you wish to increase your physical activity, do so gradually and with the approval of your doctor -STOP and seek medical care immediately if you have any chest pain, chest discomfort or trouble breathing when starting or increasing exercise  -move and stretch your body, legs, feet and arms when sitting for long periods -Physical activity guidelines for optimal health in adults: -least 150 minutes per week of aerobic exercise (can talk, but not sing) once approved by your doctor, 20-30 minutes of sustained activity or two 10 minute episodes of sustained activity every day.  -resistance training at least 2 days per week if approved by your  doctor -balance exercises 3+ days per week:   Stand somewhere where you have something sturdy  to hold onto if you lose balance.    1) lift up on toes, start with 5x per day and work up to 20x   2) stand and lift on leg straight out to the side so that foot is a few inches of the floor, start with 5x each side and work up to 20x each side   3) stand on one foot, start with 5 seconds each side and work up to 20 seconds on each side  If you need ideas or help with getting more active:  -Silver sneakers https://tools.silversneakers.com  -Walk with a Doc: http://stephens-thompson.biz/  -try to include resistance (weight lifting/strength building) and balance exercises twice per week: or the following link for ideas: ChessContest.fr  UpdateClothing.com.cy  STRESS MANAGEMENT: -can try meditating, or just sitting quietly with deep breathing while intentionally relaxing all parts of your body for 5 minutes daily -if you need further help with stress, anxiety or depression please follow up with your primary doctor or contact the wonderful folks at [employer]: Daniels: -options in [place] if you wish to engage in more social and exercise related activities:  -Silver sneakers https://tools.silversneakers.com  -Walk with a Doc: http://stephens-thompson.biz/  -Check out the [place] 50+ section on the [place] [employer] (hiking clubs, book clubs, cards and games, chess, exercise classes, aquatic classes and much more) - see the website for details: https://www.[place].gov/departments/parks-recreation/active-adults50  -YouTube has lots of exercise videos for different ages and abilities as well  -[place] (a variety of indoor and outdoor inperson activities for adults). (832) 653-1914. [address]  -Virtual Online Classes (a variety of topics): see seniorplanet.org or call 9701409163  -consider  volunteering at a school, hospice center, church, senior center or elsewhere

## 2022-12-02 ENCOUNTER — Encounter: Payer: Self-pay | Admitting: Family Medicine

## 2022-12-02 ENCOUNTER — Ambulatory Visit (INDEPENDENT_AMBULATORY_CARE_PROVIDER_SITE_OTHER): Payer: Medicare Other | Admitting: Family Medicine

## 2022-12-02 VITALS — BP 170/86 | HR 59 | Temp 97.9°F | Ht 64.5 in | Wt 205.0 lb

## 2022-12-02 DIAGNOSIS — I10 Essential (primary) hypertension: Secondary | ICD-10-CM | POA: Diagnosis not present

## 2022-12-02 DIAGNOSIS — R1013 Epigastric pain: Secondary | ICD-10-CM | POA: Diagnosis not present

## 2022-12-02 NOTE — Progress Notes (Signed)
   Established Patient Office Visit   Subjective  Patient ID: ALLETTA CALIXTO, female    DOB: Aug 01, 1951  Age: 72 y.o. MRN: [id number]  Chief Complaint  Patient presents with   Follow-up    On abdominal pain. Pt reports she doing better. Pt states " it is very slight now". Occasional.     Patient is a 72 year old female with pmh sig for HTN, HLD, osteoarthritis who was seen for acute concern.  Patient initially scheduled appointment due to intermittent epigastric abdominal pain.  Symptoms started after taking Nexletol consistently in January.  Patient stopped medication this week and is feeling better.  Patient states she felt weak, lightheaded.  Started eating bread, crackers as she felt like she did when she had an ulcer.  Symptoms improved after being on Protonix x 2 weeks.  Still having mild tenderness in abdomen.  BP at home 137/78.      ROS Negative unless stated above    Objective:     BP (!) 170/86 (BP Location: Right Arm, Cuff Size: Large)   Pulse (!) 59   Temp 97.9 F (36.6 C) (Oral)   Ht 5' 4.5" (1.638 m)   Wt 205 lb (93 kg)   SpO2 100%   BMI 34.64 kg/m    Physical Exam Constitutional:      General: She is not in acute distress.    Appearance: Normal appearance.  HENT:     Head: Normocephalic and atraumatic.     Nose: Nose normal.     Mouth/Throat:     Mouth: Mucous membranes are moist.  Cardiovascular:     Rate and Rhythm: Normal rate and regular rhythm.     Heart sounds: Normal heart sounds. No murmur heard.    No gallop.  Pulmonary:     Effort: Pulmonary effort is normal. No respiratory distress.     Breath sounds: Normal breath sounds. No wheezing, rhonchi or rales.  Abdominal:     General: Bowel sounds are normal.     Palpations: Abdomen is soft.     Tenderness: There is abdominal tenderness in the epigastric area. There is no guarding or rebound. Negative signs include Murphy's sign.     Comments: Mild epigastric tenderness.  Skin:    General:  Skin is warm and dry.  Neurological:     Mental Status: She is alert and oriented to person, place, and time.      No results found for any visits on 12/02/22.    Assessment & Plan:  Epigastric abdominal pain  Essential hypertension   Patient advised to continue holding Nexletol.  Advised to schedule follow-up with GI.  Continue bland diet, advance as tolerated.  Continue irbesartan 75 mg daily.  BP likely elevated 2/2 pain and dehydration.  Continue to monitor BP closely.  Given strict precautions.  No follow-ups on file.   Deeann Saint, MD

## 2022-12-07 DIAGNOSIS — I83892 Varicose veins of left lower extremities with other complications: Secondary | ICD-10-CM | POA: Diagnosis not present

## 2022-12-07 DIAGNOSIS — I87392 Chronic venous hypertension (idiopathic) with other complications of left lower extremity: Secondary | ICD-10-CM | POA: Diagnosis not present

## 2022-12-21 DIAGNOSIS — I87392 Chronic venous hypertension (idiopathic) with other complications of left lower extremity: Secondary | ICD-10-CM | POA: Diagnosis not present

## 2022-12-21 DIAGNOSIS — I83892 Varicose veins of left lower extremities with other complications: Secondary | ICD-10-CM | POA: Diagnosis not present

## 2023-01-16 ENCOUNTER — Ambulatory Visit (INDEPENDENT_AMBULATORY_CARE_PROVIDER_SITE_OTHER): Payer: Medicare Other | Admitting: Family Medicine

## 2023-01-16 ENCOUNTER — Encounter: Payer: Self-pay | Admitting: Family Medicine

## 2023-01-16 VITALS — BP 138/74 | HR 51 | Temp 98.1°F | Ht 64.5 in | Wt 210.0 lb

## 2023-01-16 DIAGNOSIS — I10 Essential (primary) hypertension: Secondary | ICD-10-CM

## 2023-01-16 DIAGNOSIS — S8012XS Contusion of left lower leg, sequela: Secondary | ICD-10-CM | POA: Diagnosis not present

## 2023-01-16 NOTE — Progress Notes (Signed)
Established Patient Office Visit   Subjective  Patient ID: Nancy Christensen, female    DOB: 1951-08-04  Age: 72 y.o. MRN: [id number]  Chief Complaint  Patient presents with   Medical Management of Chronic Issues    Follow up on BP.     Pt is a 72 yo female with pmh sig for HTN, HLD, OA was seen for follow-up on blood pressure.  Patient states she has been doing well for the most part.  BP at home this morning 129/76.  Pt gave blood the other day and bp was normal.  Taking irbesartan 75 mg daily.  Patient notes occasional soreness and edema in left shin with prolonged standing.  Pt developed symptoms after missing a step when trying to get out of a pool while out of town, October 2023.  Patient seen by urgent care for injury, told to ice the contusion.    Patient Active Problem List   Diagnosis Date Noted   Puncture wound of toe of left foot 05/06/2022   Hypertension    Hyperlipidemia    Arthritis    Primary osteoarthritis of left knee 03/11/2020   Primary osteoarthritis of left hip 11/26/2019   Constipation due to outlet dysfunction    Rectal pain    Dizziness 10/25/2018   Sinus bradycardia 10/25/2018   Atypical chest pain 10/25/2018   Dyslipidemia 10/25/2018   Chronic right shoulder pain 07/04/2018   Unilateral primary osteoarthritis, left knee 06/14/2018   Essential hypertension 04/28/2018   Hypertensive disorder 04/18/2018   Cholecystitis with cholelithiasis 02/11/2018   Multiple pulmonary nodules determined by computed tomography of lung 01/26/2018   Upper airway cough syndrome 01/25/2018   Past Surgical History:  Procedure Laterality Date   ABDOMINAL HYSTERECTOMY  1997   ANAL RECTAL MANOMETRY N/A 08/09/2019   Procedure: ANO RECTAL MANOMETRY;  Surgeon: Tressia Danas, MD;  Location: [facility];  Service: Gastroenterology;  Laterality: N/A;   BREAST SURGERY  2002   left cyst removal    CHOLECYSTECTOMY N/A 02/12/2018   Procedure: LAPAROSCOPIC CHOLECYSTECTOMY;   Surgeon: Romie Levee, MD;  Location: [facility];  Service: General;  Laterality: N/A;   COLONOSCOPY  07/12/2019   POLYPECTOMY     Social History   Tobacco Use   Smoking status: Never   Smokeless tobacco: Never  Vaping Use   Vaping Use: Never used  Substance Use Topics   Alcohol use: Never   Drug use: Never   Family History  Problem Relation Age of Onset   Diabetes Mother    Hypertension Mother    Thyroid disease Mother    Vascular Disease Mother    Lung cancer Father    Colon cancer Maternal Uncle    Colon polyps Neg Hx    Esophageal cancer Neg Hx    Stomach cancer Neg Hx    Rectal cancer Neg Hx    Breast cancer Neg Hx    No Known Allergies    ROS Negative unless stated above    Objective:     BP 138/74 (BP Location: Left Arm, Patient Position: Sitting, Cuff Size: Large)   Pulse (!) 51   Temp 98.1 F (36.7 C) (Oral)   Ht 5' 4.5" (1.638 m)   Wt 210 lb (95.3 kg)   SpO2 99%   BMI 35.49 kg/m  BP Readings from Last 3 Encounters:  01/16/23 138/74  12/02/22 (!) 170/86  11/22/22 132/80   Wt Readings from Last 3 Encounters:  01/16/23 210 lb (95.3 kg)  12/02/22 205 lb (93 kg)  11/22/22 200 lb 4 oz (90.8 kg)      Physical Exam Constitutional:      General: She is not in acute distress.    Appearance: Normal appearance.  HENT:     Head: Normocephalic and atraumatic.     Nose: Nose normal.     Mouth/Throat:     Mouth: Mucous membranes are moist.  Cardiovascular:     Rate and Rhythm: Normal rate and regular rhythm.     Heart sounds: Normal heart sounds. No murmur heard.    No gallop.  Pulmonary:     Effort: Pulmonary effort is normal. No respiratory distress.     Breath sounds: Normal breath sounds. No wheezing, rhonchi or rales.  Skin:    General: Skin is warm and dry.  Neurological:     Mental Status: She is alert and oriented to person, place, and time.     No results found for any visits on 01/16/23.    Assessment & Plan:  Essential  hypertension -Well-controlled at home -Elevated initially at office visits likely 2/2 component of whitecoat syndrome/anxiety -Continue irbesartan 75 mg daily -Lifestyle modifications -For elevations consistently greater than 140/90 at home increase medication.  Contusion of left lower leg, sequela -supportive care including elevation, TED hose, ice as needed   Return in about 4 months (around 05/18/2023).   Deeann Saint, MD

## 2023-01-18 DIAGNOSIS — I10 Essential (primary) hypertension: Secondary | ICD-10-CM | POA: Diagnosis not present

## 2023-01-18 DIAGNOSIS — I872 Venous insufficiency (chronic) (peripheral): Secondary | ICD-10-CM | POA: Diagnosis not present

## 2023-01-18 DIAGNOSIS — I87392 Chronic venous hypertension (idiopathic) with other complications of left lower extremity: Secondary | ICD-10-CM | POA: Diagnosis not present

## 2023-01-18 DIAGNOSIS — R600 Localized edema: Secondary | ICD-10-CM | POA: Diagnosis not present

## 2023-03-01 DIAGNOSIS — M25561 Pain in right knee: Secondary | ICD-10-CM | POA: Diagnosis not present

## 2023-03-01 DIAGNOSIS — R262 Difficulty in walking, not elsewhere classified: Secondary | ICD-10-CM | POA: Diagnosis not present

## 2023-03-01 DIAGNOSIS — M170 Bilateral primary osteoarthritis of knee: Secondary | ICD-10-CM | POA: Diagnosis not present

## 2023-03-01 DIAGNOSIS — M25562 Pain in left knee: Secondary | ICD-10-CM | POA: Diagnosis not present

## 2023-03-15 DIAGNOSIS — M25562 Pain in left knee: Secondary | ICD-10-CM | POA: Diagnosis not present

## 2023-03-15 DIAGNOSIS — R262 Difficulty in walking, not elsewhere classified: Secondary | ICD-10-CM | POA: Diagnosis not present

## 2023-03-15 DIAGNOSIS — M25561 Pain in right knee: Secondary | ICD-10-CM | POA: Diagnosis not present

## 2023-03-15 DIAGNOSIS — M170 Bilateral primary osteoarthritis of knee: Secondary | ICD-10-CM | POA: Diagnosis not present

## 2023-03-22 DIAGNOSIS — M170 Bilateral primary osteoarthritis of knee: Secondary | ICD-10-CM | POA: Diagnosis not present

## 2023-03-22 DIAGNOSIS — M25562 Pain in left knee: Secondary | ICD-10-CM | POA: Diagnosis not present

## 2023-03-22 DIAGNOSIS — M25561 Pain in right knee: Secondary | ICD-10-CM | POA: Diagnosis not present

## 2023-03-22 DIAGNOSIS — R262 Difficulty in walking, not elsewhere classified: Secondary | ICD-10-CM | POA: Diagnosis not present

## 2023-03-29 DIAGNOSIS — M170 Bilateral primary osteoarthritis of knee: Secondary | ICD-10-CM | POA: Diagnosis not present

## 2023-03-29 DIAGNOSIS — M25562 Pain in left knee: Secondary | ICD-10-CM | POA: Diagnosis not present

## 2023-03-29 DIAGNOSIS — R262 Difficulty in walking, not elsewhere classified: Secondary | ICD-10-CM | POA: Diagnosis not present

## 2023-03-29 DIAGNOSIS — M25561 Pain in right knee: Secondary | ICD-10-CM | POA: Diagnosis not present

## 2023-04-05 DIAGNOSIS — M170 Bilateral primary osteoarthritis of knee: Secondary | ICD-10-CM | POA: Diagnosis not present

## 2023-04-05 DIAGNOSIS — R262 Difficulty in walking, not elsewhere classified: Secondary | ICD-10-CM | POA: Diagnosis not present

## 2023-04-05 DIAGNOSIS — M25561 Pain in right knee: Secondary | ICD-10-CM | POA: Diagnosis not present

## 2023-04-05 DIAGNOSIS — M25562 Pain in left knee: Secondary | ICD-10-CM | POA: Diagnosis not present

## 2023-04-28 ENCOUNTER — Other Ambulatory Visit: Payer: Self-pay | Admitting: Family Medicine

## 2023-04-28 DIAGNOSIS — I10 Essential (primary) hypertension: Secondary | ICD-10-CM

## 2023-06-21 ENCOUNTER — Ambulatory Visit (INDEPENDENT_AMBULATORY_CARE_PROVIDER_SITE_OTHER): Payer: Medicare Other | Admitting: Family Medicine

## 2023-06-21 ENCOUNTER — Encounter: Payer: Self-pay | Admitting: Family Medicine

## 2023-06-21 VITALS — BP 140/90 | HR 64 | Temp 98.4°F | Ht 64.5 in | Wt 217.6 lb

## 2023-06-21 DIAGNOSIS — I10 Essential (primary) hypertension: Secondary | ICD-10-CM

## 2023-06-21 DIAGNOSIS — D649 Anemia, unspecified: Secondary | ICD-10-CM | POA: Diagnosis not present

## 2023-06-21 DIAGNOSIS — Z6836 Body mass index (BMI) 36.0-36.9, adult: Secondary | ICD-10-CM

## 2023-06-22 ENCOUNTER — Other Ambulatory Visit (INDEPENDENT_AMBULATORY_CARE_PROVIDER_SITE_OTHER): Payer: Medicare Other

## 2023-06-22 DIAGNOSIS — Z6836 Body mass index (BMI) 36.0-36.9, adult: Secondary | ICD-10-CM

## 2023-06-22 DIAGNOSIS — D649 Anemia, unspecified: Secondary | ICD-10-CM

## 2023-06-22 DIAGNOSIS — I10 Essential (primary) hypertension: Secondary | ICD-10-CM

## 2023-06-22 LAB — CBC WITH DIFFERENTIAL/PLATELET
Basophils Absolute: 0 10*3/uL (ref 0.0–0.1)
Basophils Relative: 0.7 % (ref 0.0–3.0)
Eosinophils Absolute: 0.1 10*3/uL (ref 0.0–0.7)
Eosinophils Relative: 1.2 % (ref 0.0–5.0)
HCT: 41.2 % (ref 36.0–46.0)
Hemoglobin: 13.3 g/dL (ref 12.0–15.0)
Lymphocytes Relative: 41.2 % (ref 12.0–46.0)
Lymphs Abs: 2.7 10*3/uL (ref 0.7–4.0)
MCHC: 32.3 g/dL (ref 30.0–36.0)
MCV: 84.7 fl (ref 78.0–100.0)
Monocytes Absolute: 0.3 10*3/uL (ref 0.1–1.0)
Monocytes Relative: 4.8 % (ref 3.0–12.0)
Neutro Abs: 3.4 10*3/uL (ref 1.4–7.7)
Neutrophils Relative %: 52.1 % (ref 43.0–77.0)
Platelets: 203 10*3/uL (ref 150.0–400.0)
RBC: 4.86 Mil/uL (ref 3.87–5.11)
RDW: 16.1 % — ABNORMAL HIGH (ref 11.5–15.5)
WBC: 6.6 10*3/uL (ref 4.0–10.5)

## 2023-06-22 LAB — COMPREHENSIVE METABOLIC PANEL
ALT: 10 U/L (ref 0–35)
AST: 15 U/L (ref 0–37)
Albumin: 3.9 g/dL (ref 3.5–5.2)
Alkaline Phosphatase: 77 U/L (ref 39–117)
BUN: 21 mg/dL (ref 6–23)
CO2: 27 mEq/L (ref 19–32)
Calcium: 9.6 mg/dL (ref 8.4–10.5)
Chloride: 106 mEq/L (ref 96–112)
Creatinine, Ser: 1.26 mg/dL — ABNORMAL HIGH (ref 0.40–1.20)
GFR: 42.46 mL/min — ABNORMAL LOW (ref >60.00)
Glucose, Bld: 108 mg/dL — ABNORMAL HIGH (ref 70–99)
Potassium: 3.8 mEq/L (ref 3.5–5.1)
Sodium: 141 mEq/L (ref 135–145)
Total Bilirubin: 0.4 mg/dL (ref 0.2–1.2)
Total Protein: 7.3 g/dL (ref 6.0–8.3)

## 2023-06-22 LAB — TSH: TSH: 1.59 u[IU]/mL (ref 0.35–5.50)

## 2023-06-22 LAB — VITAMIN D 25 HYDROXY (VIT D DEFICIENCY, FRACTURES): VITD: 19.03 ng/mL — ABNORMAL LOW (ref 30.00–100.00)

## 2023-06-22 LAB — VITAMIN B12: Vitamin B-12: 175 pg/mL — ABNORMAL LOW (ref 211–911)

## 2023-06-23 ENCOUNTER — Other Ambulatory Visit: Payer: Medicare Other

## 2023-06-23 LAB — IRON,TIBC AND FERRITIN PANEL
%SAT: 20 % (ref 16–45)
Ferritin: 14 ng/mL — ABNORMAL LOW (ref 16–288)
Iron: 77 ug/dL (ref 45–160)
TIBC: 376 ug/dL (ref 250–450)

## 2023-06-27 ENCOUNTER — Other Ambulatory Visit: Payer: Self-pay | Admitting: Family Medicine

## 2023-06-27 ENCOUNTER — Encounter: Payer: Self-pay | Admitting: Family Medicine

## 2023-06-27 DIAGNOSIS — E559 Vitamin D deficiency, unspecified: Secondary | ICD-10-CM | POA: Insufficient documentation

## 2023-06-27 MED ORDER — VITAMIN D (ERGOCALCIFEROL) 1.25 MG (50000 UNIT) PO CAPS: 50000.0000 [IU] | ORAL_CAPSULE | ORAL | 0 refills | Status: DC

## 2023-06-29 ENCOUNTER — Ambulatory Visit: Payer: Medicare Other

## 2023-06-29 DIAGNOSIS — E538 Deficiency of other specified B group vitamins: Secondary | ICD-10-CM

## 2023-06-29 MED ORDER — CYANOCOBALAMIN 1000 MCG/ML IJ SOLN
1000.0000 ug | Freq: Once | INTRAMUSCULAR | Status: AC
  Administered 2023-06-29: 1000 ug via INTRAMUSCULAR

## 2023-06-29 NOTE — Progress Notes (Signed)
Per orders of Dr. Salomon Fick, injection of B12 given by Vickii Chafe on Left Deltoid. Patient tolerated injection well.

## 2023-07-18 ENCOUNTER — Ambulatory Visit: Payer: Medicare Other | Attending: Cardiology | Admitting: Cardiology

## 2023-07-18 ENCOUNTER — Encounter: Payer: Self-pay | Admitting: Cardiology

## 2023-07-18 VITALS — BP 130/80 | HR 53 | Ht 65.0 in | Wt 217.8 lb

## 2023-07-18 DIAGNOSIS — I10 Essential (primary) hypertension: Secondary | ICD-10-CM | POA: Diagnosis not present

## 2023-07-18 DIAGNOSIS — E782 Mixed hyperlipidemia: Secondary | ICD-10-CM | POA: Diagnosis not present

## 2023-07-18 DIAGNOSIS — R001 Bradycardia, unspecified: Secondary | ICD-10-CM | POA: Insufficient documentation

## 2023-07-18 DIAGNOSIS — R0789 Other chest pain: Secondary | ICD-10-CM | POA: Diagnosis not present

## 2023-07-18 DIAGNOSIS — R072 Precordial pain: Secondary | ICD-10-CM | POA: Insufficient documentation

## 2023-07-18 NOTE — Patient Instructions (Addendum)
Medication Instructions:  Your physician recommends that you continue on your current medications as directed. Please refer to the Current Medication list given to you today.  *If you need a refill on your cardiac medications before your next appointment, please call your pharmacy*   Lab Work: [facility]- 06-22-23 If you have labs (blood work) drawn today and your tests are completely normal, you will receive your results only by: MyChart Message (if you have MyChart) OR A paper copy in the mail If you have any lab test that is abnormal or we need to change your treatment, we will call you to review the results.   Testing/Procedures:  Your cardiac CT will be scheduled at one of the below locations:   [facility] [address] [place], [place] [address] [phone]    If scheduled at [facility], please arrive at the [facility] and Children's Entrance (Entrance C2) of [facility] 30 minutes prior to test start time. You can use the FREE valet parking offered at entrance C (encouraged to control the heart rate for the test)  Proceed to the [facility] Radiology Department (first floor) to check-in and test prep.  All radiology patients and guests should use entrance C2 at [facility], accessed from [facility], even though the hospital's physical address listed is [address]      Please follow these instructions carefully (unless otherwise directed):    On the Night Before the Test: Be sure to Drink plenty of water. Do not consume any caffeinated/decaffeinated beverages or chocolate 12 hours prior to your test. Do not take any antihistamines 12 hours prior to your test.   On the Day of the Test: Drink plenty of water until 1 hour prior to the test. Do not eat any food 4 hours prior to the test. You may take your regular medications prior to the test.  FEMALES- please wear underwire-free bra if available, avoid dresses  & tight clothing       After the Test: Drink plenty of water. After receiving IV contrast, you may experience a mild flushed feeling. This is normal. On occasion, you may experience a mild rash up to 24 hours after the test. This is not dangerous. If this occurs, you can take Benadryl 25 mg and increase your fluid intake. If you experience trouble breathing, this can be serious. If it is severe call 911 IMMEDIATELY. If it is mild, please call our office. If you take any of these medications: Glipizide/Metformin, Avandament, Glucavance, please do not take 48 hours after completing test unless otherwise instructed.  We will call to schedule your test 2-4 weeks out understanding that some insurance companies will need an authorization prior to the service being performed.   For non-scheduling related questions, please contact the cardiac imaging nurse navigator should you have any questions/concerns: Nancy Christensen, Cardiac Imaging Nurse Navigator Nancy Christensen, Cardiac Imaging Nurse Navigator [facility] Direct Office Dial: [phone]   For scheduling needs, including cancellations and rescheduling, please call Nancy Christensen, [phone].    Follow-Up: At [facility], you and your health needs are our priority.  As part of our continuing mission to provide you with exceptional heart care, we have created designated Provider Care Teams.  These Care Teams include your primary Cardiologist (physician) and Advanced Practice Providers (APPs -  Physician Assistants and Nurse Practitioners) who all work together to provide you with the care you need, when you need it.  We recommend signing up for the patient portal called "MyChart".  Sign up information is provided on this After Visit Summary.  MyChart is used to connect with patients for Virtual Visits (Telemedicine).  Patients are able to view lab/test results, encounter notes, upcoming appointments, etc.  Non-urgent  messages can be sent to your provider as well.   To learn more about what you can do with MyChart, go to [email or web address].    Your next appointment:   12 month(s)  The format for your next appointment:   In Person  Provider:   Gypsy Balsam, MD    Other Instructions NA

## 2023-07-18 NOTE — Progress Notes (Unsigned)
Cardiology Office Note:    Date:  07/18/2023   ID:  LO PAAP, DOB July 28, 1951, MRN [id number]  PCP:  Deeann Saint, MD  Cardiologist:  Gypsy Balsam, MD    Referring MD: Deeann Saint, MD   Chief Complaint  Patient presents with   Chest Pain    History of Present Illness:    Nancy Christensen is a 72 y.o. female with past medical history of sinus bradycardia, essential hypertension, dyslipidemia, intolerance to multiple statins I did try Nexletol she also was not able to tolerate this.  Comes today to months for follow-up.  Overall she is doing well but she does complain of having some chest pain.  Pain is not related to exercise partially reproducible by pressing chest wall sometimes happening when she walks.  Past Medical History:  Diagnosis Date   Arthritis    left hip and knee   Atypical chest pain 10/25/2018   Cholecystitis with cholelithiasis 02/11/2018   Chronic right shoulder pain 07/04/2018   Constipation due to outlet dysfunction    Dizziness 10/25/2018   Dyslipidemia 10/25/2018   Essential hypertension 04/28/2018   Changed arb to CCB 04/24/2018 due to cough x one month trial    Hyperlipidemia    Hypertension    Hypertensive disorder 04/18/2018   Multiple pulmonary nodules determined by computed tomography of lung 01/26/2018   Passive smoke exp/ remote  Chest CT  09/24/17 MPN's  Largest 4 x 6 mm R laterally  > rec f/u 09/14/18 (reminder file)   As pt reports neg CT 2015 (not avail)  - ESR 01/25/2018 = 35  - CT chest 07/11/18 =  5 mm > no f/u needed    Primary osteoarthritis of left hip 11/26/2019   Primary osteoarthritis of left knee 03/11/2020   Rectal pain    Sinus bradycardia 10/25/2018   Unilateral primary osteoarthritis, left knee 06/14/2018   Upper airway cough syndrome 01/25/2018   Onset 1980 CT head 12/22/17 neg sinus dz FENO 01/25/2018  =   21 - Allergy profile 01/25/2018 >  Eos 0.1 /  IgE  17 RAST neg  - flare 02/12/18 p et  - gabapentin 100 mg tid 03/23/2018 >  increased to max of 300 tid 04/24/2018 > improved so increased to 300 qid 06/05/2018 > d/c'd around 02/2019 s flare     Past Surgical History:  Procedure Laterality Date   ABDOMINAL HYSTERECTOMY  1997   ANAL RECTAL MANOMETRY N/A 08/09/2019   Procedure: ANO RECTAL MANOMETRY;  Surgeon: Tressia Danas, MD;  Location: [facility];  Service: Gastroenterology;  Laterality: N/A;   BREAST SURGERY  2002   left cyst removal    CHOLECYSTECTOMY N/A 02/12/2018   Procedure: LAPAROSCOPIC CHOLECYSTECTOMY;  Surgeon: Romie Levee, MD;  Location: [facility];  Service: General;  Laterality: N/A;   COLONOSCOPY  07/12/2019   POLYPECTOMY      Current Medications: Current Meds  Medication Sig   irbesartan (AVAPRO) 75 MG tablet TAKE ONE TABLETS BY MOUTH DAILY (Patient taking differently: Take 75 mg by mouth daily. TAKE ONE TABLETS BY MOUTH DAILY)   VITAMIN B1-B12 IJ Inject 1 Dose as directed every 30 (thirty) days.   Vitamin D, Ergocalciferol, (DRISDOL) 1.25 MG (50000 UNIT) CAPS capsule Take 1 capsule (50,000 Units total) by mouth every 7 (seven) days.   [DISCONTINUED] cholecalciferol (VITAMIN D3) 25 MCG (1000 UT) tablet Take 1,000 Units by mouth daily.   [DISCONTINUED] ketorolac (TORADOL) 10 MG tablet Take 10 mg by mouth daily.   [  DISCONTINUED] Sorbitol Candy [place] (NEXTOL SF) MISC 1 each by Other route See admin instructions.     Allergies:   Patient has no known allergies.   Social History   Socioeconomic History   Marital status: Divorced    Spouse name: Not on file   Number of children: Not on file   Years of education: Not on file   Highest education level: Bachelor's degree (e.g., BA, AB, BS)  Occupational History   Not on file  Tobacco Use   Smoking status: Never   Smokeless tobacco: Never  Vaping Use   Vaping status: Never Used  Substance and Sexual Activity   Alcohol use: Never   Drug use: Never   Sexual activity: Not Currently    Birth control/protection: Post-menopausal  Other  Topics Concern   Not on file  Social History Narrative   Not on file   Social Determinants of Health   Financial Resource Strain: Low Risk  (06/20/2023)   Overall Financial Resource Strain (CARDIA)    Difficulty of Paying Living Expenses: Not hard at all  Food Insecurity: No Food Insecurity (06/20/2023)   Hunger Vital Sign    Worried About Running Out of Food in the Last Year: Never true    Ran Out of Food in the Last Year: Never true  Transportation Needs: No Transportation Needs (06/20/2023)   PRAPARE - Administrator, Civil Service (Medical): No    Lack of Transportation (Non-Medical): No  Physical Activity: Insufficiently Active (06/20/2023)   Exercise Vital Sign    Days of Exercise per Week: 2 days    Minutes of Exercise per Session: 40 min  Stress: No Stress Concern Present (06/20/2023)   [employer] of Occupational Health - Occupational Stress Questionnaire    Feeling of Stress : Only a little  Social Connections: Moderately Integrated (06/20/2023)   Social Connection and Isolation Panel [NHANES]    Frequency of Communication with Friends and Family: More than three times a week    Frequency of Social Gatherings with Friends and Family: Twice a week    Attends Religious Services: More than 4 times per year    Active Member of [employer] or Organizations: Yes    Attends Engineer, structural: More than 4 times per year    Marital Status: Divorced     Family History: The patient's family history includes Colon cancer in her maternal uncle; Diabetes in her mother; Hypertension in her mother; Lung cancer in her father; Thyroid disease in her mother; Vascular Disease in her mother. There is no history of Colon polyps, Esophageal cancer, Stomach cancer, Rectal cancer, or Breast cancer. ROS:   Please see the history of present illness.    All 14 point review of systems negative except as described per history of present illness  EKGs/Labs/Other Studies Reviewed:     EKG Interpretation Date/Time:  Tuesday July 18 2023 13:17:16 EDT Ventricular Rate:  53 PR Interval:  182 QRS Duration:  72 QT Interval:  438 QTC Calculation: 410 R Axis:   -3  Text Interpretation: Sinus bradycardia Otherwise normal ECG When compared with ECG of 22-Dec-2017 12:14, PREVIOUS ECG IS PRESENT Confirmed by Gypsy Balsam [phone]) on 07/18/2023 1:38:35 PM    Recent Labs: 06/22/2023: ALT 10; BUN 21; Creatinine, Ser 1.26; Hemoglobin 13.3; Platelets 203.0; Potassium 3.8; Sodium 141; TSH 1.59  Recent Lipid Panel    Component Value Date/Time   CHOL 267 (H) 03/02/2021 1024   TRIG 95 03/02/2021 1024  HDL 52 03/02/2021 1024   CHOLHDL 5.1 (H) 03/02/2021 1024   CHOLHDL 3.9 04/03/2020 1115   VLDL 26.6 06/19/2019 0849   LDLCALC 199 (H) 03/02/2021 1024   LDLCALC 140 (H) 04/03/2020 1115   LDLDIRECT 155 (H) 12/14/2021 1643    Physical Exam:    VS:  BP 130/80 (BP Location: Left Arm, Patient Position: Sitting)   Pulse (!) 53   Ht 5\' 5"  (1.651 m)   Wt 217 lb 12.8 oz (98.8 kg)   SpO2 98%   BMI 36.24 kg/m     Wt Readings from Last 3 Encounters:  07/18/23 217 lb 12.8 oz (98.8 kg)  06/21/23 217 lb 9.6 oz (98.7 kg)  01/16/23 210 lb (95.3 kg)     GEN:  Well nourished, well developed in no acute distress HEENT: Normal NECK: No JVD; No carotid bruits LYMPHATICS: No lymphadenopathy CARDIAC: RRR, no murmurs, no rubs, no gallops RESPIRATORY:  Clear to auscultation without rales, wheezing or rhonchi  ABDOMEN: Soft, non-tender, non-distended MUSCULOSKELETAL:  No edema; No deformity  SKIN: Warm and dry LOWER EXTREMITIES: no swelling NEUROLOGIC:  Alert and oriented x 3 PSYCHIATRIC:  Normal affect   ASSESSMENT:    1. Essential hypertension   2. Sinus bradycardia   3. Atypical chest pain   4. Mixed hyperlipidemia    PLAN:    In order of problems listed above:  Atypical chest pain.  We had a long discussion what to do with the situation I think that be beneficial  for her to coronary CT angio she is very happy when I propose this test since she lost some family member recently for and she was told that that place person have a plaque that broke and kill him.  Overall I have lower suspicion for obstructive disease but I think it would be good idea to know if she gets significant coronary artery disease. Since bradycardia asymptomatic, no dizziness no passing out. Mixed dyslipidemia I did review K PN which show me still very elevated LDL.  We initiated again conversation about taking some medication she prefers to wait after the test will be done for coronary CT to decide about treatment   Medication Adjustments/Labs and Tests Ordered: Current medicines are reviewed at length with the patient today.  Concerns regarding medicines are outlined above.  Orders Placed This Encounter  Procedures   EKG 12-Lead   Medication changes: No orders of the defined types were placed in this encounter.   Signed, Georgeanna Lea, MD, [facility] 07/18/2023 1:54 PM    [facility]

## 2023-07-28 ENCOUNTER — Ambulatory Visit (INDEPENDENT_AMBULATORY_CARE_PROVIDER_SITE_OTHER): Payer: Medicare Other | Admitting: *Deleted

## 2023-07-28 DIAGNOSIS — E538 Deficiency of other specified B group vitamins: Secondary | ICD-10-CM | POA: Diagnosis not present

## 2023-07-28 MED ORDER — CYANOCOBALAMIN 1000 MCG/ML IJ SOLN
1000.0000 ug | Freq: Once | INTRAMUSCULAR | Status: AC
  Administered 2023-07-28: 1000 ug via INTRAMUSCULAR

## 2023-07-28 NOTE — Progress Notes (Signed)
Per orders of Dr. Banks, injection of Cyanocobalamin 1000mcg given by Funderburk, Jo A. °Patient tolerated injection well. ° °

## 2023-08-03 ENCOUNTER — Encounter (HOSPITAL_COMMUNITY): Payer: Self-pay

## 2023-08-07 ENCOUNTER — Telehealth (HOSPITAL_COMMUNITY): Payer: Self-pay | Admitting: *Deleted

## 2023-08-07 NOTE — Telephone Encounter (Signed)
Reaching out to patient to offer assistance regarding upcoming cardiac imaging study; pt verbalizes understanding of appt date/time, parking situation and where to check in, pre-test NPO status, and verified current allergies; name and call back number provided for further questions should they arise  Larey Brick RN Navigator Cardiac Imaging [facility] [phone] office [phone] cell  Patient aware to arrive at 12 PM.

## 2023-08-08 ENCOUNTER — Ambulatory Visit (HOSPITAL_COMMUNITY)
Admission: RE | Admit: 2023-08-08 | Discharge: 2023-08-08 | Disposition: A | Discharge status: Home / Self Care | Payer: Medicare Other | Source: Ambulatory Visit | Attending: Cardiology | Admitting: Cardiology

## 2023-08-08 DIAGNOSIS — R0789 Other chest pain: Secondary | ICD-10-CM | POA: Insufficient documentation

## 2023-08-08 DIAGNOSIS — R072 Precordial pain: Secondary | ICD-10-CM | POA: Diagnosis not present

## 2023-08-08 MED ORDER — NITROGLYCERIN 0.4 MG SL SUBL
SUBLINGUAL_TABLET | SUBLINGUAL | Status: AC
  Filled 2023-08-08: qty 2

## 2023-08-08 MED ORDER — IOPAMIDOL (ISOVUE-370) INJECTION 76%
95.0000 mL | Freq: Once | INTRAVENOUS | Status: AC | PRN
  Administered 2023-08-08: 95 mL via INTRAVENOUS

## 2023-08-08 MED ORDER — NITROGLYCERIN 0.4 MG SL SUBL
0.8000 mg | SUBLINGUAL_TABLET | Freq: Once | SUBLINGUAL | Status: AC
  Administered 2023-08-08: 0.8 mg via SUBLINGUAL

## 2023-08-11 ENCOUNTER — Telehealth: Payer: Self-pay

## 2023-08-11 NOTE — Telephone Encounter (Signed)
Patient notified through my chart.

## 2023-08-11 NOTE — Telephone Encounter (Signed)
-----   Message from Gypsy Balsam sent at 08/10/2023 11:39 AM EST ----- Coronary CT angio normal coronary arteries

## 2023-08-14 DIAGNOSIS — M170 Bilateral primary osteoarthritis of knee: Secondary | ICD-10-CM | POA: Diagnosis not present

## 2023-08-16 ENCOUNTER — Other Ambulatory Visit: Payer: Self-pay | Admitting: Family Medicine

## 2023-08-16 DIAGNOSIS — Z1231 Encounter for screening mammogram for malignant neoplasm of breast: Secondary | ICD-10-CM

## 2023-08-16 NOTE — Progress Notes (Signed)
ACUTE VISIT Chief Complaint  Patient presents with   Breast Pain    Left side x 2 months, needed to see a provider in order to get a diagnostic mammogram.    HPI: Nancy Christensen is a 72 y.o. female with a PMHx significant for HTN, sinus bradycardia, pulmonary nodules, OA, HLD, and Vitamin D deficiency, who is here today complaining of left breast discomfort as described above. 2 months of left outer upper breast soreness, intermittent, not radiated, max 6/10. She also noted a tender "tubular" lesion, which she has had for sometime but seems more "pronounce" lately. No associated fever, chills, abnormal weight loss, cough, wheezing, dyspnea, breast skin changes, or nipple discharge.  No hx of trauma. No hx of abnormal mammogram. Family history negative for breast cancer and ovarian cancer.  About 20 years ago she had a cyst removed from same breast. She reports history of fibrocystic breast disease and inverted nipple.  Problem has been stable. Occasionally she takes Ibuprofen, not often. Last mammogram on 08/31/2022 Bi-Rads 1.  Review of Systems  Constitutional:  Negative for activity change and appetite change.  HENT:  Negative for sore throat and trouble swallowing.   Cardiovascular:  Negative for chest pain and palpitations.  Gastrointestinal:  Negative for abdominal pain, nausea and vomiting.  Genitourinary:  Negative for pelvic pain, vaginal bleeding and vaginal discharge.  See other pertinent positives and negatives in HPI.  Current Outpatient Medications on File Prior to Visit  Medication Sig Dispense Refill   irbesartan (AVAPRO) 75 MG tablet TAKE ONE TABLETS BY MOUTH DAILY (Patient taking differently: Take 75 mg by mouth daily. TAKE ONE TABLETS BY MOUTH DAILY) 90 tablet 1   VITAMIN B1-B12 IJ Inject 1 Dose as directed every 30 (thirty) days.     Vitamin D, Ergocalciferol, (DRISDOL) 1.25 MG (50000 UNIT) CAPS capsule Take 1 capsule (50,000 Units total) by mouth every 7  (seven) days. 12 capsule 0   No current facility-administered medications on file prior to visit.   Past Medical History:  Diagnosis Date   Arthritis    left hip and knee   Atypical chest pain 10/25/2018   Cholecystitis with cholelithiasis 02/11/2018   Chronic right shoulder pain 07/04/2018   Constipation due to outlet dysfunction    Dizziness 10/25/2018   Dyslipidemia 10/25/2018   Essential hypertension 04/28/2018   Changed arb to CCB 04/24/2018 due to cough x one month trial    Hyperlipidemia    Hypertension    Hypertensive disorder 04/18/2018   Multiple pulmonary nodules determined by computed tomography of lung 01/26/2018   Passive smoke exp/ remote  Chest CT  09/24/17 MPN's  Largest 4 x 6 mm R laterally  > rec f/u 09/14/18 (reminder file)   As pt reports neg CT 2015 (not avail)  - ESR 01/25/2018 = 35  - CT chest 07/11/18 =  5 mm > no f/u needed    Primary osteoarthritis of left hip 11/26/2019   Primary osteoarthritis of left knee 03/11/2020   Rectal pain    Sinus bradycardia 10/25/2018   Unilateral primary osteoarthritis, left knee 06/14/2018   Upper airway cough syndrome 01/25/2018   Onset 1980 CT head 12/22/17 neg sinus dz FENO 01/25/2018  =   21 - Allergy profile 01/25/2018 >  Eos 0.1 /  IgE  17 RAST neg  - flare 02/12/18 p et  - gabapentin 100 mg tid 03/23/2018 > increased to max of 300 tid 04/24/2018 > improved so increased to 300 qid  06/05/2018 > d/c'd around 02/2019 s flare    No Known Allergies  Social History   Socioeconomic History   Marital status: Divorced    Spouse name: Not on file   Number of children: Not on file   Years of education: Not on file   Highest education level: Bachelor's degree (e.g., BA, AB, BS)  Occupational History   Not on file  Tobacco Use   Smoking status: Never   Smokeless tobacco: Never  Vaping Use   Vaping status: Never Used  Substance and Sexual Activity   Alcohol use: Never   Drug use: Never   Sexual activity: Not Currently    Birth control/protection:  Post-menopausal  Other Topics Concern   Not on file  Social History Narrative   Not on file   Social Determinants of Health   Financial Resource Strain: Low Risk  (06/20/2023)   Overall Financial Resource Strain (CARDIA)    Difficulty of Paying Living Expenses: Not hard at all  Food Insecurity: No Food Insecurity (06/20/2023)   Hunger Vital Sign    Worried About Running Out of Food in the Last Year: Never true    Ran Out of Food in the Last Year: Never true  Transportation Needs: No Transportation Needs (06/20/2023)   PRAPARE - Administrator, Civil Service (Medical): No    Lack of Transportation (Non-Medical): No  Physical Activity: Insufficiently Active (06/20/2023)   Exercise Vital Sign    Days of Exercise per Week: 2 days    Minutes of Exercise per Session: 40 min  Stress: No Stress Concern Present (06/20/2023)   [employer] of Occupational Health - Occupational Stress Questionnaire    Feeling of Stress : Only a little  Social Connections: Moderately Integrated (06/20/2023)   Social Connection and Isolation Panel [NHANES]    Frequency of Communication with Friends and Family: More than three times a week    Frequency of Social Gatherings with Friends and Family: Twice a week    Attends Religious Services: More than 4 times per year    Active Member of [employer] or Organizations: Yes    Attends Banker Meetings: More than 4 times per year    Marital Status: Divorced   Vitals:   08/21/23 0727  BP: 136/80  Pulse: (!) 50  Resp: 16  Temp: 97.9 F (36.6 C)  SpO2: 98%   Body mass index is 36.67 kg/m.  Physical Exam Vitals and nursing note reviewed.  Constitutional:      General: She is not in acute distress.    Appearance: She is well-developed.  HENT:     Head: Normocephalic and atraumatic.  Eyes:     Conjunctiva/sclera: Conjunctivae normal.  Cardiovascular:     Rate and Rhythm: Bradycardia present.  Pulmonary:     Effort: Pulmonary effort  is normal. No respiratory distress.     Breath sounds: Normal breath sounds.  Chest:  Breasts:    Right: Inverted nipple present. No mass or nipple discharge.     Left: Inverted nipple present. No nipple discharge.       Comments: Declined chaperone. . Lymphadenopathy:     Cervical: No cervical adenopathy.     Upper Body:     Right upper body: No supraclavicular or axillary adenopathy.     Left upper body: No supraclavicular or axillary adenopathy.  Skin:    General: Skin is warm.     Findings: No erythema or rash.  Neurological:  Mental Status: She is alert and oriented to person, place, and time.  Psychiatric:        Mood and Affect: Mood and affect normal.   ASSESSMENT AND PLAN:  Ms. Fejeran was seen today for left breast discomfort.   Breast tenderness in female Elicited during examination today, no skin changes or nipple discharge. We discussed possible etiologies, symptoms could be related to fibrocystic breast disease. Diagnostic mammogram and [place] order placed.  -     MM 3D DIAGNOSTIC MAMMOGRAM BILATERAL BREAST; Future -     [place] LIMITED ULTRASOUND INCLUDING AXILLA LEFT BREAST ; Future  Fibrocystic disease of left breast We discussed diagnosis, prognosis, and treatment options. Continue monitoring for changes.  Return if symptoms worsen or fail to improve.  Cael Worth G. [place], MD  [facility]. [place] office.

## 2023-08-21 ENCOUNTER — Ambulatory Visit (INDEPENDENT_AMBULATORY_CARE_PROVIDER_SITE_OTHER): Payer: Medicare Other | Admitting: Family Medicine

## 2023-08-21 ENCOUNTER — Encounter: Payer: Self-pay | Admitting: Family Medicine

## 2023-08-21 VITALS — BP 136/80 | HR 50 | Temp 97.9°F | Resp 16 | Ht 65.0 in | Wt 220.4 lb

## 2023-08-21 DIAGNOSIS — N644 Mastodynia: Secondary | ICD-10-CM | POA: Diagnosis not present

## 2023-08-21 DIAGNOSIS — N6012 Diffuse cystic mastopathy of left breast: Secondary | ICD-10-CM | POA: Diagnosis not present

## 2023-08-21 NOTE — Patient Instructions (Addendum)
A few things to remember from today's visit:  Breast tenderness in female - Plan: MM 3D DIAGNOSTIC MAMMOGRAM BILATERAL BREAST, [place] LIMITED ULTRASOUND INCLUDING AXILLA LEFT BREAST   Fibrocystic disease of left breast   Do not use My Chart to request refills or for acute issues that need immediate attention. If you send a my chart message, it may take a few days to be addressed, specially if I am not in the office.  Please be sure medication list is accurate. If a new problem present, please set up appointment sooner than planned today.

## 2023-08-28 ENCOUNTER — Ambulatory Visit (INDEPENDENT_AMBULATORY_CARE_PROVIDER_SITE_OTHER): Payer: Medicare Other

## 2023-08-28 DIAGNOSIS — E538 Deficiency of other specified B group vitamins: Secondary | ICD-10-CM | POA: Diagnosis not present

## 2023-08-28 MED ORDER — CYANOCOBALAMIN 1000 MCG/ML IJ SOLN
1000.0000 ug | Freq: Once | INTRAMUSCULAR | Status: AC
  Administered 2023-08-28: 1000 ug via INTRAMUSCULAR

## 2023-08-28 NOTE — Progress Notes (Unsigned)
Per orders of Dr. Salomon Fick, injection of Cyanocobalamin given by Stann Ore. Patient tolerated injection well.

## 2023-08-30 ENCOUNTER — Encounter: Payer: Self-pay | Admitting: Family Medicine

## 2023-08-30 ENCOUNTER — Ambulatory Visit (INDEPENDENT_AMBULATORY_CARE_PROVIDER_SITE_OTHER): Payer: Medicare Other | Admitting: Family Medicine

## 2023-08-30 ENCOUNTER — Ambulatory Visit (INDEPENDENT_AMBULATORY_CARE_PROVIDER_SITE_OTHER): Payer: Medicare Other

## 2023-08-30 DIAGNOSIS — M25471 Effusion, right ankle: Secondary | ICD-10-CM

## 2023-08-30 DIAGNOSIS — M7731 Calcaneal spur, right foot: Secondary | ICD-10-CM | POA: Diagnosis not present

## 2023-08-30 DIAGNOSIS — M25571 Pain in right ankle and joints of right foot: Secondary | ICD-10-CM | POA: Diagnosis not present

## 2023-08-30 DIAGNOSIS — R600 Localized edema: Secondary | ICD-10-CM | POA: Diagnosis not present

## 2023-08-30 DIAGNOSIS — M7989 Other specified soft tissue disorders: Secondary | ICD-10-CM | POA: Diagnosis not present

## 2023-08-30 NOTE — Progress Notes (Signed)
Established Patient Office Visit   Subjective  Patient ID: Nancy Christensen, female    DOB: [birth year]  Age: 72 y.o. MRN: [id number]  Chief Complaint  Patient presents with   Foot Swelling    Right and foot and ankle pain and swelling, started 2 weeks ago, on the back and lateral side of the foot and ankle, patient denies any injury, rate of pain 6 out of 10,     Patient is a 72 year old female seen for acute ankle injury.  Patient endorses right ankle pain and edema x 2 weeks.  Denies any injury or increased activity.  States initially noticed ankle discomfort with going downstairs.  Edema in posterior lateral right ankle.  Pain 6/10.  Patient has not take anything for pain.  Now keeping foot flat instead of walking on toes.    Patient Active Problem List   Diagnosis Date Noted   Vitamin D deficiency 06/27/2023   Puncture wound of toe of left foot 05/06/2022   Hypertension    Hyperlipidemia    Arthritis    Primary osteoarthritis of left knee 03/11/2020   Primary osteoarthritis of left hip 11/26/2019   Constipation due to outlet dysfunction    Rectal pain    Dizziness 10/25/2018   Sinus bradycardia 10/25/2018   Atypical chest pain 10/25/2018   Dyslipidemia 10/25/2018   Chronic right shoulder pain 07/04/2018   Unilateral primary osteoarthritis, left knee 06/14/2018   Essential hypertension 04/28/2018   Hypertensive disorder 04/18/2018   Cholecystitis with cholelithiasis 02/11/2018   Multiple pulmonary nodules determined by computed tomography of lung 01/26/2018   Upper airway cough syndrome 01/25/2018   Past Medical History:  Diagnosis Date   Arthritis    left hip and knee   Atypical chest pain 10/25/2018   Cholecystitis with cholelithiasis 02/11/2018   Chronic right shoulder pain 07/04/2018   Constipation due to outlet dysfunction    Dizziness 10/25/2018   Dyslipidemia 10/25/2018   Essential hypertension 04/28/2018   Changed arb to CCB 04/24/2018 due to cough x one month  trial    Hyperlipidemia    Hypertension    Hypertensive disorder 04/18/2018   Multiple pulmonary nodules determined by computed tomography of lung 01/26/2018   Passive smoke exp/ remote  Chest CT  09/24/17 MPN's  Largest 4 x 6 mm R laterally  > rec f/u 09/14/18 (reminder file)   As pt reports neg CT 2015 (not avail)  - ESR 01/25/2018 = 35  - CT chest 07/11/18 =  5 mm > no f/u needed    Primary osteoarthritis of left hip 11/26/2019   Primary osteoarthritis of left knee 03/11/2020   Rectal pain    Sinus bradycardia 10/25/2018   Unilateral primary osteoarthritis, left knee 06/14/2018   Upper airway cough syndrome 01/25/2018   Onset 1980 CT head 12/22/17 neg sinus dz FENO 01/25/2018  =   21 - Allergy profile 01/25/2018 >  Eos 0.1 /  IgE  17 RAST neg  - flare 02/12/18 p et  - gabapentin 100 mg tid 03/23/2018 > increased to max of 300 tid 04/24/2018 > improved so increased to 300 qid 06/05/2018 > d/c'd around 02/2019 s flare    Past Surgical History:  Procedure Laterality Date   ABDOMINAL HYSTERECTOMY  1997   ANAL RECTAL MANOMETRY N/A 08/09/2019   Procedure: ANO RECTAL MANOMETRY;  Surgeon: Tressia Danas, MD;  Location: [facility];  Service: Gastroenterology;  Laterality: N/A;   BREAST SURGERY  2002   left cyst removal  CHOLECYSTECTOMY N/A 02/12/2018   Procedure: LAPAROSCOPIC CHOLECYSTECTOMY;  Surgeon: Romie Levee, MD;  Location: [facility];  Service: General;  Laterality: N/A;   COLONOSCOPY  07/12/2019   POLYPECTOMY     Social History   Tobacco Use   Smoking status: Never   Smokeless tobacco: Never  Vaping Use   Vaping status: Never Used  Substance Use Topics   Alcohol use: Never   Drug use: Never   Family History  Problem Relation Age of Onset   Diabetes Mother    Hypertension Mother    Thyroid disease Mother    Vascular Disease Mother    Lung cancer Father    Colon cancer Maternal Uncle    Colon polyps Neg Hx    Esophageal cancer Neg Hx    Stomach cancer Neg Hx    Rectal cancer Neg Hx     Breast cancer Neg Hx    No Known Allergies    ROS Negative unless stated above    Objective:     There were no vitals taken for this visit. BP Readings from Last 3 Encounters:  08/21/23 136/80  08/08/23 (!) 171/86  07/18/23 130/80   Wt Readings from Last 3 Encounters:  08/21/23 220 lb 6 oz (100 kg)  07/18/23 217 lb 12.8 oz (98.8 kg)  06/21/23 217 lb 9.6 oz (98.7 kg)      Physical Exam Constitutional:      Appearance: Normal appearance.  HENT:     Head: Normocephalic and atraumatic.     Nose: Nose normal.     Mouth/Throat:     Mouth: Mucous membranes are moist.  Eyes:     Extraocular Movements: Extraocular movements intact.     Conjunctiva/sclera: Conjunctivae normal.  Cardiovascular:     Rate and Rhythm: Normal rate.     Pulses: Normal pulses.     Heart sounds: Normal heart sounds.  Pulmonary:     Effort: Pulmonary effort is normal.  Musculoskeletal:     Right ankle: Swelling present. Tenderness present over the lateral malleolus.     Right Achilles Tendon: Tenderness present. Thompson's test negative.     Left ankle: Normal.     Left Achilles Tendon: Normal. Thompson's test negative.       Legs:  Skin:    General: Skin is warm and dry.  Neurological:     Mental Status: She is alert and oriented to person, place, and time. Mental status is at baseline.      No results found for any visits on 08/30/23.    Assessment & Plan:  Acute right ankle pain -     DG Ankle Complete Right  Edema of right ankle -     DG Ankle Complete Right  Patient with acute ankle pain and edema.  Though no injury noted concern for sprain, Achilles tendinitis, also consider Achilles tendon partial tear.  Gout less likely.  Supportive care advised including compression, ice, elevation, exercises, Tylenol or NSAIDs as needed.  Will obtain x-ray.  Update: X-ray right ankle with generalized soft tissue swelling with chronic appearing deformity of the posterior malleolus.  No  acute osseous abnormality identified in the ankle.  Bone spurs also noted.  Continue supportive care as discussed.  For continued or worsening symptoms consider MRI.  Return if symptoms worsen or fail to improve.   Deeann Saint, MD

## 2023-08-31 ENCOUNTER — Encounter: Payer: Self-pay | Admitting: Cardiology

## 2023-08-31 ENCOUNTER — Ambulatory Visit: Payer: Medicare Other | Attending: Cardiology | Admitting: Cardiology

## 2023-08-31 VITALS — BP 170/100 | HR 51 | Ht 65.0 in | Wt 217.0 lb

## 2023-08-31 DIAGNOSIS — R0789 Other chest pain: Secondary | ICD-10-CM | POA: Diagnosis not present

## 2023-08-31 DIAGNOSIS — I10 Essential (primary) hypertension: Secondary | ICD-10-CM | POA: Diagnosis not present

## 2023-08-31 DIAGNOSIS — R001 Bradycardia, unspecified: Secondary | ICD-10-CM | POA: Insufficient documentation

## 2023-08-31 DIAGNOSIS — E785 Hyperlipidemia, unspecified: Secondary | ICD-10-CM | POA: Insufficient documentation

## 2023-08-31 NOTE — Progress Notes (Signed)
Cardiology Office Note:    Date:  08/31/2023   ID:  Nancy Christensen, DOB Sep 16, 1951, MRN [id number]  PCP:  Deeann Saint, MD  Cardiologist:  Gypsy Balsam, MD    Referring MD: Deeann Saint, MD   Chief Complaint  Patient presents with   Follow-up  Doing fine  History of Present Illness:    Nancy Christensen is a 72 y.o. female who was referred to [place] because of atypical chest pain, dyslipidemia, bradycardia.  Comes today for follow-up overall doing well.  No dizziness no passing out.  She did have coronary CT angio which showed calcium score 0, no coronary artery disease.  No obstruction no plaque.  She is very pleased with the results of it but still asking what is the pain that she is getting she said pain is located in the left side of her chest not related to exercise.  Past Medical History:  Diagnosis Date   Arthritis    left hip and knee   Atypical chest pain 10/25/2018   Cholecystitis with cholelithiasis 02/11/2018   Chronic right shoulder pain 07/04/2018   Constipation due to outlet dysfunction    Dizziness 10/25/2018   Dyslipidemia 10/25/2018   Essential hypertension 04/28/2018   Changed arb to CCB 04/24/2018 due to cough x one month trial    Hyperlipidemia    Hypertension    Hypertensive disorder 04/18/2018   Multiple pulmonary nodules determined by computed tomography of lung 01/26/2018   Passive smoke exp/ remote  Chest CT  09/24/17 MPN's  Largest 4 x 6 mm R laterally  > rec f/u 09/14/18 (reminder file)   As pt reports neg CT 2015 (not avail)  - ESR 01/25/2018 = 35  - CT chest 07/11/18 =  5 mm > no f/u needed    Primary osteoarthritis of left hip 11/26/2019   Primary osteoarthritis of left knee 03/11/2020   Rectal pain    Sinus bradycardia 10/25/2018   Unilateral primary osteoarthritis, left knee 06/14/2018   Upper airway cough syndrome 01/25/2018   Onset 1980 CT head 12/22/17 neg sinus dz FENO 01/25/2018  =   21 - Allergy profile 01/25/2018 >  Eos 0.1 /  IgE  17 RAST neg  - flare  02/12/18 p et  - gabapentin 100 mg tid 03/23/2018 > increased to max of 300 tid 04/24/2018 > improved so increased to 300 qid 06/05/2018 > d/c'd around 02/2019 s flare     Past Surgical History:  Procedure Laterality Date   ABDOMINAL HYSTERECTOMY  1997   ANAL RECTAL MANOMETRY N/A 08/09/2019   Procedure: ANO RECTAL MANOMETRY;  Surgeon: Tressia Danas, MD;  Location: [facility];  Service: Gastroenterology;  Laterality: N/A;   BREAST SURGERY  2002   left cyst removal    CHOLECYSTECTOMY N/A 02/12/2018   Procedure: LAPAROSCOPIC CHOLECYSTECTOMY;  Surgeon: Romie Levee, MD;  Location: [facility];  Service: General;  Laterality: N/A;   COLONOSCOPY  07/12/2019   POLYPECTOMY      Current Medications: Current Meds  Medication Sig   irbesartan (AVAPRO) 75 MG tablet TAKE ONE TABLETS BY MOUTH DAILY   VITAMIN B1-B12 IJ Inject 1 Dose as directed every 30 (thirty) days.   Vitamin D, Ergocalciferol, (DRISDOL) 1.25 MG (50000 UNIT) CAPS capsule Take 1 capsule (50,000 Units total) by mouth every 7 (seven) days.     Allergies:   Patient has no known allergies.   Social History   Socioeconomic History   Marital status: Divorced  Spouse name: Not on file   Number of children: Not on file   Years of education: Not on file   Highest education level: Bachelor's degree (e.g., BA, AB, BS)  Occupational History   Not on file  Tobacco Use   Smoking status: Never   Smokeless tobacco: Never  Vaping Use   Vaping status: Never Used  Substance and Sexual Activity   Alcohol use: Never   Drug use: Never   Sexual activity: Not Currently    Birth control/protection: Post-menopausal  Other Topics Concern   Not on file  Social History Narrative   Not on file   Social Determinants of Health   Financial Resource Strain: Low Risk  (08/29/2023)   Overall Financial Resource Strain (CARDIA)    Difficulty of Paying Living Expenses: Not hard at all  Food Insecurity: No Food Insecurity (08/29/2023)   Hunger  Vital Sign    Worried About Running Out of Food in the Last Year: Never true    Ran Out of Food in the Last Year: Never true  Transportation Needs: No Transportation Needs (08/29/2023)   PRAPARE - Administrator, Civil Service (Medical): No    Lack of Transportation (Non-Medical): No  Physical Activity: Insufficiently Active (08/29/2023)   Exercise Vital Sign    Days of Exercise per Week: 2 days    Minutes of Exercise per Session: 30 min  Stress: No Stress Concern Present (08/29/2023)   [employer] of Occupational Health - Occupational Stress Questionnaire    Feeling of Stress : Only a little  Social Connections: Moderately Integrated (08/29/2023)   Social Connection and Isolation Panel [NHANES]    Frequency of Communication with Friends and Family: More than three times a week    Frequency of Social Gatherings with Friends and Family: Once a week    Attends Religious Services: More than 4 times per year    Active Member of [employer] or Organizations: Yes    Attends Engineer, structural: More than 4 times per year    Marital Status: Divorced     Family History: The patient's family history includes Colon cancer in her maternal uncle; Diabetes in her mother; Hypertension in her mother; Lung cancer in her father; Thyroid disease in her mother; Vascular Disease in her mother. There is no history of Colon polyps, Esophageal cancer, Stomach cancer, Rectal cancer, or Breast cancer. ROS:   Please see the history of present illness.    All 14 point review of systems negative except as described per history of present illness  EKGs/Labs/Other Studies Reviewed:         Recent Labs: 06/22/2023: ALT 10; BUN 21; Creatinine, Ser 1.26; Hemoglobin 13.3; Platelets 203.0; Potassium 3.8; Sodium 141; TSH 1.59  Recent Lipid Panel    Component Value Date/Time   CHOL 267 (H) 03/02/2021 1024   TRIG 95 03/02/2021 1024   HDL 52 03/02/2021 1024   CHOLHDL 5.1 (H) 03/02/2021 1024    CHOLHDL 3.9 04/03/2020 1115   VLDL 26.6 06/19/2019 0849   LDLCALC 199 (H) 03/02/2021 1024   LDLCALC 140 (H) 04/03/2020 1115   LDLDIRECT 155 (H) 12/14/2021 1643    Physical Exam:    VS:  BP (!) 182/106 (BP Location: Left Arm, Patient Position: Sitting, Cuff Size: Normal)   Pulse (!) 51   Ht 5\' 5"  (1.651 m)   Wt 217 lb (98.4 kg)   SpO2 98%   BMI 36.11 kg/m     Wt Readings from Last  3 Encounters:  08/31/23 217 lb (98.4 kg)  08/21/23 220 lb 6 oz (100 kg)  07/18/23 217 lb 12.8 oz (98.8 kg)     GEN:  Well nourished, well developed in no acute distress HEENT: Normal NECK: No JVD; No carotid bruits LYMPHATICS: No lymphadenopathy CARDIAC: RRR, no murmurs, no rubs, no gallops RESPIRATORY:  Clear to auscultation without rales, wheezing or rhonchi  ABDOMEN: Soft, non-tender, non-distended MUSCULOSKELETAL:  No edema; No deformity  SKIN: Warm and dry LOWER EXTREMITIES: no swelling NEUROLOGIC:  Alert and oriented x 3 PSYCHIATRIC:  Normal affect   ASSESSMENT:    1. Sinus bradycardia   2. Essential hypertension   3. Atypical chest pain   4. Dyslipidemia    PLAN:    In order of problems listed above:  Sinus bradycardia not critical continue monitoring.  Asked to let me know if she passed out again dizzy. Essential hypertension interestingly blood pressure elevated today but yesterday in doctor's office was perfect and she said she checked it at home is good.  I will ask her to have blood pressure to be rechecked before he leaves the room. Atypical chest pain not related to her heart.  Her coronary CT angio was normal with calcium score of 0.  No plaque whatsoever. Dyslipidemia I did review her lab work test.  She does have elevated LDL I did calculate her predicted 10 years risk for heart problem with today's blood pressure is 5.7 which is intermediate risk but with her calcium score 0 does not warrant any statin therapy, if I interpreted data with normal blood pressure which usually  is that risk drops only to 4.3% per 10 years which is very low risk.  No need to treat interestingly when I take away her calcium score which is 0 risk is 13.4 so clearly low calcium score meaning 0 decrease significantly risk.  We had a long discussion about need to exercise on the regular basis and also good diet.   Medication Adjustments/Labs and Tests Ordered: Current medicines are reviewed at length with the patient today.  Concerns regarding medicines are outlined above.  No orders of the defined types were placed in this encounter.  Medication changes: No orders of the defined types were placed in this encounter.   Signed, Georgeanna Lea, MD, [facility] 08/31/2023 9:59 AM    [facility]

## 2023-08-31 NOTE — Patient Instructions (Signed)

## 2023-09-02 ENCOUNTER — Other Ambulatory Visit: Payer: Medicare Other

## 2023-09-07 ENCOUNTER — Ambulatory Visit
Admission: RE | Admit: 2023-09-07 | Discharge: 2023-09-07 | Disposition: A | Discharge status: Home / Self Care | Payer: Medicare Other | Source: Ambulatory Visit | Attending: Family Medicine | Admitting: Family Medicine

## 2023-09-07 DIAGNOSIS — N644 Mastodynia: Secondary | ICD-10-CM

## 2023-09-11 ENCOUNTER — Other Ambulatory Visit: Payer: Medicare Other

## 2023-09-12 ENCOUNTER — Other Ambulatory Visit: Payer: Medicare Other

## 2023-09-13 DIAGNOSIS — M170 Bilateral primary osteoarthritis of knee: Secondary | ICD-10-CM | POA: Diagnosis not present

## 2023-09-27 DIAGNOSIS — J019 Acute sinusitis, unspecified: Secondary | ICD-10-CM | POA: Diagnosis not present

## 2023-10-04 DIAGNOSIS — J019 Acute sinusitis, unspecified: Secondary | ICD-10-CM | POA: Diagnosis not present

## 2023-10-06 ENCOUNTER — Other Ambulatory Visit: Payer: Self-pay | Admitting: Family Medicine

## 2023-10-06 DIAGNOSIS — I10 Essential (primary) hypertension: Secondary | ICD-10-CM

## 2023-10-06 DIAGNOSIS — J019 Acute sinusitis, unspecified: Secondary | ICD-10-CM | POA: Diagnosis not present

## 2023-10-06 DIAGNOSIS — M9901 Segmental and somatic dysfunction of cervical region: Secondary | ICD-10-CM | POA: Diagnosis not present

## 2023-10-06 DIAGNOSIS — M6240 Contracture of muscle, unspecified site: Secondary | ICD-10-CM | POA: Diagnosis not present

## 2023-10-09 DIAGNOSIS — M6240 Contracture of muscle, unspecified site: Secondary | ICD-10-CM | POA: Diagnosis not present

## 2023-10-09 DIAGNOSIS — M9901 Segmental and somatic dysfunction of cervical region: Secondary | ICD-10-CM | POA: Diagnosis not present

## 2023-10-09 DIAGNOSIS — J019 Acute sinusitis, unspecified: Secondary | ICD-10-CM | POA: Diagnosis not present

## 2023-10-10 ENCOUNTER — Ambulatory Visit: Payer: Self-pay | Admitting: Family Medicine

## 2023-10-10 DIAGNOSIS — M79662 Pain in left lower leg: Secondary | ICD-10-CM | POA: Diagnosis not present

## 2023-10-10 DIAGNOSIS — I10 Essential (primary) hypertension: Secondary | ICD-10-CM | POA: Diagnosis not present

## 2023-10-10 DIAGNOSIS — I872 Venous insufficiency (chronic) (peripheral): Secondary | ICD-10-CM | POA: Diagnosis not present

## 2023-10-10 DIAGNOSIS — R600 Localized edema: Secondary | ICD-10-CM | POA: Diagnosis not present

## 2023-10-10 NOTE — Telephone Encounter (Signed)
 Pt checking up on progress of med request of Avapro . Pt called in Friday. RN advised pt there is a 3 day window to work through. RN instructed pt to ask pharmacy for a few days of medication until order can be processed. Pt verbalized understanding. Pt denies any issues and was very pleasant.           Copied from CRM [id number]. Topic: Clinical - Medication Question >> Oct 10, 2023  1:57 PM Burnard DEL wrote: Reason for CRM: patient called in to check on medication  irbesartan  (AVAPRO ) 75 MG tablet  being sent into the pharmacy for her.She is scheduled to see her next week and was wondering if shes going to have to wait to see her to get this medication because she is out. Answer Assessment - Initial Assessment Questions 1. DRUG NAME: What medicine do you need to have refilled?     avapro  2. REFILLS REMAINING: How many refills are remaining? (Note: The label on the medicine or pill bottle will show how many refills are remaining. If there are no refills remaining, then a renewal may be needed.)     0 3. EXPIRATION DATE: What is the expiration date? (Note: The label states when the prescription will expire, and thus can no longer be refilled.)     na 4. PRESCRIBING HCP: Who prescribed it? Reason: If prescribed by specialist, call should be referred to that group.     [place] 5. SYMPTOMS: Do you have any symptoms?     denies  Protocols used: Medication Refill and Renewal Call-A-AH

## 2023-10-10 NOTE — Telephone Encounter (Signed)
Medication sent, patient is aware 

## 2023-10-12 DIAGNOSIS — M6240 Contracture of muscle, unspecified site: Secondary | ICD-10-CM | POA: Diagnosis not present

## 2023-10-12 DIAGNOSIS — M9901 Segmental and somatic dysfunction of cervical region: Secondary | ICD-10-CM | POA: Diagnosis not present

## 2023-10-12 DIAGNOSIS — J019 Acute sinusitis, unspecified: Secondary | ICD-10-CM | POA: Diagnosis not present

## 2023-10-16 DIAGNOSIS — M170 Bilateral primary osteoarthritis of knee: Secondary | ICD-10-CM | POA: Diagnosis not present

## 2023-10-19 ENCOUNTER — Encounter: Payer: Self-pay | Admitting: Family Medicine

## 2023-10-19 ENCOUNTER — Ambulatory Visit: Payer: Medicare Other | Admitting: Family Medicine

## 2023-10-19 VITALS — BP 162/94 | HR 89 | Temp 98.0°F | Ht 65.0 in | Wt 219.2 lb

## 2023-10-19 DIAGNOSIS — J019 Acute sinusitis, unspecified: Secondary | ICD-10-CM | POA: Diagnosis not present

## 2023-10-19 DIAGNOSIS — M6240 Contracture of muscle, unspecified site: Secondary | ICD-10-CM | POA: Diagnosis not present

## 2023-10-19 DIAGNOSIS — I10 Essential (primary) hypertension: Secondary | ICD-10-CM | POA: Diagnosis not present

## 2023-10-19 DIAGNOSIS — M1712 Unilateral primary osteoarthritis, left knee: Secondary | ICD-10-CM | POA: Diagnosis not present

## 2023-10-19 DIAGNOSIS — J3089 Other allergic rhinitis: Secondary | ICD-10-CM | POA: Diagnosis not present

## 2023-10-19 DIAGNOSIS — M9901 Segmental and somatic dysfunction of cervical region: Secondary | ICD-10-CM | POA: Diagnosis not present

## 2023-10-19 MED ORDER — IRBESARTAN 75 MG PO TABS: 112.5000 mg | ORAL_TABLET | Freq: Every day | ORAL | 1 refills | Status: DC

## 2023-10-19 NOTE — Progress Notes (Signed)
Established Patient Office Visit   Subjective  Patient ID: Nancy Christensen, female    DOB: [birth year]  Age: 73 y.o. MRN: [id number]  Chief Complaint  Patient presents with   Medical Management of Chronic Issues    Patient is 73 year old female seen for follow-up.  Patient notes BP elevation over the last month and a half.  Patient denies any changes in diet or medications.  Taking irbesartan 75 mg daily.  Took BP meds this morning around 7 AM.  BP at home 140/80-82.  Patient has been dealing with sinusitis off and on since the end of December.  Notes some improvement status post a round of amoxicillin and doxycycline.  Still having postnasal drainage and occasional dry cough.  Using Nettie pot and nasal spray.  Patient has been told she snores.  Wakes up feeling unrested.  Currently taking naps if able.  Occasionally falling asleep at home without meaning to take a nap.  Patient notes left knee beginning to hurt again.  Several years ago seen by Dr. Rhett Bannister for knee injection.    Patient Active Problem List   Diagnosis Date Noted   Vitamin D deficiency 06/27/2023   Puncture wound of toe of left foot 05/06/2022   Hypertension    Hyperlipidemia    Arthritis    Primary osteoarthritis of left knee 03/11/2020   Primary osteoarthritis of left hip 11/26/2019   Constipation due to outlet dysfunction    Rectal pain    Dizziness 10/25/2018   Sinus bradycardia 10/25/2018   Atypical chest pain 10/25/2018   Dyslipidemia 10/25/2018   Chronic right shoulder pain 07/04/2018   Unilateral primary osteoarthritis, left knee 06/14/2018   Essential hypertension 04/28/2018   Hypertensive disorder 04/18/2018   Cholecystitis with cholelithiasis 02/11/2018   Multiple pulmonary nodules determined by computed tomography of lung 01/26/2018   Upper airway cough syndrome 01/25/2018   Past Medical History:  Diagnosis Date   Arthritis    left hip and knee   Atypical chest pain 10/25/2018   Cholecystitis  with cholelithiasis 02/11/2018   Chronic right shoulder pain 07/04/2018   Constipation due to outlet dysfunction    Dizziness 10/25/2018   Dyslipidemia 10/25/2018   Essential hypertension 04/28/2018   Changed arb to CCB 04/24/2018 due to cough x one month trial    Hyperlipidemia    Hypertension    Hypertensive disorder 04/18/2018   Multiple pulmonary nodules determined by computed tomography of lung 01/26/2018   Passive smoke exp/ remote  Chest CT  09/24/17 MPN's  Largest 4 x 6 mm R laterally  > rec f/u 09/14/18 (reminder file)   As pt reports neg CT 2015 (not avail)  - ESR 01/25/2018 = 35  - CT chest 07/11/18 =  5 mm > no f/u needed    Primary osteoarthritis of left hip 11/26/2019   Primary osteoarthritis of left knee 03/11/2020   Rectal pain    Sinus bradycardia 10/25/2018   Unilateral primary osteoarthritis, left knee 06/14/2018   Upper airway cough syndrome 01/25/2018   Onset 1980 CT head 12/22/17 neg sinus dz FENO 01/25/2018  =   21 - Allergy profile 01/25/2018 >  Eos 0.1 /  IgE  17 RAST neg  - flare 02/12/18 p et  - gabapentin 100 mg tid 03/23/2018 > increased to max of 300 tid 04/24/2018 > improved so increased to 300 qid 06/05/2018 > d/c'd around 02/2019 s flare    Past Surgical History:  Procedure Laterality Date   ABDOMINAL  HYSTERECTOMY  1997   ANAL RECTAL MANOMETRY N/A 08/09/2019   Procedure: ANO RECTAL MANOMETRY;  Surgeon: Tressia Danas, MD;  Location: [facility];  Service: Gastroenterology;  Laterality: N/A;   BREAST SURGERY  2002   left cyst removal    CHOLECYSTECTOMY N/A 02/12/2018   Procedure: LAPAROSCOPIC CHOLECYSTECTOMY;  Surgeon: Romie Levee, MD;  Location: [facility];  Service: General;  Laterality: N/A;   COLONOSCOPY  07/12/2019   POLYPECTOMY     Social History   Tobacco Use   Smoking status: Never   Smokeless tobacco: Never  Vaping Use   Vaping status: Never Used  Substance Use Topics   Alcohol use: Never   Drug use: Never   Family History  Problem Relation Age of Onset    Diabetes Mother    Hypertension Mother    Thyroid disease Mother    Vascular Disease Mother    Lung cancer Father    Colon cancer Maternal Uncle    Colon polyps Neg Hx    Esophageal cancer Neg Hx    Stomach cancer Neg Hx    Rectal cancer Neg Hx    Breast cancer Neg Hx    No Known Allergies    ROS Negative unless stated above    Objective:     BP (!) 162/94 (BP Location: Left Arm, Patient Position: Sitting, Cuff Size: Large)   Pulse 89   Temp 98 F (36.7 C) (Oral)   Ht 5\' 5"  (1.651 m)   Wt 219 lb 3.2 oz (99.4 kg)   SpO2 98%   BMI 36.48 kg/m  BP Readings from Last 3 Encounters:  10/19/23 (!) 162/94  08/31/23 (!) 170/100  08/21/23 136/80   Wt Readings from Last 3 Encounters:  10/19/23 219 lb 3.2 oz (99.4 kg)  08/31/23 217 lb (98.4 kg)  08/21/23 220 lb 6 oz (100 kg)      Physical Exam Constitutional:      General: She is not in acute distress.    Appearance: Normal appearance.  HENT:     Head: Normocephalic and atraumatic.     Nose: Nose normal.     Mouth/Throat:     Mouth: Mucous membranes are moist.  Cardiovascular:     Rate and Rhythm: Normal rate and regular rhythm.     Heart sounds: Normal heart sounds. No murmur heard.    No gallop.  Pulmonary:     Effort: Pulmonary effort is normal. No respiratory distress.     Breath sounds: Normal breath sounds. No wheezing, rhonchi or rales.  Skin:    General: Skin is warm and dry.  Neurological:     Mental Status: She is alert and oriented to person, place, and time.     No results found for any visits on 10/19/23.    Assessment & Plan:  Essential hypertension -     Home sleep test -     Irbesartan; Take 1.5 tablets (112.5 mg total) by mouth daily.  Dispense: 135 tablet; Refill: 1  Primary osteoarthritis of left knee -     Ambulatory referral to Orthopedic Surgery  Allergic rhinitis due to other allergic trigger, unspecified seasonality  BP uncontrolled.  Previously well-controlled on irbesartan 75  mg daily.  Will increase dose slowly as patient has a history of sensitive to medications.  Patient will take irbesartan 75 mg one half tabs daily for a total daily dose of 112.5 mg.  Continue to monitor BP at home.  Continue lifestyle modifications.  Pt notify clinic  of BP greater then 140/90 consistently.  Concern OSA may be contributing to BP elevation.  Order for home sleep study placed.  Left knee pain due to OA.  Referral to Ortho for repeat knee injection.  Switch antihistamines for allergic rhinitis.  For continued or worsening symptoms start ABX for presumed sinusitis.  Continue supportive care with Nettie pot and nasal sprays.  Return in about 5 weeks (around 11/23/2023), or if symptoms worsen or fail to improve, for blood pressure.   Deeann Saint, MD

## 2023-10-19 NOTE — Patient Instructions (Signed)
A new prescription for irbesartan 112 mg was sent to your pharmacy.  This means you would take irbesartan 75 mg one and a half tabs daily.  Continue to monitor your blood pressure at home.  An order for home sleep study was placed.  It might take a few weeks before they send the equipment to your home for the sleep study.  Consider switching to a different allergy medicine such as Allegra, Zyrtec, Xyzal.  Continue Nettie pot and nasal sprays as needed.  Referral to Ortho was placed.  They should call you about setting up the appointment for your knee.

## 2023-10-22 NOTE — Progress Notes (Unsigned)
Office Visit Note   Patient: Nancy Christensen           Date of Birth: 1951/06/03           MRN: [id number] Visit Date: 10/24/2023              Requested by: Deeann Saint, MD [address] [place],  [place] [address] PCP: Deeann Saint, MD   Assessment & Plan: Visit Diagnoses:  1. Primary osteoarthritis of left knee     Plan: Nancy Christensen is a 73 year old female with advanced left knee DJD.  She has had good relief from prior Monovisc injection.  Will go ahead and get approval for this again.  Handout for knee replacement provided.  She will think about her options.  This patient is diagnosed with osteoarthritis of the knee(s).    Radiographs show evidence of joint space narrowing, osteophytes, subchondral sclerosis and/or subchondral cysts.  This patient has knee pain which interferes with functional and activities of daily living.    This patient has experienced inadequate response, adverse effects and/or intolerance with conservative treatments such as acetaminophen, NSAIDS, topical creams, physical therapy or regular exercise, knee bracing and/or weight loss.   This patient has experienced inadequate response or has a contraindication to intra articular steroid injections for at least 3 months.   This patient is not scheduled to have a total knee replacement within 6 months of starting treatment with viscosupplementation.  Follow-Up Instructions: No follow-ups on file.   Orders:  Orders Placed This Encounter  Procedures   XR KNEE 3 VIEW LEFT   No orders of the defined types were placed in this encounter.     Procedures: No procedures performed   Clinical Data: No additional findings.   Subjective: Chief Complaint  Patient presents with   Left Knee - Pain    HPI Nancy Christensen comes in today for follow-up evaluation of left knee pain and DJD.  She has ongoing pain and stiffness.  Voltaren gel helps some.  Did flex agenic's last year which only gave  temporary relief.  The most relief she is gotten was for a Monovisc injection from 3 years ago. Review of Systems  Constitutional: Negative.   HENT: Negative.    Eyes: Negative.   Respiratory: Negative.    Cardiovascular: Negative.   Endocrine: Negative.   Musculoskeletal: Negative.   Neurological: Negative.   Hematological: Negative.   Psychiatric/Behavioral: Negative.    All other systems reviewed and are negative.    Objective: Vital Signs: There were no vitals taken for this visit.  Physical Exam Vitals and nursing note reviewed.  Constitutional:      Appearance: She is well-developed.  HENT:     Head: Atraumatic.     Nose: Nose normal.  Eyes:     Extraocular Movements: Extraocular movements intact.  Cardiovascular:     Pulses: Normal pulses.  Pulmonary:     Effort: Pulmonary effort is normal.  Abdominal:     Palpations: Abdomen is soft.  Musculoskeletal:     Cervical back: Neck supple.  Skin:    General: Skin is warm.     Capillary Refill: Capillary refill takes less than 2 seconds.  Neurological:     Mental Status: She is alert. Mental status is at baseline.  Psychiatric:        Behavior: Behavior normal.        Thought Content: Thought content normal.        Judgment: Judgment normal.  Ortho Exam Exam of the left knee shows no joint effusion.  She has medial and lateral joint line tenderness.  Pain and crepitus with range of motion.  Collaterals and cruciates are stable. Specialty Comments:  No specialty comments available.  Imaging: XR KNEE 3 VIEW LEFT Result Date: 10/24/2023 X-rays of the left knee show bone-on-bone joint space narrowing in all 3 compartments.  Degenerative spurring.    PMFS History: Patient Active Problem List   Diagnosis Date Noted   Vitamin D deficiency 06/27/2023   Puncture wound of toe of left foot 05/06/2022   Hypertension    Hyperlipidemia    Arthritis    Primary osteoarthritis of left knee 03/11/2020   Primary  osteoarthritis of left hip 11/26/2019   Constipation due to outlet dysfunction    Rectal pain    Dizziness 10/25/2018   Sinus bradycardia 10/25/2018   Atypical chest pain 10/25/2018   Dyslipidemia 10/25/2018   Chronic right shoulder pain 07/04/2018   Essential hypertension 04/28/2018   Hypertensive disorder 04/18/2018   Cholecystitis with cholelithiasis 02/11/2018   Multiple pulmonary nodules determined by computed tomography of lung 01/26/2018   Upper airway cough syndrome 01/25/2018   Past Medical History:  Diagnosis Date   Arthritis    left hip and knee   Atypical chest pain 10/25/2018   Cholecystitis with cholelithiasis 02/11/2018   Chronic right shoulder pain 07/04/2018   Constipation due to outlet dysfunction    Dizziness 10/25/2018   Dyslipidemia 10/25/2018   Essential hypertension 04/28/2018   Changed arb to CCB 04/24/2018 due to cough x one month trial    Hyperlipidemia    Hypertension    Hypertensive disorder 04/18/2018   Multiple pulmonary nodules determined by computed tomography of lung 01/26/2018   Passive smoke exp/ remote  Chest CT  09/24/17 MPN's  Largest 4 x 6 mm R laterally  > rec f/u 09/14/18 (reminder file)   As pt reports neg CT 2015 (not avail)  - ESR 01/25/2018 = 35  - CT chest 07/11/18 =  5 mm > no f/u needed    Primary osteoarthritis of left hip 11/26/2019   Primary osteoarthritis of left knee 03/11/2020   Rectal pain    Sinus bradycardia 10/25/2018   Unilateral primary osteoarthritis, left knee 06/14/2018   Upper airway cough syndrome 01/25/2018   Onset 1980 CT head 12/22/17 neg sinus dz FENO 01/25/2018  =   21 - Allergy profile 01/25/2018 >  Eos 0.1 /  IgE  17 RAST neg  - flare 02/12/18 p et  - gabapentin 100 mg tid 03/23/2018 > increased to max of 300 tid 04/24/2018 > improved so increased to 300 qid 06/05/2018 > d/c'd around 02/2019 s flare     Family History  Problem Relation Age of Onset   Diabetes Mother    Hypertension Mother    Thyroid disease Mother    Vascular  Disease Mother    Lung cancer Father    Colon cancer Maternal Uncle    Colon polyps Neg Hx    Esophageal cancer Neg Hx    Stomach cancer Neg Hx    Rectal cancer Neg Hx    Breast cancer Neg Hx     Past Surgical History:  Procedure Laterality Date   ABDOMINAL HYSTERECTOMY  1997   ANAL RECTAL MANOMETRY N/A 08/09/2019   Procedure: ANO RECTAL MANOMETRY;  Surgeon: Tressia Danas, MD;  Location: [facility];  Service: Gastroenterology;  Laterality: N/A;   BREAST SURGERY  2002   left  cyst removal    CHOLECYSTECTOMY N/A 02/12/2018   Procedure: LAPAROSCOPIC CHOLECYSTECTOMY;  Surgeon: Romie Levee, MD;  Location: [facility];  Service: General;  Laterality: N/A;   COLONOSCOPY  07/12/2019   POLYPECTOMY     Social History   Occupational History   Not on file  Tobacco Use   Smoking status: Never   Smokeless tobacco: Never  Vaping Use   Vaping status: Never Used  Substance and Sexual Activity   Alcohol use: Never   Drug use: Never   Sexual activity: Not Currently    Birth control/protection: Post-menopausal

## 2023-10-23 DIAGNOSIS — M6240 Contracture of muscle, unspecified site: Secondary | ICD-10-CM | POA: Diagnosis not present

## 2023-10-23 DIAGNOSIS — J019 Acute sinusitis, unspecified: Secondary | ICD-10-CM | POA: Diagnosis not present

## 2023-10-23 DIAGNOSIS — M9901 Segmental and somatic dysfunction of cervical region: Secondary | ICD-10-CM | POA: Diagnosis not present

## 2023-10-24 ENCOUNTER — Other Ambulatory Visit (INDEPENDENT_AMBULATORY_CARE_PROVIDER_SITE_OTHER): Payer: Medicare Other

## 2023-10-24 ENCOUNTER — Encounter: Payer: Self-pay | Admitting: Orthopaedic Surgery

## 2023-10-24 ENCOUNTER — Telehealth: Payer: Self-pay

## 2023-10-24 ENCOUNTER — Ambulatory Visit (INDEPENDENT_AMBULATORY_CARE_PROVIDER_SITE_OTHER): Payer: Medicare Other | Admitting: Orthopaedic Surgery

## 2023-10-24 DIAGNOSIS — M1712 Unilateral primary osteoarthritis, left knee: Secondary | ICD-10-CM | POA: Diagnosis not present

## 2023-10-24 NOTE — Telephone Encounter (Signed)
Please precert for left knee monovisc injection. Dr.Xu's patient. Thanks!

## 2023-11-01 DIAGNOSIS — I83892 Varicose veins of left lower extremities with other complications: Secondary | ICD-10-CM | POA: Diagnosis not present

## 2023-11-03 DIAGNOSIS — M9901 Segmental and somatic dysfunction of cervical region: Secondary | ICD-10-CM | POA: Diagnosis not present

## 2023-11-03 DIAGNOSIS — M6240 Contracture of muscle, unspecified site: Secondary | ICD-10-CM | POA: Diagnosis not present

## 2023-11-03 DIAGNOSIS — J019 Acute sinusitis, unspecified: Secondary | ICD-10-CM | POA: Diagnosis not present

## 2023-11-08 DIAGNOSIS — Z09 Encounter for follow-up examination after completed treatment for conditions other than malignant neoplasm: Secondary | ICD-10-CM | POA: Diagnosis not present

## 2023-11-08 DIAGNOSIS — I83892 Varicose veins of left lower extremities with other complications: Secondary | ICD-10-CM | POA: Diagnosis not present

## 2023-11-09 NOTE — Telephone Encounter (Signed)
VOB submitted for Monovisc, left knee

## 2023-11-13 DIAGNOSIS — M170 Bilateral primary osteoarthritis of knee: Secondary | ICD-10-CM | POA: Diagnosis not present

## 2023-11-21 DIAGNOSIS — J019 Acute sinusitis, unspecified: Secondary | ICD-10-CM | POA: Diagnosis not present

## 2023-11-21 DIAGNOSIS — M6240 Contracture of muscle, unspecified site: Secondary | ICD-10-CM | POA: Diagnosis not present

## 2023-11-21 DIAGNOSIS — M9907 Segmental and somatic dysfunction of upper extremity: Secondary | ICD-10-CM | POA: Diagnosis not present

## 2023-11-21 DIAGNOSIS — M9901 Segmental and somatic dysfunction of cervical region: Secondary | ICD-10-CM | POA: Diagnosis not present

## 2023-11-23 ENCOUNTER — Ambulatory Visit: Payer: Medicare Other | Admitting: Family Medicine

## 2023-11-27 ENCOUNTER — Ambulatory Visit: Payer: Medicare Other | Admitting: Family Medicine

## 2023-11-28 ENCOUNTER — Other Ambulatory Visit: Payer: Self-pay

## 2023-11-28 ENCOUNTER — Telehealth: Payer: Self-pay | Admitting: Orthopaedic Surgery

## 2023-11-28 DIAGNOSIS — M1712 Unilateral primary osteoarthritis, left knee: Secondary | ICD-10-CM

## 2023-11-28 NOTE — Telephone Encounter (Signed)
 Patient calling requesting the status of the gel injection approval. Please call ASAP

## 2023-11-28 NOTE — Telephone Encounter (Signed)
Please submit for gel inj ?

## 2023-11-28 NOTE — Telephone Encounter (Signed)
 Talked with patient and appointment has been scheduled for gel injection.

## 2023-11-29 DIAGNOSIS — M9901 Segmental and somatic dysfunction of cervical region: Secondary | ICD-10-CM | POA: Diagnosis not present

## 2023-11-29 DIAGNOSIS — J019 Acute sinusitis, unspecified: Secondary | ICD-10-CM | POA: Diagnosis not present

## 2023-11-29 DIAGNOSIS — M9907 Segmental and somatic dysfunction of upper extremity: Secondary | ICD-10-CM | POA: Diagnosis not present

## 2023-11-29 DIAGNOSIS — M6240 Contracture of muscle, unspecified site: Secondary | ICD-10-CM | POA: Diagnosis not present

## 2023-11-30 ENCOUNTER — Encounter: Payer: Self-pay | Admitting: Family Medicine

## 2023-11-30 ENCOUNTER — Ambulatory Visit: Payer: Medicare Other | Admitting: Family Medicine

## 2023-11-30 VITALS — BP 160/92 | HR 64 | Temp 98.2°F | Ht 65.0 in | Wt 220.2 lb

## 2023-11-30 DIAGNOSIS — I10 Essential (primary) hypertension: Secondary | ICD-10-CM

## 2023-11-30 DIAGNOSIS — E559 Vitamin D deficiency, unspecified: Secondary | ICD-10-CM

## 2023-11-30 DIAGNOSIS — Z1159 Encounter for screening for other viral diseases: Secondary | ICD-10-CM | POA: Diagnosis not present

## 2023-11-30 DIAGNOSIS — E538 Deficiency of other specified B group vitamins: Secondary | ICD-10-CM

## 2023-11-30 DIAGNOSIS — N1832 Chronic kidney disease, stage 3b: Secondary | ICD-10-CM

## 2023-11-30 LAB — BASIC METABOLIC PANEL
BUN: 17 mg/dL (ref 6–23)
CO2: 28 meq/L (ref 19–32)
Calcium: 9.6 mg/dL (ref 8.4–10.5)
Chloride: 102 meq/L (ref 96–112)
Creatinine, Ser: 1.03 mg/dL (ref 0.40–1.20)
GFR: 53.92 mL/min — ABNORMAL LOW (ref >60.00)
Glucose, Bld: 87 mg/dL (ref 70–99)
Potassium: 3.9 meq/L (ref 3.5–5.1)
Sodium: 136 meq/L (ref 135–145)

## 2023-11-30 LAB — VITAMIN B12: Vitamin B-12: 229 pg/mL (ref 211–911)

## 2023-11-30 LAB — VITAMIN D 25 HYDROXY (VIT D DEFICIENCY, FRACTURES): VITD: 25.99 ng/mL — ABNORMAL LOW (ref 30.00–100.00)

## 2023-11-30 MED ORDER — IRBESARTAN 150 MG PO TABS: 150.0000 mg | ORAL_TABLET | Freq: Every day | ORAL | 1 refills | Status: DC

## 2023-11-30 NOTE — Progress Notes (Signed)
 Established Patient Office Visit   Subjective  Patient ID: Nancy Christensen, female    DOB: [birth year]  Age: 73 y.o. MRN: [id number]  Chief Complaint  Patient presents with   Medical Management of Chronic Issues    5 month follow-up for BP and sleep study, patient hasn't heard from the sleep study, BP at home yesterday morning was 152/80    Patient is a 73 year old female seen for follow-up.  Patient endorses BP slightly improving since taking irbesartan 112.5 mg daily.  Irbesartan dose slowly increased due to patient's history of sensitivity to medications.  BP at home yesterday 152/80.  Patient denies symptoms of elevated BP.  Did have headache last week which resolved with medication.  Endorses increased stress which may be contributing. Not exercising as frequently as she was before.  Seen by Ortho for left knee pain due to "bone-on-bone" OA.  Patient to have gel injection.  Last gel injection lasted for years.    Patient Active Problem List   Diagnosis Date Noted   Vitamin D deficiency 06/27/2023   Puncture wound of toe of left foot 05/06/2022   Hypertension    Hyperlipidemia    Arthritis    Primary osteoarthritis of left knee 03/11/2020   Primary osteoarthritis of left hip 11/26/2019   Constipation due to outlet dysfunction    Rectal pain    Dizziness 10/25/2018   Sinus bradycardia 10/25/2018   Atypical chest pain 10/25/2018   Dyslipidemia 10/25/2018   Chronic right shoulder pain 07/04/2018   Essential hypertension 04/28/2018   Hypertensive disorder 04/18/2018   Cholecystitis with cholelithiasis 02/11/2018   Multiple pulmonary nodules determined by computed tomography of lung 01/26/2018   Upper airway cough syndrome 01/25/2018   Past Medical History:  Diagnosis Date   Arthritis    left hip and knee   Atypical chest pain 10/25/2018   Cholecystitis with cholelithiasis 02/11/2018   Chronic right shoulder pain 07/04/2018   Constipation due to outlet dysfunction     Dizziness 10/25/2018   Dyslipidemia 10/25/2018   Essential hypertension 04/28/2018   Changed arb to CCB 04/24/2018 due to cough x one month trial    Hyperlipidemia    Hypertension    Hypertensive disorder 04/18/2018   Multiple pulmonary nodules determined by computed tomography of lung 01/26/2018   Passive smoke exp/ remote  Chest CT  09/24/17 MPN's  Largest 4 x 6 mm R laterally  > rec f/u 09/14/18 (reminder file)   As pt reports neg CT 2015 (not avail)  - ESR 01/25/2018 = 35  - CT chest 07/11/18 =  5 mm > no f/u needed    Primary osteoarthritis of left hip 11/26/2019   Primary osteoarthritis of left knee 03/11/2020   Rectal pain    Sinus bradycardia 10/25/2018   Unilateral primary osteoarthritis, left knee 06/14/2018   Upper airway cough syndrome 01/25/2018   Onset 1980 CT head 12/22/17 neg sinus dz FENO 01/25/2018  =   21 - Allergy profile 01/25/2018 >  Eos 0.1 /  IgE  17 RAST neg  - flare 02/12/18 p et  - gabapentin 100 mg tid 03/23/2018 > increased to max of 300 tid 04/24/2018 > improved so increased to 300 qid 06/05/2018 > d/c'd around 02/2019 s flare    Past Surgical History:  Procedure Laterality Date   ABDOMINAL HYSTERECTOMY  1997   ANAL RECTAL MANOMETRY N/A 08/09/2019   Procedure: ANO RECTAL MANOMETRY;  Surgeon: Tressia Danas, MD;  Location: [facility];  Service: Gastroenterology;  Laterality: N/A;   BREAST SURGERY  2002   left cyst removal    CHOLECYSTECTOMY N/A 02/12/2018   Procedure: LAPAROSCOPIC CHOLECYSTECTOMY;  Surgeon: Romie Levee, MD;  Location: [facility];  Service: General;  Laterality: N/A;   COLONOSCOPY  07/12/2019   POLYPECTOMY     Social History   Tobacco Use   Smoking status: Never   Smokeless tobacco: Never  Vaping Use   Vaping status: Never Used  Substance Use Topics   Alcohol use: Never   Drug use: Never   Family History  Problem Relation Age of Onset   Diabetes Mother    Hypertension Mother    Thyroid disease Mother    Vascular Disease Mother    Lung cancer Father     Colon cancer Maternal Uncle    Colon polyps Neg Hx    Esophageal cancer Neg Hx    Stomach cancer Neg Hx    Rectal cancer Neg Hx    Breast cancer Neg Hx    No Known Allergies    ROS Negative unless stated above    Objective:     BP (!) 160/92 (BP Location: Left Arm, Patient Position: Sitting, Cuff Size: Normal)   Pulse 64   Temp 98.2 F (36.8 C) (Oral)   Ht 5\' 5"  (1.651 m)   Wt 220 lb 3.2 oz (99.9 kg)   SpO2 96%   BMI 36.64 kg/m  BP Readings from Last 3 Encounters:  11/30/23 (!) 160/92  10/19/23 (!) 162/94  08/31/23 (!) 170/100   Wt Readings from Last 3 Encounters:  11/30/23 220 lb 3.2 oz (99.9 kg)  10/19/23 219 lb 3.2 oz (99.4 kg)  08/31/23 217 lb (98.4 kg)      Physical Exam Constitutional:      General: She is not in acute distress.    Appearance: Normal appearance.  HENT:     Head: Normocephalic and atraumatic.     Nose: Nose normal.     Mouth/Throat:     Mouth: Mucous membranes are moist.  Cardiovascular:     Rate and Rhythm: Normal rate and regular rhythm.     Heart sounds: Normal heart sounds. No murmur heard.    No gallop.  Pulmonary:     Effort: Pulmonary effort is normal. No respiratory distress.     Breath sounds: Normal breath sounds. No wheezing, rhonchi or rales.  Skin:    General: Skin is warm and dry.  Neurological:     Mental Status: She is alert and oriented to person, place, and time.     No results found for any visits on 11/30/23.    Assessment & Plan:  Essential hypertension -     Ambulatory referral to Sleep Studies -     Basic metabolic panel -     Irbesartan; Take 1 tablet (150 mg total) by mouth daily.  Dispense: 90 tablet; Refill: 1  Stage 3b chronic kidney disease (HCC) -     Basic metabolic panel  Encounter for hepatitis C screening test for low risk patient -     Hepatitis C antibody  Vitamin D deficiency -     VITAMIN D 25 Hydroxy (Vit-D Deficiency, Fractures)  Vitamin B12 deficiency -     Vitamin  B12  BP uncontrolled.  Recheck remained elevated.  Will increase irbesartan from 112.5 mg to 150 mg daily.  Patient advised will likely need further dose adjustment versus addition of new medication to current regimen.  Going slow and adjustments to BP  meds due to sensitivities to medications in the past.  Continue lifestyle modifications.  Self-care encouraged.  Continue monitoring BP at home for elevations consistently greater than 140/90 notify clinic.  Follow-up in 1 month.  Recheck labs.  Return in about 4 weeks (around 12/28/2023) for blood pressure.   Deeann Saint, MD

## 2023-11-30 NOTE — Patient Instructions (Signed)
 Today we increased her irbesartan to 150 mg.  Continue monitoring her blood pressure and keeping a log to bring to clinic.  Notify clinic for elevations consistently greater than 140/90.  We will have you follow-up in a little sooner to monitor blood pressure.

## 2023-12-01 LAB — HEPATITIS C ANTIBODY: Hepatitis C Ab: NONREACTIVE

## 2023-12-04 ENCOUNTER — Emergency Department (HOSPITAL_COMMUNITY)
Admission: EM | Admit: 2023-12-04 | Discharge: 2023-12-04 | Disposition: A | Discharge status: Home / Self Care | Attending: Emergency Medicine | Admitting: Emergency Medicine

## 2023-12-04 ENCOUNTER — Encounter: Payer: Self-pay | Admitting: Family Medicine

## 2023-12-04 DIAGNOSIS — R040 Epistaxis: Secondary | ICD-10-CM | POA: Diagnosis not present

## 2023-12-04 DIAGNOSIS — I10 Essential (primary) hypertension: Secondary | ICD-10-CM | POA: Insufficient documentation

## 2023-12-04 DIAGNOSIS — R519 Headache, unspecified: Secondary | ICD-10-CM | POA: Diagnosis not present

## 2023-12-04 DIAGNOSIS — Z79899 Other long term (current) drug therapy: Secondary | ICD-10-CM | POA: Insufficient documentation

## 2023-12-04 MED ORDER — IBUPROFEN 200 MG PO TABS
400.0000 mg | ORAL_TABLET | Freq: Once | ORAL | Status: AC
Start: 2023-12-04 — End: 2023-12-04
  Administered 2023-12-04: 400 mg via ORAL
  Filled 2023-12-04: qty 2

## 2023-12-04 NOTE — ED Provider Notes (Signed)
 [place] EMERGENCY DEPARTMENT AT [facility] Provider Note   CSN: [id number] Arrival date & time: 12/04/23  1030     History  Chief Complaint  Patient presents with   Epistaxis    Nancy Christensen is a 73 y.o. female.   Epistaxis  Patient is a 73 year old female presents the ED today complaining of epistaxis from right nare that started yesterday and stopped yesterday beginning again this a.m.  Reportedly had this 1 year ago which she had follow-up with ENT to have packing removed and was provided moisturizing gel for the nose.  Previous medical history of HTN, HLD, arthritis.   Patient is at the bleeding has since stopped since being in the ER.  Endorses mild headache with left-sided trapezius tenderness to palpation/soreness.  States this is present today.  It is helped with Tylenol and ibuprofen.  Denies vision changes, vertigo, hearing loss, sore throat, weakness, chest pain, shortness breath, abdominal pain, nausea, vomiting, lower extremity swelling    Home Medications Prior to Admission medications   Medication Sig Start Date End Date Taking? Authorizing Provider  irbesartan (AVAPRO) 150 MG tablet Take 1 tablet (150 mg total) by mouth daily. 11/30/23   Deeann Saint, MD      Allergies    Patient has no known allergies.    Review of Systems   Review of Systems  HENT:  Positive for nosebleeds.   All other systems reviewed and are negative.   Physical Exam Updated Vital Signs BP (!) 161/91 (BP Location: Left Arm)   Pulse 82   Temp 97.6 F (36.4 C) (Axillary)   Resp 16   SpO2 100%  Physical Exam Vitals and nursing note reviewed.  Constitutional:      General: She is not in acute distress.    Appearance: Normal appearance. She is not ill-appearing.  HENT:     Head: Normocephalic and atraumatic.     Nose:     Comments: Patient is noted to have had dried blood in right nare.  Left nare is clear.  Right nare does show signs of anterior nosebleed  and is currently patent with no signs of a nasal septal hematoma or active bleed at this time.  No sign of trauma to the area.    Mouth/Throat:     Mouth: Mucous membranes are moist.     Pharynx: Oropharynx is clear. No oropharyngeal exudate or posterior oropharyngeal erythema.  Eyes:     General: No scleral icterus.       Right eye: No discharge.        Left eye: No discharge.     Extraocular Movements: Extraocular movements intact.     Conjunctiva/sclera: Conjunctivae normal.  Cardiovascular:     Rate and Rhythm: Normal rate and regular rhythm.     Pulses: Normal pulses.     Heart sounds: Normal heart sounds. No murmur heard.    No friction rub. No gallop.  Pulmonary:     Effort: Pulmonary effort is normal. No respiratory distress.     Breath sounds: Normal breath sounds. No stridor. No wheezing or rales.  Abdominal:     General: Abdomen is flat.     Palpations: Abdomen is soft.     Tenderness: There is no abdominal tenderness.  Musculoskeletal:        General: No tenderness or deformity.     Cervical back: Normal range of motion and neck supple. No rigidity or tenderness.     Right lower leg:  No edema.     Left lower leg: No edema.  Skin:    General: Skin is warm and dry.     Coloration: Skin is not pale.     Findings: No bruising or erythema.  Neurological:     General: No focal deficit present.     Mental Status: She is alert. Mental status is at baseline.  Psychiatric:        Mood and Affect: Mood normal.     ED Results / Procedures / Treatments   Labs (all labs ordered are listed, but only abnormal results are displayed) Labs Reviewed - No data to display  EKG None  Radiology No results found.  Procedures Procedures   Medications Ordered in ED Medications  ibuprofen (ADVIL) tablet 400 mg (400 mg Oral Given 12/04/23 1258)    ED Course/ Medical Decision Making/ A&P                                 Medical Decision Making  Patient is a 73 year old  female presents the ED today complaining of epistaxis from right nare that started yesterday and stopped yesterday beginning again this a.m.  Reportedly had this 1 year ago which she had follow-up with ENT to have packing removed and was provided moisturizing gel for the nose.  Previous medical history of HTN, HLD, arthritis.  Not on blood thinners.  Patient is at the bleeding has since stopped since being in the ER.  Endorses mild headache with left-sided trapezius tenderness to palpation/soreness.  States this is present today.  It is helped with Tylenol and ibuprofen.  On physical exam, patient is noted to have dried blood around right nare.  No active bleeding at this time.  No blood in the oropharynx and no sign of infection.  No septal hematoma noted.  Appears to be in anterior bleed.  Afebrile, no acute distress, alert oriented x 3, speaking in full sentences.  Exam is otherwise unremarkable.  Due to bleeding having already stopped, no need for further treatment.  Provided strict return to ED precautions as well as provided instructions if bleeding were to restart, including over-the-counter Afrin and direct pressure.  Recommend that she return to ED if needed of these work as well as will schedule a follow-up visit with ENT for further evaluation for these nosebleeds.  I have low suspicion for any other emergent pathology or cause at this time.  No need for lab work or imaging as patient's bleeding is well-controlled and no signs of systemic anemia or other emergent process requiring further workup.  Believe that this headache is most likely due to tension headache due to mild left trapezius soreness.  Will have her continue to take ibuprofen with relief as well as add Tylenol for additional effect.  I believe this patient is safe to discharge at this time and followed up in outpatient setting.  Differential diagnoses prior to evaluation: Epistaxis, trauma, fracture, symptomatic anemia, tumor, nasal  septal hematoma, sinus infection  Past Medical History / Social History / Additional history: Chart reviewed. Pertinent results include: HTN, HLD, arthritis  Medications / Treatment: Ibuprofen was provided for headache as she did state this helps the headache when it is present.  As patient bleeding had already stopped upon evaluation.   Disposition: After consideration of the diagnostic results and the patients response to treatment, I feel that the patient benefit from discharge and treatment as above.  emergency department workup does not suggest an emergent condition requiring admission or immediate intervention beyond what has been performed at this time. The plan is: Symptomatic management for headache and nosebleed at home.  Use Afrin and direct pressure for recurrent bleeding, follow-up with ENT, return for any new or worsening symptoms, follow-up with PCP for blood pressure control.. The patient is safe for discharge and has been instructed to return immediately for worsening symptoms, change in symptoms or any other concerns.  Final Clinical Impression(s) / ED Diagnoses Final diagnoses:  Epistaxis    Rx / DC Orders ED Discharge Orders     None         Lavonia Drafts 12/04/23 1258    Wynetta Fines, MD 12/04/23 1821

## 2023-12-04 NOTE — ED Triage Notes (Signed)
 Pt states that yesterday she had a slight nose bleed, but then today when she was leaving to go to work he had profuse bleeding from R side of her nose. Pt states that she is not on blood thinners. She reports passing several very large clots from her nose. NAD noted during triage.

## 2023-12-04 NOTE — Discharge Instructions (Addendum)
 You were seen today for nosebleed on the right side.  Due to the bleed having stopped today, I do not believe that any further medication is needed at this time or packing is needed at this time.  Recommend that you continue to monitor symptoms and follow-up with ENT.  I have attached an ENT for which you are to call and schedule appointment if problem persists.  Recommend that you follow-up with your PCP to evaluate for any additional blood pressure medications.   If bleeding recurs and is unable to be stopped at home, return to the ED for further evaluation as you may need packing at that time.  You can also use Afrin over-the-counter to assist in helping stop the nosebleed.

## 2023-12-05 ENCOUNTER — Encounter (INDEPENDENT_AMBULATORY_CARE_PROVIDER_SITE_OTHER): Payer: Self-pay

## 2023-12-05 ENCOUNTER — Telehealth: Payer: Self-pay

## 2023-12-05 ENCOUNTER — Ambulatory Visit (INDEPENDENT_AMBULATORY_CARE_PROVIDER_SITE_OTHER)

## 2023-12-05 VITALS — BP 168/93 | HR 52 | Ht 65.0 in | Wt 218.0 lb

## 2023-12-05 DIAGNOSIS — R040 Epistaxis: Secondary | ICD-10-CM

## 2023-12-05 NOTE — Telephone Encounter (Signed)
 Returned patients call.  Today she was recently cauterized by Dr. Suszanne Conners. She is complaining of having clear mucus drainage. Advised her that was normal and should she start to bleed again, to hold the tip of her nose where the cartridge is for 10 mins.  If is is severally bleeding to go to the ED. Patient understood and agreed with plan.

## 2023-12-06 ENCOUNTER — Encounter: Payer: Self-pay | Admitting: Family Medicine

## 2023-12-06 ENCOUNTER — Other Ambulatory Visit: Payer: Self-pay | Admitting: Family Medicine

## 2023-12-06 ENCOUNTER — Ambulatory Visit (INDEPENDENT_AMBULATORY_CARE_PROVIDER_SITE_OTHER): Admitting: Orthopaedic Surgery

## 2023-12-06 DIAGNOSIS — E559 Vitamin D deficiency, unspecified: Secondary | ICD-10-CM

## 2023-12-06 DIAGNOSIS — G4733 Obstructive sleep apnea (adult) (pediatric): Secondary | ICD-10-CM | POA: Diagnosis not present

## 2023-12-06 DIAGNOSIS — R0602 Shortness of breath: Secondary | ICD-10-CM | POA: Diagnosis not present

## 2023-12-06 DIAGNOSIS — M1712 Unilateral primary osteoarthritis, left knee: Secondary | ICD-10-CM

## 2023-12-06 DIAGNOSIS — R040 Epistaxis: Secondary | ICD-10-CM | POA: Insufficient documentation

## 2023-12-06 MED ORDER — HYALURONAN 88 MG/4ML IX SOSY
88.0000 mg | PREFILLED_SYRINGE | INTRA_ARTICULAR | Status: AC | PRN
Start: 2023-12-06 — End: 2023-12-06
  Administered 2023-12-06: 88 mg via INTRA_ARTICULAR

## 2023-12-06 MED ORDER — VITAMIN D (ERGOCALCIFEROL) 1.25 MG (50000 UNIT) PO CAPS: 50000.0000 [IU] | ORAL_CAPSULE | ORAL | 0 refills | Status: DC

## 2023-12-06 NOTE — Progress Notes (Signed)
 Patient ID: Nancy Christensen, female   DOB: 17-Apr-1951, 73 y.o.   MRN: [id number]  CC: Recurrent right epistaxis  HPI:  Nancy Christensen is a 73 y.o. female who presents today complaining of recurrent right-sided epistaxis for the past 2 years.  Over the past week (on Sunday and Monday), she had 2 episodes of significant right-sided bleeding.  She denies any recent nasal trauma.  She is not on any blood thinner.  She has no previous ENT surgery.  She is able to breathe through her nostrils.  Past Medical History:  Diagnosis Date   Arthritis    left hip and knee   Atypical chest pain 10/25/2018   Cholecystitis with cholelithiasis 02/11/2018   Chronic right shoulder pain 07/04/2018   Constipation due to outlet dysfunction    Dizziness 10/25/2018   Dyslipidemia 10/25/2018   Essential hypertension 04/28/2018   Changed arb to CCB 04/24/2018 due to cough x one month trial    Hyperlipidemia    Hypertension    Hypertensive disorder 04/18/2018   Multiple pulmonary nodules determined by computed tomography of lung 01/26/2018   Passive smoke exp/ remote  Chest CT  09/24/17 MPN's  Largest 4 x 6 mm R laterally  > rec f/u 09/14/18 (reminder file)   As pt reports neg CT 2015 (not avail)  - ESR 01/25/2018 = 35  - CT chest 07/11/18 =  5 mm > no f/u needed    Primary osteoarthritis of left hip 11/26/2019   Primary osteoarthritis of left knee 03/11/2020   Rectal pain    Sinus bradycardia 10/25/2018   Unilateral primary osteoarthritis, left knee 06/14/2018   Upper airway cough syndrome 01/25/2018   Onset 1980 CT head 12/22/17 neg sinus dz FENO 01/25/2018  =   21 - Allergy profile 01/25/2018 >  Eos 0.1 /  IgE  17 RAST neg  - flare 02/12/18 p et  - gabapentin 100 mg tid 03/23/2018 > increased to max of 300 tid 04/24/2018 > improved so increased to 300 qid 06/05/2018 > d/c'd around 02/2019 s flare     Past Surgical History:  Procedure Laterality Date   ABDOMINAL HYSTERECTOMY  1997   ANAL RECTAL MANOMETRY N/A 08/09/2019   Procedure:  ANO RECTAL MANOMETRY;  Surgeon: Tressia Danas, MD;  Location: [facility];  Service: Gastroenterology;  Laterality: N/A;   BREAST SURGERY  2002   left cyst removal    CHOLECYSTECTOMY N/A 02/12/2018   Procedure: LAPAROSCOPIC CHOLECYSTECTOMY;  Surgeon: Romie Levee, MD;  Location: [facility];  Service: General;  Laterality: N/A;   COLONOSCOPY  07/12/2019   POLYPECTOMY      Family History  Problem Relation Age of Onset   Diabetes Mother    Hypertension Mother    Thyroid disease Mother    Vascular Disease Mother    Lung cancer Father    Colon cancer Maternal Uncle    Colon polyps Neg Hx    Esophageal cancer Neg Hx    Stomach cancer Neg Hx    Rectal cancer Neg Hx    Breast cancer Neg Hx     Social History:  reports that she has never smoked. She has never used smokeless tobacco. She reports that she does not drink alcohol and does not use drugs.  Allergies: No Known Allergies  Prior to Admission medications   Medication Sig Start Date End Date Taking? Authorizing Provider  irbesartan (AVAPRO) 150 MG tablet Take 1 tablet (150 mg total) by mouth daily. 11/30/23  Yes Banks,  Bettey Mare, MD    Blood pressure (!) 168/93, pulse (!) 52, height 5\' 5"  (1.651 m), weight 218 lb (98.9 kg), SpO2 97%. Exam: General: Communicates without difficulty, well nourished, no acute distress. Head: Normocephalic, no evidence injury, no tenderness, facial buttresses intact without stepoff. Face/sinus: No tenderness to palpation and percussion. Facial movement is normal and symmetric. Eyes: PERRL, EOMI. No scleral icterus, conjunctivae clear. Neuro: CN II exam reveals vision grossly intact.  No nystagmus at any point of gaze. Ears: Auricles well formed without lesions.  Ear canals are intact without mass or lesion.  No erythema or edema is appreciated.  The TMs are intact without fluid. Nose: External evaluation reveals normal support and skin without lesions.  Dorsum is intact.  Anterior rhinoscopy reveals  hypervascular areas on the right nasal septum.  Oral:  Oral cavity and oropharynx are intact, symmetric, without erythema or edema.  Mucosa is moist without lesions. Neck: Full range of motion without pain.  There is no significant lymphadenopathy.  No masses palpable.  Thyroid bed within normal limits to palpation.  Parotid glands and submandibular glands equal bilaterally without mass.  Trachea is midline. Neuro:  CN 2-12 grossly intact.   Procedure:  Endoscopic control of recurrent right epistaxis. Indication:  Recurrent epistaxis  Description:  The right nasal cavity is sprayed with topical xylocaine and neo-synephrine.  After adequate anesthesia is achieved, the nasal cavity is examined with a 0 rigid endoscope.  A suction catheter is inserted into parallel with the 0 endoscope, and it is used to suction blood clots from the nasal cavity.  Several hypervascular areas are noted on the anterior and superior portion of the septum. Active bleeding is noted. A silver nitrate stick is inserted in parallel with the 0 endoscope.  It is used to repeatedly cauterized the hypervascular areas.  Good hemostasis is achieved.  The patient tolerated the procedure well.    Assessment: 1.  Recurrent right epistaxis.  Hypervascular areas are noted on the right anterior and superior nasal septum. 2.  No suspicious mass, lesion, or infection is noted today.  Plan: 1.  The physical exam and nasal endoscopy findings are reviewed with the patient. 2.  Endoscopic cauterization of the right nasal septum. 3.  Humidifier and nasal ointment during the winter months. 4.  The patient will return for reevaluation in 1 month.  Nancy Christensen 12/06/2023, 11:51 AM

## 2023-12-06 NOTE — Progress Notes (Signed)
   Procedure Note  Patient: Nancy Christensen             Date of Birth: [birth year]           MRN: [id number]             Visit Date: 12/06/2023  Procedures: Visit Diagnoses:  1. Primary osteoarthritis of left knee     Large Joint Inj: L knee on 12/06/2023 9:12 AM Indications: pain Details: 22 G needle  Arthrogram: No  Medications: 88 mg Hyaluronan 88 MG/4ML Outcome: tolerated well, no immediate complications Patient was prepped and draped in the usual sterile fashion.      Left knee injected with monovisc.

## 2023-12-07 ENCOUNTER — Telehealth: Payer: Self-pay

## 2023-12-07 DIAGNOSIS — R0602 Shortness of breath: Secondary | ICD-10-CM | POA: Diagnosis not present

## 2023-12-07 DIAGNOSIS — G4733 Obstructive sleep apnea (adult) (pediatric): Secondary | ICD-10-CM | POA: Diagnosis not present

## 2023-12-07 NOTE — Telephone Encounter (Signed)
 Patient called stating that her left knee is stiff today.  Patient received gel injection on 12/06/2023.  Would like a call back to discuss.  CB# [phone].  Please advise.  Thank you.

## 2023-12-08 NOTE — Telephone Encounter (Signed)
 Spoke with patient. It should resolve in a few days. Encouraged her to ice, elevate, and rest as needed. I ask her to call us back if it does not improve or gets worse.

## 2023-12-08 NOTE — Telephone Encounter (Signed)
 Message regarding nosebleeds and elevated BP addressed via another message thread and seen by pt.

## 2023-12-13 ENCOUNTER — Telehealth: Payer: Self-pay | Admitting: Family Medicine

## 2023-12-13 NOTE — Telephone Encounter (Signed)
 Pt rep dropped form off

## 2023-12-13 NOTE — Telephone Encounter (Signed)
 Patient dropped off document  sleep therapy order form , to be filled out by provider. Patient requested to send it back via Fax within 7-days. Document is located in providers tray at front office.Please advise at Mobile [phone] (mobile)

## 2023-12-20 ENCOUNTER — Ambulatory Visit (INDEPENDENT_AMBULATORY_CARE_PROVIDER_SITE_OTHER): Admitting: Podiatry

## 2023-12-20 DIAGNOSIS — M216X1 Other acquired deformities of right foot: Secondary | ICD-10-CM | POA: Diagnosis not present

## 2023-12-20 DIAGNOSIS — M7751 Other enthesopathy of right foot: Secondary | ICD-10-CM

## 2023-12-20 DIAGNOSIS — M170 Bilateral primary osteoarthritis of knee: Secondary | ICD-10-CM | POA: Diagnosis not present

## 2023-12-20 NOTE — Progress Notes (Signed)
 Subjective:  Patient ID: Nancy Christensen, female    DOB: Apr 17, 1951,  MRN: [id number]  Chief Complaint  Patient presents with   Toe Pain    Right foot 4th toe pain, pt stated that she feels like it rubs in her shoe she stated that at times it does tingle.    73 y.o. female presents with the above complaint.  Patient presents for right third metatarsophalangeal joint capsulitis with underlying plantar fat pad atrophy.  Patient states painful to touch she wears flatter shoes she started noticing recently has been hurting for quite some time wanted to discuss treatment options for it.  She has not seen MRIs prior to seeing me for this pain scale is 5 out of 10 dull aching nature   Review of Systems: Negative except as noted in the HPI. Denies N/V/F/Ch.  Past Medical History:  Diagnosis Date   Arthritis    left hip and knee   Atypical chest pain 10/25/2018   Cholecystitis with cholelithiasis 02/11/2018   Chronic right shoulder pain 07/04/2018   Constipation due to outlet dysfunction    Dizziness 10/25/2018   Dyslipidemia 10/25/2018   Essential hypertension 04/28/2018   Changed arb to CCB 04/24/2018 due to cough x one month trial    Hyperlipidemia    Hypertension    Hypertensive disorder 04/18/2018   Multiple pulmonary nodules determined by computed tomography of lung 01/26/2018   Passive smoke exp/ remote  Chest CT  09/24/17 MPN's  Largest 4 x 6 mm R laterally  > rec f/u 09/14/18 (reminder file)   As pt reports neg CT 2015 (not avail)  - ESR 01/25/2018 = 35  - CT chest 07/11/18 =  5 mm > no f/u needed    Primary osteoarthritis of left hip 11/26/2019   Primary osteoarthritis of left knee 03/11/2020   Rectal pain    Sinus bradycardia 10/25/2018   Unilateral primary osteoarthritis, left knee 06/14/2018   Upper airway cough syndrome 01/25/2018   Onset 1980 CT head 12/22/17 neg sinus dz FENO 01/25/2018  =   21 - Allergy profile 01/25/2018 >  Eos 0.1 /  IgE  17 RAST neg  - flare 02/12/18 p et  - gabapentin 100 mg  tid 03/23/2018 > increased to max of 300 tid 04/24/2018 > improved so increased to 300 qid 06/05/2018 > d/c'd around 02/2019 s flare     Current Outpatient Medications:    irbesartan (AVAPRO) 150 MG tablet, Take 1 tablet (150 mg total) by mouth daily., Disp: 90 tablet, Rfl: 1   Vitamin D, Ergocalciferol, (DRISDOL) 1.25 MG (50000 UNIT) CAPS capsule, Take 1 capsule (50,000 Units total) by mouth every 7 (seven) days., Disp: 12 capsule, Rfl: 0  Social History   Tobacco Use  Smoking Status Never  Smokeless Tobacco Never    No Known Allergies Objective:  There were no vitals filed for this visit. There is no height or weight on file to calculate BMI. Constitutional Well developed. Well nourished.  Vascular Dorsalis pedis pulses palpable bilaterally. Posterior tibial pulses palpable bilaterally. Capillary refill normal to all digits.  No cyanosis or clubbing noted. Pedal hair growth normal.  Neurologic Normal speech. Oriented to person, place, and time. Epicritic sensation to light touch grossly present bilaterally.  Dermatologic Nails well groomed and normal in appearance. No open wounds. No skin lesions.  Orthopedic: Pain on palpation right third metatarsophalangeal joint pain with range of motion of the joint no acute intra-articular pain noted.   Radiographs: None Assessment:  1. Capsulitis of metatarsophalangeal (MTP) joint of right foot   2. 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  Right third MTP capsulitis with underlying plantar fat pad atrophy -All questions and concerns were discussed with the patient in extensive detail given the pain that she is unsure benefit from steroid injection help decrease inflammatory component associate with pain.  Patient agrees with plan to proceed with steroid injection -Discussed shoe gear modification -She will also benefit from metatarsal pads as well  No follow-ups  on file.

## 2023-12-21 DIAGNOSIS — M1710 Unilateral primary osteoarthritis, unspecified knee: Secondary | ICD-10-CM | POA: Diagnosis not present

## 2023-12-21 DIAGNOSIS — M25562 Pain in left knee: Secondary | ICD-10-CM | POA: Diagnosis not present

## 2023-12-27 NOTE — Telephone Encounter (Signed)
 Spoke with patient, Dr. Salomon Fick has seen sleep test results and the order has been sent over to [facility] for CPAP

## 2023-12-28 DIAGNOSIS — M6240 Contracture of muscle, unspecified site: Secondary | ICD-10-CM | POA: Diagnosis not present

## 2023-12-28 DIAGNOSIS — M9901 Segmental and somatic dysfunction of cervical region: Secondary | ICD-10-CM | POA: Diagnosis not present

## 2023-12-28 DIAGNOSIS — M9907 Segmental and somatic dysfunction of upper extremity: Secondary | ICD-10-CM | POA: Diagnosis not present

## 2023-12-28 DIAGNOSIS — J019 Acute sinusitis, unspecified: Secondary | ICD-10-CM | POA: Diagnosis not present

## 2024-01-01 ENCOUNTER — Ambulatory Visit (INDEPENDENT_AMBULATORY_CARE_PROVIDER_SITE_OTHER): Admitting: Family Medicine

## 2024-01-01 ENCOUNTER — Encounter: Payer: Self-pay | Admitting: Family Medicine

## 2024-01-01 VITALS — BP 174/98 | HR 66 | Temp 98.0°F | Wt 222.8 lb

## 2024-01-01 DIAGNOSIS — G4733 Obstructive sleep apnea (adult) (pediatric): Secondary | ICD-10-CM

## 2024-01-01 DIAGNOSIS — E559 Vitamin D deficiency, unspecified: Secondary | ICD-10-CM

## 2024-01-01 DIAGNOSIS — I10 Essential (primary) hypertension: Secondary | ICD-10-CM | POA: Diagnosis not present

## 2024-01-01 DIAGNOSIS — N1831 Chronic kidney disease, stage 3a: Secondary | ICD-10-CM | POA: Diagnosis not present

## 2024-01-01 NOTE — Progress Notes (Signed)
 Established Patient Office Visit   Subjective  Patient ID: Nancy Christensen, female    DOB: December 06, 1950  Age: 73 y.o. MRN: [id number]  Chief Complaint  Patient presents with   Follow-up    Patient is follow up on CPAP results and also has concerns regarding BP monitoring.    Patient is a 73 year old female seen for follow-up.  Patient states BP at home has been 140s/70s.  Patient notes occasional dull headache with elevated BP.  Taking irbesartan 150 mg daily.  Denies changes in medications.  Patient had home sleep study which indicated sleep apnea.  Trying to get CPAP set up.  Patient states she was told she oxazole needed oxygen  Pt states company told her she also need O2.  A rep had her walk at home and stated her pO2 decreased.  Pt denies SOB, DOE.    Taking weekly ergocalciferol.    Patient Active Problem List   Diagnosis Date Noted   Epistaxis 12/06/2023   Vitamin D deficiency 06/27/2023   Puncture wound of toe of left foot 05/06/2022   Hypertension    Hyperlipidemia    Arthritis    Primary osteoarthritis of left knee 03/11/2020   Primary osteoarthritis of left hip 11/26/2019   Constipation due to outlet dysfunction    Rectal pain    Dizziness 10/25/2018   Sinus bradycardia 10/25/2018   Atypical chest pain 10/25/2018   Dyslipidemia 10/25/2018   Chronic right shoulder pain 07/04/2018   Essential hypertension 04/28/2018   Hypertensive disorder 04/18/2018   Cholecystitis with cholelithiasis 02/11/2018   Multiple pulmonary nodules determined by computed tomography of lung 01/26/2018   Upper airway cough syndrome 01/25/2018   Past Medical History:  Diagnosis Date   Arthritis    left hip and knee   Atypical chest pain 10/25/2018   Cholecystitis with cholelithiasis 02/11/2018   Chronic right shoulder pain 07/04/2018   Constipation due to outlet dysfunction    Dizziness 10/25/2018   Dyslipidemia 10/25/2018   Essential hypertension 04/28/2018   Changed arb to CCB 04/24/2018  due to cough x one month trial    Hyperlipidemia    Hypertension    Hypertensive disorder 04/18/2018   Multiple pulmonary nodules determined by computed tomography of lung 01/26/2018   Passive smoke exp/ remote  Chest CT  09/24/17 MPN's  Largest 4 x 6 mm R laterally  > rec f/u 09/14/18 (reminder file)   As pt reports neg CT 2015 (not avail)  - ESR 01/25/2018 = 35  - CT chest 07/11/18 =  5 mm > no f/u needed    Primary osteoarthritis of left hip 11/26/2019   Primary osteoarthritis of left knee 03/11/2020   Rectal pain    Sinus bradycardia 10/25/2018   Unilateral primary osteoarthritis, left knee 06/14/2018   Upper airway cough syndrome 01/25/2018   Onset 1980 CT head 12/22/17 neg sinus dz FENO 01/25/2018  =   21 - Allergy profile 01/25/2018 >  Eos 0.1 /  IgE  17 RAST neg  - flare 02/12/18 p et  - gabapentin 100 mg tid 03/23/2018 > increased to max of 300 tid 04/24/2018 > improved so increased to 300 qid 06/05/2018 > d/c'd around 02/2019 s flare    Past Surgical History:  Procedure Laterality Date   ABDOMINAL HYSTERECTOMY  1997   ANAL RECTAL MANOMETRY N/A 08/09/2019   Procedure: ANO RECTAL MANOMETRY;  Surgeon: Tressia Danas, MD;  Location: [facility];  Service: Gastroenterology;  Laterality: N/A;   BREAST SURGERY  2002   left cyst removal    CHOLECYSTECTOMY N/A 02/12/2018   Procedure: LAPAROSCOPIC CHOLECYSTECTOMY;  Surgeon: Romie Levee, MD;  Location: [facility];  Service: General;  Laterality: N/A;   COLONOSCOPY  07/12/2019   POLYPECTOMY     Social History   Tobacco Use   Smoking status: Never   Smokeless tobacco: Never  Vaping Use   Vaping status: Never Used  Substance Use Topics   Alcohol use: Never   Drug use: Never   Family History  Problem Relation Age of Onset   Diabetes Mother    Hypertension Mother    Thyroid disease Mother    Vascular Disease Mother    Lung cancer Father    Colon cancer Maternal Uncle    Colon polyps Neg Hx    Esophageal cancer Neg Hx    Stomach cancer Neg Hx     Rectal cancer Neg Hx    Breast cancer Neg Hx    No Known Allergies    ROS Negative unless stated above    Objective:     BP (!) 174/98 (BP Location: Left Arm, Patient Position: Sitting, Cuff Size: Large)   Pulse 66   Temp 98 F (36.7 C) (Oral)   Wt 222 lb 12.8 oz (101.1 kg)   SpO2 96%   BMI 37.08 kg/m  BP Readings from Last 3 Encounters:  01/01/24 (!) 174/98  12/05/23 (!) 168/93  12/04/23 (!) 161/91   Wt Readings from Last 3 Encounters:  01/01/24 222 lb 12.8 oz (101.1 kg)  12/05/23 218 lb (98.9 kg)  11/30/23 220 lb 3.2 oz (99.9 kg)      Physical Exam Constitutional:      General: She is not in acute distress.    Appearance: Normal appearance.  HENT:     Head: Normocephalic and atraumatic.     Nose: Nose normal.     Mouth/Throat:     Mouth: Mucous membranes are moist.  Cardiovascular:     Rate and Rhythm: Normal rate and regular rhythm.     Heart sounds: Normal heart sounds. No murmur heard.    No gallop.  Pulmonary:     Effort: Pulmonary effort is normal. No respiratory distress.     Breath sounds: Normal breath sounds. No wheezing, rhonchi or rales.  Skin:    General: Skin is warm and dry.  Neurological:     Mental Status: She is alert and oriented to person, place, and time.      No results found for any visits on 01/01/24.    Assessment & Plan:  Essential hypertension  OSA (obstructive sleep apnea)  Stage 3a chronic kidney disease (HCC)  Vitamin D deficiency  BP remains uncontrolled.  Patient in the process of getting CPAP for OSA.  This will likely help BP.  Discussed adding an additional BP med.  In the past Norvasc caused edema.  Diltiazem caused bradycardia.  Losartan discontinued secondary to concerns about vascular permeability and O2 pathways.  Per chart review ACE [place] caused bronchitis-like symptoms.  Intolerance to Norvasc previously listed in chart however removed due to patient request.  Will increase Will increase  irbesartan to 225 mg daily due to med sensitivity in the past.  Pt will take a 150 mg tab with a 75 mg tab.  Monitor bp.    6-minute walk test performed due to positive testing results during a home visit with sleep study company.  pO2 98=>96% with walking.  No O2 needed.  Pt denies breathing difficulty and does not want to be on oxygen.  Continue to monitor.  Continue weekly ergocalciferol.  Return in about 5 weeks (around 02/05/2024).   Deeann Saint, MD

## 2024-01-01 NOTE — Patient Instructions (Signed)
 Take  a 75 mg tab and a 150 mg tab of irbesartan daily for a total dose of 225 mg.  Monitor bp.  Contact company about getting CPAP.  No oxygen needed.

## 2024-01-03 ENCOUNTER — Ambulatory Visit (INDEPENDENT_AMBULATORY_CARE_PROVIDER_SITE_OTHER)

## 2024-01-03 ENCOUNTER — Encounter (INDEPENDENT_AMBULATORY_CARE_PROVIDER_SITE_OTHER): Payer: Self-pay

## 2024-01-03 VITALS — Ht 65.0 in | Wt 220.0 lb

## 2024-01-03 DIAGNOSIS — R040 Epistaxis: Secondary | ICD-10-CM

## 2024-01-04 DIAGNOSIS — M6240 Contracture of muscle, unspecified site: Secondary | ICD-10-CM | POA: Diagnosis not present

## 2024-01-04 DIAGNOSIS — J019 Acute sinusitis, unspecified: Secondary | ICD-10-CM | POA: Diagnosis not present

## 2024-01-04 DIAGNOSIS — M9901 Segmental and somatic dysfunction of cervical region: Secondary | ICD-10-CM | POA: Diagnosis not present

## 2024-01-04 DIAGNOSIS — M9907 Segmental and somatic dysfunction of upper extremity: Secondary | ICD-10-CM | POA: Diagnosis not present

## 2024-01-04 NOTE — Progress Notes (Signed)
 Patient ID: Nancy Christensen, female   DOB: May 04, 1951, 73 y.o.   MRN: [id number]  Follow-up: Recurrent right epistaxis  HPI: The patient is a 73 year old female who returns today for her follow-up evaluation.  She was last seen 1 month ago.  At that time, she was complaining of recurrent right-sided epistaxis.  Hypervascular areas were noted on her right nasal septum.  She underwent endoscopic cauterization of her right nasal septum.  The patient returns today reporting no more bleeding over the past 3 weeks.  She is able to breathe through both nostrils.  She denies any recent nasal trauma.  Exam: General: Communicates without difficulty, well nourished, no acute distress. Head: Normocephalic, no evidence injury, no tenderness, facial buttresses intact without stepoff. Face/sinus: No tenderness to palpation and percussion. Facial movement is normal and symmetric. Eyes: PERRL, EOMI. No scleral icterus, conjunctivae clear. Neuro: CN II exam reveals vision grossly intact.  No nystagmus at any point of gaze. Ears: Auricles well formed without lesions.  Ear canals are intact without mass or lesion.  No erythema or edema is appreciated.  The TMs are intact without fluid. Nose: External evaluation reveals normal support and skin without lesions.  Dorsum is intact.  Anterior rhinoscopy reveals the right nasal septum to be well-healed.  Oral:  Oral cavity and oropharynx are intact, symmetric, without erythema or edema.  Mucosa is moist without lesions. Neck: Full range of motion without pain.  There is no significant lymphadenopathy.  No masses palpable.  Thyroid bed within normal limits to palpation.  Parotid glands and submandibular glands equal bilaterally without mass.  Trachea is midline. Neuro:  CN 2-12 grossly intact.   Assessment: 1.  The patient's recurrent right epistaxis is currently under controlled after her endoscopic cauterization procedure. 2.  The right nasal septum is well-healed.  No significant  hypervascular area or bleeding is noted.  Plan: 1.  The physical exam findings are reviewed with the patient. 2.  Continue with humidifier and nasal ointment as needed. 2.  The patient is encouraged to call with any questions or concerns.

## 2024-01-05 DIAGNOSIS — M1712 Unilateral primary osteoarthritis, left knee: Secondary | ICD-10-CM | POA: Diagnosis not present

## 2024-01-05 DIAGNOSIS — M25662 Stiffness of left knee, not elsewhere classified: Secondary | ICD-10-CM | POA: Diagnosis not present

## 2024-01-05 DIAGNOSIS — R262 Difficulty in walking, not elsewhere classified: Secondary | ICD-10-CM | POA: Diagnosis not present

## 2024-01-05 DIAGNOSIS — M25562 Pain in left knee: Secondary | ICD-10-CM | POA: Diagnosis not present

## 2024-01-09 ENCOUNTER — Telehealth: Payer: Self-pay | Admitting: *Deleted

## 2024-01-09 NOTE — Telephone Encounter (Signed)
 Copied from CRM [id number]. Topic: Clinical - Request for Lab/Test Order >> Jan 09, 2024  3:03 PM [place] J wrote: Reason for CRM: Patient would like an imaging order for her spine, neck, and shoulder. Patient stated that the she's trying io get acupuncture done but the Chiropractor needs to see an xray to know how to properly treat.

## 2024-01-10 NOTE — Telephone Encounter (Signed)
 Where is pt trying to be seen? Typically Chiropractors obtain their own imaging.   Even if imaging is ordered/done here the provider elsewhere will likely be unable to see those images if they do not have access to the system.

## 2024-01-15 NOTE — Telephone Encounter (Signed)
 Patient is aware.  Patient states that she is seeing Dr Leonette Ramal.  She was told that insurance will not cover xray's done at the chiropractor but will be covered by PCP.  Please advise.

## 2024-01-19 DIAGNOSIS — M170 Bilateral primary osteoarthritis of knee: Secondary | ICD-10-CM | POA: Diagnosis not present

## 2024-01-22 DIAGNOSIS — M9907 Segmental and somatic dysfunction of upper extremity: Secondary | ICD-10-CM | POA: Diagnosis not present

## 2024-01-22 DIAGNOSIS — J019 Acute sinusitis, unspecified: Secondary | ICD-10-CM | POA: Diagnosis not present

## 2024-01-22 DIAGNOSIS — M9901 Segmental and somatic dysfunction of cervical region: Secondary | ICD-10-CM | POA: Diagnosis not present

## 2024-01-22 DIAGNOSIS — M6240 Contracture of muscle, unspecified site: Secondary | ICD-10-CM | POA: Diagnosis not present

## 2024-01-30 DIAGNOSIS — M9907 Segmental and somatic dysfunction of upper extremity: Secondary | ICD-10-CM | POA: Diagnosis not present

## 2024-01-30 DIAGNOSIS — M9901 Segmental and somatic dysfunction of cervical region: Secondary | ICD-10-CM | POA: Diagnosis not present

## 2024-01-30 DIAGNOSIS — M6240 Contracture of muscle, unspecified site: Secondary | ICD-10-CM | POA: Diagnosis not present

## 2024-01-30 DIAGNOSIS — J019 Acute sinusitis, unspecified: Secondary | ICD-10-CM | POA: Diagnosis not present

## 2024-02-03 NOTE — Telephone Encounter (Signed)
 Please have pt schedule an appt as I do not have a diagnosis for why I would need to order spine xray for insurance to cover this cost.  Also as stated previously the chiropractor will likely not be able to access the images.

## 2024-02-06 NOTE — Telephone Encounter (Signed)
 Called patient to sch appt per Dr. Arliss Lam, left a VM per DPR to return call

## 2024-02-26 DIAGNOSIS — M170 Bilateral primary osteoarthritis of knee: Secondary | ICD-10-CM | POA: Diagnosis not present

## 2024-02-27 ENCOUNTER — Ambulatory Visit (INDEPENDENT_AMBULATORY_CARE_PROVIDER_SITE_OTHER): Admitting: Family Medicine

## 2024-02-27 ENCOUNTER — Encounter: Payer: Self-pay | Admitting: Family Medicine

## 2024-02-27 VITALS — BP 145/76

## 2024-02-27 DIAGNOSIS — Z0000 Encounter for general adult medical examination without abnormal findings: Secondary | ICD-10-CM

## 2024-02-27 NOTE — Patient Instructions (Signed)
 I really enjoyed getting to talk with you today! I am available on Tuesdays and Thursdays for virtual visits if you have any questions or concerns, or if I can be of any further assistance.   CHECKLIST FROM ANNUAL WELLNESS VISIT:  -Follow up (please call to schedule if not scheduled after visit):   -schedule follow up in 2 weeks in the office for stomach issues and elevated BP, also please request recheck [id number]   -yearly for annual wellness visit with primary care office  Here is a list of your preventive care/health maintenance measures and the plan for each if any are due:  PLAN For any measures below that may be due:     1. Can get vaccines at the pharmacy. Please let us  know if you do so that we can update your record.  Health Maintenance  Topic Date Due   Medicare Annual Wellness (AWV)  02/28/2024 (Originally 11/29/2023)   Zoster Vaccines- Shingrix (1 of 2) 03/01/2024 (Originally 05/03/2000)   COVID-19 Vaccine (1 - 2024-25 season) 03/14/2024 (Originally 05/28/2023)   Pneumonia Vaccine 97+ Years old (1 of 2 - PCV) 11/29/2024 (Originally 05/03/1969)   INFLUENZA VACCINE  04/26/2024   MAMMOGRAM  09/06/2025   Colonoscopy  10/26/2027   DTaP/Tdap/Td (2 - Tdap) 05/06/2032   DEXA SCAN  Completed   Hepatitis C Screening  Completed   HPV VACCINES  Aged Out   Meningococcal B Vaccine  Aged Out    -See a dentist at least yearly  -Get your eyes checked and then per your eye specialist's recommendations  -Other issues addressed today:   -I have included below further information regarding a healthy whole foods based diet, physical activity guidelines for adults, stress management and opportunities for social connections. I hope you find this information useful.    -----------------------------------------------------------------------------------------------------------------------------------------------------------------------------------------------------------------------------------------------------------    NUTRITION: -eat real food: lots of colorful vegetables (half the plate) and fruits -5-7 servings of vegetables and fruits per day (fresh or steamed is best), exp. 2 servings of vegetables with lunch and dinner and 2 servings of fruit per day. Berries and greens such as kale and collards are great choices.  -consume on a regular basis:  fresh fruits, fresh veggies, fish, nuts, seeds, healthy oils (such as olive oil, avocado oil), whole grains (make sure for bread/pasta/crackers/etc., that the first ingredient on label contains the word "whole"), legumes. -can eat small amounts of dairy and lean meat (no larger than the palm of your hand), but avoid processed meats such as ham, bacon, lunch meat, etc. -drink water -try to avoid fast food and pre-packaged foods, processed meat, ultra processed foods/beverages (donuts, candy, etc.) -most experts advise limiting sodium to < 2300mg  per day, should limit further is any chronic conditions such as high blood pressure, heart disease, diabetes, etc. The American Heart Association advised that < 1500mg  is is ideal -try to avoid foods/beverages that contain any ingredients with names you do not recognize  -try to avoid foods/beverages  with added sugar or sweeteners/sweets  -try to avoid sweet drinks (including diet drinks): soda, juice, Gatorade, sweet tea, power drinks, diet drinks -try to avoid white rice, white bread, pasta (unless whole grain)  EXERCISE GUIDELINES FOR ADULTS: -if you wish to increase your physical activity, do so gradually and with the approval of your doctor -STOP and seek medical care immediately if you have any chest pain, chest discomfort or trouble breathing when starting or  increasing exercise  -move and stretch your body, legs, feet and arms when  sitting for long periods -Physical activity guidelines for optimal health in adults: -get at least 150 minutes per week of moderate exercise (can talk, but not sing); this is about 20-30 minutes of sustained activity 5-7 days per week or two 10-15 minute episodes of sustained activity 5-7 days per week -do some muscle building/resistance training/strength training at least 2 days per week  -balance exercises 3+ days per week:   Stand somewhere where you have something sturdy to hold onto if you lose balance    1) lift up on toes, then back down, start with 5x per day and work up to 20x   2) stand and lift one leg straight out to the side so that foot is a few inches of the floor, start with 5x each side and work up to 20x each side   3) stand on one foot, start with 5 seconds each side and work up to 20 seconds on each side  If you need ideas or help with getting more active:  -Silver sneakers https://tools.silversneakers.com  -Walk with a Doc: http://www.duncan-williams.com/  -try to include resistance (weight lifting/strength building) and balance exercises twice per week: or the following link for ideas: http://castillo-powell.com/  BuyDucts.dk  STRESS MANAGEMENT: -can try meditating, or just sitting quietly with deep breathing while intentionally relaxing all parts of your body for 5 minutes daily -if you need further help with stress, anxiety or depression please follow up with your primary doctor or contact the wonderful folks at [facility]: [phone]  SOCIAL CONNECTIONS: -options in [place] if you wish to engage in more social and exercise related activities:  -Silver sneakers https://tools.silversneakers.com  -Walk with a Doc: http://www.duncan-williams.com/  -Check out the [facility] Active Adults 50+  section on the [place] [employer] (hiking clubs, book clubs, cards and games, chess, exercise classes, aquatic classes and much more) - see the website for details: https://www.[place].gov/departments/parks-recreation/active-adults50  -YouTube has lots of exercise videos for different ages and abilities as well  -[facility] (a variety of indoor and outdoor inperson activities for adults). [phone]. [address].  -Virtual Online Classes (a variety of topics): see seniorplanet.org or call [phone]  -consider volunteering at a school, hospice center, church, senior center or elsewhere

## 2024-02-27 NOTE — Progress Notes (Signed)
 PATIENT CHECK-IN and HEALTH RISK ASSESSMENT QUESTIONNAIRE:  -completed by phone/video for upcoming Medicare Preventive Visit  Pre-Visit Check-in: 1)Vitals (height, wt, BP, etc) - record in vitals section for visit on day of visit Request home vitals (wt, BP, etc.) and enter into vitals, THEN update Vital Signs SmartPhrase below at the top of the HPI. See below.  2)Review and Update Medications, Allergies PMH, Surgeries, Social history in Epic 3)Hospitalizations in the last year with date/reason? No   4)Review and Update Care Team (patient's specialists) in Epic 5) Complete PHQ9 in Epic  6) Complete Fall Screening in Epic 7)Review all Health Maintenance Due and order under PCP if not done.  Medicare Wellness Patient Questionnaire:  Answer theses question about your habits: How often do you have a drink containing alcohol?No  How many drinks containing alcohol do you have on a typical day when you are drinking?Na  How often do you have six or more drinks on one occasion?Na  Have you ever smoked?NO  Quit date if applicable? NA   How many packs a day do/did you smoke? NA  Do you use smokeless tobacco?No  Do you use an illicit drugs?NO  On average, how many days per week do you engage in moderate to strenuous exercise (like a brisk walk)?No  On average, how many minutes do you engage in exercise at this level?NO  Are you sexually active? NO Number of partners?NA  Typical breakfast: Fruit, water  Typical lunch:sandwich, fruit, ice tea  Typical dinner:Meat, Vegetable, bread  Typical snacks: Nuts, popcorn, fruit   Beverages: Water, sweet tea, coke  Reports gets out of the house frequently, involved in activities and works part time  Answer theses question about your everyday activities: Can you perform most household chores?Yes  Are you deaf or have significant trouble hearing?No  Do you feel that you have a problem with memory?No  Do you feel safe at home?Yes  Last dentist visit?3  month ago  8. Do you have any difficulty performing your everyday activities?No  Are you having any difficulty walking, taking medications on your own, and or difficulty managing daily home needs?No  Do you have difficulty walking or climbing stairs?NO  Do you have difficulty dressing or bathing?No  Do you have difficulty doing errands alone such as visiting a doctor's office or shopping?NO  Do you currently have any difficulty preparing food and eating?No  Do you currently have any difficulty using the toilet?No  Do you have any difficulty managing your finances?No  Do you have any difficulties with housekeeping of managing your housekeeping?No    Do you have Advanced Directives in place (Living Will, Healthcare Power or Attorney)? Yes    Last eye Exam and location?3 weeks ago, Dr. Alto Atta    Do you currently use prescribed or non-prescribed narcotic or opioid pain medications?No   Do you have a history or close family history of breast, ovarian, tubal or peritoneal cancer or a family member with BRCA (breast cancer susceptibility 1 and 2) gene mutations?NO    Nurse/Assistant Credentials/time stamp:Leah A.Wright CMA  9:56 pm     ----------------------------------------------------------------------------------------------------------------------------------------------------------------------------------------------------------------------  Because this visit was a virtual/telehealth visit, some criteria may be missing or patient reported. Any vitals not documented were not able to be obtained and vitals that have been documented are patient reported.    MEDICARE ANNUAL PREVENTIVE VISIT WITH PROVIDER: (Welcome to Medicare, initial annual wellness or annual wellness exam)  Virtual Visit via Video Note  I connected with Nancy Christensen on  02/27/24 by a video enabled telemedicine application and verified that I am speaking with the correct person using two identifiers.  Location  patient: home Location provider:work or home office Persons participating in the virtual visit: patient, provider  Concerns and/or follow up today: Doing ok. Intermittent epigastric discomfort and heartburn, usually in the morning a few days a week for a few weeks. Sometimes with bad taste in mouth. Denies fever, weight loss, vomiting, change in bowels, CP, SOB, hematochezia, melena. Hx of GERD remotely.    See HM section in Epic for other details of completed HM.    ROS: negative for report of fevers, unintentional weight loss, vision changes, vision loss, hearing loss or change, chest pain, sob, hemoptysis, melena, hematochezia, hematuria, falls, bleeding or bruising, thoughts of suicide or self harm, memory loss  Patient-completed extensive health risk assessment - reviewed and discussed with the patient: See Health Risk Assessment completed with patient prior to the visit either above or in recent phone note. This was reviewed in detailed with the patient today and appropriate recommendations, orders and referrals were placed as needed per Summary below and patient instructions.   Review of Medical History: -PMH, PSH, Family History and current specialty and care providers reviewed and updated and listed below   Patient Care Team: Viola Greulich, MD as PCP - General (Family Medicine) Debroah Fanning, MD as Referring Physician (Pediatric Cardiology)   Past Medical History:  Diagnosis Date   Arthritis    left hip and knee   Atypical chest pain 10/25/2018   Cholecystitis with cholelithiasis 02/11/2018   Chronic right shoulder pain 07/04/2018   Constipation due to outlet dysfunction    Dizziness 10/25/2018   Dyslipidemia 10/25/2018   Essential hypertension 04/28/2018   Changed arb to CCB 04/24/2018 due to cough x one month trial    Hyperlipidemia    Hypertension    Hypertensive disorder 04/18/2018   Multiple pulmonary nodules determined by computed tomography of lung 01/26/2018    Passive smoke exp/ remote  Chest CT  09/24/17 MPN's  Largest 4 x 6 mm R laterally  > rec f/u 09/14/18 (reminder file)   As pt reports neg CT 2015 (not avail)  - ESR 01/25/2018 = 35  - CT chest 07/11/18 =  5 mm > no f/u needed    Primary osteoarthritis of left hip 11/26/2019   Primary osteoarthritis of left knee 03/11/2020   Rectal pain    Sinus bradycardia 10/25/2018   Unilateral primary osteoarthritis, left knee 06/14/2018   Upper airway cough syndrome 01/25/2018   Onset 1980 CT head 12/22/17 neg sinus dz FENO 01/25/2018  =   21 - Allergy  profile 01/25/2018 >  Eos 0.1 /  IgE  17 RAST neg  - flare 02/12/18 p et  - gabapentin  100 mg tid 03/23/2018 > increased to max of 300 tid 04/24/2018 > improved so increased to 300 qid 06/05/2018 > d/c'd around 02/2019 s flare     Past Surgical History:  Procedure Laterality Date   ABDOMINAL HYSTERECTOMY  1997   ANAL RECTAL MANOMETRY N/A 08/09/2019   Procedure: ANO RECTAL MANOMETRY;  Surgeon: Lindle Rhea, MD;  Location: [facility];  Service: Gastroenterology;  Laterality: N/A;   BREAST SURGERY  2002   left cyst removal    CHOLECYSTECTOMY N/A 02/12/2018   Procedure: LAPAROSCOPIC CHOLECYSTECTOMY;  Surgeon: Joyce Nixon, MD;  Location: [facility];  Service: General;  Laterality: N/A;   COLONOSCOPY  07/12/2019   POLYPECTOMY  Social History   Socioeconomic History   Marital status: Divorced    Spouse name: Not on file   Number of children: Not on file   Years of education: Not on file   Highest education level: Bachelor's degree (e.g., BA, AB, BS)  Occupational History   Not on file  Tobacco Use   Smoking status: Never   Smokeless tobacco: Never  Vaping Use   Vaping status: Never Used  Substance and Sexual Activity   Alcohol use: Never   Drug use: Never   Sexual activity: Not Currently    Birth control/protection: Post-menopausal  Other Topics Concern   Not on file  Social History Narrative   Not on file   Social Drivers of Health   Financial  Resource Strain: Low Risk  (10/15/2023)   Overall Financial Resource Strain (CARDIA)    Difficulty of Paying Living Expenses: Not very hard  Food Insecurity: No Food Insecurity (10/15/2023)   Hunger Vital Sign    Worried About Running Out of Food in the Last Year: Never true    Ran Out of Food in the Last Year: Never true  Transportation Needs: No Transportation Needs (10/15/2023)   PRAPARE - Administrator, Civil Service (Medical): No    Lack of Transportation (Non-Medical): No  Physical Activity: Insufficiently Active (10/15/2023)   Exercise Vital Sign    Days of Exercise per Week: 1 day    Minutes of Exercise per Session: 30 min  Stress: No Stress Concern Present (10/15/2023)   [employer] of Occupational Health - Occupational Stress Questionnaire    Feeling of Stress : Only a little  Social Connections: Moderately Integrated (10/15/2023)   Social Connection and Isolation Panel [NHANES]    Frequency of Communication with Friends and Family: More than three times a week    Frequency of Social Gatherings with Friends and Family: Once a week    Attends Religious Services: More than 4 times per year    Active Member of [employer] or Organizations: Yes    Attends Engineer, structural: More than 4 times per year    Marital Status: Divorced  Intimate Partner Violence: Not At Risk (11/25/2021)   Humiliation, Afraid, Rape, and Kick questionnaire    Fear of Current or Ex-Partner: No    Emotionally Abused: No    Physically Abused: No    Sexually Abused: No    Family History  Problem Relation Age of Onset   Diabetes Mother    Hypertension Mother    Thyroid  disease Mother    Vascular Disease Mother    Lung cancer Father    Colon cancer Maternal Uncle    Colon polyps Neg Hx    Esophageal cancer Neg Hx    Stomach cancer Neg Hx    Rectal cancer Neg Hx    Breast cancer Neg Hx     Current Outpatient Medications on File Prior to Visit  Medication Sig Dispense Refill    Cyanocobalamin  (B-12 PO) Take by mouth.     irbesartan  (AVAPRO ) 150 MG tablet Take 1 tablet (150 mg total) by mouth daily. 90 tablet 1   Vitamin D , Ergocalciferol , (DRISDOL ) 1.25 MG (50000 UNIT) CAPS capsule Take 1 capsule (50,000 Units total) by mouth every 7 (seven) days. 12 capsule 0   No current facility-administered medications on file prior to visit.    No Known Allergies     Physical Exam Vitals requested from patient and listed below if patient had equipment and  was able to obtain at home for this virtual visit: Vitals:   02/27/24 0942  BP: (S) (!) 145/76   Estimated body mass index is 36.61 kg/m as calculated from the following:   Height as of 01/03/24: 5\' 5"  (1.651 m).   Weight as of 01/03/24: 220 lb (99.8 kg).  EKG (optional): deferred due to virtual visit  GENERAL: alert, oriented, no acute distress detected, full vision exam deferred due to pandemic and/or virtual encounter  HEENT: atraumatic, conjunttiva clear, no obvious abnormalities on inspection of external nose and ears  NECK: normal movements of the head and neck  LUNGS: on inspection no signs of respiratory distress, breathing rate appears normal, no obvious gross SOB, gasping or wheezing  CV: no obvious cyanosis  MS: moves all visible extremities without noticeable abnormality  PSYCH/NEURO: pleasant and cooperative, no obvious depression or anxiety, speech and thought processing grossly intact, Cognitive function grossly intact  Flowsheet Row Office Visit from 02/27/2024 in [facility] [place]  PHQ-9 Total Score 2           02/27/2024    9:45 AM 01/01/2024    2:16 PM 11/30/2023   10:17 AM 10/19/2023   11:16 AM 08/30/2023    9:49 AM  Depression screen PHQ 2/9  Decreased Interest 0 0 0 0 0  Down, Depressed, Hopeless 0 0 1 0 0  PHQ - 2 Score 0 0 1 0 0  Altered sleeping 1 1 1  0 0  Tired, decreased energy 1 1 1 1  0  Change in appetite 0 1 1 0 0  Feeling bad or failure about  yourself  0 0 0 0 0  Trouble concentrating 0 0 0 0 0  Moving slowly or fidgety/restless  0 0 0 0  Suicidal thoughts 0 0 0 0 0  PHQ-9 Score 2 3 4 1  0  Difficult doing work/chores  Not difficult at all Not difficult at all Not difficult at all        10/19/2023   11:16 AM 11/30/2023   10:08 AM 01/01/2024    2:15 PM 02/23/2024    1:59 PM 02/27/2024    9:45 AM  Fall Risk  Falls in the past year? 0 0 0 1 0  Was there an injury with Fall? 0 0 0 0 0  Fall Risk Category Calculator 0 0 0 1  0  Patient at Risk for Falls Due to No Fall Risks No Fall Risks No Fall Risks    Fall risk Follow up Falls evaluation completed Falls evaluation completed Falls evaluation completed  Falls evaluation completed     Patient-reported     SUMMARY AND PLAN:  Encounter for Medicare annual wellness exam   Discussed applicable health maintenance/preventive health measures and advised and referred or ordered per patient preferences: -discussed vaccines due recs and risks, she knows can get at the pharmacy, advise to let us  know if she does so that we can update record -discussed BP, goals, management and lifestyle recommendations, advised trial of lifestyle intervention with follow up in office check in 2 weeks -discussed possible GERD vs other for cause of symptoms and advised in office evaluation, sent message to schedulers to assist. In interim trial OTC treatments discussed risks/benefits and use of each - tums vs PPi, tetc.  Health Maintenance  Topic Date Due   Zoster Vaccines- Shingrix (1 of 2) 03/01/2024 (Originally 05/03/2000)   COVID-19 Vaccine (1 - 2024-25 season) 03/14/2024 (Originally 05/28/2023)   Pneumonia Vaccine  78+ Years old (1 of 2 - PCV) 11/29/2024 (Originally 05/03/1970)   INFLUENZA VACCINE  04/26/2024   Medicare Annual Wellness (AWV)  02/26/2025   MAMMOGRAM  09/06/2025   Colonoscopy  10/26/2027   DTaP/Tdap/Td (2 - Tdap) 05/06/2032   DEXA SCAN  Completed   Hepatitis C Screening  Completed   HPV  VACCINES  Aged Out   Meningococcal B Vaccine  Aged Out      Education and counseling on the following was provided based on the above review of health and a plan/checklist for the patient, along with additional information discussed, was provided for the patient in the patient instructions :   -Provided safe balance exercises that can be done at home to improve balance and discussed exercise guidelines for adults with include balance exercises at least 3 days per week.  -Advised and counseled on a healthy lifestyle - including the importance of a healthy diet, regular physical activity, social connections and stress management. -Reviewed patient's current diet. Advised and counseled on a whole foods based healthy diet. Discussed healthy diet with particular emphasis on evidence based recs for prediabetes and HTN. A summary of a healthy diet was provided in the Patient Instructions.  -reviewed patient's current physical activity level and discussed exercise guidelines for adults. Discussed community resources and ideas for safe exercise at home to assist in meeting exercise guideline recommendations in a safe and healthy way.  -Advise yearly dental visits at minimum and regular eye exams   Follow up: see patient instructions     Patient Instructions  I really enjoyed getting to talk with you today! I am available on Tuesdays and Thursdays for virtual visits if you have any questions or concerns, or if I can be of any further assistance.   CHECKLIST FROM ANNUAL WELLNESS VISIT:  -Follow up (please call to schedule if not scheduled after visit):   -schedule follow up in 2 weeks in the office for stomach issues and elevated BP, also please request recheck [id number]   -yearly for annual wellness visit with primary care office  Here is a list of your preventive care/health maintenance measures and the plan for each if any are due:  PLAN For any measures below that may be due:     1. Can get  vaccines at the pharmacy. Please let us  know if you do so that we can update your record.  Health Maintenance  Topic Date Due   Medicare Annual Wellness (AWV)  02/28/2024 (Originally 11/29/2023)   Zoster Vaccines- Shingrix (1 of 2) 03/01/2024 (Originally 05/03/2000)   COVID-19 Vaccine (1 - 2024-25 season) 03/14/2024 (Originally 05/28/2023)   Pneumonia Vaccine 44+ Years old (1 of 2 - PCV) 11/29/2024 (Originally 05/03/1970)   INFLUENZA VACCINE  04/26/2024   MAMMOGRAM  09/06/2025   Colonoscopy  10/26/2027   DTaP/Tdap/Td (2 - Tdap) 05/06/2032   DEXA SCAN  Completed   Hepatitis C Screening  Completed   HPV VACCINES  Aged Out   Meningococcal B Vaccine  Aged Out    -See a dentist at least yearly  -Get your eyes checked and then per your eye specialist's recommendations  -Other issues addressed today:   -I have included below further information regarding a healthy whole foods based diet, physical activity guidelines for adults, stress management and opportunities for social connections. I hope you find this information useful.   -----------------------------------------------------------------------------------------------------------------------------------------------------------------------------------------------------------------------------------------------------------    NUTRITION: -eat real food: lots of colorful vegetables (half the plate) and fruits -5-7 servings of vegetables and  fruits per day (fresh or steamed is best), exp. 2 servings of vegetables with lunch and dinner and 2 servings of fruit per day. Berries and greens such as kale and collards are great choices.  -consume on a regular basis:  fresh fruits, fresh veggies, fish, nuts, seeds, healthy oils (such as olive oil, avocado oil), whole grains (make sure for bread/pasta/crackers/etc., that the first ingredient on label contains the word "whole"), legumes. -can eat small amounts of dairy and lean meat (no larger than the palm  of your hand), but avoid processed meats such as ham, bacon, lunch meat, etc. -drink water -try to avoid fast food and pre-packaged foods, processed meat, ultra processed foods/beverages (donuts, candy, etc.) -most experts advise limiting sodium to < 2300mg  per day, should limit further is any chronic conditions such as high blood pressure, heart disease, diabetes, etc. The American Heart Association advised that < 1500mg  is is ideal -try to avoid foods/beverages that contain any ingredients with names you do not recognize  -try to avoid foods/beverages  with added sugar or sweeteners/sweets  -try to avoid sweet drinks (including diet drinks): soda, juice, Gatorade, sweet tea, power drinks, diet drinks -try to avoid white rice, white bread, pasta (unless whole grain)  EXERCISE GUIDELINES FOR ADULTS: -if you wish to increase your physical activity, do so gradually and with the approval of your doctor -STOP and seek medical care immediately if you have any chest pain, chest discomfort or trouble breathing when starting or increasing exercise  -move and stretch your body, legs, feet and arms when sitting for long periods -Physical activity guidelines for optimal health in adults: -get at least 150 minutes per week of moderate exercise (can talk, but not sing); this is about 20-30 minutes of sustained activity 5-7 days per week or two 10-15 minute episodes of sustained activity 5-7 days per week -do some muscle building/resistance training/strength training at least 2 days per week  -balance exercises 3+ days per week:   Stand somewhere where you have something sturdy to hold onto if you lose balance    1) lift up on toes, then back down, start with 5x per day and work up to 20x   2) stand and lift one leg straight out to the side so that foot is a few inches of the floor, start with 5x each side and work up to 20x each side   3) stand on one foot, start with 5 seconds each side and work up to 20  seconds on each side  If you need ideas or help with getting more active:  -Silver sneakers https://tools.silversneakers.com  -Walk with a Doc: http://www.duncan-williams.com/  -try to include resistance (weight lifting/strength building) and balance exercises twice per week: or the following link for ideas: http://castillo-powell.com/  BuyDucts.dk  STRESS MANAGEMENT: -can try meditating, or just sitting quietly with deep breathing while intentionally relaxing all parts of your body for 5 minutes daily -if you need further help with stress, anxiety or depression please follow up with your primary doctor or contact the wonderful folks at [facility]: [phone]  SOCIAL CONNECTIONS: -options in [place] if you wish to engage in more social and exercise related activities:  -Silver sneakers https://tools.silversneakers.com  -Walk with a Doc: http://www.duncan-williams.com/  -Check out the [facility] Active Adults 50+ section on the [place] [employer] (hiking clubs, book clubs, cards and games, chess, exercise classes, aquatic classes and much more) - see the website for details: https://www.[place].gov/departments/parks-recreation/active-adults50  -YouTube has lots of exercise  videos for different ages and abilities as well  -[facility] (a variety of indoor and outdoor inperson activities for adults). [phone]. [address]  -Virtual Online Classes (a variety of topics): see seniorplanet.org or call [phone]  -consider volunteering at a school, hospice center, church, senior center or elsewhere            Maurie Southern, DO

## 2024-03-12 ENCOUNTER — Other Ambulatory Visit: Payer: Self-pay

## 2024-03-12 ENCOUNTER — Emergency Department (HOSPITAL_COMMUNITY)

## 2024-03-12 ENCOUNTER — Emergency Department (HOSPITAL_COMMUNITY)
Admission: EM | Admit: 2024-03-12 | Discharge: 2024-03-12 | Disposition: A | Discharge status: Home / Self Care | Attending: Emergency Medicine | Admitting: Emergency Medicine

## 2024-03-12 DIAGNOSIS — M79604 Pain in right leg: Secondary | ICD-10-CM

## 2024-03-12 DIAGNOSIS — R600 Localized edema: Secondary | ICD-10-CM | POA: Diagnosis not present

## 2024-03-12 DIAGNOSIS — M7989 Other specified soft tissue disorders: Secondary | ICD-10-CM | POA: Insufficient documentation

## 2024-03-12 DIAGNOSIS — I10 Essential (primary) hypertension: Secondary | ICD-10-CM | POA: Diagnosis not present

## 2024-03-12 DIAGNOSIS — E876 Hypokalemia: Secondary | ICD-10-CM | POA: Diagnosis not present

## 2024-03-12 DIAGNOSIS — M79661 Pain in right lower leg: Secondary | ICD-10-CM | POA: Diagnosis not present

## 2024-03-12 LAB — BASIC METABOLIC PANEL WITH GFR
Anion gap: 8 (ref 5–15)
BUN: 15 mg/dL (ref 8–23)
CO2: 26 mmol/L (ref 22–32)
Calcium: 9.5 mg/dL (ref 8.9–10.3)
Chloride: 106 mmol/L (ref 98–111)
Creatinine, Ser: 1.14 mg/dL — ABNORMAL HIGH (ref 0.44–1.00)
GFR, Estimated: 51 mL/min — ABNORMAL LOW (ref >60)
Glucose, Bld: 95 mg/dL (ref 70–99)
Potassium: 3.3 mmol/L — ABNORMAL LOW (ref 3.5–5.1)
Sodium: 140 mmol/L (ref 135–145)

## 2024-03-12 LAB — BRAIN NATRIURETIC PEPTIDE: B Natriuretic Peptide: 82.3 pg/mL (ref 0.0–100.0)

## 2024-03-12 LAB — CBC WITH DIFFERENTIAL/PLATELET
Abs Immature Granulocytes: 0.03 10*3/uL (ref 0.00–0.07)
Basophils Absolute: 0.1 10*3/uL (ref 0.0–0.1)
Basophils Relative: 1 %
Eosinophils Absolute: 0.1 10*3/uL (ref 0.0–0.5)
Eosinophils Relative: 1 %
HCT: 40.9 % (ref 36.0–46.0)
Hemoglobin: 13.5 g/dL (ref 12.0–15.0)
Immature Granulocytes: 0 %
Lymphocytes Relative: 35 %
Lymphs Abs: 3.1 10*3/uL (ref 0.7–4.0)
MCH: 29.3 pg (ref 26.0–34.0)
MCHC: 33 g/dL (ref 30.0–36.0)
MCV: 88.7 fL (ref 80.0–100.0)
Monocytes Absolute: 0.6 10*3/uL (ref 0.1–1.0)
Monocytes Relative: 7 %
Neutro Abs: 5 10*3/uL (ref 1.7–7.7)
Neutrophils Relative %: 56 %
Platelets: 220 10*3/uL (ref 150–400)
RBC: 4.61 MIL/uL (ref 3.87–5.11)
RDW: 13.7 % (ref 11.5–15.5)
WBC: 8.9 10*3/uL (ref 4.0–10.5)
nRBC: 0 % (ref 0.0–0.2)

## 2024-03-12 MED ORDER — KETOROLAC TROMETHAMINE 15 MG/ML IJ SOLN
15.0000 mg | Freq: Once | INTRAMUSCULAR | Status: AC
  Administered 2024-03-12: 15 mg via INTRAVENOUS
  Filled 2024-03-12: qty 1

## 2024-03-12 NOTE — ED Notes (Signed)
 Labs/tubes collected and sent to Lab

## 2024-03-12 NOTE — ED Notes (Addendum)
 Pt denies shortness of breath or injury, endorses numbness in right lateral toes (3 smallest) that started today

## 2024-03-12 NOTE — Discharge Instructions (Signed)
 You were seen today for right leg pain.  I have low suspicion for any emergent causes of your symptoms today with reassuring physical exam, lab work, imaging.  Recommend to continue to follow-up with your PCP tomorrow for long-term management of medications we talked today are continuing to not relief pain.  Also recommend you continue to talk about your blood pressure with your PCP as it was significantly elevated today.  For pain medication, recommend you continue to take Tylenol  as well as Voltaren  gel over the right calf.  You can use over-the-counter lidocaine  patches as well as heat to help relieve muscle strain.  If swelling is present, recommend you use cold instead.  Continue to elevate legs and wear compression stockings to help decrease the swelling.  Return to the ED for been having new or worsening symptoms which will include uncontrollable pain, numbness, weakness, tingling, shortness of breath, chest pain, fever

## 2024-03-12 NOTE — ED Notes (Signed)
 Call to lab to run labs sent

## 2024-03-12 NOTE — ED Provider Notes (Signed)
 [place] EMERGENCY DEPARTMENT AT [facility] Provider Note   CSN: [id number] Arrival date & time: 03/12/24  1536     Patient presents with: Leg Pain   Nancy Christensen is a 73 y.o. female.    Leg Pain Patient is a 73 year old female presents ED today with concerns for right lower leg swelling and pain x 3 days, progressively worsening with each day.  Previous medical history of Upper airway cough syndrome, pulmonary nodules, HTN, arthritis, HLD.   She reports that the first day she was able to control pain with Tylenol  and Voltaren  gel.  However has been progressively having worsening, extending from calf down to heel.  No previous history of DVT, PE, not currently on any anticoagulation.  Denies any activity changes, noting that she is active during her work, noting frequent walking.  Denies hormone therapy.  Denies long trips.   Denies fever, headache, vision changes, chest pain, shortness of breath, abdominal pain, nausea, vomiting, diarrhea, dysuria, rashes, weakness, numbness, tingling.       Prior to Admission medications   Medication Sig Start Date End Date Taking? Authorizing Provider  Cyanocobalamin  (B-12 PO) Take by mouth.    [provider]  irbesartan  (AVAPRO ) 150 MG tablet Take 1 tablet (150 mg total) by mouth daily. 11/30/23   Viola Greulich, MD  Vitamin D , Ergocalciferol , (DRISDOL ) 1.25 MG (50000 UNIT) CAPS capsule Take 1 capsule (50,000 Units total) by mouth every 7 (seven) days. 12/06/23   Viola Greulich, MD    Allergies: Patient has no known allergies.    Review of Systems  Constitutional:  Negative for activity change.  Cardiovascular:  Positive for leg swelling.  All other systems reviewed and are negative.   Updated Vital Signs BP (!) 168/99   Pulse 67   Temp 98 F (36.7 C) (Oral)   Resp 16   SpO2 100%   Physical Exam Vitals and nursing note reviewed.  Constitutional:      General: She is not in acute distress.     Appearance: Normal appearance. She is not ill-appearing or diaphoretic.  HENT:     Head: Normocephalic and atraumatic.   Eyes:     General:        Right eye: No discharge.        Left eye: No discharge.     Extraocular Movements: Extraocular movements intact.     Conjunctiva/sclera: Conjunctivae normal.    Cardiovascular:     Rate and Rhythm: Normal rate and regular rhythm.     Pulses: Normal pulses.     Heart sounds: Normal heart sounds. No murmur heard.    No friction rub. No gallop.  Pulmonary:     Effort: Pulmonary effort is normal. No respiratory distress.     Breath sounds: No stridor. No wheezing, rhonchi or rales.  Chest:     Chest wall: No tenderness.  Abdominal:     General: Abdomen is flat. There is no distension.     Palpations: Abdomen is soft.     Tenderness: There is no abdominal tenderness. There is no guarding or rebound.   Musculoskeletal:        General: Tenderness (Tenderness to ovation along the right calf down to the right Achilles insertion) present. No deformity.     Cervical back: Normal range of motion. No rigidity.     Right lower leg: Edema present.     Left lower leg: Edema present.     Comments: DP pulse  2+ bilaterally   Skin:    General: Skin is warm and dry.     Findings: No bruising, erythema or rash.   Neurological:     General: No focal deficit present.     Mental Status: She is alert and oriented to person, place, and time. Mental status is at baseline.     Sensory: No sensory deficit.     Motor: No weakness.     Coordination: Coordination normal.     Gait: Gait normal.   Psychiatric:        Mood and Affect: Mood normal.     (all labs ordered are listed, but only abnormal results are displayed) Labs Reviewed  BASIC METABOLIC PANEL WITH GFR - Abnormal; Notable for the following components:      Result Value   Potassium 3.3 (*)    Creatinine, Ser 1.14 (*)    GFR, Estimated 51 (*)    All other components within normal limits   CBC WITH DIFFERENTIAL/PLATELET  BRAIN NATRIURETIC PEPTIDE    EKG: None  Radiology: VAS US  LOWER EXTREMITY VENOUS (DVT) (ONLY [facility]) Result Date: 03/12/2024  Lower Venous DVT Study Patient Name:  Nancy Christensen  Date of Exam:   03/12/2024 Medical Rec #: [id number]         Accession #:    [id number] Date of Birth: Oct 18, 1950          Patient Gender: F Patient Age:   17 years Exam Location:  [facility] Procedure:      VAS US  LOWER EXTREMITY VENOUS (DVT) Referring Phys: Veatrice Georgis --------------------------------------------------------------------------------  Indications: RLE pain and swelling.  Limitations: Poor ultrasound/tissue interface. Comparison Study: No previous exams Performing Technologist: Jody Hill RVT, RDMS  Examination Guidelines: A complete evaluation includes B-mode imaging, spectral Doppler, color Doppler, and power Doppler as needed of all accessible portions of each vessel. Bilateral testing is considered an integral part of a complete examination. Limited examinations for reoccurring indications may be performed as noted. The reflux portion of the exam is performed with the patient in reverse Trendelenburg.  +---------+---------------+---------+-----------+----------+-------------------+ RIGHT    CompressibilityPhasicitySpontaneityPropertiesThrombus Aging      +---------+---------------+---------+-----------+----------+-------------------+ CFV      Full           Yes      Yes                                      +---------+---------------+---------+-----------+----------+-------------------+ SFJ      Full                                                             +---------+---------------+---------+-----------+----------+-------------------+ FV Prox  Full           Yes      Yes                                      +---------+---------------+---------+-----------+----------+-------------------+ FV Mid   Full           Yes      Yes                                       +---------+---------------+---------+-----------+----------+-------------------+  FV DistalFull           Yes      Yes                                      +---------+---------------+---------+-----------+----------+-------------------+ PFV      Full                                                             +---------+---------------+---------+-----------+----------+-------------------+ POP      Full           Yes      Yes                                      +---------+---------------+---------+-----------+----------+-------------------+ PTV      Full                                         Not well visualized +---------+---------------+---------+-----------+----------+-------------------+ PERO     Full                                         Not well visualized +---------+---------------+---------+-----------+----------+-------------------+   +---------+---------------+---------+-----------+----------+-------------------+ LEFT     CompressibilityPhasicitySpontaneityPropertiesThrombus Aging      +---------+---------------+---------+-----------+----------+-------------------+ CFV      Full           Yes      Yes                                      +---------+---------------+---------+-----------+----------+-------------------+ SFJ      Full                                                             +---------+---------------+---------+-----------+----------+-------------------+ FV Prox  Full           Yes      Yes                                      +---------+---------------+---------+-----------+----------+-------------------+ FV Mid   Full           Yes      Yes                                      +---------+---------------+---------+-----------+----------+-------------------+ FV DistalFull           Yes      Yes                                       +---------+---------------+---------+-----------+----------+-------------------+  PFV      Full                                                             +---------+---------------+---------+-----------+----------+-------------------+ POP      Full           Yes      Yes                                      +---------+---------------+---------+-----------+----------+-------------------+ PTV      Full                                         Not well visualized +---------+---------------+---------+-----------+----------+-------------------+ PERO     Full                                         Not well visualized +---------+---------------+---------+-----------+----------+-------------------+    Summary: BILATERAL: - No evidence of deep vein thrombosis seen in the lower extremities, bilaterally. -No evidence of popliteal cyst, bilaterally. RIGHT: - No cystic structure found in the popliteal fossa 3.63 x 0.85 x 1.82 cm. Subcutaneous edema of calf and ankle   *See table(s) above for measurements and observations.    Preliminary      Procedures   Medications Ordered in the ED  ketorolac  (TORADOL ) 15 MG/ML injection 15 mg (15 mg Intravenous Given 03/12/24 1705)                                 Medical Decision Making Amount and/or Complexity of Data Reviewed Labs: ordered.  Risk Prescription drug management.   This patient is a 73 year old female who presents to the ED for concern of right lower leg pain and swelling x 3 days, progressively getting worse.   Noted to not have any other symptoms otherwise.  On physical exam, patient is in no acute distress, afebrile, alert and orient x 4, speaking in full sentences, nontachypneic, nontachycardic.  LCTAB, RRR, no murmur.  Noted to have bilateral pitting lower leg edema, grade 1.  Noted to have pain to palpation across right calf down to right Achilles insertion on heel.  Negative Homans' sign bilaterally.  No erythema or rash  noted.  Was noted to be hypertensive with a BP of 188/94.  With right-sided leg swelling and calf pain, will ultrasound legs to evaluate for DVT as well as A BNP due to the bilateral nature of the swelling.  As well as get baseline labs for her being in hypertensive urgency at this time.  Lab work is unremarkable and unchanged from baseline,Imaging was negative for DVT.  Suspect pain in right leg secondary to either muscle strain or venous insufficiency.  With no signs of infectious or vascular causes at this time.  Will have her follow With PCP which she already has a scheduled appointment with tomorrow.  And will have her manage pain at home with over-the-counter medications.  Patient vital signs have remained stable throughout  the course of patient's time in the ED. Low suspicion for any other emergent pathology at this time. I believe this patient is safe to be discharged. Provided strict return to ER precautions. Patient expressed agreement and understanding of plan. All questions were answered.  Differential diagnoses prior to evaluation: The emergent differential diagnosis includes, but is not limited to, patient urgency/emergency, DVT, cellulitis, muscle strain, arthritis, venous insufficiency, arterial insufficiency. This is not an exhaustive differential.   Past Medical History / Co-morbidities / Social History: Upper airway cough syndrome, pulmonary nodules, HTN, arthritis, HLD  Status post polypectomy, cholecystectomy hysterectomy  Additional history: Chart reviewed. Pertinent results include:   Last seen by PCP on 02/27/24  Lab Tests/Imaging studies: I personally interpreted labs/imaging and the pertinent results include:    CBC unremarkable BMP unremarkable BMP shows a mild hypokalemia 3.3 as well as a mildly elevated creatinine of 1.14 and decreased GFR 51, similar to previous. DVT of lower legs unremarkable.  I agree with the radiologist interpretation.    Medications:   I have reviewed the patients home medicines and have made adjustments as needed.  Critical Interventions: none  Social Determinants of Health: none  Disposition: After consideration of the diagnostic results and the patients response to treatment, I feel that the patient would benefit from discharge and treatment as above.   emergency department workup does not suggest an emergent condition requiring admission or immediate intervention beyond what has been performed at this time. The plan is: Follow-up with PCP tomorrow, return to the ED for new or worsening symptoms, symptomatic management at home. The patient is safe for discharge and has been instructed to return immediately for worsening symptoms, change in symptoms or any other concerns.    Final diagnoses:  Right leg pain    ED Discharge Orders     None          Vevelyn Gowers 03/12/24 Mason Sole, MD 03/13/24 [phone]

## 2024-03-12 NOTE — ED Triage Notes (Signed)
 Pt c/o right leg (calf) pain and swelling for 3 days.

## 2024-03-12 NOTE — Progress Notes (Signed)
 BLE venous duplex has been completed.  Preliminary results given to Veatrice Georgis, PA-C.   Results can be found under chart review under CV PROC. 03/12/2024 6:08 PM Marylouise Mallet RVT, RDMS

## 2024-03-13 ENCOUNTER — Ambulatory Visit (INDEPENDENT_AMBULATORY_CARE_PROVIDER_SITE_OTHER): Admitting: Family Medicine

## 2024-03-13 ENCOUNTER — Telehealth: Payer: Self-pay

## 2024-03-13 ENCOUNTER — Encounter: Payer: Self-pay | Admitting: Family Medicine

## 2024-03-13 VITALS — BP 140/74 | HR 63 | Temp 98.8°F | Ht 65.0 in | Wt 229.0 lb

## 2024-03-13 DIAGNOSIS — R635 Abnormal weight gain: Secondary | ICD-10-CM | POA: Diagnosis not present

## 2024-03-13 DIAGNOSIS — I10 Essential (primary) hypertension: Secondary | ICD-10-CM

## 2024-03-13 DIAGNOSIS — G4733 Obstructive sleep apnea (adult) (pediatric): Secondary | ICD-10-CM

## 2024-03-13 DIAGNOSIS — K219 Gastro-esophageal reflux disease without esophagitis: Secondary | ICD-10-CM

## 2024-03-13 DIAGNOSIS — E876 Hypokalemia: Secondary | ICD-10-CM

## 2024-03-13 DIAGNOSIS — R202 Paresthesia of skin: Secondary | ICD-10-CM | POA: Diagnosis not present

## 2024-03-13 DIAGNOSIS — R252 Cramp and spasm: Secondary | ICD-10-CM | POA: Diagnosis not present

## 2024-03-13 LAB — POCT GLYCOSYLATED HEMOGLOBIN (HGB A1C): Hemoglobin A1C: 5.7 % — AB (ref 4.0–5.6)

## 2024-03-13 MED ORDER — POTASSIUM CHLORIDE CRYS ER 20 MEQ PO TBCR: 20.0000 meq | EXTENDED_RELEASE_TABLET | Freq: Every day | ORAL | 0 refills | Status: DC

## 2024-03-13 MED ORDER — SPIRONOLACTONE 25 MG PO TABS: 25.0000 mg | ORAL_TABLET | Freq: Every day | ORAL | 3 refills | Status: AC

## 2024-03-13 NOTE — Progress Notes (Signed)
 Established Patient Office Visit   Subjective  Patient ID: Nancy Christensen, female    DOB: [birth year]  Age: 73 y.o. MRN: [id number]  Chief Complaint  Patient presents with   Medical Management of Chronic Issues    Patient came in today for a 2 Week follow-up per Dr. Burdette Carolin, patient is having stomach issues, elevated Bp (at home BP is 148/76) and a A1c recheck, patient was also seen in the ED 6/17 for right leg pain which started 4 days ago, pulsating charlie horse Rate of pain 5 out of 10     Patient is a 73 year old female seen for ongoing concerns.  Patient seen in ED on 12/11/2023 for right lateral lower leg cramping and pain 3 days.  Labs and imaging negative for DVT.  Given Toradol  injection in ED.  Hypokalemia as potassium 3.3 in ED.  Patient notes BP elevation.  Having slight headache intermittently.  BP this morning 148/76 at home.  Taking irbesartan  225 mg daily due to prior sensitivities to medications.  Patient started using CPAP consistently for the last week.  States may have had 4 nights because she was able to wear CPAP continuously.  Had prior issues with facemask not sealing.  Does feel more rested in AM.  Patient mentions increasing intake of junk food craving carbs.  Craving hamburgers and grilled cheese sandwiches.  May have a salad for lunch and baked potato with butter for dinner.  Having ice cream, [employer], or hash puppies after dinner.  Patient states this is the most she has ever weighed.  Concerned it is getting out of control.  Also endorses numbness and tingling in feet between 2nd and 3rd toes.  Seen by podiatry given steroid injection.  Had normal testing by an outside company that recommended pt invest in expensive equipment to correct symptoms.  Patient had heartburn symptoms during recent AWV.  States at the time was eating fried seafood with old [place] seasoning while in [place] .  Symptoms were relieved by Tums.      Patient Active Problem List   Diagnosis  Date Noted   Epistaxis 12/06/2023   Vitamin D  deficiency 06/27/2023   Puncture wound of toe of left foot 05/06/2022   Hypertension    Hyperlipidemia    Arthritis    Primary osteoarthritis of left knee 03/11/2020   Primary osteoarthritis of left hip 11/26/2019   Constipation due to outlet dysfunction    Rectal pain    Dizziness 10/25/2018   Sinus bradycardia 10/25/2018   Atypical chest pain 10/25/2018   Dyslipidemia 10/25/2018   Chronic right shoulder pain 07/04/2018   Essential hypertension 04/28/2018   Hypertensive disorder 04/18/2018   Cholecystitis with cholelithiasis 02/11/2018   Multiple pulmonary nodules determined by computed tomography of lung 01/26/2018   Upper airway cough syndrome 01/25/2018   Past Medical History:  Diagnosis Date   Arthritis 2019   left hip and knee   Atypical chest pain 10/25/2018   Cholecystitis with cholelithiasis 02/11/2018   Chronic right shoulder pain 07/04/2018   Constipation due to outlet dysfunction    Dizziness 10/25/2018   Dyslipidemia 10/25/2018   Essential hypertension 04/28/2018   Changed arb to CCB 04/24/2018 due to cough x one month trial    Hyperlipidemia    Hypertension    Hypertensive disorder 04/18/2018   Multiple pulmonary nodules determined by computed tomography of lung 01/26/2018   Passive smoke exp/ remote  Chest CT  09/24/17 MPN's  Largest 4 x 6 mm R  laterally  > rec f/u 09/14/18 (reminder file)   As pt reports neg CT 2015 (not avail)  - ESR 01/25/2018 = 35  - CT chest 07/11/18 =  5 mm > no f/u needed    Primary osteoarthritis of left hip 11/26/2019   Primary osteoarthritis of left knee 03/11/2020   Rectal pain    Sinus bradycardia 10/25/2018   Unilateral primary osteoarthritis, left knee 06/14/2018   Upper airway cough syndrome 01/25/2018   Onset 1980 CT head 12/22/17 neg sinus dz FENO 01/25/2018  =   21 - Allergy  profile 01/25/2018 >  Eos 0.1 /  IgE  17 RAST neg  - flare 02/12/18 p et  - gabapentin  100 mg tid 03/23/2018 >  increased to max of 300 tid 04/24/2018 > improved so increased to 300 qid 06/05/2018 > d/c'd around 02/2019 s flare    Past Surgical History:  Procedure Laterality Date   ABDOMINAL HYSTERECTOMY  1997   ANAL RECTAL MANOMETRY N/A 08/09/2019   Procedure: ANO RECTAL MANOMETRY;  Surgeon: Lindle Rhea, MD;  Location: [facility];  Service: Gastroenterology;  Laterality: N/A;   BREAST SURGERY  2002   left cyst removal    CHOLECYSTECTOMY N/A 02/12/2018   Procedure: LAPAROSCOPIC CHOLECYSTECTOMY;  Surgeon: Joyce Nixon, MD;  Location: [facility];  Service: General;  Laterality: N/A;   COLONOSCOPY  07/12/2019   POLYPECTOMY     Social History   Tobacco Use   Smoking status: Never   Smokeless tobacco: Never  Vaping Use   Vaping status: Never Used  Substance Use Topics   Alcohol use: Never   Drug use: Never   Family History  Problem Relation Age of Onset   Diabetes Mother    Hypertension Mother    Thyroid  disease Mother    Vascular Disease Mother    Varicose Veins Mother    Lung cancer Father    Colon cancer Maternal Uncle    Kidney disease Sister    Colon polyps Neg Hx    Esophageal cancer Neg Hx    Stomach cancer Neg Hx    Rectal cancer Neg Hx    Breast cancer Neg Hx    No Known Allergies  ROS Negative unless stated above    Objective:     BP (!) 140/74 (BP Location: Left Arm, Patient Position: Sitting, Cuff Size: Normal)   Pulse 63   Temp 98.8 F (37.1 C) (Oral)   Ht 5' 5 (1.651 m)   Wt 229 lb (103.9 kg)   SpO2 95%   BMI 38.11 kg/m  BP Readings from Last 3 Encounters:  03/13/24 (!) 140/74  03/12/24 (!) 168/87  02/27/24 (S) (!) 145/76   Wt Readings from Last 3 Encounters:  03/13/24 229 lb (103.9 kg)  01/03/24 220 lb (99.8 kg)  01/01/24 222 lb 12.8 oz (101.1 kg)      Physical Exam Constitutional:      General: She is not in acute distress.    Appearance: Normal appearance.  HENT:     Head: Normocephalic and atraumatic.     Nose: Nose normal.      Mouth/Throat:     Mouth: Mucous membranes are moist.   Cardiovascular:     Rate and Rhythm: Normal rate and regular rhythm.     Heart sounds: Normal heart sounds. No murmur heard.    No gallop.  Pulmonary:     Effort: Pulmonary effort is normal. No respiratory distress.     Breath sounds: Normal breath sounds. No  wheezing, rhonchi or rales.   Skin:    General: Skin is warm and dry.   Neurological:     Mental Status: She is alert and oriented to person, place, and time.        03/13/2024    3:56 PM 02/27/2024    9:45 AM 01/01/2024    2:16 PM  Depression screen PHQ 2/9  Decreased Interest 0 0 0  Down, Depressed, Hopeless 0 0 0  PHQ - 2 Score 0 0 0  Altered sleeping 1 1 1   Tired, decreased energy 1 1 1   Change in appetite 1 0 1  Feeling bad or failure about yourself  0 0 0  Trouble concentrating 0 0 0  Moving slowly or fidgety/restless 0  0  Suicidal thoughts 0 0 0  PHQ-9 Score 3 2 3   Difficult doing work/chores Not difficult at all  Not difficult at all      03/13/2024    3:56 PM 02/27/2024    9:47 AM 01/01/2024    2:16 PM 11/30/2023   10:17 AM  GAD 7 : Generalized Anxiety Score  Nervous, Anxious, on Edge 0 1 0 1  Control/stop worrying 0 0 0 0  Worry too much - different things 1 1 0 0  Trouble relaxing 1 0 0 0  Restless 0 0 0 0  Easily annoyed or irritable 0 0 0 0  Afraid - awful might happen 0 0 0 0  Total GAD 7 Score 2 2 0 1  Anxiety Difficulty Not difficult at all  Not difficult at all Not difficult at all     Results for orders placed or performed in visit on 03/13/24  POC HgB A1c  Result Value Ref Range   Hemoglobin A1C 5.7 (A) 4.0 - 5.6 %   HbA1c POC (<> result, manual entry)     HbA1c, POC (prediabetic range)     HbA1c, POC (controlled diabetic range)        Assessment & Plan:   Essential hypertension -     Spironolactone; Take 1 tablet (25 mg total) by mouth daily.  Dispense: 90 tablet; Refill: 3  OSA on CPAP  Hypokalemia -     Potassium Chloride   Crys ER; Take 1 tablet (20 mEq total) by mouth daily for 3 days.  Dispense: 3 tablet; Refill: 0  Paresthesia -     POCT glycosylated hemoglobin (Hb A1C)  Leg cramp -     Potassium Chloride  Crys ER; Take 1 tablet (20 mEq total) by mouth daily for 3 days.  Dispense: 3 tablet; Refill: 0  Gastroesophageal reflux disease, unspecified whether esophagitis present  Weight gain -     POCT glycosylated hemoglobin (Hb A1C)  BP uncontrolled.  Initial BP 150/72.  Repeat 140/74.  Irbesartan  previously increased to 225 mg due to pt sensitivity to BP med notes in the past.  Norvasc  caused edema, diltiazem  caused bradycardia, ACE inhibitors caused bronchitis/respiratory symptoms, and losartan  discontinued 2/2 concerns about vascular permeability and O2 pathways.  Will start spironolactone 25 mg daily.  Monitor BP at home and keep a log to bring with you to clinic.  As BPs begins to improve with medication and consistent CPAP use can wean off of irbesartan .  Hypokalemia noted in the ED as potassium 3.3.  Given 3 days of potassium supplement.  Will likely see improvement in potassium with diet changes and start of spironolactone.  Potassium likely contributing to muscle cramping.  Patient to monitor.  For continued cramping  repeat potassium and obtain magnesium.  Body mass index is 38.11 kg/m.  Current weight 229 lbs.  Cravings and increase intake of junk food likely contributing to weight gain.  Patient advised to try to stop eating by 7 PM.  Discussed the importance of increasing protein intake and water.  Hemoglobin A1c 5.7% this visit which places patient in prediabetic range.  Recent heartburn symptoms likely 2/2 intake of spicy and fried foods.  Discussed diet changes.  Tums or OTC PPI if needed for continued symptoms.  Paresthesias likely associated with current diet.  Vitamin B12 was low normal at 229 on 11/30/2023.  Pt continue OTC supplement.  Decrease carb intake.  Increase physical activity.  Return  in about 3 weeks (around 04/03/2024) for blood pressure.   Viola Greulich, MD

## 2024-03-13 NOTE — Transitions of Care (Post Inpatient/ED Visit) (Signed)
   03/13/2024  Name: MAUDINE KLUESNER MRN: [id number] DOB: [birth year]  Today's TOC FU Call Status: Today's TOC FU Call Status:: Successful TOC FU Call Completed TOC FU Call Complete Date: 03/13/24  Attempted to reach the patient regarding the most recent Inpatient/ED visit.  Follow Up Plan: No further outreach attempts will be made at this time.   Signature : Patrycja Mumpower, CMA

## 2024-03-25 ENCOUNTER — Encounter (HOSPITAL_BASED_OUTPATIENT_CLINIC_OR_DEPARTMENT_OTHER): Payer: Self-pay | Admitting: Emergency Medicine

## 2024-03-25 ENCOUNTER — Emergency Department (HOSPITAL_BASED_OUTPATIENT_CLINIC_OR_DEPARTMENT_OTHER)
Admission: EM | Admit: 2024-03-25 | Discharge: 2024-03-25 | Disposition: A | Discharge status: Home / Self Care | Attending: Emergency Medicine | Admitting: Emergency Medicine

## 2024-03-25 ENCOUNTER — Emergency Department (HOSPITAL_BASED_OUTPATIENT_CLINIC_OR_DEPARTMENT_OTHER): Admitting: Radiology

## 2024-03-25 ENCOUNTER — Other Ambulatory Visit: Payer: Self-pay

## 2024-03-25 DIAGNOSIS — W1843XA Slipping, tripping and stumbling without falling due to stepping from one level to another, initial encounter: Secondary | ICD-10-CM | POA: Diagnosis not present

## 2024-03-25 DIAGNOSIS — M25561 Pain in right knee: Secondary | ICD-10-CM | POA: Insufficient documentation

## 2024-03-25 DIAGNOSIS — I10 Essential (primary) hypertension: Secondary | ICD-10-CM | POA: Insufficient documentation

## 2024-03-25 DIAGNOSIS — M1711 Unilateral primary osteoarthritis, right knee: Secondary | ICD-10-CM | POA: Diagnosis not present

## 2024-03-25 DIAGNOSIS — S8991XA Unspecified injury of right lower leg, initial encounter: Secondary | ICD-10-CM | POA: Diagnosis not present

## 2024-03-25 NOTE — ED Triage Notes (Signed)
 Pt via pov from home with right knee pain x 1 week. Pt states she stepped wrong coming down steps. Pt was seen for similar symptoms on 6/17 at [facility] hospital, but pain continues. Pt a&o x 4; nad noted.

## 2024-03-25 NOTE — Discharge Instructions (Addendum)
 You were seen in the ER today for concerns of right knee pain. Your xray was negative for any signs of any fracture or dislocation. On your exam, you had pain towards the outside portion of your knee with different forms of stress placed which could be due to worsening arthritis but given your injury, could be due injury to your LCL. I would strongly advise following up with your orthopedic provider for further evaluation and testing as needed. You can use the knee sleeve that you were provided as needed for additional support. For any concerns of new or worsening symptoms, return to the ER.

## 2024-03-25 NOTE — ED Provider Notes (Signed)
 [place] EMERGENCY DEPARTMENT AT [facility] Provider Note   CSN: [id number] Arrival date & time: 03/25/24  1211     Patient presents with: Knee Pain   Nancy Christensen is a 73 y.o. female.  Patient presents to the ED for concerns of knee pain.  Past history significant for osteoarthritis of bilateral knees, hypertension, hyperlipidemia.  She reports that she was initially seen at [facility] on 6/17 for concerns of pain to the right shin after her initial injury.  She reports that she missed stepped after walking down some stairs and felt that her knee buckled/gave out.  He has been isolated to the lateral aspect of the right knee since this initial injury.  Denies any obvious feelings of tingling or numbness but does endorse occasional flareups of pain towards the anterior lateral shin of the right leg.  Denies any obvious dropfoot but does feel that she gets excessively fatigued/cramp in the lower leg when she tries to ambulate for short periods of time.  Tolerates weightbearing.    Knee Pain      Prior to Admission medications   Medication Sig Start Date End Date Taking? Authorizing Provider  Cyanocobalamin  (B-12 PO) Take by mouth. Patient not taking: Reported on 03/13/2024    [provider]  irbesartan  (AVAPRO ) 150 MG tablet Take 1 tablet (150 mg total) by mouth daily. 11/30/23   Mercer Clotilda SAUNDERS, MD  potassium chloride  SA (KLOR-CON  M) 20 MEQ tablet Take 1 tablet (20 mEq total) by mouth daily for 3 days. 03/13/24 03/16/24  Mercer Clotilda SAUNDERS, MD  spironolactone  (ALDACTONE ) 25 MG tablet Take 1 tablet (25 mg total) by mouth daily. 03/13/24   Mercer Clotilda SAUNDERS, MD  Vitamin D , Ergocalciferol , (DRISDOL ) 1.25 MG (50000 UNIT) CAPS capsule Take 1 capsule (50,000 Units total) by mouth every 7 (seven) days. Patient not taking: Reported on 03/13/2024 12/06/23   Mercer Clotilda SAUNDERS, MD    Allergies: Patient has no known allergies.    Review of Systems  Musculoskeletal:        Knee  pain  All other systems reviewed and are negative.   Updated Vital Signs BP (!) 180/102 (BP Location: Right Arm)   Pulse (!) 59   Temp 98.2 F (36.8 C) (Oral)   Resp 18   Ht 5' 5 (1.651 m)   Wt 104.3 kg   SpO2 97%   BMI 38.27 kg/m   Physical Exam Vitals and nursing note reviewed.  Constitutional:      General: She is not in acute distress.    Appearance: She is well-developed.  HENT:     Head: Normocephalic and atraumatic.   Eyes:     Conjunctiva/sclera: Conjunctivae normal.    Cardiovascular:     Rate and Rhythm: Normal rate and regular rhythm.     Heart sounds: No murmur heard. Pulmonary:     Effort: Pulmonary effort is normal. No respiratory distress.     Breath sounds: Normal breath sounds.  Abdominal:     Palpations: Abdomen is soft.     Tenderness: There is no abdominal tenderness.   Musculoskeletal:        General: Tenderness and signs of injury present. No swelling or deformity. Normal range of motion.     Cervical back: Neck supple.     Right lower leg: No edema.     Left lower leg: No edema.     Comments: Worsened pain to lateral with valgus and varus stress. Negative Lachman's. No significant  crepitus appreciated. Neurovascularly intact.   Skin:    General: Skin is warm and dry.     Capillary Refill: Capillary refill takes less than 2 seconds.   Neurological:     Mental Status: She is alert.     Comments: BLE strength 5/5.  Psychiatric:        Mood and Affect: Mood normal.     (all labs ordered are listed, but only abnormal results are displayed) Labs Reviewed - No data to display  EKG: None  Radiology: DG Knee Complete 4 Views Right Result Date: 03/25/2024 CLINICAL DATA:  Pain injury. EXAM: RIGHT KNEE - COMPLETE 4 VIEW COMPARISON:  None Available. FINDINGS: No fracture or dislocation. Preserved joint spaces and bone mineralization. Only minimal osteophytes seen along the medial compartment and patellofemoral joint. No joint effusion on  lateral view. IMPRESSION: Slight degenerative changes. Electronically Signed   By: Ranell Bring M.D.   On: 03/25/2024 13:33     Procedures   Medications Ordered in the ED - No data to display                                  Medical Decision Making Amount and/or Complexity of Data Reviewed Radiology: ordered.   This patient presents to the ED for concern of knee pain.  Differential diagnosis includes osteoarthritis, LCL injury, ACL injury, peroneal nerve injury, stress fracture, joint effusion   Imaging Studies ordered:  I ordered imaging studies including x-ray of the right knee I independently visualized and interpreted imaging which showed slight degenerative changes I agree with the radiologist interpretation   Problem List / ED Course:  Patient with past history significant for osteoarthritis of bilateral knees, hypertension, hyperlipidemia presents the emergency department with concerns of knee pain.  Reports about 1 to 2 weeks ago had a misstep when she was walking down some stairs and felt that her right knee gave out/buckled.  Endorsing continued pain since then with intermittent episodes of flareups of pain to the anterolateral right shin.  No prior history of any orthopedic surgeries in this area.  Denies blood thinner use.  No reported fever, chills, erythema or induration of the skin of the knee. On exam, patient has significant tenderness with valgus and varus testing towards the lateral aspect of the right knee.  There is some midline tenderness present likely secondary to arthritic changes.  No appreciable joint effusion.  The skin is nonerythematous, nonindurated, and no palpable warmth is noted. Given area of pain on exam, suspect likely LCL injury given patient's mechanism.  Within differential is peroneal nerve injury given patient's reported complaints of quick aggravation/cramping of the anterolateral lower leg with activity.  No dropfoot observed.  Bilateral lower  extremity strength is 5 out of 5. With these findings, will place patient into a knee sleeve for additional support.  Advise close follow-up with orthopedics for further evaluation.  Encouraged continued use of Tylenol  ibuprofen  for pain management.  Also advised leg ovation and rest when not needing to be weightbearing.  No other acute or focal concerns at this time.  Otherwise stable for outpatient follow-up.  Discharged home in stable condition.  Final diagnoses:  Acute pain of right knee    ED Discharge Orders     None          Cecily Legrand LABOR, PA-C 03/25/24 1536    Levander Houston, MD 04/04/24 1019

## 2024-03-27 ENCOUNTER — Telehealth: Payer: Self-pay

## 2024-03-27 NOTE — Transitions of Care (Post Inpatient/ED Visit) (Signed)
   03/27/2024  Name: Nancy Christensen MRN: [id number] DOB: [birth year]  Today's TOC FU Call Status: Today's TOC FU Call Status:: Successful TOC FU Call Completed TOC FU Call Complete Date: 03/27/24 Patient's Name and Date of Birth confirmed.  Transition Care Management Follow-up Telephone Call Date of Discharge: 03/25/24 Discharge Facility: [facility] ([facility]) Type of Discharge: Emergency Department Reason for ED Visit: Other: (Right knee pain and calf pain) How have you been since you were released from the hospital?: Same Any questions or concerns?: No  Items Reviewed: Did you receive and understand the discharge instructions provided?: Yes Medications obtained,verified, and reconciled?: Yes (Medications Reviewed) Any new allergies since your discharge?: No Dietary orders reviewed?: NA Do you have support at home?: No  Medications Reviewed Today: Medications Reviewed Today   Medications were not reviewed in this encounter     Home Care and Equipment/Supplies: Were Home Health Services Ordered?: No Any new equipment or medical supplies ordered?: No  Functional Questionnaire: Do you need assistance with bathing/showering or dressing?: No Do you need assistance with meal preparation?: No Do you need assistance with eating?: No Do you have difficulty maintaining continence: No Do you need assistance with getting out of bed/getting out of a chair/moving?: No Do you have difficulty managing or taking your medications?: No  Follow up appointments reviewed: PCP Follow-up appointment confirmed?: Yes Date of PCP follow-up appointment?: 04/04/24 Follow-up Provider: Dr. Mercer Specialist [facility] Follow-up appointment confirmed?: Yes Date of Specialist follow-up appointment?: 04/02/24 Follow-Up Specialty Provider:: Dr. JERRI Do you need transportation to your follow-up appointment?: No Do you understand care options if your condition(s) worsen?: Yes-patient verbalized  understanding    SIGNATURE:Arshia Rondon A.Rudie Rikard CMA 11:28 am

## 2024-04-02 ENCOUNTER — Ambulatory Visit (INDEPENDENT_AMBULATORY_CARE_PROVIDER_SITE_OTHER): Admitting: Orthopaedic Surgery

## 2024-04-02 DIAGNOSIS — M25561 Pain in right knee: Secondary | ICD-10-CM

## 2024-04-02 MED ORDER — NAPROXEN 500 MG PO TABS: 500.0000 mg | ORAL_TABLET | Freq: Two times a day (BID) | ORAL | 3 refills | Status: DC

## 2024-04-02 NOTE — Progress Notes (Signed)
 Office Visit Note   Patient: ALEXISMARIE FLAIM           Date of Birth: [birth year]           MRN: [id number] Visit Date: 04/02/2024              Requested by: Mercer Clotilda SAUNDERS, MD [address] [place],  [place] [address] PCP: Mercer Clotilda SAUNDERS, MD   Assessment & Plan: Visit Diagnoses:  1. Acute pain of right knee     Plan: History of Present Illness SINCLAIR ARRAZOLA is a 73 year old female who presents with right knee pain. She was referred by the ER for further evaluation after she ruled out fractures.  She has experienced right knee pain for two weeks, especially when descending stairs. The pain is localized on the lateral aspect and radiates down the calf. There is stiffness and a sensation of swelling, but no locking or popping. The knee occasionally gives out.  An ultrasound ruled out deep vein thrombosis. X-rays on June 30th showed no fractures or dislocation. An MRI was not performed due to lack of equipment at the ER.  She is taking Tylenol  and acetaminophen . A soft knee brace provides some compression and support. She is cautious with movement to prevent further injury. There is no use of blood thinners.  Physical Exam MUSCULOSKELETAL: Right knee small effusion with tenderness along the lateral joint line.  Results RADIOLOGY Right knee ultrasound: No DVT Right knee x-ray (03/25/2024): No fractures or dislocation  Assessment and Plan Right knee pain Pain for two weeks, exacerbated by stairs, lateral aspect, radiating to calf. Differential: meniscus tear, chondral injury, bone bruise. X-ray negative for fractures/dislocations. Conservative management preferred due to insurance requirements for MRI. - Prescribe anti-inflammatory medication. - Advise wearing a soft knee brace for support. - Provide home exercises for the knee. - Consider MRI if symptoms persist after six weeks of conservative treatment. - Advise follow-up if symptoms worsen before six  weeks.   Follow-Up Instructions: No follow-ups on file.   Orders:  No orders of the defined types were placed in this encounter.  Meds ordered this encounter  Medications   naproxen  (NAPROSYN ) 500 MG tablet    Sig: Take 1 tablet (500 mg total) by mouth 2 (two) times daily with a meal.    Dispense:  30 tablet    Refill:  3      Procedures: No procedures performed   Clinical Data: No additional findings.   Subjective: Chief Complaint  Patient presents with   Right Knee - Pain    HPI  Review of Systems  Constitutional: Negative.   HENT: Negative.    Eyes: Negative.   Respiratory: Negative.    Cardiovascular: Negative.   Endocrine: Negative.   Musculoskeletal: Negative.   Neurological: Negative.   Hematological: Negative.   Psychiatric/Behavioral: Negative.    All other systems reviewed and are negative.    Objective: Vital Signs: There were no vitals taken for this visit.  Physical Exam Vitals and nursing note reviewed.  Constitutional:      Appearance: She is well-developed.  HENT:     Head: Atraumatic.     Nose: Nose normal.  Eyes:     Extraocular Movements: Extraocular movements intact.  Cardiovascular:     Pulses: Normal pulses.  Pulmonary:     Effort: Pulmonary effort is normal.  Abdominal:     Palpations: Abdomen is soft.  Musculoskeletal:     Cervical back: Neck supple.  Skin:    General: Skin is warm.     Capillary Refill: Capillary refill takes less than 2 seconds.  Neurological:     Mental Status: She is alert. Mental status is at baseline.  Psychiatric:        Behavior: Behavior normal.        Thought Content: Thought content normal.        Judgment: Judgment normal.     Ortho Exam  Specialty Comments:  No specialty comments available.  Imaging: No results found.   PMFS History: Patient Active Problem List   Diagnosis Date Noted   Epistaxis 12/06/2023   Vitamin D  deficiency 06/27/2023   Puncture wound of toe of left  foot 05/06/2022   Hypertension    Hyperlipidemia    Arthritis    Primary osteoarthritis of left knee 03/11/2020   Primary osteoarthritis of left hip 11/26/2019   Constipation due to outlet dysfunction    Rectal pain    Dizziness 10/25/2018   Sinus bradycardia 10/25/2018   Atypical chest pain 10/25/2018   Dyslipidemia 10/25/2018   Chronic right shoulder pain 07/04/2018   Essential hypertension 04/28/2018   Hypertensive disorder 04/18/2018   Cholecystitis with cholelithiasis 02/11/2018   Multiple pulmonary nodules determined by computed tomography of lung 01/26/2018   Upper airway cough syndrome 01/25/2018   Past Medical History:  Diagnosis Date   Arthritis 2019   left hip and knee   Atypical chest pain 10/25/2018   Cholecystitis with cholelithiasis 02/11/2018   Chronic right shoulder pain 07/04/2018   Constipation due to outlet dysfunction    Dizziness 10/25/2018   Dyslipidemia 10/25/2018   Essential hypertension 04/28/2018   Changed arb to CCB 04/24/2018 due to cough x one month trial    Hyperlipidemia    Hypertension    Hypertensive disorder 04/18/2018   Multiple pulmonary nodules determined by computed tomography of lung 01/26/2018   Passive smoke exp/ remote  Chest CT  09/24/17 MPN's  Largest 4 x 6 mm R laterally  > rec f/u 09/14/18 (reminder file)   As pt reports neg CT 2015 (not avail)  - ESR 01/25/2018 = 35  - CT chest 07/11/18 =  5 mm > no f/u needed    Primary osteoarthritis of left hip 11/26/2019   Primary osteoarthritis of left knee 03/11/2020   Rectal pain    Sinus bradycardia 10/25/2018   Unilateral primary osteoarthritis, left knee 06/14/2018   Upper airway cough syndrome 01/25/2018   Onset 1980 CT head 12/22/17 neg sinus dz FENO 01/25/2018  =   21 - Allergy  profile 01/25/2018 >  Eos 0.1 /  IgE  17 RAST neg  - flare 02/12/18 p et  - gabapentin  100 mg tid 03/23/2018 > increased to max of 300 tid 04/24/2018 > improved so increased to 300 qid 06/05/2018 > d/c'd around 02/2019 s  flare     Family History  Problem Relation Age of Onset   Diabetes Mother    Hypertension Mother    Thyroid  disease Mother    Vascular Disease Mother    Varicose Veins Mother    Lung cancer Father    Colon cancer Maternal Uncle    Kidney disease Sister    Colon polyps Neg Hx    Esophageal cancer Neg Hx    Stomach cancer Neg Hx    Rectal cancer Neg Hx    Breast cancer Neg Hx     Past Surgical History:  Procedure Laterality Date   ABDOMINAL HYSTERECTOMY  1997  ANAL RECTAL MANOMETRY N/A 08/09/2019   Procedure: ANO RECTAL MANOMETRY;  Surgeon: Eda Iha, MD;  Location: [facility];  Service: Gastroenterology;  Laterality: N/A;   BREAST SURGERY  2002   left cyst removal    CHOLECYSTECTOMY N/A 02/12/2018   Procedure: LAPAROSCOPIC CHOLECYSTECTOMY;  Surgeon: Debby Hila, MD;  Location: [facility];  Service: General;  Laterality: N/A;   COLONOSCOPY  07/12/2019   POLYPECTOMY     Social History   Occupational History   Not on file  Tobacco Use   Smoking status: Never   Smokeless tobacco: Never  Vaping Use   Vaping status: Never Used  Substance and Sexual Activity   Alcohol use: Never   Drug use: Never   Sexual activity: Not Currently    Birth control/protection: Post-menopausal

## 2024-04-03 DIAGNOSIS — R600 Localized edema: Secondary | ICD-10-CM | POA: Diagnosis not present

## 2024-04-03 DIAGNOSIS — I10 Essential (primary) hypertension: Secondary | ICD-10-CM | POA: Diagnosis not present

## 2024-04-03 DIAGNOSIS — I872 Venous insufficiency (chronic) (peripheral): Secondary | ICD-10-CM | POA: Diagnosis not present

## 2024-04-03 DIAGNOSIS — I83893 Varicose veins of bilateral lower extremities with other complications: Secondary | ICD-10-CM | POA: Diagnosis not present

## 2024-04-04 ENCOUNTER — Encounter: Payer: Self-pay | Admitting: Family Medicine

## 2024-04-04 ENCOUNTER — Ambulatory Visit (INDEPENDENT_AMBULATORY_CARE_PROVIDER_SITE_OTHER): Admitting: Family Medicine

## 2024-04-04 ENCOUNTER — Other Ambulatory Visit: Payer: Self-pay | Admitting: Family Medicine

## 2024-04-04 VITALS — BP 126/80 | HR 55 | Temp 98.2°F | Ht 65.0 in | Wt 227.8 lb

## 2024-04-04 DIAGNOSIS — E66812 Obesity, class 2: Secondary | ICD-10-CM | POA: Diagnosis not present

## 2024-04-04 DIAGNOSIS — Z7184 Encounter for health counseling related to travel: Secondary | ICD-10-CM | POA: Diagnosis not present

## 2024-04-04 DIAGNOSIS — Z6836 Body mass index (BMI) 36.0-36.9, adult: Secondary | ICD-10-CM | POA: Diagnosis not present

## 2024-04-04 DIAGNOSIS — I10 Essential (primary) hypertension: Secondary | ICD-10-CM | POA: Diagnosis not present

## 2024-04-04 MED ORDER — AZITHROMYCIN 500 MG PO TABS: ORAL_TABLET | ORAL | 0 refills | Status: AC

## 2024-04-04 NOTE — Progress Notes (Addendum)
 Established Patient Office Visit   Subjective  Patient ID: Nancy Christensen, female    DOB: 1951/01/02  Age: 73 y.o. MRN: [id number]  Chief Complaint  Patient presents with   Medical Management of Chronic Issues    Patient came in today for 3 week Blood pressure check     Patient is a 73 year old female seen for follow-up.  Patient states she is doing much better and has noticed improvement in blood pressure since starting spironolactone  25 mg daily.  Also taking irbesartan  225 mg daily.  BP readings at home 142/78, 140/80, 138/79, 139/80, 140/81, 140/80, 131/70 in am.  Pt will be traveling to [place] with her daughter in a few months.  Inquires about travel precautions.    Patient Active Problem List   Diagnosis Date Noted   Epistaxis 12/06/2023   Vitamin D  deficiency 06/27/2023   Puncture wound of toe of left foot 05/06/2022   Hypertension    Hyperlipidemia    Arthritis    Primary osteoarthritis of left knee 03/11/2020   Primary osteoarthritis of left hip 11/26/2019   Constipation due to outlet dysfunction    Rectal pain    Dizziness 10/25/2018   Sinus bradycardia 10/25/2018   Atypical chest pain 10/25/2018   Dyslipidemia 10/25/2018   Chronic right shoulder pain 07/04/2018   Essential hypertension 04/28/2018   Hypertensive disorder 04/18/2018   Cholecystitis with cholelithiasis 02/11/2018   Multiple pulmonary nodules determined by computed tomography of lung 01/26/2018   Upper airway cough syndrome 01/25/2018   Past Medical History:  Diagnosis Date   Arthritis 2019   left hip and knee   Atypical chest pain 10/25/2018   Cholecystitis with cholelithiasis 02/11/2018   Chronic right shoulder pain 07/04/2018   Constipation due to outlet dysfunction    Dizziness 10/25/2018   Dyslipidemia 10/25/2018   Essential hypertension 04/28/2018   Changed arb to CCB 04/24/2018 due to cough x one month trial    Hyperlipidemia    Hypertension    Hypertensive disorder  04/18/2018   Multiple pulmonary nodules determined by computed tomography of lung 01/26/2018   Passive smoke exp/ remote  Chest CT  09/24/17 MPN's  Largest 4 x 6 mm R laterally  > rec f/u 09/14/18 (reminder file)   As pt reports neg CT 2015 (not avail)  - ESR 01/25/2018 = 35  - CT chest 07/11/18 =  5 mm > no f/u needed    Primary osteoarthritis of left hip 11/26/2019   Primary osteoarthritis of left knee 03/11/2020   Rectal pain    Sinus bradycardia 10/25/2018   Unilateral primary osteoarthritis, left knee 06/14/2018   Upper airway cough syndrome 01/25/2018   Onset 1980 CT head 12/22/17 neg sinus dz FENO 01/25/2018  =   21 - Allergy  profile 01/25/2018 >  Eos 0.1 /  IgE  17 RAST neg  - flare 02/12/18 p et  - gabapentin  100 mg tid 03/23/2018 > increased to max of 300 tid 04/24/2018 > improved so increased to 300 qid 06/05/2018 > d/c'd around 02/2019 s flare    Past Surgical History:  Procedure Laterality Date   ABDOMINAL HYSTERECTOMY  1997   ANAL RECTAL MANOMETRY N/A 08/09/2019   Procedure: ANO RECTAL MANOMETRY;  Surgeon: Eda Iha, MD;  Location: [facility];  Service: Gastroenterology;  Laterality: N/A;   BREAST SURGERY  2002   left cyst removal    CHOLECYSTECTOMY N/A 02/12/2018   Procedure: LAPAROSCOPIC CHOLECYSTECTOMY;  Surgeon: Debby Hila, MD;  Location: [facility];  Service: General;  Laterality: N/A;   COLONOSCOPY  07/12/2019   POLYPECTOMY     Social History   Tobacco Use   Smoking status: Never   Smokeless tobacco: Never  Vaping Use   Vaping status: Never Used  Substance Use Topics   Alcohol use: Never   Drug use: Never   Family History  Problem Relation Age of Onset   Diabetes Mother    Hypertension Mother    Thyroid  disease Mother    Vascular Disease Mother    Varicose Veins Mother    Lung cancer Father    Colon cancer Maternal Uncle    Kidney disease Sister    Colon polyps Neg Hx    Esophageal cancer Neg Hx    Stomach cancer Neg Hx    Rectal cancer Neg Hx     Breast cancer Neg Hx    No Known Allergies  ROS Negative unless stated above    Objective:     BP 126/80 (BP Location: Left Arm, Patient Position: Sitting, Cuff Size: Large)   Pulse (!) 55   Temp 98.2 F (36.8 C) (Oral)   Ht 5' 5 (1.651 m)   Wt 227 lb 12.8 oz (103.3 kg)   SpO2 95%   BMI 37.91 kg/m  BP Readings from Last 3 Encounters:  04/04/24 126/80  03/25/24 (!) 180/102  03/13/24 (!) 140/74   Wt Readings from Last 3 Encounters:  04/04/24 227 lb 12.8 oz (103.3 kg)  03/25/24 230 lb (104.3 kg)  03/13/24 229 lb (103.9 kg)      Physical Exam Constitutional:      Appearance: Normal appearance.  HENT:     Head: Normocephalic and atraumatic.     Nose: Nose normal.     Mouth/Throat:     Mouth: Mucous membranes are moist.  Eyes:     Extraocular Movements: Extraocular movements intact.  Cardiovascular:     Rate and Rhythm: Normal rate.  Pulmonary:     Effort: Pulmonary effort is normal.  Skin:    General: Skin is warm and dry.  Neurological:     Mental Status: She is alert and oriented to person, place, and time.        04/04/2024   11:19 AM 04/04/2024   11:18 AM 03/13/2024    3:56 PM  Depression screen PHQ 2/9  Decreased Interest  0 0  Down, Depressed, Hopeless  0 0  PHQ - 2 Score  0 0  Altered sleeping  0 1  Tired, decreased energy  0 1  Change in appetite  1 1  Feeling bad or failure about yourself   0 0  Trouble concentrating  0 0  Moving slowly or fidgety/restless  0 0  Suicidal thoughts  0 0  PHQ-9 Score  1 3  Difficult doing work/chores Not difficult at all  Not difficult at all      04/04/2024   11:19 AM 03/13/2024    3:56 PM 02/27/2024    9:47 AM 01/01/2024    2:16 PM  GAD 7 : Generalized Anxiety Score  Nervous, Anxious, on Edge 0 0 1 0  Control/stop worrying 0 0 0 0  Worry too much - different things 0 1 1 0  Trouble relaxing 0 1 0 0  Restless 0 0 0 0  Easily annoyed or irritable 0 0 0 0  Afraid - awful might happen 0 0 0 0  Total GAD 7  Score 0 2 2 0  Anxiety  Difficulty Not difficult at all Not difficult at all  Not difficult at all     No results found for any visits on 04/04/24.    Assessment & Plan:   Essential hypertension  Class 2 severe obesity with serious comorbidity and body mass index (BMI) of 36.0 to 36.9 in adult, unspecified obesity type (HCC)  Travel advice encounter -     Azithromycin ; Take 2 tablets once for severe diarrhea.  For continued symptoms the next day take 1 tablet daily on days 2 and 3.  Dispense: 4 tablet; Refill: 0  BP improving now well-controlled.  126/80 this visit.  Previously 150/72 03/13/2024.  Continue irbesartan  225 mg daily (taking 75 mg and 150 mg  tabs for the total daily dose of 225 mg) and spironolactone  25 mg daily.  Continue lifestyle modifications.  Congratulated on 2 pound weight loss since last OFV.  Modifications including changes to diet and incorporating exercise.  Discussed travel precautions including avoiding beverages that are not bottled, avoid ice, etc.  Given rx for abx in the event of traveler's diarrhea.  Return in about 4 months (around 08/05/2024).   Clotilda JONELLE Single, MD

## 2024-04-09 DIAGNOSIS — M170 Bilateral primary osteoarthritis of knee: Secondary | ICD-10-CM | POA: Diagnosis not present

## 2024-04-10 ENCOUNTER — Telehealth: Payer: Self-pay | Admitting: Cardiology

## 2024-04-10 NOTE — Telephone Encounter (Signed)
 Pt would like to switch from [place] to [place]

## 2024-04-25 DIAGNOSIS — S8391XA Sprain of unspecified site of right knee, initial encounter: Secondary | ICD-10-CM | POA: Diagnosis not present

## 2024-04-25 DIAGNOSIS — X501XXA Overexertion from prolonged static or awkward postures, initial encounter: Secondary | ICD-10-CM | POA: Diagnosis not present

## 2024-04-25 DIAGNOSIS — Y92815 Train as the place of occurrence of the external cause: Secondary | ICD-10-CM | POA: Diagnosis not present

## 2024-04-30 DIAGNOSIS — M25561 Pain in right knee: Secondary | ICD-10-CM | POA: Diagnosis not present

## 2024-04-30 DIAGNOSIS — M170 Bilateral primary osteoarthritis of knee: Secondary | ICD-10-CM | POA: Diagnosis not present

## 2024-04-30 DIAGNOSIS — M25562 Pain in left knee: Secondary | ICD-10-CM | POA: Diagnosis not present

## 2024-05-02 DIAGNOSIS — S83271A Complex tear of lateral meniscus, current injury, right knee, initial encounter: Secondary | ICD-10-CM | POA: Diagnosis not present

## 2024-05-08 ENCOUNTER — Ambulatory Visit: Admitting: Orthopaedic Surgery

## 2024-05-08 ENCOUNTER — Encounter: Payer: Self-pay | Admitting: Orthopaedic Surgery

## 2024-05-08 DIAGNOSIS — S83271A Complex tear of lateral meniscus, current injury, right knee, initial encounter: Secondary | ICD-10-CM | POA: Diagnosis not present

## 2024-05-08 NOTE — Progress Notes (Signed)
 Office Visit Note   Patient: Nancy Christensen           Date of Birth: 07-Aug-1951           MRN: [id number] Visit Date: 05/08/2024              Requested by: Mercer Clotilda SAUNDERS, MD [address] [place],  [place] [address] PCP: Mercer Clotilda SAUNDERS, MD   Assessment & Plan: Visit Diagnoses:  1. Complex tear of lateral meniscus of right knee as current injury, initial encounter     Plan: History of Present Illness Nancy Christensen is a 73 year old female who presents with right knee pain..  She experiences severe pain and swelling in her right knee, which began in the DC area, preventing her from touching the floor with her toe and causing immobility. The pain radiates down the leg and was initially accompanied by significant swelling. Currently, she has tenderness and pain on the exterior of the knee, with episodes of pain and instability affecting her ability to walk consistently. She can walk well for several days before experiencing these episodes.  An MRI showed a tear in her meniscus following a severe episode. She uses a walker, which is ineffective, and is considering a cane for better mobility. Her medication regimen includes naproxen  as needed, used cautiously due to its effect on her blood pressure, and topical gel for pain relief. She also takes Tylenol  as needed. She wears a brace for support and stability.  Results RADIOLOGY Knee MRI: Complex lateral meniscus tear, advanced patellofemoral arthritis  Right knee shows lateral joint line tenderness.  Positive mcmurray sign.  Assessment and Plan Right knee lateral meniscus tear Complex tear confirmed by MRI causing mechanical symptoms.  Outside MRI reviewed. - Schedule outpatient arthroscopic surgery for meniscus debridement. - detailed surgical plan discussed - Issue a six-month handicap parking permit. - Continue using the knee brace for stability. - Use Tylenol  for pain management. - Avoid excessive use of naproxen   due to blood pressure concerns.  Total face to face encounter time was greater than 25 minutes and over half of this time was spent in counseling and/or coordination of care.  Follow-Up Instructions: No follow-ups on file.   Orders:  No orders of the defined types were placed in this encounter.  No orders of the defined types were placed in this encounter.   Subjective: Chief Complaint  Patient presents with   Right Knee - Pain    HPI  Review of Systems  Constitutional: Negative.   HENT: Negative.    Eyes: Negative.   Respiratory: Negative.    Cardiovascular: Negative.   Endocrine: Negative.   Musculoskeletal: Negative.   Neurological: Negative.   Hematological: Negative.   Psychiatric/Behavioral: Negative.    All other systems reviewed and are negative.    Objective: Vital Signs: There were no vitals taken for this visit.  Physical Exam Vitals and nursing note reviewed.  Constitutional:      Appearance: She is well-developed.  HENT:     Head: Atraumatic.     Nose: Nose normal.  Eyes:     Extraocular Movements: Extraocular movements intact.  Cardiovascular:     Pulses: Normal pulses.  Pulmonary:     Effort: Pulmonary effort is normal.  Abdominal:     Palpations: Abdomen is soft.  Musculoskeletal:     Cervical back: Neck supple.  Skin:    General: Skin is warm.     Capillary Refill: Capillary refill  takes less than 2 seconds.  Neurological:     Mental Status: She is alert. Mental status is at baseline.  Psychiatric:        Behavior: Behavior normal.        Thought Content: Thought content normal.        Judgment: Judgment normal.     Past Medical History:  Diagnosis Date   Arthritis 2019   left hip and knee   Atypical chest pain 10/25/2018   Cholecystitis with cholelithiasis 02/11/2018   Chronic right shoulder pain 07/04/2018   Constipation due to outlet dysfunction    Dizziness 10/25/2018   Dyslipidemia 10/25/2018   Essential hypertension  04/28/2018   Changed arb to CCB 04/24/2018 due to cough x one month trial    Hyperlipidemia    Hypertension    Hypertensive disorder 04/18/2018   Multiple pulmonary nodules determined by computed tomography of lung 01/26/2018   Passive smoke exp/ remote  Chest CT  09/24/17 MPN's  Largest 4 x 6 mm R laterally  > rec f/u 09/14/18 (reminder file)   As pt reports neg CT 2015 (not avail)  - ESR 01/25/2018 = 35  - CT chest 07/11/18 =  5 mm > no f/u needed    Primary osteoarthritis of left hip 11/26/2019   Primary osteoarthritis of left knee 03/11/2020   Rectal pain    Sinus bradycardia 10/25/2018   Unilateral primary osteoarthritis, left knee 06/14/2018   Upper airway cough syndrome 01/25/2018   Onset 1980 CT head 12/22/17 neg sinus dz FENO 01/25/2018  =   21 - Allergy  profile 01/25/2018 >  Eos 0.1 /  IgE  17 RAST neg  - flare 02/12/18 p et  - gabapentin  100 mg tid 03/23/2018 > increased to max of 300 tid 04/24/2018 > improved so increased to 300 qid 06/05/2018 > d/c'd around 02/2019 s flare     Family History  Problem Relation Age of Onset   Diabetes Mother    Hypertension Mother    Thyroid  disease Mother    Vascular Disease Mother    Varicose Veins Mother    Lung cancer Father    Colon cancer Maternal Uncle    Kidney disease Sister    Colon polyps Neg Hx    Esophageal cancer Neg Hx    Stomach cancer Neg Hx    Rectal cancer Neg Hx    Breast cancer Neg Hx     Past Surgical History:  Procedure Laterality Date   ABDOMINAL HYSTERECTOMY  1997   ANAL RECTAL MANOMETRY N/A 08/09/2019   Procedure: ANO RECTAL MANOMETRY;  Surgeon: Eda Iha, MD;  Location: [facility];  Service: Gastroenterology;  Laterality: N/A;   BREAST SURGERY  2002   left cyst removal    CHOLECYSTECTOMY N/A 02/12/2018   Procedure: LAPAROSCOPIC CHOLECYSTECTOMY;  Surgeon: Debby Hila, MD;  Location: [facility];  Service: General;  Laterality: N/A;   COLONOSCOPY  07/12/2019   POLYPECTOMY     Social History   Occupational  History   Not on file  Tobacco Use   Smoking status: Never   Smokeless tobacco: Never  Vaping Use   Vaping status: Never Used  Substance and Sexual Activity   Alcohol use: Never   Drug use: Never   Sexual activity: Not Currently    Birth control/protection: Post-menopausal

## 2024-05-20 DIAGNOSIS — M170 Bilateral primary osteoarthritis of knee: Secondary | ICD-10-CM | POA: Diagnosis not present

## 2024-05-23 ENCOUNTER — Telehealth: Payer: Self-pay | Admitting: Orthopaedic Surgery

## 2024-05-23 NOTE — Telephone Encounter (Signed)
 Patient has chosen 06-06-24 at [facility] for her RIGHT KNEE PARTIAL LATERAL MENISCECTOMY with Dr. Jerri.  Post op is set for 06-14-24 at 8:30am with Morna.  Patient would like to discuss the recovery timeline.  She has questions related to the incision care and bandaging.  She would also like to know when she can take a shower and when she can drive.  She is most concerned about the stairs where she lives.  She lives on the 2nd floor and has 15 steps leading to her front door.  She is asking if she will be able to do these stairs right after the surgery or if she will need to recover elsewhere (maybe with a friend or relative) in order to avoid these steps.  Please call patient at [phone] to discuss her questions.  Depending on the answers, she may decide to push the surgery out in order to make these arrangements.

## 2024-05-24 NOTE — Telephone Encounter (Signed)
 Called her

## 2024-05-31 ENCOUNTER — Other Ambulatory Visit: Payer: Self-pay | Admitting: Family Medicine

## 2024-05-31 DIAGNOSIS — I10 Essential (primary) hypertension: Secondary | ICD-10-CM

## 2024-06-04 ENCOUNTER — Other Ambulatory Visit: Payer: Self-pay | Admitting: Physician Assistant

## 2024-06-04 MED ORDER — ONDANSETRON HCL 4 MG PO TABS: 4.0000 mg | ORAL_TABLET | Freq: Three times a day (TID) | ORAL | 0 refills | Status: AC | PRN

## 2024-06-04 MED ORDER — HYDROCODONE-ACETAMINOPHEN 5-325 MG PO TABS: 1.0000 | ORAL_TABLET | Freq: Three times a day (TID) | ORAL | 0 refills | Status: DC | PRN

## 2024-06-06 ENCOUNTER — Encounter: Payer: Self-pay | Admitting: Orthopaedic Surgery

## 2024-06-06 DIAGNOSIS — M2241 Chondromalacia patellae, right knee: Secondary | ICD-10-CM | POA: Diagnosis not present

## 2024-06-06 DIAGNOSIS — Y999 Unspecified external cause status: Secondary | ICD-10-CM | POA: Diagnosis not present

## 2024-06-06 DIAGNOSIS — M94261 Chondromalacia, right knee: Secondary | ICD-10-CM | POA: Diagnosis not present

## 2024-06-06 DIAGNOSIS — X58XXXA Exposure to other specified factors, initial encounter: Secondary | ICD-10-CM | POA: Diagnosis not present

## 2024-06-06 DIAGNOSIS — S83281A Other tear of lateral meniscus, current injury, right knee, initial encounter: Secondary | ICD-10-CM | POA: Diagnosis not present

## 2024-06-07 ENCOUNTER — Telehealth: Payer: Self-pay | Admitting: Orthopaedic Surgery

## 2024-06-07 NOTE — Telephone Encounter (Signed)
 Emailed exercises to [email or web address]

## 2024-06-07 NOTE — Telephone Encounter (Signed)
 Patient called and said she has instructions to exercise and the writing isn't clear. She wants to know if you can send a copy to her email or something. [phone]

## 2024-06-14 ENCOUNTER — Encounter: Payer: Self-pay | Admitting: Physician Assistant

## 2024-06-14 ENCOUNTER — Ambulatory Visit: Admitting: Physician Assistant

## 2024-06-14 ENCOUNTER — Ambulatory Visit (HOSPITAL_COMMUNITY)
Admission: RE | Admit: 2024-06-14 | Discharge: 2024-06-14 | Disposition: A | Discharge status: Home / Self Care | Source: Ambulatory Visit | Attending: Physician Assistant | Admitting: Physician Assistant

## 2024-06-14 DIAGNOSIS — Z9889 Other specified postprocedural states: Secondary | ICD-10-CM | POA: Diagnosis not present

## 2024-06-14 MED ORDER — HYDROCODONE-ACETAMINOPHEN 5-325 MG PO TABS: 1.0000 | ORAL_TABLET | Freq: Two times a day (BID) | ORAL | 0 refills | Status: AC | PRN

## 2024-06-14 NOTE — Progress Notes (Signed)
 Post-Op Visit Note   Patient: Nancy Christensen           Date of Birth: 1950-10-21           MRN: [id number] Visit Date: 06/14/2024 PCP: Mercer Clotilda SAUNDERS, MD   Assessment & Plan:  Chief Complaint:  Chief Complaint  Patient presents with   Right Knee - Follow-up    Right knee scope 06/06/2024   Visit Diagnoses:  1. S/P right knee arthroscopy     Plan: Patient is a pleasant 73 year old female who comes in today 1 week status post right knee arthroscopic debridement lateral meniscus and chondroplasty on 06/06/2024.  It was noted during operative intervention she had grade 3 changes to the weightbearing dome lateral femoral condyle.  She is in a fair amount of pain and is ambulating with a cane.  She has been taking hydrocodone .  She has been working on a home exercise program.  Examination of the right knee reveals well-healed surgical portals without complication.  She does have some ecchymosis to the medial thigh.  Calf is soft but moderately tender to the proximal aspect.  She has mild swelling to the right lower extremity.  She is neurovascularly intact distally.  Today, sutures removed and Steri-Strips applied.  Intraoperative pictures reviewed.  Home exercise program provided.  We have also discussed outpatient physical therapy for which the patient is interested in.  Referral has been made.  Due to her calf pain and concern for DVT, we will order an ultrasound to rule this out.  She will follow-up with us  in 5 weeks for repeat evaluation.  Call with concerns or questions.  Follow-Up Instructions: Return in about 5 weeks (around 07/19/2024).   Orders:  Orders Placed This Encounter  Procedures   VAS US  LOWER EXTREMITY VENOUS (DVT)   Meds ordered this encounter  Medications   HYDROcodone -acetaminophen  (NORCO/VICODIN) 5-325 MG tablet    Sig: Take 1 tablet by mouth 2 (two) times daily as needed.    Dispense:  14 tablet    Refill:  0    Imaging: No results found.  PMFS  History: Patient Active Problem List   Diagnosis Date Noted   Epistaxis 12/06/2023   Vitamin D  deficiency 06/27/2023   Puncture wound of toe of left foot 05/06/2022   Hypertension    Hyperlipidemia    Arthritis    Primary osteoarthritis of left knee 03/11/2020   Primary osteoarthritis of left hip 11/26/2019   Constipation due to outlet dysfunction    Rectal pain    Dizziness 10/25/2018   Sinus bradycardia 10/25/2018   Atypical chest pain 10/25/2018   Dyslipidemia 10/25/2018   Chronic right shoulder pain 07/04/2018   Essential hypertension 04/28/2018   Hypertensive disorder 04/18/2018   Cholecystitis with cholelithiasis 02/11/2018   Multiple pulmonary nodules determined by computed tomography of lung 01/26/2018   Upper airway cough syndrome 01/25/2018   Past Medical History:  Diagnosis Date   Arthritis 2019   left hip and knee   Atypical chest pain 10/25/2018   Cholecystitis with cholelithiasis 02/11/2018   Chronic right shoulder pain 07/04/2018   Constipation due to outlet dysfunction    Dizziness 10/25/2018   Dyslipidemia 10/25/2018   Essential hypertension 04/28/2018   Changed arb to CCB 04/24/2018 due to cough x one month trial    Hyperlipidemia    Hypertension    Hypertensive disorder 04/18/2018   Multiple pulmonary nodules determined by computed tomography of lung 01/26/2018   Passive smoke exp/ remote  Chest CT  09/24/17 MPN's  Largest 4 x 6 mm R laterally  > rec f/u 09/14/18 (reminder file)   As pt reports neg CT 2015 (not avail)  - ESR 01/25/2018 = 35  - CT chest 07/11/18 =  5 mm > no f/u needed    Primary osteoarthritis of left hip 11/26/2019   Primary osteoarthritis of left knee 03/11/2020   Rectal pain    Sinus bradycardia 10/25/2018   Unilateral primary osteoarthritis, left knee 06/14/2018   Upper airway cough syndrome 01/25/2018   Onset 1980 CT head 12/22/17 neg sinus dz FENO 01/25/2018  =   21 - Allergy  profile 01/25/2018 >  Eos 0.1 /  IgE  17 RAST neg  - flare  02/12/18 p et  - gabapentin  100 mg tid 03/23/2018 > increased to max of 300 tid 04/24/2018 > improved so increased to 300 qid 06/05/2018 > d/c'd around 02/2019 s flare     Family History  Problem Relation Age of Onset   Diabetes Mother    Hypertension Mother    Thyroid  disease Mother    Vascular Disease Mother    Varicose Veins Mother    Lung cancer Father    Colon cancer Maternal Uncle    Kidney disease Sister    Colon polyps Neg Hx    Esophageal cancer Neg Hx    Stomach cancer Neg Hx    Rectal cancer Neg Hx    Breast cancer Neg Hx     Past Surgical History:  Procedure Laterality Date   ABDOMINAL HYSTERECTOMY  1997   ANAL RECTAL MANOMETRY N/A 08/09/2019   Procedure: ANO RECTAL MANOMETRY;  Surgeon: Eda Iha, MD;  Location: [facility];  Service: Gastroenterology;  Laterality: N/A;   BREAST SURGERY  2002   left cyst removal    CHOLECYSTECTOMY N/A 02/12/2018   Procedure: LAPAROSCOPIC CHOLECYSTECTOMY;  Surgeon: Debby Hila, MD;  Location: [facility];  Service: General;  Laterality: N/A;   COLONOSCOPY  07/12/2019   POLYPECTOMY     Social History   Occupational History   Not on file  Tobacco Use   Smoking status: Never   Smokeless tobacco: Never  Vaping Use   Vaping status: Never Used  Substance and Sexual Activity   Alcohol use: Never   Drug use: Never   Sexual activity: Not Currently    Birth control/protection: Post-menopausal

## 2024-06-14 NOTE — Addendum Note (Signed)
 Addended by: JULE RONAL CROME on: 06/14/2024 09:05 AM   Modules accepted: Orders

## 2024-06-17 DIAGNOSIS — M170 Bilateral primary osteoarthritis of knee: Secondary | ICD-10-CM | POA: Diagnosis not present

## 2024-06-17 NOTE — Therapy (Signed)
 OUTPATIENT PHYSICAL THERAPY LOWER EXTREMITY EVALUATION   Patient Name: Nancy Christensen MRN: [id number] DOB:December 30, 1950, 73 y.o., female Today's Date: 06/18/2024  END OF SESSION:  PT End of Session - 06/18/24 1256     Visit Number 1    Number of Visits 17    Date for Recertification  08/13/24    PT Start Time 0930    PT Stop Time 1010    PT Time Calculation (min) 40 min    Activity Tolerance Patient tolerated treatment well    Behavior During Therapy [facility] for tasks assessed/performed          Past Medical History:  Diagnosis Date   Arthritis 2019   left hip and knee   Atypical chest pain 10/25/2018   Cholecystitis with cholelithiasis 02/11/2018   Chronic right shoulder pain 07/04/2018   Constipation due to outlet dysfunction    Dizziness 10/25/2018   Dyslipidemia 10/25/2018   Essential hypertension 04/28/2018   Changed arb to CCB 04/24/2018 due to cough x one month trial    Hyperlipidemia    Hypertension    Hypertensive disorder 04/18/2018   Multiple pulmonary nodules determined by computed tomography of lung 01/26/2018   Passive smoke exp/ remote  Chest CT  09/24/17 MPN's  Largest 4 x 6 mm R laterally  > rec f/u 09/14/18 (reminder file)   As pt reports neg CT 2015 (not avail)  - ESR 01/25/2018 = 35  - CT chest 07/11/18 =  5 mm > no f/u needed    Primary osteoarthritis of left hip 11/26/2019   Primary osteoarthritis of left knee 03/11/2020   Rectal pain    Sinus bradycardia 10/25/2018   Unilateral primary osteoarthritis, left knee 06/14/2018   Upper airway cough syndrome 01/25/2018   Onset 1980 CT head 12/22/17 neg sinus dz FENO 01/25/2018  =   21 - Allergy  profile 01/25/2018 >  Eos 0.1 /  IgE  17 RAST neg  - flare 02/12/18 p et  - gabapentin  100 mg tid 03/23/2018 > increased to max of 300 tid 04/24/2018 > improved so increased to 300 qid 06/05/2018 > d/c'd around 02/2019 s flare    Past Surgical History:  Procedure Laterality Date   ABDOMINAL HYSTERECTOMY  1997   ANAL RECTAL  MANOMETRY N/A 08/09/2019   Procedure: ANO RECTAL MANOMETRY;  Surgeon: Eda Iha, MD;  Location: [facility];  Service: Gastroenterology;  Laterality: N/A;   BREAST SURGERY  2002   left cyst removal    CHOLECYSTECTOMY N/A 02/12/2018   Procedure: LAPAROSCOPIC CHOLECYSTECTOMY;  Surgeon: Debby Hila, MD;  Location: [facility];  Service: General;  Laterality: N/A;   COLONOSCOPY  07/12/2019   POLYPECTOMY     Patient Active Problem List   Diagnosis Date Noted   Epistaxis 12/06/2023   Vitamin D  deficiency 06/27/2023   Puncture wound of toe of left foot 05/06/2022   Hypertension    Hyperlipidemia    Arthritis    Primary osteoarthritis of left knee 03/11/2020   Primary osteoarthritis of left hip 11/26/2019   Constipation due to outlet dysfunction    Rectal pain    Dizziness 10/25/2018   Sinus bradycardia 10/25/2018   Atypical chest pain 10/25/2018   Dyslipidemia 10/25/2018   Chronic right shoulder pain 07/04/2018   Essential hypertension 04/28/2018   Hypertensive disorder 04/18/2018   Cholecystitis with cholelithiasis 02/11/2018   Multiple pulmonary nodules determined by computed tomography of lung 01/26/2018   Upper airway cough syndrome 01/25/2018    PCP: Mercer Clotilda SAUNDERS,  MD   REFERRING PROVIDER: Jule Shuck PA   REFERRING DIAG: [phone] (ICD-10-CM) - S/P right knee arthroscopy 06/06/24  THERAPY DIAG:  Chronic pain of right knee  Stiffness of right knee, not elsewhere classified  Localized edema  Altered gait  Rationale for Evaluation and Treatment: Rehabilitation  ONSET DATE: 04/25/24  SUBJECTIVE:   SUBJECTIVE STATEMENT: Pt states occasional R knee weakness.  04/25/24 she was stepping onto a train step and the knee gave out.  She was in [place] .  She went to [facility] and then orthopedist.  She had xray and MRI which was + for torn meniscus.  She followed up with Dr Jerri and had surgery on 06/06/24.  PERTINENT HISTORY: R knee gel injections in the past but no  surgery PAIN:  Are you having pain? Yes: NPRS scale: 7/10 Pain location: lateral and posterior Pain description: deep ache Aggravating factors: standing, lifting R LE Relieving factors: ice, meds  PRECAUTIONS: None  RED FLAGS: None   WEIGHT BEARING RESTRICTIONS: No  FALLS:  Has patient fallen in last 6 months? No  LIVING ENVIRONMENT: Lives with: lives alone Lives in: House/apartment Stairs: Yes: External: 12 steps; bilateral but cannot reach both Has following equipment at home: Single point cane  OCCUPATION: PT requires standing   PLOF: Independent  PATIENT GOALS: decrease pain, full activity  NEXT MD VISIT: unknown  OBJECTIVE:  Note: Objective measures were completed at Evaluation unless otherwise noted.  DIAGNOSTIC FINDINGS: 03/25/24  IMPRESSION: Slight degenerative changes.  PATIENT SURVEYS:  PSFS: THE PATIENT SPECIFIC FUNCTIONAL SCALE  Place score of 0-10 (0 = unable to perform activity and 10 = able to perform activity at the same level as before injury or problem)  Activity Date: 06/17/24    [facility] knee like for sitting 5    2.full weight bearing/walking 8    3.lifting leg to ascend stairs 5    4.      Total Score 6      Total Score = Sum of activity scores/number of activities  Minimally Detectable Change: 3 points (for single activity); 2 points (for average score)  [facility] (nd). The Patient Specific Functional Scale . Retrieved from [email or web address]   COGNITION: Overall cognitive status: Within functional limits for tasks assessed     SENSATION: WFL  EDEMA:  Circumferential: --  MUSCLE LENGTH: Hamstrings: Right mod restriction deg  POSTURE: flexed trunk   PALPATION: 2+ tenderness joint line  LOWER EXTREMITY ROM:  Active/Passive ROM Right eval Left eval  Hip flexion    Hip extension    Hip abduction    Hip adduction    Hip internal rotation    Hip external  rotation    Knee flexion 90/102   Knee extension +4/0   Ankle dorsiflexion    Ankle plantarflexion    Ankle inversion    Ankle eversion     (Blank rows = not tested)  LOWER EXTREMITY MMT:  MMT Right eval Left eval  Hip flexion 4   Hip extension    Hip abduction    Hip adduction    Hip internal rotation    Hip external rotation    Knee flexion 4+   Knee extension 4-   Ankle dorsiflexion 4   Ankle plantarflexion    Ankle inversion    Ankle eversion     (Blank rows = not tested)  LOWER EXTREMITY SPECIAL TESTS:  No special tests completed due to post op status  FUNCTIONAL TESTS:  5 times sit  to stand: 23 sec  GAIT: Distance walked: 100 Assistive device utilized: Single point cane Level of assistance: Complete Independence Comments: Pt needed VC for step through pattern and cane use in proper L hand.                                                                                                                                TREATMENT DATE:  06/17/24 Initial evaluation completed followed by instruction and trial set of HEP and gait training with SPC.  Stair pattern reviewed verbally.   PATIENT EDUCATION:  Education details: HEP Person educated: Patient Education method: Explanation, Demonstration, Actor cues, and Verbal cues Education comprehension: verbalized understanding, returned demonstration, and verbal cues required  HOME EXERCISE PROGRAM: Access Code: [email or web address] URL: [email or web address] Date: 06/18/2024 Prepared by: Burnard Meth  Exercises - Supine Heel Slide  - 2 x daily - 7 x weekly - 2 sets - 10 reps - Supine Quad Set  - 2 x daily - 7 x weekly - 2 sets - 10 reps - 2 hold - Supine Ankle Pumps  - 2 x daily - 7 x weekly - 3 sets - 10 reps - Hooklying Hamstring Stretch with Strap  - 2 x daily - 7 x weekly - 2 sets - 10 reps - 25 hold  ASSESSMENT:  CLINICAL IMPRESSION: Patient is a 73 y.o. female who was seen today for physical therapy  evaluation and treatment for R knee s/p arthroscopy.  She presents with decreased ROM, decreased strength, pain, altered gait and edema.  OBJECTIVE IMPAIRMENTS: Abnormal gait, decreased endurance, decreased mobility, difficulty walking, decreased ROM, decreased strength, increased edema, impaired flexibility, and pain.   ACTIVITY LIMITATIONS: sitting, standing, squatting, stairs, and transfers  PARTICIPATION LIMITATIONS: meal prep, driving, community activity, and occupation  PERSONAL FACTORS: Age are also affecting patient's functional outcome.   REHAB POTENTIAL: Good  CLINICAL DECISION MAKING: Stable/uncomplicated  EVALUATION COMPLEXITY: Moderate   GOALS: Goals reviewed with patient? Yes  SHORT TERM GOALS: Target date: 07/16/2024   Pt to be independent with HEP. Baseline: Goal status: INITIAL  2.  Decrease pain by 1 level. Baseline:  Goal status: INITIAL  3.  Pt able to phase out cane. Baseline:  Goal status: INITIAL  LONG TERM GOALS: Target date: 08/13/2024      Pt to be independent with self progressive HEP by time of discharge. Baseline:  Goal status: INITIAL  2.  Increase AROM to [facility] by discharge. Baseline: 4>90 Goal status: INITIAL  3.  Increase strength to at least 4+/5 by discharge. Baseline: 4-/5 Goal status: INITIAL  4.  Max pain to 2/10 or less with all activities. Baseline: 7/10 Goal status: INITIAL  5.  Normalize gait without AD. Baseline:  Goal status: INITIAL  6.  Pt able to ambulate community distances without pain or AD. Baseline:  Goal status: INITIAL   PLAN:  PT FREQUENCY: 2x/week  PT DURATION: 8 weeks  PLANNED INTERVENTIONS: [id number]- PT  Re-evaluation, 97110-Therapeutic exercises, 97530- Therapeutic activity, [id number]- Neuromuscular re-education, [phone]- Self Care, 02859- Manual therapy, [phone]- Gait training, [phone]- Aquatic Therapy, [phone]- Electrical stimulation (unattended), [id number]- Vasopneumatic device, Patient/Family education,  Balance training, Stair training, Joint mobilization, Cryotherapy, and Moist heat  PLAN FOR NEXT SESSION: Measure circumference   Burnard CHRISTELLA Meth, PT 06/18/2024, 12:58 PM

## 2024-06-18 ENCOUNTER — Ambulatory Visit (INDEPENDENT_AMBULATORY_CARE_PROVIDER_SITE_OTHER)

## 2024-06-18 ENCOUNTER — Other Ambulatory Visit: Payer: Self-pay

## 2024-06-18 ENCOUNTER — Telehealth: Payer: Self-pay

## 2024-06-18 DIAGNOSIS — M25661 Stiffness of right knee, not elsewhere classified: Secondary | ICD-10-CM | POA: Diagnosis not present

## 2024-06-18 DIAGNOSIS — R269 Unspecified abnormalities of gait and mobility: Secondary | ICD-10-CM | POA: Diagnosis not present

## 2024-06-18 DIAGNOSIS — G8929 Other chronic pain: Secondary | ICD-10-CM | POA: Diagnosis not present

## 2024-06-18 DIAGNOSIS — M25561 Pain in right knee: Secondary | ICD-10-CM

## 2024-06-18 DIAGNOSIS — R600 Localized edema: Secondary | ICD-10-CM | POA: Diagnosis not present

## 2024-06-18 NOTE — Telephone Encounter (Signed)
 Pt was eval at [facility] rehab, pt wants to transfer to [facility], per message received from [facility]. I left a message for the patient to call [facility].

## 2024-06-20 ENCOUNTER — Ambulatory Visit (INDEPENDENT_AMBULATORY_CARE_PROVIDER_SITE_OTHER)

## 2024-06-20 DIAGNOSIS — R269 Unspecified abnormalities of gait and mobility: Secondary | ICD-10-CM | POA: Diagnosis not present

## 2024-06-20 DIAGNOSIS — M25561 Pain in right knee: Secondary | ICD-10-CM | POA: Diagnosis not present

## 2024-06-20 DIAGNOSIS — R600 Localized edema: Secondary | ICD-10-CM | POA: Diagnosis not present

## 2024-06-20 DIAGNOSIS — M25661 Stiffness of right knee, not elsewhere classified: Secondary | ICD-10-CM

## 2024-06-20 DIAGNOSIS — G8929 Other chronic pain: Secondary | ICD-10-CM | POA: Diagnosis not present

## 2024-06-20 NOTE — Therapy (Signed)
 OUTPATIENT PHYSICAL THERAPY LOWER EXTREMITY TREATMENT   Patient Name: Nancy Christensen MRN: [id number] DOB:[birth year], 73 y.o., female Today's Date: 06/20/2024  END OF SESSION:  PT End of Session - 06/20/24 1401     Visit Number 2    Number of Visits 17    Date for Recertification  08/13/24    PT Start Time 1320    PT Stop Time 1358    PT Time Calculation (min) 38 min    Activity Tolerance Patient tolerated treatment well    Behavior During Therapy [facility] for tasks assessed/performed           Past Medical History:  Diagnosis Date   Arthritis 2019   left hip and knee   Atypical chest pain 10/25/2018   Cholecystitis with cholelithiasis 02/11/2018   Chronic right shoulder pain 07/04/2018   Constipation due to outlet dysfunction    Dizziness 10/25/2018   Dyslipidemia 10/25/2018   Essential hypertension 04/28/2018   Changed arb to CCB 04/24/2018 due to cough x one month trial    Hyperlipidemia    Hypertension    Hypertensive disorder 04/18/2018   Multiple pulmonary nodules determined by computed tomography of lung 01/26/2018   Passive smoke exp/ remote  Chest CT  09/24/17 MPN's  Largest 4 x 6 mm R laterally  > rec f/u 09/14/18 (reminder file)   As pt reports neg CT 2015 (not avail)  - ESR 01/25/2018 = 35  - CT chest 07/11/18 =  5 mm > no f/u needed    Primary osteoarthritis of left hip 11/26/2019   Primary osteoarthritis of left knee 03/11/2020   Rectal pain    Sinus bradycardia 10/25/2018   Unilateral primary osteoarthritis, left knee 06/14/2018   Upper airway cough syndrome 01/25/2018   Onset 1980 CT head 12/22/17 neg sinus dz FENO 01/25/2018  =   21 - Allergy  profile 01/25/2018 >  Eos 0.1 /  IgE  17 RAST neg  - flare 02/12/18 p et  - gabapentin  100 mg tid 03/23/2018 > increased to max of 300 tid 04/24/2018 > improved so increased to 300 qid 06/05/2018 > d/c'd around 02/2019 s flare    Past Surgical History:  Procedure Laterality Date   ABDOMINAL HYSTERECTOMY  1997   ANAL RECTAL  MANOMETRY N/A 08/09/2019   Procedure: ANO RECTAL MANOMETRY;  Surgeon: Eda Iha, MD;  Location: [facility];  Service: Gastroenterology;  Laterality: N/A;   BREAST SURGERY  2002   left cyst removal    CHOLECYSTECTOMY N/A 02/12/2018   Procedure: LAPAROSCOPIC CHOLECYSTECTOMY;  Surgeon: Debby Hila, MD;  Location: [facility];  Service: General;  Laterality: N/A;   COLONOSCOPY  07/12/2019   POLYPECTOMY     Patient Active Problem List   Diagnosis Date Noted   Epistaxis 12/06/2023   Vitamin D  deficiency 06/27/2023   Puncture wound of toe of left foot 05/06/2022   Hypertension    Hyperlipidemia    Arthritis    Primary osteoarthritis of left knee 03/11/2020   Primary osteoarthritis of left hip 11/26/2019   Constipation due to outlet dysfunction    Rectal pain    Dizziness 10/25/2018   Sinus bradycardia 10/25/2018   Atypical chest pain 10/25/2018   Dyslipidemia 10/25/2018   Chronic right shoulder pain 07/04/2018   Essential hypertension 04/28/2018   Hypertensive disorder 04/18/2018   Cholecystitis with cholelithiasis 02/11/2018   Multiple pulmonary nodules determined by computed tomography of lung 01/26/2018   Upper airway cough syndrome 01/25/2018    PCP: Mercer Kirsch  JONELLE, MD   REFERRING PROVIDER: Jule Shuck PA   REFERRING DIAG: [phone] (ICD-10-CM) - S/P right knee arthroscopy 06/06/24  THERAPY DIAG:  Chronic pain of right knee  Stiffness of right knee, not elsewhere classified  Localized edema  Altered gait  Rationale for Evaluation and Treatment: Rehabilitation  ONSET DATE: 04/25/24  SUBJECTIVE:   SUBJECTIVE STATEMENT: Pt reports doing HEP and no problems.  Eval-Pt states occasional R knee weakness.  04/25/24 she was stepping onto a train step and the knee gave out.  She was in [place] .  She went to UC and then orthopedist.  She had xray and MRI which was + for torn meniscus.  She followed up with Dr Jerri and had surgery on 06/06/24.  PERTINENT  HISTORY: R knee gel injections in the past but no surgery PAIN:  Are you having pain? Yes: NPRS scale: 4/10 Pain location: lateral and posterior Pain description: deep ache Aggravating factors: standing, lifting R LE Relieving factors: ice, meds  PRECAUTIONS: None  RED FLAGS: None   WEIGHT BEARING RESTRICTIONS: No  FALLS:  Has patient fallen in last 6 months? No  LIVING ENVIRONMENT: Lives with: lives alone Lives in: House/apartment Stairs: Yes: External: 12 steps; bilateral but cannot reach both Has following equipment at home: Single point cane  OCCUPATION: PT requires standing   PLOF: Independent  PATIENT GOALS: decrease pain, full activity  NEXT MD VISIT: unknown  OBJECTIVE:  Note: Objective measures were completed at Evaluation unless otherwise noted.  DIAGNOSTIC FINDINGS: 03/25/24  IMPRESSION: Slight degenerative changes.  PATIENT SURVEYS:  PSFS: THE PATIENT SPECIFIC FUNCTIONAL SCALE  Place score of 0-10 (0 = unable to perform activity and 10 = able to perform activity at the same level as before injury or problem)  Activity Date: 06/17/24    [facility] knee like for sitting 5    2.full weight bearing/walking 8    3.lifting leg to ascend stairs 5    4.      Total Score 6      Total Score = Sum of activity scores/number of activities  Minimally Detectable Change: 3 points (for single activity); 2 points (for average score)  [facility] (nd). The Patient Specific Functional Scale . Retrieved from [email or web address]   COGNITION: Overall cognitive status: Within functional limits for tasks assessed     SENSATION: WFL  EDEMA:  Circumferential: --50.5cm  MUSCLE LENGTH: Hamstrings: Right mod restriction deg  POSTURE: flexed trunk   PALPATION: 2+ tenderness joint line  LOWER EXTREMITY ROM:  Active/Passive ROM Right eval Left eval  Hip flexion    Hip extension    Hip abduction     Hip adduction    Hip internal rotation    Hip external rotation    Knee flexion 90/102   Knee extension +4/0   Ankle dorsiflexion    Ankle plantarflexion    Ankle inversion    Ankle eversion     (Blank rows = not tested)  LOWER EXTREMITY MMT:  MMT Right eval Left eval  Hip flexion 4   Hip extension    Hip abduction    Hip adduction    Hip internal rotation    Hip external rotation    Knee flexion 4+   Knee extension 4-   Ankle dorsiflexion 4   Ankle plantarflexion    Ankle inversion    Ankle eversion     (Blank rows = not tested)  LOWER EXTREMITY SPECIAL TESTS:  No special tests completed due to post  op status  FUNCTIONAL TESTS:  5 times sit to stand: 23 sec  GAIT: Distance walked: 100 Assistive device utilized: Single point cane Level of assistance: Complete Independence Comments: Pt needed VC for step through pattern and cane use in proper L hand.                                                                                                                                TREATMENT DATE: R knee 06/20/24 HEP review Nustep 6 min level 3 Incline board stretch 3x30 sec LAQ 3x10 Quad sets 2x10 SLR/SR 3x10 Gait training with SPC and then stair training using rail on the R side for ascending and descending external stairs at apartment.   06/17/24 Initial evaluation completed followed by instruction and trial set of HEP and gait training with SPC.  Stair pattern reviewed verbally.   PATIENT EDUCATION:  Education details: HEP Person educated: Patient Education method: Explanation, Demonstration, Actor cues, and Verbal cues Education comprehension: verbalized understanding, returned demonstration, and verbal cues required  HOME EXERCISE PROGRAM:  Access Code: [email or web address] URL: [email or web address] Date: 06/20/2024 Prepared by: Burnard Meth  Exercises - Supine Heel Slide  - 2 x daily - 7 x weekly - 2 sets - 10 reps - Supine Quad Set  - 2 x  daily - 7 x weekly - 2 sets - 10 reps - 2 hold - Supine Ankle Pumps  - 2 x daily - 7 x weekly - 3 sets - 10 reps - Hooklying Hamstring Stretch with Strap  - 2 x daily - 7 x weekly - 2 sets - 10 reps - 25 hold - Seated Long Arc Quad  - 2 x daily - 7 x weekly - 2 sets - 10 reps - Supine Active Straight Leg Raise  - 2 x daily - 7 x weekly - 2 sets - 10 reps ASSESSMENT:  CLINICAL IMPRESSION: Patient needed VC for stair training.  Able to alternate with no pain with ascending stairs but more comfortable with step to pattern with descending.  OBJECTIVE IMPAIRMENTS: Abnormal gait, decreased endurance, decreased mobility, difficulty walking, decreased ROM, decreased strength, increased edema, impaired flexibility, and pain.   ACTIVITY LIMITATIONS: sitting, standing, squatting, stairs, and transfers  PARTICIPATION LIMITATIONS: meal prep, driving, community activity, and occupation  PERSONAL FACTORS: Age are also affecting patient's functional outcome.   REHAB POTENTIAL: Good  CLINICAL DECISION MAKING: Stable/uncomplicated  EVALUATION COMPLEXITY: Moderate   GOALS: Goals reviewed with patient? Yes  SHORT TERM GOALS: Target date: 07/16/2024   Pt to be independent with HEP. Baseline: Goal status: INITIAL  2.  Decrease pain by 1 level. Baseline:  Goal status: INITIAL  3.  Pt able to phase out cane. Baseline:  Goal status: INITIAL  LONG TERM GOALS: Target date: 08/13/2024      Pt to be independent with self progressive HEP by time of discharge. Baseline:  Goal status: INITIAL  2.  Increase AROM to WFL by  discharge. Baseline: 4>90 Goal status: INITIAL  3.  Increase strength to at least 4+/5 by discharge. Baseline: 4-/5 Goal status: INITIAL  4.  Max pain to 2/10 or less with all activities. Baseline: 7/10 Goal status: INITIAL  5.  Normalize gait without AD. Baseline:  Goal status: INITIAL  6.  Pt able to ambulate community distances without pain or AD. Baseline:   Goal status: INITIAL   PLAN:  PT FREQUENCY: 2x/week  PT DURATION: 8 weeks  PLANNED INTERVENTIONS: 97164- PT Re-evaluation, 97110-Therapeutic exercises, 97530- Therapeutic activity, [id number]- Neuromuscular re-education, 97535- Self Care, 02859- Manual therapy, [phone]- Gait training, [phone]- Aquatic Therapy, [phone]- Electrical stimulation (unattended), 97016- Vasopneumatic device, Patient/Family education, Balance training, Stair training, Joint mobilization, Cryotherapy, and Moist heat  PLAN FOR NEXT SESSION: Continue with ROM and strengthening exercise.   Burnard CHRISTELLA Meth, PT 06/20/2024, 2:04 PM

## 2024-06-21 ENCOUNTER — Telehealth: Payer: Self-pay | Admitting: Orthopaedic Surgery

## 2024-06-21 NOTE — Therapy (Signed)
 OUTPATIENT PHYSICAL THERAPY LOWER EXTREMITY TREATMENT   Patient Name: Nancy Christensen MRN: [id number] DOB:[birth year], 73 y.o., female Today's Date: 06/24/2024  END OF SESSION:  PT End of Session - 06/24/24 0933     Visit Number 3    Number of Visits 17    Date for Recertification  08/13/24    Authorization Type MCR    Progress Note Due on Visit 10    PT Start Time 0933    PT Stop Time 1024    PT Time Calculation (min) 51 min    Activity Tolerance Patient tolerated treatment well    Behavior During Therapy [facility] for tasks assessed/performed            Past Medical History:  Diagnosis Date   Arthritis 2019   left hip and knee   Atypical chest pain 10/25/2018   Cholecystitis with cholelithiasis 02/11/2018   Chronic right shoulder pain 07/04/2018   Constipation due to outlet dysfunction    Dizziness 10/25/2018   Dyslipidemia 10/25/2018   Essential hypertension 04/28/2018   Changed arb to CCB 04/24/2018 due to cough x one month trial    Hyperlipidemia    Hypertension    Hypertensive disorder 04/18/2018   Multiple pulmonary nodules determined by computed tomography of lung 01/26/2018   Passive smoke exp/ remote  Chest CT  09/24/17 MPN's  Largest 4 x 6 mm R laterally  > rec f/u 09/14/18 (reminder file)   As pt reports neg CT 2015 (not avail)  - ESR 01/25/2018 = 35  - CT chest 07/11/18 =  5 mm > no f/u needed    Primary osteoarthritis of left hip 11/26/2019   Primary osteoarthritis of left knee 03/11/2020   Rectal pain    Sinus bradycardia 10/25/2018   Unilateral primary osteoarthritis, left knee 06/14/2018   Upper airway cough syndrome 01/25/2018   Onset 1980 CT head 12/22/17 neg sinus dz FENO 01/25/2018  =   21 - Allergy  profile 01/25/2018 >  Eos 0.1 /  IgE  17 RAST neg  - flare 02/12/18 p et  - gabapentin  100 mg tid 03/23/2018 > increased to max of 300 tid 04/24/2018 > improved so increased to 300 qid 06/05/2018 > d/c'd around 02/2019 s flare    Past Surgical History:  Procedure  Laterality Date   ABDOMINAL HYSTERECTOMY  1997   ANAL RECTAL MANOMETRY N/A 08/09/2019   Procedure: ANO RECTAL MANOMETRY;  Surgeon: Eda Iha, MD;  Location: [facility];  Service: Gastroenterology;  Laterality: N/A;   BREAST SURGERY  2002   left cyst removal    CHOLECYSTECTOMY N/A 02/12/2018   Procedure: LAPAROSCOPIC CHOLECYSTECTOMY;  Surgeon: Debby Hila, MD;  Location: [facility];  Service: General;  Laterality: N/A;   COLONOSCOPY  07/12/2019   POLYPECTOMY     Patient Active Problem List   Diagnosis Date Noted   Epistaxis 12/06/2023   Vitamin D  deficiency 06/27/2023   Puncture wound of toe of left foot 05/06/2022   Hypertension    Hyperlipidemia    Arthritis    Primary osteoarthritis of left knee 03/11/2020   Primary osteoarthritis of left hip 11/26/2019   Constipation due to outlet dysfunction    Rectal pain    Dizziness 10/25/2018   Sinus bradycardia 10/25/2018   Atypical chest pain 10/25/2018   Dyslipidemia 10/25/2018   Chronic right shoulder pain 07/04/2018   Essential hypertension 04/28/2018   Hypertensive disorder 04/18/2018   Cholecystitis with cholelithiasis 02/11/2018   Multiple pulmonary nodules determined by computed tomography  of lung 01/26/2018   Upper airway cough syndrome 01/25/2018    PCP: Mercer Clotilda SAUNDERS, MD   REFERRING PROVIDER: Jule Shuck PA   REFERRING DIAG: [phone] (ICD-10-CM) - S/P right knee arthroscopy 06/06/24  THERAPY DIAG:  Chronic pain of right knee  Stiffness of right knee, not elsewhere classified  Localized edema  Altered gait  Rationale for Evaluation and Treatment: Rehabilitation  ONSET DATE: 04/25/24; S/P right knee arthroscopy 06/06/24  SUBJECTIVE:   SUBJECTIVE STATEMENT: She has arthritis on R side. On Friday I had a sharp pain medially. Spoke to Dr.'s   Eval-Pt states occasional R knee weakness.  04/25/24 she was stepping onto a train step and the knee gave out.  She was in [place] .  She went to UC and then  orthopedist.  She had xray and MRI which was + for torn meniscus.  She followed up with Dr Jerri and had surgery on 06/06/24.  PERTINENT HISTORY: R knee gel injections in the past but no surgery PAIN:  Are you having pain? Yes: NPRS scale: 4/10 Pain location: lateral and posterior Pain description: deep ache Aggravating factors: standing, lifting R LE Relieving factors: ice, meds  PRECAUTIONS: None  RED FLAGS: None   WEIGHT BEARING RESTRICTIONS: No  FALLS:  Has patient fallen in last 6 months? No  LIVING ENVIRONMENT: Lives with: lives alone Lives in: House/apartment Stairs: Yes: External: 12 steps; bilateral but cannot reach both Has following equipment at home: Single point cane  OCCUPATION: PT requires standing   PLOF: Independent  PATIENT GOALS: decrease pain, full activity  NEXT MD VISIT: 5 wks  OBJECTIVE:  Note: Objective measures were completed at Evaluation unless otherwise noted.  DIAGNOSTIC FINDINGS: 03/25/24  IMPRESSION: Slight degenerative changes.  PATIENT SURVEYS:  PSFS: THE PATIENT SPECIFIC FUNCTIONAL SCALE  Place score of 0-10 (0 = unable to perform activity and 10 = able to perform activity at the same level as before injury or problem)  Activity Date: 06/17/24    [facility] knee like for sitting 5    2.full weight bearing/walking 8    3.lifting leg to ascend stairs 5    4.      Total Score 6      Total Score = Sum of activity scores/number of activities  Minimally Detectable Change: 3 points (for single activity); 2 points (for average score)  [employer] [facility] (nd). The Patient Specific Functional Scale . Retrieved from [email or web address]   COGNITION: Overall cognitive status: Within functional limits for tasks assessed     SENSATION: WFL  EDEMA:  Circumferential: --50.5cm  MUSCLE LENGTH: Hamstrings: Right mod restriction deg  POSTURE: flexed trunk   PALPATION: 2+ tenderness  joint line  LOWER EXTREMITY ROM:  Active/Passive ROM Right eval Left eval Right 06/24/24  Hip flexion     Hip extension     Hip abduction     Hip adduction     Hip internal rotation     Hip external rotation     Knee flexion 90/102  113  Knee extension +4/0  0  Ankle dorsiflexion     Ankle plantarflexion     Ankle inversion     Ankle eversion      (Blank rows = not tested)  LOWER EXTREMITY MMT:  MMT Right eval Left eval  Hip flexion 4   Hip extension    Hip abduction    Hip adduction    Hip internal rotation    Hip external rotation    Knee flexion  4+   Knee extension 4-   Ankle dorsiflexion 4   Ankle plantarflexion    Ankle inversion    Ankle eversion     (Blank rows = not tested)  LOWER EXTREMITY SPECIAL TESTS:  No special tests completed due to post op status  FUNCTIONAL TESTS:  5 times sit to stand: 23 sec  GAIT: Distance walked: 100 Assistive device utilized: Single point cane Level of assistance: Complete Independence Comments: Pt needed VC for step through pattern and cane use in proper L hand.                                                                                                                                TREATMENT DATE: R knee 06/24/24 Nustep 7.5 min level 5  Incline board stretch 3x30 sec LAQ 3x10 Quad sets 3x10 SLR 3x10 cues for eccentric control at end range Hip ABD 2x 10 B Hip ADD 2x10 B S/L clam x 20 B Checked ROM Ice x 10 min to post knee at end of session   06/20/24 HEP review Nustep 6 min level 3 Incline board stretch 3x30 sec LAQ 3x10 Quad sets 2x10 SLR/SR 3x10 Gait training with SPC and then stair training using rail on the R side for ascending and descending external stairs at apartment.   06/17/24 Initial evaluation completed followed by instruction and trial set of HEP and gait training with SPC.  Stair pattern reviewed verbally.   PATIENT EDUCATION:  Education details: HEP Person educated:  Patient Education method: Explanation, Demonstration, Actor cues, and Verbal cues Education comprehension: verbalized understanding, returned demonstration, and verbal cues required  HOME EXERCISE PROGRAM:  Access Code: [email or web address] URL: [email or web address] Date: 06/24/2024 Prepared by: Mliss  Exercises - Supine Heel Slide  - 2 x daily - 7 x weekly - 2 sets - 10 reps - Supine Quad Set  - 2 x daily - 7 x weekly - 2 sets - 10 reps - 2 hold - Supine Ankle Pumps  - 2 x daily - 7 x weekly - 3 sets - 10 reps - Hooklying Hamstring Stretch with Strap  - 2 x daily - 7 x weekly - 2 sets - 10 reps - 25 hold - Seated Long Arc Quad  - 2 x daily - 7 x weekly - 2 sets - 10 reps - Supine Active Straight Leg Raise  - 2 x daily - 7 x weekly - 2 sets - 10 reps - Sidelying Hip Abduction  - 1 x daily - 3 x weekly - 2 sets - 10 reps - Sidelying Hip Adduction  - 1 x daily - 3 x weekly - 2 sets - 10 reps ASSESSMENT:  CLINICAL IMPRESSION: Patient presents for first visit at this clinic. She reports compliance with HEP and mild pain today. She lives in an apartment and must climb  2 flights to apartment. She demonstrated good tolerance to all exercises today. HEP updated. Her ROM  has improved considerably since eval. She continues to demonstrate potential for improvement and would benefit from continued skilled therapy to address impairments.    OBJECTIVE IMPAIRMENTS: Abnormal gait, decreased endurance, decreased mobility, difficulty walking, decreased ROM, decreased strength, increased edema, impaired flexibility, and pain.   ACTIVITY LIMITATIONS: sitting, standing, squatting, stairs, and transfers  PARTICIPATION LIMITATIONS: meal prep, driving, community activity, and occupation  PERSONAL FACTORS: Age are also affecting patient's functional outcome.   REHAB POTENTIAL: Good  CLINICAL DECISION MAKING: Stable/uncomplicated  EVALUATION COMPLEXITY: Moderate   GOALS: Goals reviewed with  patient? Yes  SHORT TERM GOALS: Target date: 07/16/2024   Pt to be independent with HEP. Baseline: Goal status: INITIAL  2.  Decrease pain by 1 level. Baseline:  Goal status: INITIAL  3.  Pt able to phase out cane. Baseline:  Goal status: INITIAL  LONG TERM GOALS: Target date: 08/13/2024      Pt to be independent with self progressive HEP by time of discharge. Baseline:  Goal status: INITIAL  2.  Increase AROM to [facility] by discharge. Baseline: 4>90 Goal status: INITIAL  3.  Increase strength to at least 4+/5 by discharge. Baseline: 4-/5 Goal status: INITIAL  4.  Max pain to 2/10 or less with all activities. Baseline: 7/10 Goal status: INITIAL  5.  Normalize gait without AD. Baseline:  Goal status: INITIAL  6.  Pt able to ambulate community distances without pain or AD. Baseline:  Goal status: INITIAL   PLAN:  PT FREQUENCY: 2x/week  PT DURATION: 8 weeks  PLANNED INTERVENTIONS: 97164- PT Re-evaluation, 97110-Therapeutic exercises, 97530- Therapeutic activity, [id number]- Neuromuscular re-education, 97535- Self Care, 02859- Manual therapy, [phone]- Gait training, [phone]- Aquatic Therapy, [phone]- Electrical stimulation (unattended), 97016- Vasopneumatic device, Patient/Family education, Balance training, Stair training, Joint mobilization, Cryotherapy, and Moist heat  PLAN FOR NEXT SESSION: Continue with ROM and strengthening exercise.   Mliss Cummins, PT 06/24/24 10:17 AM

## 2024-06-21 NOTE — Telephone Encounter (Signed)
 She can drive as long as she is not on any pain meds and can move her right leg from gas to brake and slam on the brake

## 2024-06-21 NOTE — Telephone Encounter (Signed)
 Called and notified patient.

## 2024-06-21 NOTE — Telephone Encounter (Signed)
 Patient called and said when can she start driving again? CB#[phone]

## 2024-06-21 NOTE — Telephone Encounter (Signed)
  Right knee scope 06/06/2024

## 2024-06-24 ENCOUNTER — Encounter: Payer: Self-pay | Admitting: Physical Therapy

## 2024-06-24 ENCOUNTER — Ambulatory Visit: Attending: Physician Assistant | Admitting: Physical Therapy

## 2024-06-24 DIAGNOSIS — M25661 Stiffness of right knee, not elsewhere classified: Secondary | ICD-10-CM | POA: Insufficient documentation

## 2024-06-24 DIAGNOSIS — R600 Localized edema: Secondary | ICD-10-CM | POA: Diagnosis not present

## 2024-06-24 DIAGNOSIS — M25561 Pain in right knee: Secondary | ICD-10-CM | POA: Insufficient documentation

## 2024-06-24 DIAGNOSIS — G8929 Other chronic pain: Secondary | ICD-10-CM | POA: Insufficient documentation

## 2024-06-24 DIAGNOSIS — R269 Unspecified abnormalities of gait and mobility: Secondary | ICD-10-CM | POA: Insufficient documentation

## 2024-06-28 ENCOUNTER — Ambulatory Visit: Attending: Physician Assistant | Admitting: Physical Therapy

## 2024-06-28 ENCOUNTER — Encounter: Payer: Self-pay | Admitting: Physical Therapy

## 2024-06-28 DIAGNOSIS — R269 Unspecified abnormalities of gait and mobility: Secondary | ICD-10-CM | POA: Insufficient documentation

## 2024-06-28 DIAGNOSIS — G8929 Other chronic pain: Secondary | ICD-10-CM | POA: Insufficient documentation

## 2024-06-28 DIAGNOSIS — M25661 Stiffness of right knee, not elsewhere classified: Secondary | ICD-10-CM | POA: Insufficient documentation

## 2024-06-28 DIAGNOSIS — M25561 Pain in right knee: Secondary | ICD-10-CM | POA: Diagnosis not present

## 2024-06-28 DIAGNOSIS — R600 Localized edema: Secondary | ICD-10-CM | POA: Diagnosis not present

## 2024-06-28 NOTE — Therapy (Signed)
 OUTPATIENT PHYSICAL THERAPY LOWER EXTREMITY TREATMENT   Patient Name: Nancy Christensen MRN: [id number] DOB:05/01/1951, 73 y.o., female Today's Date: 06/28/2024  END OF SESSION:  PT End of Session - 06/28/24 1102     Visit Number 4    Number of Visits 17    Date for Recertification  08/13/24    Authorization Type MCR    Progress Note Due on Visit 10    PT Start Time 1100    PT Stop Time 1145    PT Time Calculation (min) 45 min    Activity Tolerance Patient tolerated treatment well            Past Medical History:  Diagnosis Date   Arthritis 2019   left hip and knee   Atypical chest pain 10/25/2018   Cholecystitis with cholelithiasis 02/11/2018   Chronic right shoulder pain 07/04/2018   Constipation due to outlet dysfunction    Dizziness 10/25/2018   Dyslipidemia 10/25/2018   Essential hypertension 04/28/2018   Changed arb to CCB 04/24/2018 due to cough x one month trial    Hyperlipidemia    Hypertension    Hypertensive disorder 04/18/2018   Multiple pulmonary nodules determined by computed tomography of lung 01/26/2018   Passive smoke exp/ remote  Chest CT  09/24/17 MPN's  Largest 4 x 6 mm R laterally  > rec f/u 09/14/18 (reminder file)   As pt reports neg CT 2015 (not avail)  - ESR 01/25/2018 = 35  - CT chest 07/11/18 =  5 mm > no f/u needed    Primary osteoarthritis of left hip 11/26/2019   Primary osteoarthritis of left knee 03/11/2020   Rectal pain    Sinus bradycardia 10/25/2018   Unilateral primary osteoarthritis, left knee 06/14/2018   Upper airway cough syndrome 01/25/2018   Onset 1980 CT head 12/22/17 neg sinus dz FENO 01/25/2018  =   21 - Allergy  profile 01/25/2018 >  Eos 0.1 /  IgE  17 RAST neg  - flare 02/12/18 p et  - gabapentin  100 mg tid 03/23/2018 > increased to max of 300 tid 04/24/2018 > improved so increased to 300 qid 06/05/2018 > d/c'd around 02/2019 s flare    Past Surgical History:  Procedure Laterality Date   ABDOMINAL HYSTERECTOMY  1997   ANAL RECTAL  MANOMETRY N/A 08/09/2019   Procedure: ANO RECTAL MANOMETRY;  Surgeon: Eda Iha, MD;  Location: [facility];  Service: Gastroenterology;  Laterality: N/A;   BREAST SURGERY  2002   left cyst removal    CHOLECYSTECTOMY N/A 02/12/2018   Procedure: LAPAROSCOPIC CHOLECYSTECTOMY;  Surgeon: Debby Hila, MD;  Location: [facility];  Service: General;  Laterality: N/A;   COLONOSCOPY  07/12/2019   POLYPECTOMY     Patient Active Problem List   Diagnosis Date Noted   Epistaxis 12/06/2023   Vitamin D  deficiency 06/27/2023   Puncture wound of toe of left foot 05/06/2022   Hypertension    Hyperlipidemia    Arthritis    Primary osteoarthritis of left knee 03/11/2020   Primary osteoarthritis of left hip 11/26/2019   Constipation due to outlet dysfunction    Rectal pain    Dizziness 10/25/2018   Sinus bradycardia 10/25/2018   Atypical chest pain 10/25/2018   Dyslipidemia 10/25/2018   Chronic right shoulder pain 07/04/2018   Essential hypertension 04/28/2018   Hypertensive disorder 04/18/2018   Cholecystitis with cholelithiasis 02/11/2018   Multiple pulmonary nodules determined by computed tomography of lung 01/26/2018   Upper airway cough syndrome 01/25/2018  PCP: Mercer Clotilda SAUNDERS, MD   REFERRING PROVIDER: Jule Shuck PA   REFERRING DIAG: [id number] (ICD-10-CM) - S/P right knee arthroscopy 06/06/24  THERAPY DIAG:  Chronic pain of right knee  Stiffness of right knee, not elsewhere classified  Localized edema  Altered gait  Rationale for Evaluation and Treatment: Rehabilitation  ONSET DATE: 04/25/24; S/P right knee arthroscopy 06/06/24  SUBJECTIVE:   SUBJECTIVE STATEMENT: Yesterday had (medial) knee pain b/c I tried to walk more.  Today it feels better. Not using cane but uses on steps at home.  One at a time pattern on stairs.    Eval-Pt states occasional R knee weakness.  04/25/24 she was stepping onto a train step and the knee gave out.  She was in [place] .  She went  to UC and then orthopedist.  She had xray and MRI which was + for torn meniscus.  She followed up with Dr Jerri and had surgery on 06/06/24.  PERTINENT HISTORY: R knee gel injections in the past but no surgery Left knee issues bone on bone  PAIN:  Are you having pain? Yes: NPRS scale: 4/10 Pain location: lateral and posterior Pain description: deep ache Aggravating factors: standing, lifting R LE Relieving factors: ice, meds  PRECAUTIONS: None  RED FLAGS: None   WEIGHT BEARING RESTRICTIONS: No  FALLS:  Has patient fallen in last 6 months? No  LIVING ENVIRONMENT: Lives with: lives alone Lives in: House/apartment Stairs: Yes: External: 12 steps; bilateral but cannot reach both Has following equipment at home: Single point cane  OCCUPATION: PT requires standing   PLOF: Independent  PATIENT GOALS: decrease pain, full activity  NEXT MD VISIT: 5 wks  OBJECTIVE:  Note: Objective measures were completed at Evaluation unless otherwise noted.  DIAGNOSTIC FINDINGS: 03/25/24  IMPRESSION: Slight degenerative changes.  PATIENT SURVEYS:  PSFS: THE PATIENT SPECIFIC FUNCTIONAL SCALE  Place score of 0-10 (0 = unable to perform activity and 10 = able to perform activity at the same level as before injury or problem)  Activity Date: 06/17/24    [facility] knee like for sitting 5    2.full weight bearing/walking 8    3.lifting leg to ascend stairs 5    4.      Total Score 6      Total Score = Sum of activity scores/number of activities  Minimally Detectable Change: 3 points (for single activity); 2 points (for average score)  [facility] (nd). The Patient Specific Functional Scale . Retrieved from [email or web address]   COGNITION: Overall cognitive status: Within functional limits for tasks assessed     SENSATION: WFL  EDEMA:  Circumferential: --50.5cm  MUSCLE LENGTH: Hamstrings: Right mod restriction  deg  POSTURE: flexed trunk   PALPATION: 2+ tenderness joint line  LOWER EXTREMITY ROM:  Active/Passive ROM Right eval Left eval Right 06/24/24  Hip flexion     Hip extension     Hip abduction     Hip adduction     Hip internal rotation     Hip external rotation     Knee flexion 90/102  113  Knee extension +4/0  0  Ankle dorsiflexion     Ankle plantarflexion     Ankle inversion     Ankle eversion      (Blank rows = not tested)  LOWER EXTREMITY MMT:  MMT Right eval Left eval  Hip flexion 4   Hip extension    Hip abduction    Hip adduction    Hip internal rotation  Hip external rotation    Knee flexion 4+   Knee extension 4-   Ankle dorsiflexion 4   Ankle plantarflexion    Ankle inversion    Ankle eversion     (Blank rows = not tested)  LOWER EXTREMITY SPECIAL TESTS:  No special tests completed due to post op status  FUNCTIONAL TESTS:  5 times sit to stand: 23 sec  GAIT: Distance walked: 100 Assistive device utilized: Single point cane Level of assistance: Complete Independence Comments: Pt needed VC for step through pattern and cane use in proper L hand.  TREATMENT DATE: 06/27/24 Nustep 7 min level 5 (while discussing status and response to treatment) Seated floor sliders 20x LAQ 10x2 Seated blue band clams 20x Seated quad isometric push green ball 5 sec hold 12x Standing heel raises bil 15x (Added to HEP- see below)  Standing TKE blue band 5 sec hold 10x (Added to HEP- see below) Standing at the base of the stairs: WB on right with step taps Sit to stand from very high table 10x 2 (Added to HEP- see below)                                                                                                                               TREATMENT DATE: R knee 06/24/24 Nustep 7.5 min level 5  Incline board stretch 3x30 sec LAQ 3x10 Quad sets 3x10 SLR 3x10 cues for eccentric control at end range Hip ABD 2x 10 B Hip ADD 2x10 B S/L clam x 20  B Checked ROM Ice x 10 min to post knee at end of session   06/20/24 HEP review Nustep 6 min level 3 Incline board stretch 3x30 sec LAQ 3x10 Quad sets 2x10 SLR/SR 3x10 Gait training with SPC and then stair training using rail on the R side for ascending and descending external stairs at apartment.    PATIENT EDUCATION:  Education details: HEP Person educated: Patient Education method: Explanation, Demonstration, Actor cues, and Verbal cues Education comprehension: verbalized understanding, returned demonstration, and verbal cues required  HOME EXERCISE PROGRAM: Access Code: [email or web address] URL: [email or web address] Date: 06/28/2024 Prepared by: Glade Pesa  Exercises - Supine Heel Slide  - 2 x daily - 7 x weekly - 2 sets - 10 reps - Supine Quad Set  - 2 x daily - 7 x weekly - 2 sets - 10 reps - 2 hold - Supine Ankle Pumps  - 2 x daily - 7 x weekly - 3 sets - 10 reps - Hooklying Hamstring Stretch with Strap  - 2 x daily - 7 x weekly - 2 sets - 10 reps - 25 hold - Seated Long Arc Quad  - 2 x daily - 7 x weekly - 2 sets - 10 reps - Supine Active Straight Leg Raise  - 2 x daily - 7 x weekly - 2 sets - 10 reps - Sidelying Hip Abduction  - 1 x daily - 3 x  weekly - 2 sets - 10 reps - Sidelying Hip Adduction  - 1 x daily - 3 x weekly - 2 sets - 10 reps - Standing Terminal Knee Extension with Resistance  - 1 x daily - 7 x weekly - 2 sets - 10 reps - Standing Heel Raises  - 1 x daily - 7 x weekly - 2 sets - 10 reps - Sit to Stand  - 1 x daily - 7 x weekly - 1 sets - 10 reps  ASSESSMENT:  CLINICAL IMPRESSION: Therapist providing verbal cues to optimize technique with exercises in order to achieve the greatest benefit. Therapist also progressing and updating HEP for increased intensity and challenge level for further strengthening and functional mobility.   She reports less knee pain at the end of session than on arrival.    OBJECTIVE IMPAIRMENTS: Abnormal gait, decreased  endurance, decreased mobility, difficulty walking, decreased ROM, decreased strength, increased edema, impaired flexibility, and pain.   ACTIVITY LIMITATIONS: sitting, standing, squatting, stairs, and transfers  PARTICIPATION LIMITATIONS: meal prep, driving, community activity, and occupation  PERSONAL FACTORS: Age are also affecting patient's functional outcome.   REHAB POTENTIAL: Good  CLINICAL DECISION MAKING: Stable/uncomplicated  EVALUATION COMPLEXITY: Moderate   GOALS: Goals reviewed with patient? Yes  SHORT TERM GOALS: Target date: 07/16/2024   Pt to be independent with HEP. Baseline: Goal status: INITIAL  2.  Decrease pain by 1 level. Baseline:  Goal status: INITIAL  3.  Pt able to phase out cane. Baseline:  Goal status: INITIAL  LONG TERM GOALS: Target date: 08/13/2024      Pt to be independent with self progressive HEP by time of discharge. Baseline:  Goal status: INITIAL  2.  Increase AROM to [facility] by discharge. Baseline: 4>90 Goal status: INITIAL  3.  Increase strength to at least 4+/5 by discharge. Baseline: 4-/5 Goal status: INITIAL  4.  Max pain to 2/10 or less with all activities. Baseline: 7/10 Goal status: INITIAL  5.  Normalize gait without AD. Baseline:  Goal status: INITIAL  6.  Pt able to ambulate community distances without pain or AD. Baseline:  Goal status: INITIAL   PLAN:  PT FREQUENCY: 2x/week  PT DURATION: 8 weeks  PLANNED INTERVENTIONS: 97164- PT Re-evaluation, 97110-Therapeutic exercises, 97530- Therapeutic activity, 97112- Neuromuscular re-education, 97535- Self Care, 02859- Manual therapy, [phone]- Gait training, [phone]- Aquatic Therapy, [phone]- Electrical stimulation (unattended), 97016- Vasopneumatic device, Patient/Family education, Balance training, Stair training, Joint mobilization, Cryotherapy, and Moist heat  PLAN FOR NEXT SESSION: Continue with progressive ROM and strengthening exercise  Glade Pesa,  PT 06/28/24 11:57 AM Phone: [phone] Fax: [phone]

## 2024-06-30 NOTE — Therapy (Incomplete)
 OUTPATIENT PHYSICAL THERAPY LOWER EXTREMITY TREATMENT   Patient Name: Nancy Christensen MRN: [id number] DOB:May 17, 1951, 73 y.o., female Today's Date: 06/30/2024  END OF SESSION:      Past Medical History:  Diagnosis Date   Arthritis 2019   left hip and knee   Atypical chest pain 10/25/2018   Cholecystitis with cholelithiasis 02/11/2018   Chronic right shoulder pain 07/04/2018   Constipation due to outlet dysfunction    Dizziness 10/25/2018   Dyslipidemia 10/25/2018   Essential hypertension 04/28/2018   Changed arb to CCB 04/24/2018 due to cough x one month trial    Hyperlipidemia    Hypertension    Hypertensive disorder 04/18/2018   Multiple pulmonary nodules determined by computed tomography of lung 01/26/2018   Passive smoke exp/ remote  Chest CT  09/24/17 MPN's  Largest 4 x 6 mm R laterally  > rec f/u 09/14/18 (reminder file)   As pt reports neg CT 2015 (not avail)  - ESR 01/25/2018 = 35  - CT chest 07/11/18 =  5 mm > no f/u needed    Primary osteoarthritis of left hip 11/26/2019   Primary osteoarthritis of left knee 03/11/2020   Rectal pain    Sinus bradycardia 10/25/2018   Unilateral primary osteoarthritis, left knee 06/14/2018   Upper airway cough syndrome 01/25/2018   Onset 1980 CT head 12/22/17 neg sinus dz FENO 01/25/2018  =   21 - Allergy  profile 01/25/2018 >  Eos 0.1 /  IgE  17 RAST neg  - flare 02/12/18 p et  - gabapentin  100 mg tid 03/23/2018 > increased to max of 300 tid 04/24/2018 > improved so increased to 300 qid 06/05/2018 > d/c'd around 02/2019 s flare    Past Surgical History:  Procedure Laterality Date   ABDOMINAL HYSTERECTOMY  1997   ANAL RECTAL MANOMETRY N/A 08/09/2019   Procedure: ANO RECTAL MANOMETRY;  Surgeon: Eda Iha, MD;  Location: [facility];  Service: Gastroenterology;  Laterality: N/A;   BREAST SURGERY  2002   left cyst removal    CHOLECYSTECTOMY N/A 02/12/2018   Procedure: LAPAROSCOPIC CHOLECYSTECTOMY;  Surgeon: Debby Hila, MD;  Location:  [facility];  Service: General;  Laterality: N/A;   COLONOSCOPY  07/12/2019   POLYPECTOMY     Patient Active Problem List   Diagnosis Date Noted   Epistaxis 12/06/2023   Vitamin D  deficiency 06/27/2023   Puncture wound of toe of left foot 05/06/2022   Hypertension    Hyperlipidemia    Arthritis    Primary osteoarthritis of left knee 03/11/2020   Primary osteoarthritis of left hip 11/26/2019   Constipation due to outlet dysfunction    Rectal pain    Dizziness 10/25/2018   Sinus bradycardia 10/25/2018   Atypical chest pain 10/25/2018   Dyslipidemia 10/25/2018   Chronic right shoulder pain 07/04/2018   Essential hypertension 04/28/2018   Hypertensive disorder 04/18/2018   Cholecystitis with cholelithiasis 02/11/2018   Multiple pulmonary nodules determined by computed tomography of lung 01/26/2018   Upper airway cough syndrome 01/25/2018    PCP: Mercer Clotilda SAUNDERS, MD   REFERRING PROVIDER: Jule Shuck PA   REFERRING DIAG: [phone] (ICD-10-CM) - S/P right knee arthroscopy 06/06/24  THERAPY DIAG:  No diagnosis found.  Rationale for Evaluation and Treatment: Rehabilitation  ONSET DATE: 04/25/24; S/P right knee arthroscopy 06/06/24  SUBJECTIVE:   SUBJECTIVE STATEMENT: ***   Eval-Pt states occasional R knee weakness.  04/25/24 she was stepping onto a train step and the knee gave out.  She was in  [place] .  She went to UC and then orthopedist.  She had xray and MRI which was + for torn meniscus.  She followed up with Dr Jerri and had surgery on 06/06/24.  PERTINENT HISTORY: R knee gel injections in the past but no surgery Left knee issues bone on bone  PAIN:  Are you having pain? Yes: NPRS scale: 4/10 Pain location: lateral and posterior Pain description: deep ache Aggravating factors: standing, lifting R LE Relieving factors: ice, meds  PRECAUTIONS: None  RED FLAGS: None   WEIGHT BEARING RESTRICTIONS: No  FALLS:  Has patient fallen in last 6 months? No  LIVING  ENVIRONMENT: Lives with: lives alone Lives in: House/apartment Stairs: Yes: External: 12 steps; bilateral but cannot reach both Has following equipment at home: Single point cane  OCCUPATION: PT requires standing   PLOF: Independent  PATIENT GOALS: decrease pain, full activity  NEXT MD VISIT: 5 wks  OBJECTIVE:  Note: Objective measures were completed at Evaluation unless otherwise noted.  DIAGNOSTIC FINDINGS: 03/25/24  IMPRESSION: Slight degenerative changes.  PATIENT SURVEYS:  PSFS: THE PATIENT SPECIFIC FUNCTIONAL SCALE  Place score of 0-10 (0 = unable to perform activity and 10 = able to perform activity at the same level as before injury or problem)  Activity Date: 06/17/24    [facility] knee like for sitting 5    2.full weight bearing/walking 8    3.lifting leg to ascend stairs 5    4.      Total Score 6      Total Score = Sum of activity scores/number of activities  Minimally Detectable Change: 3 points (for single activity); 2 points (for average score)  [facility] (nd). The Patient Specific Functional Scale . Retrieved from [email or web address]   COGNITION: Overall cognitive status: Within functional limits for tasks assessed     SENSATION: WFL  EDEMA:  Circumferential: --50.5cm  MUSCLE LENGTH: Hamstrings: Right mod restriction deg  POSTURE: flexed trunk   PALPATION: 2+ tenderness joint line  LOWER EXTREMITY ROM:  Active/Passive ROM Right eval Left eval Right 06/24/24  Hip flexion     Hip extension     Hip abduction     Hip adduction     Hip internal rotation     Hip external rotation     Knee flexion 90/102  113  Knee extension +4/0  0  Ankle dorsiflexion     Ankle plantarflexion     Ankle inversion     Ankle eversion      (Blank rows = not tested)  LOWER EXTREMITY MMT:  MMT Right eval Left eval  Hip flexion 4   Hip extension    Hip abduction    Hip adduction    Hip  internal rotation    Hip external rotation    Knee flexion 4+   Knee extension 4-   Ankle dorsiflexion 4   Ankle plantarflexion    Ankle inversion    Ankle eversion     (Blank rows = not tested)  LOWER EXTREMITY SPECIAL TESTS:  No special tests completed due to post op status  FUNCTIONAL TESTS:  5 times sit to stand: 23 sec  GAIT: Distance walked: 100 Assistive device utilized: Single point cane Level of assistance: Complete Independence Comments: Pt needed VC for step through pattern and cane use in proper L hand.  TREATMENT DATE: 07/01/24 Nustep 7 min level 5 (while discussing status and response to treatment) Seated floor sliders 20x LAQ 10x2 Seated blue band clams 20x  Seated quad isometric push green ball 5 sec hold 12x Standing heel raises bil 15x (Added to HEP- see below)  Standing TKE blue band 5 sec hold 10x (Added to HEP- see below) Standing at the base of the stairs: WB on right with step taps Sit to stand from very high table 10x 2 (Added to HEP- see below) balance   06/27/24 Nustep 7 min level 5 (while discussing status and response to treatment) Seated floor sliders 20x LAQ 10x2 Seated blue band clams 20x Seated quad isometric push green ball 5 sec hold 12x Standing heel raises bil 15x (Added to HEP- see below)  Standing TKE blue band 5 sec hold 10x (Added to HEP- see below) Standing at the base of the stairs: WB on right with step taps Sit to stand from very high table 10x 2 (Added to HEP- see below)                                                                                                                               TREATMENT DATE: R knee 06/24/24 Nustep 7.5 min level 5  Incline board stretch 3x30 sec LAQ 3x10 Quad sets 3x10 SLR 3x10 cues for eccentric control at end range Hip ABD 2x 10 B Hip ADD 2x10 B S/L clam x 20 B Checked ROM Ice x 10 min to post knee at end of session   06/20/24 HEP review Nustep 6 min level 3 Incline board  stretch 3x30 sec LAQ 3x10 Quad sets 2x10 SLR/SR 3x10 Gait training with SPC and then stair training using rail on the R side for ascending and descending external stairs at apartment.    PATIENT EDUCATION:  Education details: HEP Person educated: Patient Education method: Explanation, Demonstration, Actor cues, and Verbal cues Education comprehension: verbalized understanding, returned demonstration, and verbal cues required  HOME EXERCISE PROGRAM: Access Code: [email or web address] URL: [email or web address] Date: 06/28/2024 Prepared by: Glade Pesa  Exercises - Supine Heel Slide  - 2 x daily - 7 x weekly - 2 sets - 10 reps - Supine Quad Set  - 2 x daily - 7 x weekly - 2 sets - 10 reps - 2 hold - Supine Ankle Pumps  - 2 x daily - 7 x weekly - 3 sets - 10 reps - Hooklying Hamstring Stretch with Strap  - 2 x daily - 7 x weekly - 2 sets - 10 reps - 25 hold - Seated Long Arc Quad  - 2 x daily - 7 x weekly - 2 sets - 10 reps - Supine Active Straight Leg Raise  - 2 x daily - 7 x weekly - 2 sets - 10 reps - Sidelying Hip Abduction  - 1 x daily - 3 x weekly - 2 sets - 10 reps - Sidelying Hip Adduction  - 1 x daily - 3 x weekly - 2 sets - 10 reps - Standing Terminal Knee Extension with Resistance  - 1 x daily -  7 x weekly - 2 sets - 10 reps - Standing Heel Raises  - 1 x daily - 7 x weekly - 2 sets - 10 reps - Sit to Stand  - 1 x daily - 7 x weekly - 1 sets - 10 reps  ASSESSMENT:  CLINICAL IMPRESSION: ***   OBJECTIVE IMPAIRMENTS: Abnormal gait, decreased endurance, decreased mobility, difficulty walking, decreased ROM, decreased strength, increased edema, impaired flexibility, and pain.   ACTIVITY LIMITATIONS: sitting, standing, squatting, stairs, and transfers  PARTICIPATION LIMITATIONS: meal prep, driving, community activity, and occupation  PERSONAL FACTORS: Age are also affecting patient's functional outcome.   REHAB POTENTIAL: Good  CLINICAL DECISION MAKING:  Stable/uncomplicated  EVALUATION COMPLEXITY: Moderate   GOALS: Goals reviewed with patient? Yes  SHORT TERM GOALS: Target date: 07/16/2024   Pt to be independent with HEP. Baseline: Goal status: INITIAL  2.  Decrease pain by 1 level. Baseline:  Goal status: INITIAL  3.  Pt able to phase out cane. Baseline:  Goal status: INITIAL  LONG TERM GOALS: Target date: 08/13/2024      Pt to be independent with self progressive HEP by time of discharge. Baseline:  Goal status: INITIAL  2.  Increase AROM to [facility] by discharge. Baseline: 4>90 Goal status: INITIAL  3.  Increase strength to at least 4+/5 by discharge. Baseline: 4-/5 Goal status: INITIAL  4.  Max pain to 2/10 or less with all activities. Baseline: 7/10 Goal status: INITIAL  5.  Normalize gait without AD. Baseline:  Goal status: INITIAL  6.  Pt able to ambulate community distances without pain or AD. Baseline:  Goal status: INITIAL   PLAN:  PT FREQUENCY: 2x/week  PT DURATION: 8 weeks  PLANNED INTERVENTIONS: 97164- PT Re-evaluation, 97110-Therapeutic exercises, 97530- Therapeutic activity, 97112- Neuromuscular re-education, 97535- Self Care, 02859- Manual therapy, [phone]- Gait training, [phone]- Aquatic Therapy, [phone]- Electrical stimulation (unattended), 97016- Vasopneumatic device, Patient/Family education, Balance training, Stair training, Joint mobilization, Cryotherapy, and Moist heat  PLAN FOR NEXT SESSION: Continue with progressive ROM and strengthening exercise  Mliss Cummins, PT  06/30/24 8:26 PM Phone: [phone] Fax: [phone]

## 2024-07-01 ENCOUNTER — Encounter: Payer: Self-pay | Admitting: Physical Therapy

## 2024-07-01 ENCOUNTER — Ambulatory Visit: Admitting: Physical Therapy

## 2024-07-01 DIAGNOSIS — M25561 Pain in right knee: Secondary | ICD-10-CM | POA: Diagnosis not present

## 2024-07-01 DIAGNOSIS — R269 Unspecified abnormalities of gait and mobility: Secondary | ICD-10-CM | POA: Diagnosis not present

## 2024-07-01 DIAGNOSIS — G8929 Other chronic pain: Secondary | ICD-10-CM

## 2024-07-01 DIAGNOSIS — M25661 Stiffness of right knee, not elsewhere classified: Secondary | ICD-10-CM | POA: Diagnosis not present

## 2024-07-01 DIAGNOSIS — R600 Localized edema: Secondary | ICD-10-CM

## 2024-07-01 NOTE — Therapy (Signed)
 OUTPATIENT PHYSICAL THERAPY LOWER EXTREMITY TREATMENT   Patient Name: Nancy Christensen MRN: [id number] DOB:07-Sep-1951, 73 y.o., female Today's Date: 07/01/2024  END OF SESSION:  PT End of Session - 07/01/24 1403     Visit Number 5    Number of Visits 17    Date for Recertification  08/13/24    Authorization Type MCR    PT Start Time 1401    PT Stop Time 1445    PT Time Calculation (min) 44 min    Activity Tolerance Patient tolerated treatment well    Behavior During Therapy [facility] for tasks assessed/performed             Past Medical History:  Diagnosis Date   Arthritis 2019   left hip and knee   Atypical chest pain 10/25/2018   Cholecystitis with cholelithiasis 02/11/2018   Chronic right shoulder pain 07/04/2018   Constipation due to outlet dysfunction    Dizziness 10/25/2018   Dyslipidemia 10/25/2018   Essential hypertension 04/28/2018   Changed arb to CCB 04/24/2018 due to cough x one month trial    Hyperlipidemia    Hypertension    Hypertensive disorder 04/18/2018   Multiple pulmonary nodules determined by computed tomography of lung 01/26/2018   Passive smoke exp/ remote  Chest CT  09/24/17 MPN's  Largest 4 x 6 mm R laterally  > rec f/u 09/14/18 (reminder file)   As pt reports neg CT 2015 (not avail)  - ESR 01/25/2018 = 35  - CT chest 07/11/18 =  5 mm > no f/u needed    Primary osteoarthritis of left hip 11/26/2019   Primary osteoarthritis of left knee 03/11/2020   Rectal pain    Sinus bradycardia 10/25/2018   Unilateral primary osteoarthritis, left knee 06/14/2018   Upper airway cough syndrome 01/25/2018   Onset 1980 CT head 12/22/17 neg sinus dz FENO 01/25/2018  =   21 - Allergy  profile 01/25/2018 >  Eos 0.1 /  IgE  17 RAST neg  - flare 02/12/18 p et  - gabapentin  100 mg tid 03/23/2018 > increased to max of 300 tid 04/24/2018 > improved so increased to 300 qid 06/05/2018 > d/c'd around 02/2019 s flare    Past Surgical History:  Procedure Laterality Date   ABDOMINAL  HYSTERECTOMY  1997   ANAL RECTAL MANOMETRY N/A 08/09/2019   Procedure: ANO RECTAL MANOMETRY;  Surgeon: Eda Iha, MD;  Location: [facility];  Service: Gastroenterology;  Laterality: N/A;   BREAST SURGERY  2002   left cyst removal    CHOLECYSTECTOMY N/A 02/12/2018   Procedure: LAPAROSCOPIC CHOLECYSTECTOMY;  Surgeon: Debby Hila, MD;  Location: [facility];  Service: General;  Laterality: N/A;   COLONOSCOPY  07/12/2019   POLYPECTOMY     Patient Active Problem List   Diagnosis Date Noted   Epistaxis 12/06/2023   Vitamin D  deficiency 06/27/2023   Puncture wound of toe of left foot 05/06/2022   Hypertension    Hyperlipidemia    Arthritis    Primary osteoarthritis of left knee 03/11/2020   Primary osteoarthritis of left hip 11/26/2019   Constipation due to outlet dysfunction    Rectal pain    Dizziness 10/25/2018   Sinus bradycardia 10/25/2018   Atypical chest pain 10/25/2018   Dyslipidemia 10/25/2018   Chronic right shoulder pain 07/04/2018   Essential hypertension 04/28/2018   Hypertensive disorder 04/18/2018   Cholecystitis with cholelithiasis 02/11/2018   Multiple pulmonary nodules determined by computed tomography of lung 01/26/2018   Upper airway cough  syndrome 01/25/2018    PCP: Mercer Clotilda SAUNDERS, MD   REFERRING PROVIDER: Jule Shuck PA   REFERRING DIAG: [phone] (ICD-10-CM) - S/P right knee arthroscopy 06/06/24  THERAPY DIAG:  Chronic pain of right knee  Stiffness of right knee, not elsewhere classified  Localized edema  Altered gait  Rationale for Evaluation and Treatment: Rehabilitation  ONSET DATE: 04/25/24; S/P right knee arthroscopy 06/06/24  SUBJECTIVE:   SUBJECTIVE STATEMENT: It's getting better.    Eval-Pt states occasional R knee weakness.  04/25/24 she was stepping onto a train step and the knee gave out.  She was in [place] .  She went to [facility] and then [facility]opedist.  She had xray and MRI which was + for torn meniscus.  She followed up with  Dr Jerri and had surgery on 06/06/24.  PERTINENT HISTORY: R knee gel injections in the past but no surgery Left knee issues bone on bone  PAIN:  Are you having pain? Yes: NPRS scale: 0/10 Pain location: lateral and posterior Pain description: deep ache Aggravating factors: standing, lifting R LE Relieving factors: ice, meds  PRECAUTIONS: None  RED FLAGS: None   WEIGHT BEARING RESTRICTIONS: No  FALLS:  Has patient fallen in last 6 months? No  LIVING ENVIRONMENT: Lives with: lives alone Lives in: House/apartment Stairs: Yes: External: 12 steps; bilateral but cannot reach both Has following equipment at home: Single point cane  OCCUPATION: PT requires standing   PLOF: Independent  PATIENT GOALS: decrease pain, full activity  NEXT MD VISIT: 5 wks  OBJECTIVE:  Note: Objective measures were completed at Evaluation unless otherwise noted.  DIAGNOSTIC FINDINGS: 03/25/24  IMPRESSION: Slight degenerative changes.  PATIENT SURVEYS:  PSFS: THE PATIENT SPECIFIC FUNCTIONAL SCALE  Place score of 0-10 (0 = unable to perform activity and 10 = able to perform activity at the same level as before injury or problem)  Activity Date: 06/17/24    [facility] knee like for sitting 5    2.full weight bearing/walking 8    3.lifting leg to ascend stairs 5    4.      Total Score 6      Total Score = Sum of activity scores/number of activities  Minimally Detectable Change: 3 points (for single activity); 2 points (for average score)  [facility] (nd). The Patient Specific Functional Scale . Retrieved from [email or web address]   COGNITION: Overall cognitive status: Within functional limits for tasks assessed     SENSATION: WFL  EDEMA:  Circumferential: --50.5cm  MUSCLE LENGTH: Hamstrings: Right mod restriction deg  POSTURE: flexed trunk   PALPATION: 2+ tenderness joint line  LOWER EXTREMITY ROM:  Active/Passive ROM  Right eval Left eval Right 06/24/24 Right  07/01/24  Hip flexion      Hip extension      Hip abduction      Hip adduction      Hip internal rotation      Hip external rotation      Knee flexion 90/102 135  113 125  Knee extension +4/0  0   Ankle dorsiflexion      Ankle plantarflexion      Ankle inversion      Ankle eversion       (Blank rows = not tested)  LOWER EXTREMITY MMT:  MMT Right eval Left eval  Hip flexion 4   Hip extension    Hip abduction    Hip adduction    Hip internal rotation    Hip external rotation  Knee flexion 4+   Knee extension 4-   Ankle dorsiflexion 4   Ankle plantarflexion    Ankle inversion    Ankle eversion     (Blank rows = not tested)  LOWER EXTREMITY SPECIAL TESTS:  No special tests completed due to post op status  FUNCTIONAL TESTS:  5 times sit to stand: 23 sec  GAIT: Distance walked: 100 Assistive device utilized: Single point cane Level of assistance: Complete Independence Comments: Pt needed VC for step through pattern and cane use in proper L hand.  TREATMENT DATE: 07/01/24 Nustep 7 min level 5 (while discussing status and response to treatment) Seated floor sliders 20x LAQ 10x2 Seated black loop clams 20x Seated quad isometric push green ball 5 sec hold 2x10 Standing heel raises bil x20 Sit to stand from chair x 10  Standing TKE blue band 5 sec hold 10x  Standing at the base of the stairs: WB on right with step taps x 20 SLS 5 sec on R, tried on green oval, but pt fatigued from step taps SLR 2  x 12 cues for eccentric control at end range TFL stretch - supine with hip IR 2x30 sec Heel slides x 5 R  06/27/24 Nustep 7 min level 5 (while discussing status and response to treatment) Seated floor sliders 20x LAQ 10x2 Seated blue band clams 20x Seated quad isometric push green ball 5 sec hold 12x Standing heel raises bil 15x (Added to HEP- see below)  Standing TKE blue band 5 sec hold 10x (Added to HEP- see  below) Standing at the base of the stairs: WB on right with step taps Sit to stand from very high table 10x 2 (Added to HEP- see below)                                                                                                                               06/24/24 Nustep 7.5 min level 5  Incline board stretch 3x30 sec LAQ 3x10 Quad sets 3x10 SLR 3x10 cues for eccentric control at end range Hip ABD 2x 10 B Hip ADD 2x10 B S/L clam x 20 B Checked ROM Ice x 10 min to post knee at end of session   06/20/24 HEP review Nustep 6 min level 3 Incline board stretch 3x30 sec LAQ 3x10 Quad sets 2x10 SLR/SR 3x10 Gait training with SPC and then stair training using rail on the R side for ascending and descending external stairs at apartment.    PATIENT EDUCATION:  Education details: HEP Person educated: Patient Education method: Explanation, Demonstration, Actor cues, and Verbal cues Education comprehension: verbalized understanding, returned demonstration, and verbal cues required  HOME EXERCISE PROGRAM: Access Code: [email or web address] URL: [email or web address] Date: 07/01/2024 Prepared by: Mliss  Exercises - Supine Heel Slide  - 2 x daily - 7 x weekly - 2 sets - 10 reps - Supine Quad Set  - 2 x daily - 7 x weekly - 2 sets -  10 reps - 2 hold - Supine Ankle Pumps  - 2 x daily - 7 x weekly - 3 sets - 10 reps - Hooklying Hamstring Stretch with Strap  - 2 x daily - 7 x weekly - 2 sets - 10 reps - 25 hold - Seated Long Arc Quad  - 2 x daily - 7 x weekly - 2 sets - 10 reps - Supine Active Straight Leg Raise  - 2 x daily - 7 x weekly - 2 sets - 10 reps - Sidelying Hip Abduction  - 1 x daily - 3 x weekly - 2 sets - 10 reps - Sidelying Hip Adduction  - 1 x daily - 3 x weekly - 2 sets - 10 reps - Standing Terminal Knee Extension with Resistance  - 1 x daily - 7 x weekly - 2 sets - 10 reps - Standing Heel Raises  - 1 x daily - 7 x weekly - 2 sets - 10 reps - Sit to Stand  - 1 x  daily - 7 x weekly - 1 sets - 10 reps - Long Sitting Hip Internal Rotation stretch  (TFL)  - 2 x daily - 7 x weekly - 1 sets - 3 reps - 30 sec hold  ASSESSMENT:  CLINICAL IMPRESSION: Patient is progressing appropriately. She has less pain and her knee flexion has increased 12 degrees since last assessment. She is still 10 degrees less than her left knee. She was able to balance on her R leg today, but was challenged on the therapad. She reports less pain overall. She still fatigues with her SLR on the second set of 12 reps resulting in quad lag and mild pain. She continues to demonstrate potential for improvement and would benefit from continued skilled therapy to address impairments.     OBJECTIVE IMPAIRMENTS: Abnormal gait, decreased endurance, decreased mobility, difficulty walking, decreased ROM, decreased strength, increased edema, impaired flexibility, and pain.   ACTIVITY LIMITATIONS: sitting, standing, squatting, stairs, and transfers  PARTICIPATION LIMITATIONS: meal prep, driving, community activity, and occupation  PERSONAL FACTORS: Age are also affecting patient's functional outcome.   REHAB POTENTIAL: Good  CLINICAL DECISION MAKING: Stable/uncomplicated  EVALUATION COMPLEXITY: Moderate   GOALS: Goals reviewed with patient? Yes  SHORT TERM GOALS: Target date: 07/16/2024   Pt to be independent with HEP. Baseline: Goal status: INITIAL  2.  Decrease pain by 1 level. Baseline:  Goal status: INITIAL  3.  Pt able to phase out cane. Baseline:  Goal status: INITIAL  LONG TERM GOALS: Target date: 08/13/2024      Pt to be independent with self progressive HEP by time of discharge. Baseline:  Goal status: INITIAL  2.  Increase AROM to [facility] by discharge. Baseline: 4>90 Goal status: INITIAL  3.  Increase strength to at least 4+/5 by discharge. Baseline: 4-/5 Goal status: INITIAL  4.  Max pain to 2/10 or less with all activities. Baseline: 7/10 Goal status:  INITIAL  5.  Normalize gait without AD. Baseline:  Goal status: INITIAL  6.  Pt able to ambulate community distances without pain or AD. Baseline:  Goal status: INITIAL   PLAN:  PT FREQUENCY: 2x/week  PT DURATION: 8 weeks  PLANNED INTERVENTIONS: 97164- PT Re-evaluation, 97110-Therapeutic exercises, 97530- Therapeutic activity, 97112- Neuromuscular re-education, 97535- Self Care, 02859- Manual therapy, [phone]- Gait training, [phone]- Aquatic Therapy, [phone]- Electrical stimulation (unattended), 97016- Vasopneumatic device, Patient/Family education, Balance training, Stair training, Joint mobilization, Cryotherapy, and Moist heat  PLAN FOR NEXT SESSION: Continue  with progressive ROM and strengthening exercise  Mliss Cummins, PT  07/01/24 2:48 PM Phone: [phone] Fax: [phone]

## 2024-07-03 ENCOUNTER — Telehealth: Payer: Self-pay | Admitting: *Deleted

## 2024-07-03 ENCOUNTER — Other Ambulatory Visit: Payer: Self-pay | Admitting: Family Medicine

## 2024-07-03 DIAGNOSIS — Z1231 Encounter for screening mammogram for malignant neoplasm of breast: Secondary | ICD-10-CM

## 2024-07-03 NOTE — Telephone Encounter (Signed)
 Copied from CRM [id number]. Topic: Referral - Question >> Jul 03, 2024 11:49 AM Pinkey ORN wrote: Reason for CRM: Referral Question >> Jul 03, 2024 11:50 AM Pinkey ORN wrote: Patient states she's experiencing some pain and discomfort in her left breast and is wanting to know if she could be given a referral for mammogram and CT. Please follow up with patient.

## 2024-07-03 NOTE — Telephone Encounter (Signed)
 Called patient and left VM to return call to schedule an appt to be seen

## 2024-07-04 ENCOUNTER — Encounter: Payer: Self-pay | Admitting: Family Medicine

## 2024-07-04 ENCOUNTER — Ambulatory Visit: Admitting: Family Medicine

## 2024-07-04 VITALS — BP 128/73 | HR 55 | Temp 97.8°F | Ht 65.0 in | Wt 221.4 lb

## 2024-07-04 DIAGNOSIS — I10 Essential (primary) hypertension: Secondary | ICD-10-CM | POA: Diagnosis not present

## 2024-07-04 DIAGNOSIS — N644 Mastodynia: Secondary | ICD-10-CM

## 2024-07-04 DIAGNOSIS — Z87828 Personal history of other (healed) physical injury and trauma: Secondary | ICD-10-CM | POA: Diagnosis not present

## 2024-07-04 NOTE — Progress Notes (Signed)
 Established Patient Office Visit   Subjective  Patient ID: Nancy Christensen, female    DOB: Mar 26, 1951  Age: 73 y.o. MRN: [id number]  Chief Complaint  Patient presents with   Acute Visit    Left breast soreness,  Started 2 month ago, rate of pain 4 out of 10,     Pt is a 73 yo female seen for ongoing concern.  Patient endorses left breast tenderness, 4/10 stabbing pain x 2 months.  Patient initially thought symptoms were related to increased chocolate intake, but they persisted after stopping.  Patient also noticed lumps under left breast at rib cage.  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  Denies injury, nipple discharge, new nipple inversion, edema, erythema, rash.  Since last OFV patient notes tearing R knee meniscus while on the way to visit her daughter in [place].  To the injury patient was unable to leave for her trip to [place].  Patient was seen at [facility] by Ortho.  Doing exercises from physical therapy.  Patient taking irbesartan  75 mg and a 150 mg tab for total dose of 225 mg daily.  Also taking spironolactone  25 mg daily.    Patient Active Problem List   Diagnosis Date Noted   Epistaxis 12/06/2023   Vitamin D  deficiency 06/27/2023   Puncture wound of toe of left foot 05/06/2022   Hypertension    Hyperlipidemia    Arthritis    Primary osteoarthritis of left knee 03/11/2020   Primary osteoarthritis of left hip 11/26/2019   Constipation due to outlet dysfunction    Rectal pain    Dizziness 10/25/2018   Sinus bradycardia 10/25/2018   Atypical chest pain 10/25/2018   Dyslipidemia 10/25/2018   Chronic right shoulder pain 07/04/2018   Essential hypertension 04/28/2018   Hypertensive disorder 04/18/2018   Cholecystitis with cholelithiasis 02/11/2018   Multiple pulmonary nodules determined by computed tomography of lung 01/26/2018   Upper airway cough syndrome 01/25/2018   Past Medical History:  Diagnosis Date   Arthritis 2019    left hip and knee   Atypical chest pain 10/25/2018   Cholecystitis with cholelithiasis 02/11/2018   Chronic right shoulder pain 07/04/2018   Constipation due to outlet dysfunction    Dizziness 10/25/2018   Dyslipidemia 10/25/2018   Essential hypertension 04/28/2018   Changed arb to CCB 04/24/2018 due to cough x one month trial    Hyperlipidemia    Hypertension    Hypertensive disorder 04/18/2018   Multiple pulmonary nodules determined by computed tomography of lung 01/26/2018   Passive smoke exp/ remote  Chest CT  09/24/17 MPN's  Largest 4 x 6 mm R laterally  > rec f/u 09/14/18 (reminder file)   As pt reports neg CT 2015 (not avail)  - ESR 01/25/2018 = 35  - CT chest 07/11/18 =  5 mm > no f/u needed    Primary osteoarthritis of left hip 11/26/2019   Primary osteoarthritis of left knee 03/11/2020   Rectal pain    Sinus bradycardia 10/25/2018   Unilateral primary osteoarthritis, left knee 06/14/2018   Upper airway cough syndrome 01/25/2018   Onset 1980 CT head 12/22/17 neg sinus dz FENO 01/25/2018  =   21 - Allergy  profile 01/25/2018 >  Eos 0.1 /  IgE  17 RAST neg  - flare 02/12/18 p et  - gabapentin  100 mg tid 03/23/2018 > increased to max of 300 tid 04/24/2018 > improved so increased to 300 qid 06/05/2018 > d/c'd around 02/2019  s flare    Past Surgical History:  Procedure Laterality Date   ABDOMINAL HYSTERECTOMY  1997   ANAL RECTAL MANOMETRY N/A 08/09/2019   Procedure: ANO RECTAL MANOMETRY;  Surgeon: Eda Iha, MD;  Location: [facility];  Service: Gastroenterology;  Laterality: N/A;   BREAST SURGERY  2002   left cyst removal    CHOLECYSTECTOMY N/A 02/12/2018   Procedure: LAPAROSCOPIC CHOLECYSTECTOMY;  Surgeon: Debby Hila, MD;  Location: [facility];  Service: General;  Laterality: N/A;   COLONOSCOPY  07/12/2019   POLYPECTOMY     Social History   Tobacco Use   Smoking status: Never   Smokeless tobacco: Never  Vaping Use   Vaping status: Never Used  Substance Use Topics   Alcohol  use: Never   Drug use: Never   Family History  Problem Relation Age of Onset   Diabetes Mother    Hypertension Mother    Thyroid  disease Mother    Vascular Disease Mother    Varicose Veins Mother    Lung cancer Father    Colon cancer Maternal Uncle    Kidney disease Sister    Colon polyps Neg Hx    Esophageal cancer Neg Hx    Stomach cancer Neg Hx    Rectal cancer Neg Hx    Breast cancer Neg Hx    No Known Allergies  ROS Negative unless stated above    Objective:     BP 128/73 (BP Location: Right Arm, Patient Position: Sitting, Cuff Size: Large)   Pulse (!) 55   Temp 97.8 F (36.6 C) (Oral)   Ht 5' 5 (1.651 m)   Wt 221 lb 6.4 oz (100.4 kg)   SpO2 100%   BMI 36.84 kg/m  BP Readings from Last 3 Encounters:  07/04/24 128/73  04/04/24 126/80  03/25/24 (!) 180/102   Wt Readings from Last 3 Encounters:  07/04/24 221 lb 6.4 oz (100.4 kg)  04/04/24 227 lb 12.8 oz (103.3 kg)  03/25/24 230 lb (104.3 kg)      Physical Exam Constitutional:      General: She is not in acute distress.    Appearance: Normal appearance.  HENT:     Head: Normocephalic and atraumatic.     Nose: Nose normal.     Mouth/Throat:     Mouth: Mucous membranes are moist.  Cardiovascular:     Rate and Rhythm: Normal rate and regular rhythm.     Heart sounds: Normal heart sounds. No murmur heard.    No gallop.  Pulmonary:     Effort: Pulmonary effort is normal. No respiratory distress.     Breath sounds: Normal breath sounds. No wheezing, rhonchi or rales.  Chest:       Comments: Fibrodense breast tissue with cyst in b/l breast.  L breast with tenderness of cyst in medial and lateral lower breast.  TTP around well healed surgical incision of L upper chest.  No axillary lymphadenopathy. Skin:    General: Skin is warm and dry.  Neurological:     Mental Status: She is alert and oriented to person, place, and time.        07/04/2024   12:33 PM 04/04/2024   11:19 AM 04/04/2024   11:18 AM   Depression screen PHQ 2/9  Decreased Interest 0  0  Down, Depressed, Hopeless 0  0  PHQ - 2 Score 0  0  Altered sleeping 1  0  Tired, decreased energy 1  0  Change in appetite 0  1  Feeling bad or failure about yourself  0  0  Trouble concentrating 0  0  Moving slowly or fidgety/restless 0  0  Suicidal thoughts 0  0  PHQ-9 Score 2  1  Difficult doing work/chores Not difficult at all Not difficult at all       07/04/2024   12:34 PM 04/04/2024   11:19 AM 03/13/2024    3:56 PM 02/27/2024    9:47 AM  GAD 7 : Generalized Anxiety Score  Nervous, Anxious, on Edge 1 0 0 1  Control/stop worrying 0 0 0 0  Worry too much - different things 0 0 1 1  Trouble relaxing 0 0 1 0  Restless 0 0 0 0  Easily annoyed or irritable 0 0 0 0  Afraid - awful might happen 0 0 0 0  Total GAD 7 Score 1 0 2 2  Anxiety Difficulty Not difficult at all Not difficult at all Not difficult at all      No results found for any visits on 07/04/24.    Assessment & Plan:   Breast tenderness in female -     MM Digital Diagnostic Bilat; Future  Essential hypertension  History of tear of meniscus of knee joint  Increased tenderness of left breast x 2 months.  Given history of prior breast biopsy diagnostic mammogram ordered.  Given precautions.  BP initially elevated.  Recheck well-controlled.  Continue current medications including irbesartan  225 mg daily (75 mg tab and 150 mg tab) and spironolactone  25 mg daily.  Continue lifestyle modifications.  R knee lateral meniscus tear.  Status post arthroscopy.  Continue supportive care, exercises, and PT.  Continue f/u with Ortho.  Return if symptoms worsen or fail to improve.   Clotilda JONELLE Single, MD

## 2024-07-08 ENCOUNTER — Other Ambulatory Visit: Payer: Self-pay | Admitting: Family Medicine

## 2024-07-08 DIAGNOSIS — N644 Mastodynia: Secondary | ICD-10-CM

## 2024-07-09 ENCOUNTER — Other Ambulatory Visit: Payer: Self-pay | Admitting: Family Medicine

## 2024-07-09 ENCOUNTER — Ambulatory Visit
Admission: RE | Admit: 2024-07-09 | Discharge: 2024-07-09 | Disposition: A | Source: Ambulatory Visit | Attending: Family Medicine

## 2024-07-09 ENCOUNTER — Ambulatory Visit
Admission: RE | Admit: 2024-07-09 | Discharge: 2024-07-09 | Disposition: A | Discharge status: Home / Self Care | Source: Ambulatory Visit | Attending: Family Medicine | Admitting: Family Medicine

## 2024-07-09 ENCOUNTER — Ambulatory Visit: Admitting: Physical Therapy

## 2024-07-09 DIAGNOSIS — R269 Unspecified abnormalities of gait and mobility: Secondary | ICD-10-CM | POA: Diagnosis not present

## 2024-07-09 DIAGNOSIS — G8929 Other chronic pain: Secondary | ICD-10-CM

## 2024-07-09 DIAGNOSIS — M25661 Stiffness of right knee, not elsewhere classified: Secondary | ICD-10-CM

## 2024-07-09 DIAGNOSIS — M25561 Pain in right knee: Secondary | ICD-10-CM | POA: Diagnosis not present

## 2024-07-09 DIAGNOSIS — R600 Localized edema: Secondary | ICD-10-CM | POA: Diagnosis not present

## 2024-07-09 DIAGNOSIS — N644 Mastodynia: Secondary | ICD-10-CM

## 2024-07-09 DIAGNOSIS — N6489 Other specified disorders of breast: Secondary | ICD-10-CM | POA: Diagnosis not present

## 2024-07-09 DIAGNOSIS — R928 Other abnormal and inconclusive findings on diagnostic imaging of breast: Secondary | ICD-10-CM | POA: Diagnosis not present

## 2024-07-09 NOTE — Therapy (Signed)
 OUTPATIENT PHYSICAL THERAPY LOWER EXTREMITY TREATMENT   Patient Name: Nancy Christensen MRN: [id number] DOB:08/15/1951, 73 y.o., female Today's Date: 07/09/2024  END OF SESSION:  PT End of Session - 07/09/24 0927     Visit Number 6    Date for Recertification  08/13/24    Authorization Type MCR    Progress Note Due on Visit 10    PT Start Time 0928    PT Stop Time 1010    PT Time Calculation (min) 42 min    Activity Tolerance Patient tolerated treatment well             Past Medical History:  Diagnosis Date   Arthritis 2019   left hip and knee   Atypical chest pain 10/25/2018   Cholecystitis with cholelithiasis 02/11/2018   Chronic right shoulder pain 07/04/2018   Constipation due to outlet dysfunction    Dizziness 10/25/2018   Dyslipidemia 10/25/2018   Essential hypertension 04/28/2018   Changed arb to CCB 04/24/2018 due to cough x one month trial    Hyperlipidemia    Hypertension    Hypertensive disorder 04/18/2018   Multiple pulmonary nodules determined by computed tomography of lung 01/26/2018   Passive smoke exp/ remote  Chest CT  09/24/17 MPN's  Largest 4 x 6 mm R laterally  > rec f/u 09/14/18 (reminder file)   As pt reports neg CT 2015 (not avail)  - ESR 01/25/2018 = 35  - CT chest 07/11/18 =  5 mm > no f/u needed    Primary osteoarthritis of left hip 11/26/2019   Primary osteoarthritis of left knee 03/11/2020   Rectal pain    Sinus bradycardia 10/25/2018   Unilateral primary osteoarthritis, left knee 06/14/2018   Upper airway cough syndrome 01/25/2018   Onset 1980 CT head 12/22/17 neg sinus dz FENO 01/25/2018  =   21 - Allergy  profile 01/25/2018 >  Eos 0.1 /  IgE  17 RAST neg  - flare 02/12/18 p et  - gabapentin  100 mg tid 03/23/2018 > increased to max of 300 tid 04/24/2018 > improved so increased to 300 qid 06/05/2018 > d/c'd around 02/2019 s flare    Past Surgical History:  Procedure Laterality Date   ABDOMINAL HYSTERECTOMY  1997   ANAL RECTAL MANOMETRY N/A  08/09/2019   Procedure: ANO RECTAL MANOMETRY;  Surgeon: Eda Iha, MD;  Location: [facility];  Service: Gastroenterology;  Laterality: N/A;   BREAST SURGERY  2002   left cyst removal    CHOLECYSTECTOMY N/A 02/12/2018   Procedure: LAPAROSCOPIC CHOLECYSTECTOMY;  Surgeon: Debby Hila, MD;  Location: [facility];  Service: General;  Laterality: N/A;   COLONOSCOPY  07/12/2019   POLYPECTOMY     Patient Active Problem List   Diagnosis Date Noted   Epistaxis 12/06/2023   Vitamin D  deficiency 06/27/2023   Puncture wound of toe of left foot 05/06/2022   Hypertension    Hyperlipidemia    Arthritis    Primary osteoarthritis of left knee 03/11/2020   Primary osteoarthritis of left hip 11/26/2019   Constipation due to outlet dysfunction    Rectal pain    Dizziness 10/25/2018   Sinus bradycardia 10/25/2018   Atypical chest pain 10/25/2018   Dyslipidemia 10/25/2018   Chronic right shoulder pain 07/04/2018   Essential hypertension 04/28/2018   Hypertensive disorder 04/18/2018   Cholecystitis with cholelithiasis 02/11/2018   Multiple pulmonary nodules determined by computed tomography of lung 01/26/2018   Upper airway cough syndrome 01/25/2018    PCP: Mercer Kirsch  JONELLE, MD   REFERRING PROVIDER: Jule Shuck PA   REFERRING DIAG: [phone] (ICD-10-CM) - S/P right knee arthroscopy 06/06/24  THERAPY DIAG:  Chronic pain of right knee  Stiffness of right knee, not elsewhere classified  Localized edema  Rationale for Evaluation and Treatment: Rehabilitation  ONSET DATE: 04/25/24; S/P right knee arthroscopy 06/06/24  SUBJECTIVE:   SUBJECTIVE STATEMENT: Doing pretty well.  I feel good after PT but tired the next day.  I'm starting to enjoy the ex's.  Now able to cross leg over the left to put on socks/shoes.  Only uses the cane when it's raining or coming down the stairs.    Eval-Pt states occasional R knee weakness.  04/25/24 she was stepping onto a train step and the knee gave  out.  She was in [place] .  She went to [facility] and then orthopedist.  She had xray and MRI which was + for torn meniscus.  She followed up with Dr Jerri and had surgery on 06/06/24.  PERTINENT HISTORY: R knee gel injections in the past but no surgery Left knee issues bone on bone  PAIN:  Are you having pain? Yes: NPRS scale: 0/10 Pain location: lateral and posterior Pain description: deep ache Aggravating factors: standing, lifting R LE Relieving factors: ice, meds  PRECAUTIONS: None  RED FLAGS: None   WEIGHT BEARING RESTRICTIONS: No  FALLS:  Has patient fallen in last 6 months? No  LIVING ENVIRONMENT: Lives with: lives alone Lives in: House/apartment Stairs: Yes: External: 12 steps; bilateral but cannot reach both Has following equipment at home: Single point cane  OCCUPATION: PT requires standing   PLOF: Independent  PATIENT GOALS: decrease pain, full activity  NEXT MD VISIT: 5 wks  OBJECTIVE:  Note: Objective measures were completed at Evaluation unless otherwise noted.  DIAGNOSTIC FINDINGS: 03/25/24  IMPRESSION: Slight degenerative changes.  PATIENT SURVEYS:  PSFS: THE PATIENT SPECIFIC FUNCTIONAL SCALE  Place score of 0-10 (0 = unable to perform activity and 10 = able to perform activity at the same level as before injury or problem)  Activity Date: 06/17/24    [facility] knee like for sitting 5    2.full weight bearing/walking 8    3.lifting leg to ascend stairs 5    4.      Total Score 6      Total Score = Sum of activity scores/number of activities  Minimally Detectable Change: 3 points (for single activity); 2 points (for average score)  [facility] (nd). The Patient Specific Functional Scale . Retrieved from [email or web address]   COGNITION: Overall cognitive status: Within functional limits for tasks assessed     SENSATION: WFL  EDEMA:  Circumferential: --50.5cm  MUSCLE  LENGTH: Hamstrings: Right mod restriction deg  POSTURE: flexed trunk   PALPATION: 2+ tenderness joint line  LOWER EXTREMITY ROM:  Active/Passive ROM Right eval Left eval Right 06/24/24 10/14  Hip flexion      Hip extension      Hip abduction      Hip adduction      Hip internal rotation      Hip external rotation      Knee flexion 90/102  113 137  Knee extension +4/0  0 0  Ankle dorsiflexion      Ankle plantarflexion      Ankle inversion      Ankle eversion       (Blank rows = not tested)  LOWER EXTREMITY MMT:  MMT Right eval Left eval  Hip flexion  4   Hip extension    Hip abduction    Hip adduction    Hip internal rotation    Hip external rotation    Knee flexion 4+   Knee extension 4-   Ankle dorsiflexion 4   Ankle plantarflexion    Ankle inversion    Ankle eversion     (Blank rows = not tested)  LOWER EXTREMITY SPECIAL TESTS:  No special tests completed due to post op status  FUNCTIONAL TESTS:  5 times sit to stand: 23 sec  GAIT: Distance walked: 100 Assistive device utilized: Single point cane Level of assistance: Complete Independence Comments: Pt needed VC for step through pattern and cane use in proper L hand.  TREATMENT DATE: 07/09/24 Nustep 8 min level 3 (while discussing status and response to treatment) Seated floor sliders 20x LAQ 2#  x15 Prone HS curls 2# x15  SLR 2# x10 2nd step flexion 10x 2nd step HS stretch 10x Standing heel raises bil 1x   Standing TKE blue band 5 sec hold 10x  4 inch step ups 10x no UE support needed 2 inch step downs 10x no UE support needed  Floor sliders: abduction and extension 5x right/left 2 rounds Leg press seat 7 bil 60# 15x 30# single leg 10x right/left  07/01/24 Nustep 7 min level 5 (while discussing status and response to treatment) Seated floor sliders 20x LAQ 10x2 Seated blue band clams 20x Seated quad isometric push green ball 5 sec hold 12x Standing heel raises bil 15x (Added to HEP- see  below)  Standing TKE blue band 5 sec hold 10x (Added to HEP- see below) Standing at the base of the stairs: WB on right with step taps Sit to stand from very high table 10x 2 (Added to HEP- see below) balance   06/27/24 Nustep 7 min level 5 (while discussing status and response to treatment) Seated floor sliders 20x LAQ 10x2 Seated blue band clams 20x Seated quad isometric push green ball 5 sec hold 12x Standing heel raises bil 15x (Added to HEP- see below)  Standing TKE blue band 5 sec hold 10x (Added to HEP- see below) Standing at the base of the stairs: WB on right with step taps Sit to stand from very high table 10x 2 (Added to HEP- see below)                                                                                                                               TREATMENT DATE: R knee 06/24/24 Nustep 7.5 min level 5  Incline board stretch 3x30 sec LAQ 3x10 Quad sets 3x10 SLR 3x10 cues for eccentric control at end range Hip ABD 2x 10 B Hip ADD 2x10 B S/L clam x 20 B Checked ROM Ice x 10 min to post knee at end of session    PATIENT EDUCATION:  Education details: HEP Person educated: Patient Education method: Explanation, Demonstration, Tactile cues, and Verbal cues  Education comprehension: verbalized understanding, returned demonstration, and verbal cues required  HOME EXERCISE PROGRAM: Access Code: [email or web address] URL: [email or web address] Date: 06/28/2024 Prepared by: Glade Pesa  Exercises - Supine Heel Slide  - 2 x daily - 7 x weekly - 2 sets - 10 reps - Supine Quad Set  - 2 x daily - 7 x weekly - 2 sets - 10 reps - 2 hold - Supine Ankle Pumps  - 2 x daily - 7 x weekly - 3 sets - 10 reps - Hooklying Hamstring Stretch with Strap  - 2 x daily - 7 x weekly - 2 sets - 10 reps - 25 hold - Seated Long Arc Quad  - 2 x daily - 7 x weekly - 2 sets - 10 reps - Supine Active Straight Leg Raise  - 2 x daily - 7 x weekly - 2 sets - 10 reps - Sidelying Hip  Abduction  - 1 x daily - 3 x weekly - 2 sets - 10 reps - Sidelying Hip Adduction  - 1 x daily - 3 x weekly - 2 sets - 10 reps - Standing Terminal Knee Extension with Resistance  - 1 x daily - 7 x weekly - 2 sets - 10 reps - Standing Heel Raises  - 1 x daily - 7 x weekly - 2 sets - 10 reps - Sit to Stand  - 1 x daily - 7 x weekly - 1 sets - 10 reps  ASSESSMENT:  CLINICAL IMPRESSION: Significant improvement in knee ROM.  Patient able to progress weights/resistance in exercises today without difficulty or exacerbation of pain. Verbal and tactile cues for patellofemoral alignment during functional movements.      OBJECTIVE IMPAIRMENTS: Abnormal gait, decreased endurance, decreased mobility, difficulty walking, decreased ROM, decreased strength, increased edema, impaired flexibility, and pain.   ACTIVITY LIMITATIONS: sitting, standing, squatting, stairs, and transfers  PARTICIPATION LIMITATIONS: meal prep, driving, community activity, and occupation  PERSONAL FACTORS: Age are also affecting patient's functional outcome.   REHAB POTENTIAL: Good  CLINICAL DECISION MAKING: Stable/uncomplicated  EVALUATION COMPLEXITY: Moderate   GOALS: Goals reviewed with patient? Yes  SHORT TERM GOALS: Target date: 07/16/2024   Pt to be independent with HEP. Baseline: Goal status:met 10/14  2.  Decrease pain by 1 level. Baseline:  Goal status: met 10/14 3.  Pt able to phase out cane. Baseline:  Goal status:met 10/14  LONG TERM GOALS: Target date: 08/13/2024      Pt to be independent with self progressive HEP by time of discharge. Baseline:  Goal status: INITIAL  2.  Increase AROM to [facility] by discharge. Baseline: 4>90 Goal status: INITIAL  3.  Increase strength to at least 4+/5 by discharge. Baseline: 4-/5 Goal status: INITIAL  4.  Max pain to 2/10 or less with all activities. Baseline: 7/10 Goal status: INITIAL  5.  Normalize gait without AD. Baseline:  Goal status:  INITIAL  6.  Pt able to ambulate community distances without pain or AD. Baseline:  Goal status: INITIAL   PLAN:  PT FREQUENCY: 2x/week  PT DURATION: 8 weeks  PLANNED INTERVENTIONS: 97164- PT Re-evaluation, 97110-Therapeutic exercises, 97530- Therapeutic activity, [id number]- Neuromuscular re-education, 97535- Self Care, [id number]- Manual therapy, [phone]- Gait training, [phone]- Aquatic Therapy, [phone]- Electrical stimulation (unattended), 97016- Vasopneumatic device, Patient/Family education, Balance training, Stair training, Joint mobilization, Cryotherapy, and Moist heat  PLAN FOR NEXT SESSION: add ex progressions to HEP (step ups); Continue with progressive ROM and strengthening exercise  Glade Pesa, PT 07/09/24 10:11 AM Phone:  [phone] Fax: [phone]

## 2024-07-11 ENCOUNTER — Ambulatory Visit: Admitting: Physical Therapy

## 2024-07-11 DIAGNOSIS — R269 Unspecified abnormalities of gait and mobility: Secondary | ICD-10-CM | POA: Diagnosis not present

## 2024-07-11 DIAGNOSIS — M25661 Stiffness of right knee, not elsewhere classified: Secondary | ICD-10-CM | POA: Diagnosis not present

## 2024-07-11 DIAGNOSIS — M25561 Pain in right knee: Secondary | ICD-10-CM | POA: Diagnosis not present

## 2024-07-11 DIAGNOSIS — R600 Localized edema: Secondary | ICD-10-CM

## 2024-07-11 DIAGNOSIS — G8929 Other chronic pain: Secondary | ICD-10-CM | POA: Diagnosis not present

## 2024-07-11 NOTE — Therapy (Signed)
 OUTPATIENT PHYSICAL THERAPY LOWER EXTREMITY TREATMENT   Patient Name: Nancy Christensen MRN: [id number] DOB:1951-05-01, 73 y.o., female Today's Date: 07/11/2024  END OF SESSION:  PT End of Session - 07/11/24 0847     Visit Number 7    Date for Recertification  08/13/24    Authorization Type MCR    Progress Note Due on Visit 10    PT Start Time 0848    PT Stop Time 0928    PT Time Calculation (min) 40 min    Activity Tolerance Patient tolerated treatment well             Past Medical History:  Diagnosis Date   Arthritis 2019   left hip and knee   Atypical chest pain 10/25/2018   Cholecystitis with cholelithiasis 02/11/2018   Chronic right shoulder pain 07/04/2018   Constipation due to outlet dysfunction    Dizziness 10/25/2018   Dyslipidemia 10/25/2018   Essential hypertension 04/28/2018   Changed arb to CCB 04/24/2018 due to cough x one month trial    Hyperlipidemia    Hypertension    Hypertensive disorder 04/18/2018   Multiple pulmonary nodules determined by computed tomography of lung 01/26/2018   Passive smoke exp/ remote  Chest CT  09/24/17 MPN's  Largest 4 x 6 mm R laterally  > rec f/u 09/14/18 (reminder file)   As pt reports neg CT 2015 (not avail)  - ESR 01/25/2018 = 35  - CT chest 07/11/18 =  5 mm > no f/u needed    Primary osteoarthritis of left hip 11/26/2019   Primary osteoarthritis of left knee 03/11/2020   Rectal pain    Sinus bradycardia 10/25/2018   Unilateral primary osteoarthritis, left knee 06/14/2018   Upper airway cough syndrome 01/25/2018   Onset 1980 CT head 12/22/17 neg sinus dz FENO 01/25/2018  =   21 - Allergy  profile 01/25/2018 >  Eos 0.1 /  IgE  17 RAST neg  - flare 02/12/18 p et  - gabapentin  100 mg tid 03/23/2018 > increased to max of 300 tid 04/24/2018 > improved so increased to 300 qid 06/05/2018 > d/c'd around 02/2019 s flare    Past Surgical History:  Procedure Laterality Date   ABDOMINAL HYSTERECTOMY  1997   ANAL RECTAL MANOMETRY N/A  08/09/2019   Procedure: ANO RECTAL MANOMETRY;  Surgeon: Eda Iha, MD;  Location: [facility];  Service: Gastroenterology;  Laterality: N/A;   BREAST SURGERY  2002   left cyst removal    CHOLECYSTECTOMY N/A 02/12/2018   Procedure: LAPAROSCOPIC CHOLECYSTECTOMY;  Surgeon: Debby Hila, MD;  Location: [facility];  Service: General;  Laterality: N/A;   COLONOSCOPY  07/12/2019   POLYPECTOMY     Patient Active Problem List   Diagnosis Date Noted   Epistaxis 12/06/2023   Vitamin D  deficiency 06/27/2023   Puncture wound of toe of left foot 05/06/2022   Hypertension    Hyperlipidemia    Arthritis    Primary osteoarthritis of left knee 03/11/2020   Primary osteoarthritis of left hip 11/26/2019   Constipation due to outlet dysfunction    Rectal pain    Dizziness 10/25/2018   Sinus bradycardia 10/25/2018   Atypical chest pain 10/25/2018   Dyslipidemia 10/25/2018   Chronic right shoulder pain 07/04/2018   Essential hypertension 04/28/2018   Hypertensive disorder 04/18/2018   Cholecystitis with cholelithiasis 02/11/2018   Multiple pulmonary nodules determined by computed tomography of lung 01/26/2018   Upper airway cough syndrome 01/25/2018    PCP: Mercer Kirsch  JONELLE, MD   REFERRING PROVIDER: Jule Shuck PA   REFERRING DIAG: [phone] (ICD-10-CM) - S/P right knee arthroscopy 06/06/24  THERAPY DIAG:  Chronic pain of right knee  Stiffness of right knee, not elsewhere classified  Localized edema  Rationale for Evaluation and Treatment: Rehabilitation  ONSET DATE: 04/25/24; S/P right knee arthroscopy 06/06/24  SUBJECTIVE:   SUBJECTIVE STATEMENT: Did lots of errands yesterday and I was tired.  Some medial knee pain but not consistently. I need to work on coming down the stairs. Can we cut it short today, I'm really tired.   Eval-Pt states occasional R knee weakness.  04/25/24 she was stepping onto a train step and the knee gave out.  She was in [place] .  She went to [facility] and  then orthopedist.  She had xray and MRI which was + for torn meniscus.  She followed up with Dr Jerri and had surgery on 06/06/24.  PERTINENT HISTORY: R knee gel injections in the past but no surgery Left knee issues bone on bone  PAIN:  Are you having pain? Yes: NPRS scale: 0/10 Pain location: lateral and posterior Pain description: deep ache Aggravating factors: standing, lifting R LE Relieving factors: ice, meds  PRECAUTIONS: None  RED FLAGS: None   WEIGHT BEARING RESTRICTIONS: No  FALLS:  Has patient fallen in last 6 months? No  LIVING ENVIRONMENT: Lives with: lives alone Lives in: House/apartment Stairs: Yes: External: 12 steps; bilateral but cannot reach both Has following equipment at home: Single point cane  OCCUPATION: PT requires standing   PLOF: Independent  PATIENT GOALS: decrease pain, full activity  NEXT MD VISIT: 5 wks  OBJECTIVE:  Note: Objective measures were completed at Evaluation unless otherwise noted.  DIAGNOSTIC FINDINGS: 03/25/24  IMPRESSION: Slight degenerative changes.  PATIENT SURVEYS:  PSFS: THE PATIENT SPECIFIC FUNCTIONAL SCALE  Place score of 0-10 (0 = unable to perform activity and 10 = able to perform activity at the same level as before injury or problem)  Activity Date: 06/17/24    [facility] knee like for sitting 5    2.full weight bearing/walking 8    3.lifting leg to ascend stairs 5    4.      Total Score 6      Total Score = Sum of activity scores/number of activities  Minimally Detectable Change: 3 points (for single activity); 2 points (for average score)  [facility] (nd). The Patient Specific Functional Scale . Retrieved from [email or web address]   COGNITION: Overall cognitive status: Within functional limits for tasks assessed     SENSATION: WFL  EDEMA:  Circumferential: --50.5cm  MUSCLE LENGTH: Hamstrings: Right mod restriction deg  POSTURE: flexed  trunk   PALPATION: 2+ tenderness joint line  LOWER EXTREMITY ROM:  Active/Passive ROM Right eval Left eval Right 06/24/24 10/14  Hip flexion      Hip extension      Hip abduction      Hip adduction      Hip internal rotation      Hip external rotation      Knee flexion 90/102  113 137  Knee extension +4/0  0 0  Ankle dorsiflexion      Ankle plantarflexion      Ankle inversion      Ankle eversion       (Blank rows = not tested)  LOWER EXTREMITY MMT:  MMT Right eval Left eval  Hip flexion 4   Hip extension    Hip abduction  Hip adduction    Hip internal rotation    Hip external rotation    Knee flexion 4+   Knee extension 4-   Ankle dorsiflexion 4   Ankle plantarflexion    Ankle inversion    Ankle eversion     (Blank rows = not tested)  LOWER EXTREMITY SPECIAL TESTS:  No special tests completed due to post op status  FUNCTIONAL TESTS:  5 times sit to stand: 23 sec  GAIT: Distance walked: 100 Assistive device utilized: Single point cane Level of assistance: Complete Independence Comments: Pt needed VC for step through pattern and cane use in proper L hand.  TREATMENT DATE: 07/11/24 Nustep 5 min level 5 (while discussing status and response to treatment) Bike rocking at first then full revolutions 3 min total (pt has bike at home, discussed set up and frequency of use) LAQ 3#  x15 right/left Prone HS curls 2# x15  4 inch step ups 10x no UE support needed 4 inch step downs 10x no UE support needed  4 lateral step ups 10x with opposite hip abduction (challenges balance) WB on right: left hip 3 ways 15x Leg press seat 7 bil 60# 10x, increased to 65# 10x; 35# single leg 10x right/left   07/09/24 Nustep 8 min level 3 (while discussing status and response to treatment) Seated floor sliders 20x LAQ 2#  x15 Prone HS curls 2# x15  SLR 2# x10 2nd step flexion 10x 2nd step HS stretch 10x Standing heel raises bil 1x   Standing TKE blue band 5 sec hold  10x  4 inch step ups 10x no UE support needed 2 inch step downs 10x no UE support needed  Floor sliders: abduction and extension 5x right/left 2 rounds Leg press seat 7 bil 60# 15x 30# single leg 10x right/left  07/01/24 Nustep 7 min level 5 (while discussing status and response to treatment) Seated floor sliders 20x LAQ 10x2 Seated blue band clams 20x Seated quad isometric push green ball 5 sec hold 12x Standing heel raises bil 15x (Added to HEP- see below)  Standing TKE blue band 5 sec hold 10x (Added to HEP- see below) Standing at the base of the stairs: WB on right with step taps Sit to stand from very high table 10x 2 (Added to HEP- see below) balance   06/27/24 Nustep 7 min level 5 (while discussing status and response to treatment) Seated floor sliders 20x LAQ 10x2 Seated blue band clams 20x Seated quad isometric push green ball 5 sec hold 12x Standing heel raises bil 15x (Added to HEP- see below)  Standing TKE blue band 5 sec hold 10x (Added to HEP- see below) Standing at the base of the stairs: WB on right with step taps Sit to stand from very high table 10x 2 (Added to HEP- see below)      PATIENT EDUCATION:  Education details: HEP Person educated: Patient Education method: Explanation, Demonstration, Tactile cues, and Verbal cues Education comprehension: verbalized understanding, returned demonstration, and verbal cues required  HOME EXERCISE PROGRAM: Access Code: [email or web address] URL: [email or web address] Date: 07/11/2024 Prepared by: Glade Pesa  Exercises - Supine Heel Slide  - 2 x daily - 7 x weekly - 2 sets - 10 reps - Supine Quad Set  - 2 x daily - 7 x weekly - 2 sets - 10 reps - 2 hold - Supine Ankle Pumps  - 2 x daily - 7 x weekly - 3 sets - 10 reps - Hooklying Hamstring Stretch with  Strap  - 2 x daily - 7 x weekly - 2 sets - 10 reps - 25 hold - Seated Long Arc Quad  - 2 x daily - 7 x weekly - 2 sets - 10 reps - Supine Active Straight Leg  Raise  - 2 x daily - 7 x weekly - 2 sets - 10 reps - Sidelying Hip Abduction  - 1 x daily - 3 x weekly - 2 sets - 10 reps - Sidelying Hip Adduction  - 1 x daily - 3 x weekly - 2 sets - 10 reps - Standing Terminal Knee Extension with Resistance  - 1 x daily - 7 x weekly - 2 sets - 10 reps - Standing Heel Raises  - 1 x daily - 7 x weekly - 2 sets - 10 reps - Sit to Stand  - 1 x daily - 7 x weekly - 1 sets - 10 reps - Long Sitting Hip Internal Rotation stretch  (TFL)  - 2 x daily - 7 x weekly - 1 sets - 3 reps - 30 sec hold - Forward Step Up  - 1 x daily - 7 x weekly - 1 sets - 10 reps ASSESSMENT:  CLINICAL IMPRESSION: Improving knee ROM with patient able to make a full revolution on the bike. Able to increase step down height on step downs indicating improved quad motor control.  Able able to increase resistance on the leg press for both double and single leg with cues for patellofemoral alignment and to press more with heels to activate quads. She reports more energy at the end of the session than when she came in today.   OBJECTIVE IMPAIRMENTS: Abnormal gait, decreased endurance, decreased mobility, difficulty walking, decreased ROM, decreased strength, increased edema, impaired flexibility, and pain.   ACTIVITY LIMITATIONS: sitting, standing, squatting, stairs, and transfers  PARTICIPATION LIMITATIONS: meal prep, driving, community activity, and occupation  PERSONAL FACTORS: Age are also affecting patient's functional outcome.   REHAB POTENTIAL: Good  CLINICAL DECISION MAKING: Stable/uncomplicated  EVALUATION COMPLEXITY: Moderate   GOALS: Goals reviewed with patient? Yes  SHORT TERM GOALS: Target date: 07/16/2024   Pt to be independent with HEP. Baseline: Goal status:met 10/14  2.  Decrease pain by 1 level. Baseline:  Goal status: met 10/14 3.  Pt able to phase out cane. Baseline:  Goal status:met 10/14  LONG TERM GOALS: Target date: 08/13/2024      Pt to be  independent with self progressive HEP by time of discharge. Baseline:  Goal status: INITIAL  2.  Increase AROM to [facility] by discharge. Baseline: 4>90 Goal status: INITIAL  3.  Increase strength to at least 4+/5 by discharge. Baseline: 4-/5 Goal status: INITIAL  4.  Max pain to 2/10 or less with all activities. Baseline: 7/10 Goal status: INITIAL  5.  Normalize gait without AD. Baseline:  Goal status: INITIAL  6.  Pt able to ambulate community distances without pain or AD. Baseline:  Goal status: INITIAL   PLAN:  PT FREQUENCY: 2x/week  PT DURATION: 8 weeks  PLANNED INTERVENTIONS: 97164- PT Re-evaluation, 97110-Therapeutic exercises, 97530- Therapeutic activity, 97112- Neuromuscular re-education, 97535- Self Care, [id number]- Manual therapy, [phone]- Gait training, [phone]- Aquatic Therapy, [phone]- Electrical stimulation (unattended), 97016- Vasopneumatic device, Patient/Family education, Balance training, Stair training, Joint mobilization, Cryotherapy, and Moist heat  PLAN FOR NEXT SESSION: Continue with progressive ROM, proprioceptive and strengthening exercise; bike full revolutions Glade Pesa, PT 07/11/24 9:30 AM Phone: [phone] Fax: [phone]

## 2024-07-16 ENCOUNTER — Encounter: Payer: Self-pay | Admitting: Physical Therapy

## 2024-07-16 ENCOUNTER — Ambulatory Visit: Admitting: Physical Therapy

## 2024-07-16 DIAGNOSIS — R600 Localized edema: Secondary | ICD-10-CM | POA: Diagnosis not present

## 2024-07-16 DIAGNOSIS — M25661 Stiffness of right knee, not elsewhere classified: Secondary | ICD-10-CM

## 2024-07-16 DIAGNOSIS — G8929 Other chronic pain: Secondary | ICD-10-CM | POA: Diagnosis not present

## 2024-07-16 DIAGNOSIS — M25561 Pain in right knee: Secondary | ICD-10-CM | POA: Diagnosis not present

## 2024-07-16 DIAGNOSIS — R269 Unspecified abnormalities of gait and mobility: Secondary | ICD-10-CM | POA: Diagnosis not present

## 2024-07-16 NOTE — Therapy (Signed)
 OUTPATIENT PHYSICAL THERAPY LOWER EXTREMITY TREATMENT   Patient Name: Nancy Christensen MRN: [id number] DOB:1951-03-30, 73 y.o., female Today's Date: 07/16/2024  END OF SESSION:  PT End of Session - 07/16/24 1022     Visit Number 8    Number of Visits 17    Date for Recertification  08/13/24    Authorization Type MCR    Progress Note Due on Visit 10    PT Start Time 1018    PT Stop Time 1100    PT Time Calculation (min) 42 min    Activity Tolerance Patient tolerated treatment well    Behavior During Therapy [facility] for tasks assessed/performed              Past Medical History:  Diagnosis Date   Arthritis 2019   left hip and knee   Atypical chest pain 10/25/2018   Cholecystitis with cholelithiasis 02/11/2018   Chronic right shoulder pain 07/04/2018   Constipation due to outlet dysfunction    Dizziness 10/25/2018   Dyslipidemia 10/25/2018   Essential hypertension 04/28/2018   Changed arb to CCB 04/24/2018 due to cough x one month trial    Hyperlipidemia    Hypertension    Hypertensive disorder 04/18/2018   Multiple pulmonary nodules determined by computed tomography of lung 01/26/2018   Passive smoke exp/ remote  Chest CT  09/24/17 MPN's  Largest 4 x 6 mm R laterally  > rec f/u 09/14/18 (reminder file)   As pt reports neg CT 2015 (not avail)  - ESR 01/25/2018 = 35  - CT chest 07/11/18 =  5 mm > no f/u needed    Primary osteoarthritis of left hip 11/26/2019   Primary osteoarthritis of left knee 03/11/2020   Rectal pain    Sinus bradycardia 10/25/2018   Unilateral primary osteoarthritis, left knee 06/14/2018   Upper airway cough syndrome 01/25/2018   Onset 1980 CT head 12/22/17 neg sinus dz FENO 01/25/2018  =   21 - Allergy  profile 01/25/2018 >  Eos 0.1 /  IgE  17 RAST neg  - flare 02/12/18 p et  - gabapentin  100 mg tid 03/23/2018 > increased to max of 300 tid 04/24/2018 > improved so increased to 300 qid 06/05/2018 > d/c'd around 02/2019 s flare    Past Surgical History:  Procedure  Laterality Date   ABDOMINAL HYSTERECTOMY  1997   ANAL RECTAL MANOMETRY N/A 08/09/2019   Procedure: ANO RECTAL MANOMETRY;  Surgeon: Eda Iha, MD;  Location: [facility];  Service: Gastroenterology;  Laterality: N/A;   BREAST SURGERY  2002   left cyst removal    CHOLECYSTECTOMY N/A 02/12/2018   Procedure: LAPAROSCOPIC CHOLECYSTECTOMY;  Surgeon: Debby Hila, MD;  Location: [facility];  Service: General;  Laterality: N/A;   COLONOSCOPY  07/12/2019   POLYPECTOMY     Patient Active Problem List   Diagnosis Date Noted   Epistaxis 12/06/2023   Vitamin D  deficiency 06/27/2023   Puncture wound of toe of left foot 05/06/2022   Hypertension    Hyperlipidemia    Arthritis    Primary osteoarthritis of left knee 03/11/2020   Primary osteoarthritis of left hip 11/26/2019   Constipation due to outlet dysfunction    Rectal pain    Dizziness 10/25/2018   Sinus bradycardia 10/25/2018   Atypical chest pain 10/25/2018   Dyslipidemia 10/25/2018   Chronic right shoulder pain 07/04/2018   Essential hypertension 04/28/2018   Hypertensive disorder 04/18/2018   Cholecystitis with cholelithiasis 02/11/2018   Multiple pulmonary nodules determined by  computed tomography of lung 01/26/2018   Upper airway cough syndrome 01/25/2018    PCP: Mercer Clotilda SAUNDERS, MD   REFERRING PROVIDER: Jule Shuck PA   REFERRING DIAG: [phone] (ICD-10-CM) - S/P right knee arthroscopy 06/06/24  THERAPY DIAG:  Chronic pain of right knee  Stiffness of right knee, not elsewhere classified  Localized edema  Altered gait  Rationale for Evaluation and Treatment: Rehabilitation  ONSET DATE: 04/25/24; S/P right knee arthroscopy 06/06/24  SUBJECTIVE:   SUBJECTIVE STATEMENT: Feel some pain in my lateral knee when I ER my hip like when I get into bed.  Eval-Pt states occasional R knee weakness.  04/25/24 she was stepping onto a train step and the knee gave out.  She was in [place] .  She went to [facility] and then  orthopedist.  She had xray and MRI which was + for torn meniscus.  She followed up with Dr Jerri and had surgery on 06/06/24.  PERTINENT HISTORY: R knee gel injections in the past but no surgery Left knee issues bone on bone  PAIN:  Are you having pain? Yes: NPRS scale: 0/10 Pain location: lateral and posterior Pain description: deep ache Aggravating factors: standing, lifting R LE Relieving factors: ice, meds  PRECAUTIONS: None  RED FLAGS: None   WEIGHT BEARING RESTRICTIONS: No  FALLS:  Has patient fallen in last 6 months? No  LIVING ENVIRONMENT: Lives with: lives alone Lives in: House/apartment Stairs: Yes: External: 12 steps; bilateral but cannot reach both Has following equipment at home: Single point cane  OCCUPATION: PT requires standing   PLOF: Independent  PATIENT GOALS: decrease pain, full activity  NEXT MD VISIT: 5 wks  OBJECTIVE:  Note: Objective measures were completed at Evaluation unless otherwise noted.  DIAGNOSTIC FINDINGS: 03/25/24  IMPRESSION: Slight degenerative changes.  PATIENT SURVEYS:  PSFS: THE PATIENT SPECIFIC FUNCTIONAL SCALE  Place score of 0-10 (0 = unable to perform activity and 10 = able to perform activity at the same level as before injury or problem)  Activity Date: 06/17/24 07/16/24   Bend knee like for sitting 5 9   2.full weight bearing/walking 8 9   3.lifting leg to ascend stairs 5 10   4.      Total Score 6 9.3     Total Score = Sum of activity scores/number of activities  Minimally Detectable Change: 3 points (for single activity); 2 points (for average score)  [facility] (nd). The Patient Specific Functional Scale . Retrieved from [email or web address]   COGNITION: Overall cognitive status: Within functional limits for tasks assessed     SENSATION: WFL  EDEMA:  Circumferential: --50.5cm  MUSCLE LENGTH: Hamstrings: Right mod restriction  deg  POSTURE: flexed trunk   PALPATION: 2+ tenderness joint line  LOWER EXTREMITY ROM:  Active/Passive ROM Right eval Left eval Right 06/24/24 10/14  Hip flexion      Hip extension      Hip abduction      Hip adduction      Hip internal rotation      Hip external rotation      Knee flexion 90/102  113 137  Knee extension +4/0  0 0  Ankle dorsiflexion      Ankle plantarflexion      Ankle inversion      Ankle eversion       (Blank rows = not tested)  LOWER EXTREMITY MMT:  MMT Right eval Left eval Right 10/21  Hip flexion 4  4+  Hip extension  Hip abduction     Hip adduction     Hip internal rotation     Hip external rotation     Knee flexion 4+  5  Knee extension 4-  5-  Ankle dorsiflexion 4    Ankle plantarflexion     Ankle inversion     Ankle eversion      (Blank rows = not tested)  LOWER EXTREMITY SPECIAL TESTS:  No special tests completed due to post op status  FUNCTIONAL TESTS:  5 times sit to stand: 23 sec  GAIT: Distance walked: 100 Assistive device utilized: Single point cane Level of assistance: Complete Independence Comments: Pt needed VC for step through pattern and cane use in proper L hand.  TREATMENT DATE: 07/16/24 Nustep 7 min level 5 (while discussing status and response to treatment) Supine ITB stretch with strap 2x30 sec - feels some tightness behind knee after LAQ 4#  x15 right/left Prone HS curls 4# x15  Prone quad stretch 2x 30 sec Stairs - worked on step downs leading with left and eccentric R quad control  4 inch heel tap R laterally x 12 4 inch fwd heel tap x 12 4 lateral step ups 10x with opposite hip abduction (challenges balance) Leg press seat 7 bil 65#  20x; 35# single leg 10x right/left  07/11/24 Nustep 5 min level 5 (while discussing status and response to treatment) Bike rocking at first then full revolutions 3 min total (pt has bike at home, discussed set up and frequency of use) LAQ 3#  x15 right/left Prone  HS curls 2# x15  4 inch step ups 10x no UE support needed 4 inch step downs 10x no UE support needed  4 lateral step ups 10x with opposite hip abduction (challenges balance) WB on right: left hip 3 ways 15x Leg press seat 7 bil 60# 10x, increased to 65# 10x; 35# single leg 10x right/left   07/09/24 Nustep 8 min level 3 (while discussing status and response to treatment) Seated floor sliders 20x LAQ 2#  x15 Prone HS curls 2# x15  SLR 2# x10 2nd step flexion 10x 2nd step HS stretch 10x Standing heel raises bil 1x   Standing TKE blue band 5 sec hold 10x  4 inch step ups 10x no UE support needed 2 inch step downs 10x no UE support needed  Floor sliders: abduction and extension 5x right/left 2 rounds Leg press seat 7 bil 60# 15x 30# single leg 10x right/left  07/01/24 Nustep 7 min level 5 (while discussing status and response to treatment) Seated floor sliders 20x LAQ 10x2 Seated blue band clams 20x Seated quad isometric push green ball 5 sec hold 12x Standing heel raises bil 15x (Added to HEP- see below)  Standing TKE blue band 5 sec hold 10x (Added to HEP- see below) Standing at the base of the stairs: WB on right with step taps Sit to stand from very high table 10x 2 (Added to HEP- see below) balance   06/27/24 Nustep 7 min level 5 (while discussing status and response to treatment) Seated floor sliders 20x LAQ 10x2 Seated blue band clams 20x Seated quad isometric push green ball 5 sec hold 12x Standing heel raises bil 15x (Added to HEP- see below)  Standing TKE blue band 5 sec hold 10x (Added to HEP- see below) Standing at the base of the stairs: WB on right with step taps Sit to stand from very high table 10x 2 (Added to HEP- see below)  PATIENT EDUCATION:  Education details: HEP Person educated: Patient Education method: Explanation, Demonstration, Actor cues, and Verbal cues Education comprehension: verbalized understanding, returned demonstration, and  verbal cues required  HOME EXERCISE PROGRAM: Access Code: [email or web address] URL: [email or web address] Date: 07/11/2024 Prepared by: Glade Pesa  Exercises - Supine Heel Slide  - 2 x daily - 7 x weekly - 2 sets - 10 reps - Supine Quad Set  - 2 x daily - 7 x weekly - 2 sets - 10 reps - 2 hold - Supine Ankle Pumps  - 2 x daily - 7 x weekly - 3 sets - 10 reps - Hooklying Hamstring Stretch with Strap  - 2 x daily - 7 x weekly - 2 sets - 10 reps - 25 hold - Seated Long Arc Quad  - 2 x daily - 7 x weekly - 2 sets - 10 reps - Supine Active Straight Leg Raise  - 2 x daily - 7 x weekly - 2 sets - 10 reps - Sidelying Hip Abduction  - 1 x daily - 3 x weekly - 2 sets - 10 reps - Sidelying Hip Adduction  - 1 x daily - 3 x weekly - 2 sets - 10 reps - Standing Terminal Knee Extension with Resistance  - 1 x daily - 7 x weekly - 2 sets - 10 reps - Standing Heel Raises  - 1 x daily - 7 x weekly - 2 sets - 10 reps - Sit to Stand  - 1 x daily - 7 x weekly - 1 sets - 10 reps - Long Sitting Hip Internal Rotation stretch  (TFL)  - 2 x daily - 7 x weekly - 1 sets - 3 reps - 30 sec hold - Forward Step Up  - 1 x daily - 7 x weekly - 1 sets - 10 reps ASSESSMENT:  CLINICAL IMPRESSION: Patient has met most of her LTGs. She still has up to 3/10 pain at times. She still reports some difficulty with stairs, but after discussion this seems to be more because of left knee arthritis. We focused on R knee eccentric quad control with descending stairs today. She did well with no complaints of pain. She sees the MD Thursday and may be ready to d/c next visit unless patient feels she needs more work on the stairs. She does have 12-15 stairs to climb to get to her apartment.    OBJECTIVE IMPAIRMENTS: Abnormal gait, decreased endurance, decreased mobility, difficulty walking, decreased ROM, decreased strength, increased edema, impaired flexibility, and pain.   ACTIVITY LIMITATIONS: sitting, standing, squatting, stairs,  and transfers  PARTICIPATION LIMITATIONS: meal prep, driving, community activity, and occupation  PERSONAL FACTORS: Age are also affecting patient's functional outcome.   REHAB POTENTIAL: Good  CLINICAL DECISION MAKING: Stable/uncomplicated  EVALUATION COMPLEXITY: Moderate   GOALS: Goals reviewed with patient? Yes  SHORT TERM GOALS: Target date: 07/16/2024   Pt to be independent with HEP. Baseline: Goal status:met 10/14  2.  Decrease pain by 1 level. Baseline:  Goal status: met 10/14 3.  Pt able to phase out cane. Baseline:  Goal status:met 10/14  LONG TERM GOALS: Target date: 08/13/2024      Pt to be independent with self progressive HEP by time of discharge. Baseline:  Goal status: INITIAL  2.  Increase AROM to [facility] by discharge. Baseline: 4>90 Goal status: MET  3.  Increase strength to at least 4+/5 by discharge. Baseline: 4-/5 Goal status: MET  4.  Max pain to 2/10 or  less with all activities. Baseline: 7/10 Goal status: IN PROGRESS 2-3 07/16/24  5.  Normalize gait without AD. Baseline:  Goal status: MET 10/21   6.  Pt able to ambulate community distances without pain or AD. Baseline:  Goal status: MET 10/21   PLAN:  PT FREQUENCY: 2x/week  PT DURATION: 8 weeks  PLANNED INTERVENTIONS: 97164- PT Re-evaluation, 97110-Therapeutic exercises, 97530- Therapeutic activity, 97112- Neuromuscular re-education, 97535- Self Care, 02859- Manual therapy, [phone]- Gait training, [phone]- Aquatic Therapy, [phone]- Electrical stimulation (unattended), 97016- Vasopneumatic device, Patient/Family education, Balance training, Stair training, Joint mobilization, Cryotherapy, and Moist heat  PLAN FOR NEXT SESSION: Finalize HEP for eccentric strengthening for stairs. Possible discharge - discuss with pt.  Mliss Cummins, PT  07/16/24 1:40 PM Phone: [phone] Fax: [phone]

## 2024-07-17 ENCOUNTER — Encounter

## 2024-07-18 ENCOUNTER — Ambulatory Visit: Admitting: Orthopaedic Surgery

## 2024-07-18 DIAGNOSIS — M1712 Unilateral primary osteoarthritis, left knee: Secondary | ICD-10-CM

## 2024-07-18 DIAGNOSIS — Z9889 Other specified postprocedural states: Secondary | ICD-10-CM

## 2024-07-18 MED ORDER — HYALURONAN 88 MG/4ML IX SOSY
88.0000 mg | PREFILLED_SYRINGE | INTRA_ARTICULAR | Status: AC | PRN
  Administered 2024-07-18: 88 mg via INTRA_ARTICULAR

## 2024-07-18 NOTE — Progress Notes (Signed)
 Office Visit Note   Patient: Nancy Christensen           Date of Birth: July 02, 1951           MRN: [id number] Visit Date: 07/18/2024              Requested by: Mercer Clotilda SAUNDERS, MD [address] [place],  [place] [address] PCP: Mercer Clotilda SAUNDERS, MD   Assessment & Plan: Visit Diagnoses:  1. S/P right knee arthroscopy   2. Primary osteoarthritis of left knee     Plan: History of Present Illness Nancy Christensen is a 73 year old female who presents for follow-up after knee surgery and ongoing management of right knee arthritis.  She underwent arthroscopic knee surgery on June 06, 2024, with significant improvement in knee pain post-surgery. She is satisfied with the outcome.  She is currently engaged in physical therapy, which is beneficial for muscle strengthening and mobility. She experiences minimal residual discomfort, which is intermittent.  Intermittent pain occurs on the lateral side of her knee, likely due to arthritis. She also experiences pain on the medial side of her left knee, which is the most painful area. She takes an over-the-counter arthritis medication but cannot recall the name.  Examination shows fully healed surgical scars.  Range of motion has returned back to normal. Exam of the left knee shows crepitus with range of motion.  Normal range of motion.  Collaterals and cruciates are stable.  Assessment and Plan Status post right knee scope PLM - She is doing well and has had very good pain relief. - Increase activity as tolerated.  Left knee osteoarthritis Chronic left knee osteoarthritis with significant medial pain, confirmed as bone on bone on x-ray. - Obtain approval for gel injection for the left knee. This patient is diagnosed with osteoarthritis of the knee(s).    Radiographs show evidence of joint space narrowing, osteophytes, subchondral sclerosis and/or subchondral cysts.  This patient has knee pain which interferes with functional and  activities of daily living.    This patient has experienced inadequate response, adverse effects and/or intolerance with conservative treatments such as acetaminophen , NSAIDS, topical creams, physical therapy or regular exercise, knee bracing and/or weight loss.   This patient has experienced inadequate response or has a contraindication to intra articular steroid injections for at least 3 months.   This patient is not scheduled to have a total knee replacement within 6 months of starting treatment with viscosupplementation.   Follow-Up Instructions: No follow-ups on file.   Orders:  No orders of the defined types were placed in this encounter.  No orders of the defined types were placed in this encounter.     Procedures: Large Joint Inj: L knee on 07/18/2024 10:28 AM Indications: pain Details: 22 G needle  Arthrogram: No  Medications: 88 mg Hyaluronan 88 MG/4ML Outcome: tolerated well, no immediate complications Patient was prepped and draped in the usual sterile fashion.       Clinical Data: No additional findings.   Subjective: Chief Complaint  Patient presents with   Right Knee - Pain     Past Medical History:  Diagnosis Date   Arthritis 2019   left hip and knee   Atypical chest pain 10/25/2018   Cholecystitis with cholelithiasis 02/11/2018   Chronic right shoulder pain 07/04/2018   Constipation due to outlet dysfunction    Dizziness 10/25/2018   Dyslipidemia 10/25/2018   Essential hypertension 04/28/2018   Changed arb to CCB 04/24/2018 due  to cough x one month trial    Hyperlipidemia    Hypertension    Hypertensive disorder 04/18/2018   Multiple pulmonary nodules determined by computed tomography of lung 01/26/2018   Passive smoke exp/ remote  Chest CT  09/24/17 MPN's  Largest 4 x 6 mm R laterally  > rec f/u 09/14/18 (reminder file)   As pt reports neg CT 2015 (not avail)  - ESR 01/25/2018 = 35  - CT chest 07/11/18 =  5 mm > no f/u needed    Primary  osteoarthritis of left hip 11/26/2019   Primary osteoarthritis of left knee 03/11/2020   Rectal pain    Sinus bradycardia 10/25/2018   Unilateral primary osteoarthritis, left knee 06/14/2018   Upper airway cough syndrome 01/25/2018   Onset 1980 CT head 12/22/17 neg sinus dz FENO 01/25/2018  =   21 - Allergy  profile 01/25/2018 >  Eos 0.1 /  IgE  17 RAST neg  - flare 02/12/18 p et  - gabapentin  100 mg tid 03/23/2018 > increased to max of 300 tid 04/24/2018 > improved so increased to 300 qid 06/05/2018 > d/c'd around 02/2019 s flare     Family History  Problem Relation Age of Onset   Diabetes Mother    Hypertension Mother    Thyroid  disease Mother    Vascular Disease Mother    Varicose Veins Mother    Lung cancer Father    Colon cancer Maternal Uncle    Kidney disease Sister    Colon polyps Neg Hx    Esophageal cancer Neg Hx    Stomach cancer Neg Hx    Rectal cancer Neg Hx    Breast cancer Neg Hx     Past Surgical History:  Procedure Laterality Date   ABDOMINAL HYSTERECTOMY  1997   ANAL RECTAL MANOMETRY N/A 08/09/2019   Procedure: ANO RECTAL MANOMETRY;  Surgeon: Eda Iha, MD;  Location: [facility];  Service: Gastroenterology;  Laterality: N/A;   BREAST SURGERY  2002   left cyst removal    CHOLECYSTECTOMY N/A 02/12/2018   Procedure: LAPAROSCOPIC CHOLECYSTECTOMY;  Surgeon: Debby Hila, MD;  Location: [facility];  Service: General;  Laterality: N/A;   COLONOSCOPY  07/12/2019   POLYPECTOMY     Social History   Occupational History   Not on file  Tobacco Use   Smoking status: Never   Smokeless tobacco: Never  Vaping Use   Vaping status: Never Used  Substance and Sexual Activity   Alcohol use: Never   Drug use: Never   Sexual activity: Not Currently    Birth control/protection: Post-menopausal

## 2024-07-19 ENCOUNTER — Ambulatory Visit: Admitting: Physical Therapy

## 2024-07-19 DIAGNOSIS — M25561 Pain in right knee: Secondary | ICD-10-CM | POA: Diagnosis not present

## 2024-07-19 DIAGNOSIS — M25661 Stiffness of right knee, not elsewhere classified: Secondary | ICD-10-CM

## 2024-07-19 DIAGNOSIS — R600 Localized edema: Secondary | ICD-10-CM | POA: Diagnosis not present

## 2024-07-19 DIAGNOSIS — G8929 Other chronic pain: Secondary | ICD-10-CM | POA: Diagnosis not present

## 2024-07-19 DIAGNOSIS — R269 Unspecified abnormalities of gait and mobility: Secondary | ICD-10-CM | POA: Diagnosis not present

## 2024-07-19 NOTE — Therapy (Signed)
 OUTPATIENT PHYSICAL THERAPY LOWER EXTREMITY TREATMENT/DISCHARGE SUMMARY   Patient Name: Nancy Christensen MRN: [id number] DOB:[birth year], 73 y.o., female Today's Date: 07/19/2024  END OF SESSION:  PT End of Session - 07/19/24 1020     Visit Number 9    Date for Recertification  08/13/24    Authorization Type MCR    Progress Note Due on Visit 10    PT Start Time 1020    PT Stop Time 1059    PT Time Calculation (min) 39 min    Activity Tolerance Patient tolerated treatment well              Past Medical History:  Diagnosis Date   Arthritis 2019   left hip and knee   Atypical chest pain 10/25/2018   Cholecystitis with cholelithiasis 02/11/2018   Chronic right shoulder pain 07/04/2018   Constipation due to outlet dysfunction    Dizziness 10/25/2018   Dyslipidemia 10/25/2018   Essential hypertension 04/28/2018   Changed arb to CCB 04/24/2018 due to cough x one month trial    Hyperlipidemia    Hypertension    Hypertensive disorder 04/18/2018   Multiple pulmonary nodules determined by computed tomography of lung 01/26/2018   Passive smoke exp/ remote  Chest CT  09/24/17 MPN's  Largest 4 x 6 mm R laterally  > rec f/u 09/14/18 (reminder file)   As pt reports neg CT 2015 (not avail)  - ESR 01/25/2018 = 35  - CT chest 07/11/18 =  5 mm > no f/u needed    Primary osteoarthritis of left hip 11/26/2019   Primary osteoarthritis of left knee 03/11/2020   Rectal pain    Sinus bradycardia 10/25/2018   Unilateral primary osteoarthritis, left knee 06/14/2018   Upper airway cough syndrome 01/25/2018   Onset 1980 CT head 12/22/17 neg sinus dz FENO 01/25/2018  =   21 - Allergy  profile 01/25/2018 >  Eos 0.1 /  IgE  17 RAST neg  - flare 02/12/18 p et  - gabapentin  100 mg tid 03/23/2018 > increased to max of 300 tid 04/24/2018 > improved so increased to 300 qid 06/05/2018 > d/c'd around 02/2019 s flare    Past Surgical History:  Procedure Laterality Date   ABDOMINAL HYSTERECTOMY  1997   ANAL RECTAL  MANOMETRY N/A 08/09/2019   Procedure: ANO RECTAL MANOMETRY;  Surgeon: Eda Iha, MD;  Location: [facility];  Service: Gastroenterology;  Laterality: N/A;   BREAST SURGERY  2002   left cyst removal    CHOLECYSTECTOMY N/A 02/12/2018   Procedure: LAPAROSCOPIC CHOLECYSTECTOMY;  Surgeon: Debby Hila, MD;  Location: [facility];  Service: General;  Laterality: N/A;   COLONOSCOPY  07/12/2019   POLYPECTOMY     Patient Active Problem List   Diagnosis Date Noted   Epistaxis 12/06/2023   Vitamin D  deficiency 06/27/2023   Puncture wound of toe of left foot 05/06/2022   Hypertension    Hyperlipidemia    Arthritis    Primary osteoarthritis of left knee 03/11/2020   Primary osteoarthritis of left hip 11/26/2019   Constipation due to outlet dysfunction    Rectal pain    Dizziness 10/25/2018   Sinus bradycardia 10/25/2018   Atypical chest pain 10/25/2018   Dyslipidemia 10/25/2018   Chronic right shoulder pain 07/04/2018   Essential hypertension 04/28/2018   Hypertensive disorder 04/18/2018   Cholecystitis with cholelithiasis 02/11/2018   Multiple pulmonary nodules determined by computed tomography of lung 01/26/2018   Upper airway cough syndrome 01/25/2018    PCP:  Mercer Clotilda SAUNDERS, MD   REFERRING PROVIDER: Jule Shuck PA   REFERRING DIAG: [phone] (ICD-10-CM) - S/P right knee arthroscopy 06/06/24  THERAPY DIAG:  Chronic pain of right knee  Stiffness of right knee, not elsewhere classified  Rationale for Evaluation and Treatment: Rehabilitation  ONSET DATE: 04/25/24; S/P right knee arthroscopy 06/06/24  SUBJECTIVE:   SUBJECTIVE STATEMENT: My right knee is good!  I had a left knee gel injection yesterday and I'm stiff today.  I think today can be the final one. Eval-Pt states occasional R knee weakness.  04/25/24 she was stepping onto a train step and the knee gave out.  She was in [place] .  She went to [facility] and then orthopedist.  She had xray and MRI which was + for torn  meniscus.  She followed up with Dr Jerri and had surgery on 06/06/24.  PERTINENT HISTORY: R knee gel injections in the past but no surgery Left knee issues bone on bone  PAIN:  Are you having pain? Yes: NPRS scale: 0/10 Pain location: lateral and posterior Pain description: deep ache Aggravating factors: standing, lifting R LE Relieving factors: ice, meds  PRECAUTIONS: None  RED FLAGS: None   WEIGHT BEARING RESTRICTIONS: No  FALLS:  Has patient fallen in last 6 months? No  LIVING ENVIRONMENT: Lives with: lives alone Lives in: House/apartment Stairs: Yes: External: 12 steps; bilateral but cannot reach both Has following equipment at home: Single point cane  OCCUPATION: PT requires standing   PLOF: Independent  PATIENT GOALS: decrease pain, full activity  NEXT MD VISIT: 5 wks  OBJECTIVE:  Note: Objective measures were completed at Evaluation unless otherwise noted.  DIAGNOSTIC FINDINGS: 03/25/24  IMPRESSION: Slight degenerative changes.  PATIENT SURVEYS:  PSFS: THE PATIENT SPECIFIC FUNCTIONAL SCALE  Place score of 0-10 (0 = unable to perform activity and 10 = able to perform activity at the same level as before injury or problem)  Activity Date: 06/17/24 07/16/24 10/24  Bend knee like for sitting 5 9 10   2.full weight bearing/walking 8 9 10   3.lifting leg to ascend stairs 5 10 10   4.      Total Score 6 9.3     Total Score = Sum of activity scores/number of activities  Minimally Detectable Change: 3 points (for single activity); 2 points (for average score)  [facility] (nd). The Patient Specific Functional Scale . Retrieved from [email or web address]   COGNITION: Overall cognitive status: Within functional limits for tasks assessed     SENSATION: WFL  EDEMA:  Circumferential: --50.5cm  MUSCLE LENGTH: Hamstrings: Right mod restriction deg  POSTURE: flexed trunk   PALPATION: 2+  tenderness joint line  LOWER EXTREMITY ROM:  Active/Passive ROM Right eval Left eval Right 06/24/24 10/14 10/24   Hip flexion       Hip extension       Hip abduction       Hip adduction       Hip internal rotation       Hip external rotation       Knee flexion 90/102  113 137 140  Knee extension +4/0  0 0 0  Ankle dorsiflexion       Ankle plantarflexion       Ankle inversion       Ankle eversion        (Blank rows = not tested)  LOWER EXTREMITY MMT:  MMT Right eval Left eval Right 10/21  Hip flexion 4  4+  Hip extension  Hip abduction     Hip adduction     Hip internal rotation     Hip external rotation     Knee flexion 4+  5  Knee extension 4-  5-  Ankle dorsiflexion 4    Ankle plantarflexion     Ankle inversion     Ankle eversion      (Blank rows = not tested)  LOWER EXTREMITY SPECIAL TESTS:  No special tests completed due to post op status  FUNCTIONAL TESTS:  5 times sit to stand: 23 sec 10/24: 18.29  GAIT: Distance walked: 100 Assistive device utilized: Single point cane Level of assistance: Complete Independence Comments: Pt needed VC for step through pattern and cane use in proper L hand.  TREATMENT DATE: 07/19/24 Nustep 7 min level 5 (while discussing status and response to treatment) Knee ROM 5x  STS 4 inch step downs 20x (Added to HEP- see below) Seated green band resistance HS curls 15x (Added to HEP- see below) Standing wall hip abduction 10x left pushing/WB on right (Added to HEP- see below) Offset heel raises 75% weight on right 15x LAQ 4#  x15 right/left 4 lateral step ups 10x with opposite hip abduction (challenges balance) Leg press seat 7 bil 65#  20x; 35# single leg 10x right/left Review of HEP   07/16/24 Nustep 7 min level 5 (while discussing status and response to treatment) Supine ITB stretch with strap 2x30 sec - feels some tightness behind knee after LAQ 4#  x15 right/left Prone HS curls 4# x15  Prone quad stretch 2x 30  sec Stairs - worked on step downs leading with left and eccentric R quad control  4 inch heel tap R laterally x 12 4 inch fwd heel tap x 12 4 lateral step ups 10x with opposite hip abduction (challenges balance) Leg press seat 7 bil 65#  20x; 35# single leg 10x right/left  07/11/24 Nustep 5 min level 5 (while discussing status and response to treatment) Bike rocking at first then full revolutions 3 min total (pt has bike at home, discussed set up and frequency of use) LAQ 3#  x15 right/left Prone HS curls 2# x15  4 inch step ups 10x no UE support needed 4 inch step downs 10x no UE support needed  4 lateral step ups 10x with opposite hip abduction (challenges balance) WB on right: left hip 3 ways 15x Leg press seat 7 bil 60# 10x, increased to 65# 10x; 35# single leg 10x right/left   07/09/24 Nustep 8 min level 3 (while discussing status and response to treatment) Seated floor sliders 20x LAQ 2#  x15 Prone HS curls 2# x15  SLR 2# x10 2nd step flexion 10x 2nd step HS stretch 10x Standing heel raises bil 1x   Standing TKE blue band 5 sec hold 10x  4 inch step ups 10x no UE support needed 2 inch step downs 10x no UE support needed  Floor sliders: abduction and extension 5x right/left 2 rounds Leg press seat 7 bil 60# 15x 30# single leg 10x right/left  PATIENT EDUCATION:  Education details: HEP Person educated: Patient Education method: Programmer, multimedia, Demonstration, Actor cues, and Verbal cues Education comprehension: verbalized understanding, returned demonstration, and verbal cues required  HOME EXERCISE PROGRAM: Access Code: [email or web address] URL: [email or web address] Date: 07/19/2024 Prepared by: Glade Pesa  Exercises - Supine Heel Slide  - 2 x daily - 7 x weekly - 2 sets - 10 reps - Supine Quad Set  - 2 x daily - 7 x  weekly - 2 sets - 10 reps - 2 hold - Supine Ankle Pumps  - 2 x daily - 7 x weekly - 3 sets - 10 reps - Hooklying Hamstring Stretch with Strap  -  2 x daily - 7 x weekly - 2 sets - 10 reps - 25 hold - Seated Long Arc Quad  - 2 x daily - 7 x weekly - 2 sets - 10 reps - Supine Active Straight Leg Raise  - 2 x daily - 7 x weekly - 2 sets - 10 reps - Sidelying Hip Abduction  - 1 x daily - 3 x weekly - 2 sets - 10 reps - Sidelying Hip Adduction  - 1 x daily - 3 x weekly - 2 sets - 10 reps - Standing Terminal Knee Extension with Resistance  - 1 x daily - 7 x weekly - 2 sets - 10 reps - Standing Heel Raises  - 1 x daily - 7 x weekly - 2 sets - 10 reps - Sit to Stand  - 1 x daily - 7 x weekly - 1 sets - 10 reps - Long Sitting Hip Internal Rotation stretch  (TFL)  - 2 x daily - 7 x weekly - 1 sets - 3 reps - 30 sec hold - Forward Step Up  - 1 x daily - 7 x weekly - 1 sets - 10 reps - Forward Step Down Touch with Toe  - 1 x daily - 7 x weekly - 1 sets - 10 reps - Standing Isometric Hip Abduction with Ball on Wall (Mirrored)  - 1 x daily - 7 x weekly - 1 sets - 10 reps - Seated Hamstring Curl with Anchored Resistance (Mirrored)  - 1 x daily - 7 x weekly - 1-2 sets - 10 reps - Seated Long Arc Quad with Ankle Weight  - 1 x daily - 7 x weekly - 2 sets - 10 reps - Prone Hamstring Curl with Ankle Weight  - 1 x daily - 7 x weekly - 1-2 sets - 10 reps ASSESSMENT:  CLINICAL IMPRESSION: The patient has met the majority of rehab goals, with noted improvements in pain reduction, outcome score, ROM, strength and functional mobility.  A comprehensive HEP has been established and anticipate further improvements over time with regular performance of the program.  Recommend discharge from PT at this time.   OBJECTIVE IMPAIRMENTS: Abnormal gait, decreased endurance, decreased mobility, difficulty walking, decreased ROM, decreased strength, increased edema, impaired flexibility, and pain.   ACTIVITY LIMITATIONS: sitting, standing, squatting, stairs, and transfers  PARTICIPATION LIMITATIONS: meal prep, driving, community activity, and occupation  PERSONAL FACTORS:  Age are also affecting patient's functional outcome.   REHAB POTENTIAL: Good  CLINICAL DECISION MAKING: Stable/uncomplicated  EVALUATION COMPLEXITY: Moderate   GOALS: Goals reviewed with patient? Yes  SHORT TERM GOALS: Target date: 07/16/2024   Pt to be independent with HEP. Baseline: Goal status:met 10/14  2.  Decrease pain by 1 level. Baseline:  Goal status: met 10/14 3.  Pt able to phase out cane. Baseline:  Goal status:met 10/14  LONG TERM GOALS: Target date: 08/13/2024      Pt to be independent with self progressive HEP by time of discharge. Baseline:  Goal status: met 2.  Increase AROM to [facility] by discharge. Baseline: 4>90 Goal status: MET  3.  Increase strength to at least 4+/5 by discharge. Baseline: 4-/5 Goal status: MET  4.  Max pain to 2/10 or less with all activities. Baseline:  7/10 Goal status: met 10/24  5.  Normalize gait without AD. Baseline:  Goal status: MET 10/21   6.  Pt able to ambulate community distances without pain or AD. Baseline:  Goal status: MET 10/21   PLAN:  PHYSICAL THERAPY DISCHARGE SUMMARY  Visits from Start of Care: 9  Current functional level related to goals / functional outcomes: See clinical impressions above   Remaining deficits: As above   Education / Equipment: HEP   Patient agrees to discharge. Patient goals were met. Patient is being discharged due to meeting the stated rehab goals.  Glade Pesa, PT 07/19/24 1:12 PM Phone: [phone] Fax: [phone]

## 2024-07-23 ENCOUNTER — Encounter: Admitting: Physical Therapy

## 2024-07-25 ENCOUNTER — Encounter: Admitting: Physical Therapy

## 2024-07-29 ENCOUNTER — Encounter: Payer: Self-pay | Admitting: Radiology

## 2024-07-30 ENCOUNTER — Encounter: Admitting: Physical Therapy

## 2024-07-30 ENCOUNTER — Telehealth: Payer: Self-pay | Admitting: Orthopaedic Surgery

## 2024-07-30 NOTE — Telephone Encounter (Signed)
 arthroscopic knee surgery on June 06, 2024   Please advise

## 2024-07-30 NOTE — Telephone Encounter (Signed)
 Sure djo reaction

## 2024-07-30 NOTE — Telephone Encounter (Signed)
 Pt called asking if she could get a left knee brace or knee sleeve  Pt call back number is [phone]

## 2024-07-30 NOTE — Telephone Encounter (Signed)
 Called and scheduled a nurse visit for this Thursday morning.

## 2024-08-01 ENCOUNTER — Ambulatory Visit

## 2024-08-01 ENCOUNTER — Encounter: Admitting: Physical Therapy

## 2024-08-06 ENCOUNTER — Encounter: Admitting: Physical Therapy

## 2024-08-08 ENCOUNTER — Encounter: Admitting: Physical Therapy

## 2024-08-12 ENCOUNTER — Encounter: Admitting: Physical Therapy

## 2024-10-02 ENCOUNTER — Ambulatory Visit: Admitting: Podiatry

## 2024-10-09 ENCOUNTER — Ambulatory Visit: Admitting: Podiatry

## 2024-10-09 DIAGNOSIS — G6181 Chronic inflammatory demyelinating polyneuritis: Secondary | ICD-10-CM | POA: Diagnosis not present

## 2024-10-09 DIAGNOSIS — M7751 Other enthesopathy of right foot: Secondary | ICD-10-CM | POA: Diagnosis not present

## 2024-10-09 DIAGNOSIS — M7752 Other enthesopathy of left foot: Secondary | ICD-10-CM | POA: Diagnosis not present

## 2024-10-09 NOTE — Progress Notes (Signed)
 "  Subjective:  Patient ID: Nancy Christensen, female    DOB: [birth year],  MRN: [id number]  Chief Complaint  Patient presents with   Numbness    Pt stated that she has been having some numbness and tingling pain    74 y.o. female presents with the above complaint.  Presents now with follow-up of bilateral third metatarsophalangeal joint pain.  She states she was doing good for a while on the right foot.  Now both sides are acting up.  She is also getting numbness tingling pain as well.  She wanted to discuss treatment options for all of this  Review of Systems: Negative except as noted in the HPI. Denies N/V/F/Ch.  Past Medical History:  Diagnosis Date   Arthritis 2019   left hip and knee   Atypical chest pain 10/25/2018   Cholecystitis with cholelithiasis 02/11/2018   Chronic right shoulder pain 07/04/2018   Constipation due to outlet dysfunction    Dizziness 10/25/2018   Dyslipidemia 10/25/2018   Essential hypertension 04/28/2018   Changed arb to CCB 04/24/2018 due to cough x one month trial    Hyperlipidemia    Hypertension    Hypertensive disorder 04/18/2018   Multiple pulmonary nodules determined by computed tomography of lung 01/26/2018   Passive smoke exp/ remote  Chest CT  09/24/17 MPN's  Largest 4 x 6 mm R laterally  > rec f/u 09/14/18 (reminder file)   As pt reports neg CT 2015 (not avail)  - ESR 01/25/2018 = 35  - CT chest 07/11/18 =  5 mm > no f/u needed    Primary osteoarthritis of left hip 11/26/2019   Primary osteoarthritis of left knee 03/11/2020   Rectal pain    Sinus bradycardia 10/25/2018   Unilateral primary osteoarthritis, left knee 06/14/2018   Upper airway cough syndrome 01/25/2018   Onset 1980 CT head 12/22/17 neg sinus dz FENO 01/25/2018  =   21 - Allergy  profile 01/25/2018 >  Eos 0.1 /  IgE  17 RAST neg  - flare 02/12/18 p et  - gabapentin  100 mg tid 03/23/2018 > increased to max of 300 tid 04/24/2018 > improved so increased to 300 qid 06/05/2018 > d/c'd around 02/2019 s  flare     Current Outpatient Medications:    azithromycin  (ZITHROMAX ) 500 MG tablet, Take 2 tablets once for severe diarrhea.  For continued symptoms the next day take 1 tablet daily on days 2 and 3. (Patient not taking: Reported on 07/04/2024), Disp: 4 tablet, Rfl: 0   Cyanocobalamin  (B-12 PO), Take by mouth., Disp: , Rfl:    HYDROcodone -acetaminophen  (NORCO/VICODIN) 5-325 MG tablet, Take 1 tablet by mouth 2 (two) times daily as needed., Disp: 14 tablet, Rfl: 0   irbesartan  (AVAPRO ) 150 MG tablet, TAKE 1 TABLET BY MOUTH EVERY DAY, Disp: 90 tablet, Rfl: 1   irbesartan  (AVAPRO ) 75 MG tablet, TAKE ONE TABLETS BY MOUTH DAILY, Disp: 90 tablet, Rfl: 3   ondansetron  (ZOFRAN ) 4 MG tablet, Take 1 tablet (4 mg total) by mouth every 8 (eight) hours as needed for nausea or vomiting., Disp: 40 tablet, Rfl: 0   spironolactone  (ALDACTONE ) 25 MG tablet, Take 1 tablet (25 mg total) by mouth daily., Disp: 90 tablet, Rfl: 3  Social History   Tobacco Use  Smoking Status Never  Smokeless Tobacco Never    No Known Allergies Objective:  There were no vitals filed for this visit. There is no height or weight on file to calculate BMI. Constitutional Well developed.  Well nourished.  Vascular Dorsalis pedis pulses palpable bilaterally. Posterior tibial pulses palpable bilaterally. Capillary refill normal to all digits.  No cyanosis or clubbing noted. Pedal hair growth normal.  Neurologic Normal speech. Oriented to person, place, and time. Decree sensation to light touch.  Decreased protective sensation.  Negative tarsal tunnel or common peroneal nerve syndrome  Dermatologic Nails well groomed and normal in appearance. No open wounds. No skin lesions.  Orthopedic: Pain on palpation lateral third metatarsophalangeal joint pain with range of motion of the joint no acute intra-articular pain noted.   Radiographs: None Assessment:   1. Chronic inflammatory demyelinating polyneuropathy (HCC)    Plan:   Patient was evaluated and treated and all questions answered.  Bilateral third MTP capsulitis with underlying plantar fat pad atrophy -All questions and concerns were discussed with the patient in extensive detail given the pain that she is unsure benefit from steroid injection help decrease inflammatory component associate with pain.  Patient agrees with plan to proceed with steroid injection -A steroid injection was performed at bilateral third MTP using 1% plain Lidocaine  and 10 mg of Kenalog . This was well tolerated.  -Discussed shoe gear modification -She will also benefit from metatarsal pads as well  Peripheral neuropathy - Given the amount of pain that she is experiencing peripheral neuropathy I believe she would benefit from seeing Dr. Lateef for advanced treatment of neuropathy.  Referral was placed. "

## 2024-10-10 ENCOUNTER — Encounter: Payer: Self-pay | Admitting: Podiatry

## 2024-10-18 ENCOUNTER — Telehealth: Payer: Self-pay

## 2024-10-18 NOTE — Telephone Encounter (Signed)
 Holly with the surgical center would like office note before surgery date 06/06/2024 faxed to [phone].  Thank you.

## 2024-10-18 NOTE — Telephone Encounter (Signed)
 Talked with Tinnie MATSU. Concerning office note

## 2024-10-18 NOTE — Telephone Encounter (Signed)
 Faxed as requested

## 2024-11-14 ENCOUNTER — Ambulatory Visit: Admitting: Student in an Organized Health Care Education/Training Program
